# Patient Record
Sex: Female | Born: 1955 | Race: Black or African American | Hispanic: No | Marital: Married | Smoking: Never smoker
Health system: Southern US, Community
[De-identification: ages and names within clinical notes are randomized; demographics above are authoritative.]

## PROBLEM LIST (undated history)

## (undated) DIAGNOSIS — R87619 Unspecified abnormal cytological findings in specimens from cervix uteri: Secondary | ICD-10-CM

## (undated) DIAGNOSIS — M5090 Cervical disc disorder, unspecified, unspecified cervical region: Secondary | ICD-10-CM

## (undated) DIAGNOSIS — I10 Essential (primary) hypertension: Secondary | ICD-10-CM

## (undated) DIAGNOSIS — Z8639 Personal history of other endocrine, nutritional and metabolic disease: Secondary | ICD-10-CM

## (undated) DIAGNOSIS — E559 Vitamin D deficiency, unspecified: Secondary | ICD-10-CM

## (undated) DIAGNOSIS — IMO0002 Reserved for concepts with insufficient information to code with codable children: Secondary | ICD-10-CM

## (undated) DIAGNOSIS — K219 Gastro-esophageal reflux disease without esophagitis: Secondary | ICD-10-CM

## (undated) DIAGNOSIS — G35 Multiple sclerosis: Secondary | ICD-10-CM

## (undated) DIAGNOSIS — J45909 Unspecified asthma, uncomplicated: Secondary | ICD-10-CM

## (undated) DIAGNOSIS — U071 COVID-19: Secondary | ICD-10-CM

## (undated) DIAGNOSIS — Z860100 Personal history of colon polyps, unspecified: Secondary | ICD-10-CM

## (undated) DIAGNOSIS — G35D Multiple sclerosis, unspecified: Secondary | ICD-10-CM

## (undated) HISTORY — DX: COVID-19: U07.1

## (undated) HISTORY — DX: Personal history of colon polyps, unspecified: Z86.0100

## (undated) HISTORY — DX: Vitamin D deficiency, unspecified: E55.9

## (undated) HISTORY — DX: Personal history of colonic polyps: Z86.010

## (undated) HISTORY — DX: Unspecified asthma, uncomplicated: J45.909

## (undated) HISTORY — DX: Essential (primary) hypertension: I10

## (undated) HISTORY — DX: Multiple sclerosis: G35

## (undated) HISTORY — DX: Unspecified abnormal cytological findings in specimens from cervix uteri: R87.619

## (undated) HISTORY — PX: BREAST REDUCTION SURGERY: SHX8

## (undated) HISTORY — DX: Gastro-esophageal reflux disease without esophagitis: K21.9

## (undated) HISTORY — DX: Cervical disc disorder, unspecified, unspecified cervical region: M50.90

## (undated) HISTORY — PX: JOINT REPLACEMENT: SHX530

## (undated) HISTORY — DX: Multiple sclerosis, unspecified: G35.D

## (undated) HISTORY — DX: Personal history of other endocrine, nutritional and metabolic disease: Z86.39

## (undated) HISTORY — DX: Reserved for concepts with insufficient information to code with codable children: IMO0002

---

## 2000-11-20 HISTORY — PX: REDUCTION MAMMAPLASTY: SUR839

## 2001-11-20 HISTORY — PX: TOTAL ABDOMINAL HYSTERECTOMY: SHX209

## 2004-02-26 ENCOUNTER — Encounter: Admission: RE | Admit: 2004-02-26 | Discharge: 2004-02-26 | Payer: Self-pay | Admitting: Internal Medicine

## 2005-01-20 ENCOUNTER — Ambulatory Visit: Payer: Self-pay

## 2005-02-20 ENCOUNTER — Ambulatory Visit: Payer: Self-pay | Admitting: Pain Medicine

## 2005-05-26 ENCOUNTER — Ambulatory Visit: Payer: Self-pay

## 2005-06-28 ENCOUNTER — Ambulatory Visit: Payer: Self-pay

## 2006-01-25 ENCOUNTER — Ambulatory Visit: Payer: Self-pay

## 2006-02-18 ENCOUNTER — Emergency Department: Payer: Self-pay | Admitting: Emergency Medicine

## 2006-05-26 ENCOUNTER — Other Ambulatory Visit: Payer: Self-pay

## 2006-05-26 ENCOUNTER — Emergency Department: Payer: Self-pay | Admitting: Emergency Medicine

## 2006-09-26 ENCOUNTER — Ambulatory Visit: Payer: Self-pay | Admitting: Obstetrics and Gynecology

## 2006-11-03 ENCOUNTER — Inpatient Hospital Stay: Payer: Self-pay | Admitting: Internal Medicine

## 2006-11-23 ENCOUNTER — Ambulatory Visit: Payer: Self-pay

## 2007-10-27 ENCOUNTER — Emergency Department: Payer: Self-pay | Admitting: Emergency Medicine

## 2008-12-30 ENCOUNTER — Ambulatory Visit: Payer: Self-pay | Admitting: Unknown Physician Specialty

## 2009-03-29 LAB — HM PAP SMEAR: HM Pap smear: ABNORMAL

## 2010-02-01 ENCOUNTER — Ambulatory Visit: Payer: Self-pay | Admitting: Unknown Physician Specialty

## 2010-10-22 ENCOUNTER — Ambulatory Visit: Payer: Self-pay | Admitting: Neurology

## 2010-12-31 LAB — HM DEXA SCAN

## 2011-03-03 ENCOUNTER — Observation Stay: Payer: Self-pay | Admitting: *Deleted

## 2011-07-20 ENCOUNTER — Ambulatory Visit: Payer: Self-pay | Admitting: Unknown Physician Specialty

## 2011-07-25 ENCOUNTER — Ambulatory Visit: Payer: Self-pay | Admitting: Unknown Physician Specialty

## 2012-07-09 LAB — TSH: TSH: 1.56 u[IU]/mL (ref <=5.90)

## 2012-07-09 LAB — BASIC METABOLIC PANEL
BUN: 16 mg/dL (ref 4–21)
Creatinine: 0.9 mg/dL (ref <=1.1)
Glucose: 90 mg/dL
Potassium: 4.1 mmol/L (ref 3.4–5.3)
Sodium: 142 mmol/L (ref 137–147)

## 2012-10-03 ENCOUNTER — Telehealth: Payer: Self-pay | Admitting: *Deleted

## 2012-10-03 NOTE — Telephone Encounter (Signed)
Patient has new patient appointment in February 2014 with Dr. Lorin Picket and she requested a new script for Metoprolol XL 25 mg

## 2012-10-03 NOTE — Telephone Encounter (Signed)
Called pharmacy to clarify directions.  Taking Toprol XL 25mg  q day.  Called pharmacy and prescribed #30 with 3 refills.  Please call and notify pt rx has been called in.

## 2012-10-03 NOTE — Telephone Encounter (Signed)
Script faxed to patients pharmacy diovan hct 320-25mg 

## 2012-10-12 ENCOUNTER — Ambulatory Visit: Payer: Self-pay | Admitting: Neurology

## 2012-10-12 LAB — CREATININE, SERUM
Creatinine: 0.82 mg/dL (ref 0.60–1.30)
EGFR (African American): >60
EGFR (Non-African Amer.): >60

## 2012-11-30 ENCOUNTER — Ambulatory Visit: Payer: Self-pay | Admitting: Neurology

## 2012-12-11 ENCOUNTER — Other Ambulatory Visit: Payer: Self-pay | Admitting: Internal Medicine

## 2012-12-11 NOTE — Telephone Encounter (Signed)
Patient has an appointment on 2.7.14   valsartan-hydrochlorothiazide (DIOVAN-HCT) 320-25 MG per tablet

## 2012-12-13 ENCOUNTER — Other Ambulatory Visit: Payer: Self-pay | Admitting: Internal Medicine

## 2012-12-13 MED ORDER — VALSARTAN-HYDROCHLOROTHIAZIDE 320-25 MG PO TABS: 1.0000 | ORAL_TABLET | Freq: Every day | ORAL | Status: DC

## 2012-12-13 NOTE — Telephone Encounter (Signed)
Sent in to pharmacy.  

## 2012-12-16 NOTE — Telephone Encounter (Signed)
Sent in to pharmacy.  

## 2012-12-25 ENCOUNTER — Encounter: Payer: Self-pay | Admitting: Internal Medicine

## 2012-12-25 DIAGNOSIS — E7800 Pure hypercholesterolemia, unspecified: Secondary | ICD-10-CM | POA: Insufficient documentation

## 2012-12-25 DIAGNOSIS — M5137 Other intervertebral disc degeneration, lumbosacral region: Secondary | ICD-10-CM | POA: Insufficient documentation

## 2012-12-25 DIAGNOSIS — I10 Essential (primary) hypertension: Secondary | ICD-10-CM | POA: Insufficient documentation

## 2012-12-25 DIAGNOSIS — E559 Vitamin D deficiency, unspecified: Secondary | ICD-10-CM

## 2012-12-25 DIAGNOSIS — D649 Anemia, unspecified: Secondary | ICD-10-CM | POA: Insufficient documentation

## 2012-12-25 DIAGNOSIS — E785 Hyperlipidemia, unspecified: Secondary | ICD-10-CM

## 2012-12-25 DIAGNOSIS — G35 Multiple sclerosis: Secondary | ICD-10-CM | POA: Insufficient documentation

## 2012-12-27 ENCOUNTER — Ambulatory Visit (INDEPENDENT_AMBULATORY_CARE_PROVIDER_SITE_OTHER): Payer: BC Managed Care – PPO | Admitting: Internal Medicine

## 2012-12-27 ENCOUNTER — Encounter: Payer: Self-pay | Admitting: Internal Medicine

## 2012-12-27 VITALS — BP 120/70 | HR 55 | Temp 99.0°F | Ht >= 80 in | Wt 216.5 lb

## 2012-12-27 DIAGNOSIS — E559 Vitamin D deficiency, unspecified: Secondary | ICD-10-CM

## 2012-12-27 DIAGNOSIS — Z8742 Personal history of other diseases of the female genital tract: Secondary | ICD-10-CM

## 2012-12-27 DIAGNOSIS — D649 Anemia, unspecified: Secondary | ICD-10-CM

## 2012-12-27 DIAGNOSIS — I10 Essential (primary) hypertension: Secondary | ICD-10-CM

## 2012-12-27 DIAGNOSIS — G35 Multiple sclerosis: Secondary | ICD-10-CM

## 2012-12-27 DIAGNOSIS — G35D Multiple sclerosis, unspecified: Secondary | ICD-10-CM

## 2012-12-27 DIAGNOSIS — Z87898 Personal history of other specified conditions: Secondary | ICD-10-CM

## 2012-12-27 DIAGNOSIS — E785 Hyperlipidemia, unspecified: Secondary | ICD-10-CM

## 2012-12-27 DIAGNOSIS — K219 Gastro-esophageal reflux disease without esophagitis: Secondary | ICD-10-CM

## 2012-12-27 MED ORDER — CYCLOBENZAPRINE HCL 5 MG PO TABS: 5.0000 mg | ORAL_TABLET | Freq: Every evening | ORAL | Status: DC | PRN

## 2012-12-29 ENCOUNTER — Encounter: Payer: Self-pay | Admitting: Internal Medicine

## 2012-12-29 DIAGNOSIS — K219 Gastro-esophageal reflux disease without esophagitis: Secondary | ICD-10-CM | POA: Insufficient documentation

## 2012-12-29 DIAGNOSIS — R87619 Unspecified abnormal cytological findings in specimens from cervix uteri: Secondary | ICD-10-CM | POA: Insufficient documentation

## 2012-12-29 NOTE — Progress Notes (Signed)
Subjective:    Patient ID: Jackie White, female    DOB: 12-13-1955, 57 y.o.   MRN: [id number]  HPI 57 year old female with past history of hypertension, hypercholesterolemia, GERD and multiple sclerosis who comes in today for a scheduled follow up.  She wanted to wait until her next visit to do her physical.  She has been seeing Dr Sherryll Burger for her MS.  Had MRI that revealed three lesions on her spine.  She was noticing feeling some unsteadiness at times.  Dr Sherryll Burger discussed changing her treatment regimen.  She declined.  Wants to monitor.   She has been under increased stress with her mother.  She is helping take care of her.  She has noticed some left shoulder tightness and soreness.  Feels like a pulled muscle.  Some tingling down her arm/hand.  Has a pulling sensation - with movement of her arm.  Breathing stable.  Eating and drinking well.  Bowels stable.    Past Medical History  Diagnosis Date  . Multiple sclerosis   . Reactive airway disease   . Hypertension   . Cervical disc disease     Cervical and lumbar disc disease  . Vitamin D deficiency   . History of iron deficiency   . GERD (gastroesophageal reflux disease)   . Abnormal Pap smear   . Personal history of colonic polyps     Current Outpatient Prescriptions on File Prior to Visit  Medication Sig Dispense Refill  . esomeprazole (NEXIUM) 40 MG capsule Take 40 mg by mouth daily.      . Interferon Beta-1b (BETASERON [place]) Inject 0.3 mcg into the skin every other day.      . valsartan-hydrochlorothiazide (DIOVAN-HCT) 320-25 MG per tablet TAKE 1 TABLET BY MOUTH EVERY DAY  30 tablet  0   No current facility-administered medications on file prior to visit.    Review of Systems Patient denies any headache, lightheadedness or dizziness currently.  No sinus or allergy symptoms. No chest pain, tightness or palpitations.  No increased shortness of breath, cough or congestion.  No nausea or vomiting.  No acid reflux.  No abdominal pain or  cramping.  No bowel change, such as diarrhea, constipation, BRBPR or melana.  No urine change.  Some increased pulling in her left shoulder with rotation of her head - right to left.  No motor weakness.         Objective:   Physical Exam Filed Vitals:   12/27/12 1409  BP: 120/70  Pulse: 55  Temp: 99 F (37.2 C)   Pulse 3  57 year old female in no acute distress.   HEENT:  Nares- clear.  Oropharynx - without lesions. NECK:  Supple.  Nontender.  No audible bruit.  HEART:  Appears to be regular. LUNGS:  No crackles or wheezing audible.  Respirations even and unlabored.  RADIAL PULSE:  Equal bilaterally.   ABDOMEN:  Soft, nontender.  Bowel sounds present and normal.  No audible abdominal bruit.    EXTREMITIES:  No increased edema present.  DP pulses palpable and equal bilaterally.  MSK:  Increased discomfort with full extension of her left arm.  Increased discomfort with rotation of there head from left to right.           Assessment & Plan:  MSK.  Left shoulder pain and tingling as outlined.  Treat conservatively.  Stretches.  Flexeril 5mg  q hs prn.  Follow closely.  If any change or problems, needs reevaluation.  HEALTH MAINTENANCE.  Schedule a physical next visit.  Schedule mammogram.  Colonoscopy 11/20/02.

## 2012-12-29 NOTE — Assessment & Plan Note (Signed)
Seeing Dr Sherryll Burger.  See above and his note for details.  Wants to monitor.  Follow.  Continue to follow up with Dr Sherryll Burger.

## 2012-12-29 NOTE — Assessment & Plan Note (Signed)
History of iron deficient anemia.  Check cbc.

## 2012-12-29 NOTE — Assessment & Plan Note (Signed)
States she is up to date.  Will schedule her for a physical next visit.

## 2012-12-29 NOTE — Assessment & Plan Note (Signed)
Check vitamin D level 

## 2012-12-29 NOTE — Assessment & Plan Note (Signed)
Low cholesterol diet and exercise.  Check lipid panel.   

## 2012-12-29 NOTE — Assessment & Plan Note (Signed)
Blood pressure has been under good control.  Same medication regimen.  Follow.  Check metabolic panel.

## 2012-12-29 NOTE — Assessment & Plan Note (Signed)
Reflux controlled.  Follow.   

## 2013-01-08 ENCOUNTER — Telehealth: Payer: Self-pay | Admitting: Internal Medicine

## 2013-01-08 NOTE — Telephone Encounter (Signed)
Dr Lorin Picket When pt was checking out she mention she needed a mammogram.  We need order in system  Thanks  Amber pt would like to go to [place] in pm

## 2013-01-09 NOTE — Telephone Encounter (Signed)
Order placed for her mammogram.

## 2013-02-05 ENCOUNTER — Ambulatory Visit: Payer: Self-pay | Admitting: Internal Medicine

## 2013-02-08 ENCOUNTER — Encounter: Payer: Self-pay | Admitting: Internal Medicine

## 2013-02-12 ENCOUNTER — Other Ambulatory Visit: Payer: Self-pay | Admitting: *Deleted

## 2013-02-17 MED ORDER — VALSARTAN-HYDROCHLOROTHIAZIDE 320-25 MG PO TABS: 1.0000 | ORAL_TABLET | Freq: Every day | ORAL | Status: DC

## 2013-02-17 NOTE — Telephone Encounter (Signed)
Rx sent in to pharmacy. 

## 2013-02-25 ENCOUNTER — Encounter: Payer: Self-pay | Admitting: Internal Medicine

## 2013-03-21 ENCOUNTER — Other Ambulatory Visit: Payer: Self-pay | Admitting: *Deleted

## 2013-03-21 MED ORDER — VALSARTAN-HYDROCHLOROTHIAZIDE 320-25 MG PO TABS: 1.0000 | ORAL_TABLET | Freq: Every day | ORAL | Status: DC

## 2013-03-21 NOTE — Telephone Encounter (Signed)
[facility] left voicemail, requesting refills to be sent. Rx sent to pharmacy by escript

## 2013-04-04 ENCOUNTER — Encounter: Payer: Self-pay | Admitting: Internal Medicine

## 2013-04-04 ENCOUNTER — Other Ambulatory Visit (HOSPITAL_COMMUNITY)
Admission: RE | Admit: 2013-04-04 | Discharge: 2013-04-04 | Disposition: A | Discharge status: Home / Self Care | Payer: BC Managed Care – PPO | Source: Ambulatory Visit | Attending: Internal Medicine | Admitting: Internal Medicine

## 2013-04-04 ENCOUNTER — Ambulatory Visit (INDEPENDENT_AMBULATORY_CARE_PROVIDER_SITE_OTHER): Payer: BC Managed Care – PPO | Admitting: Internal Medicine

## 2013-04-04 VITALS — BP 100/70 | HR 76 | Temp 99.2°F | Ht 67.1 in | Wt 213.8 lb

## 2013-04-04 DIAGNOSIS — E785 Hyperlipidemia, unspecified: Secondary | ICD-10-CM

## 2013-04-04 DIAGNOSIS — Z1151 Encounter for screening for human papillomavirus (HPV): Secondary | ICD-10-CM | POA: Insufficient documentation

## 2013-04-04 DIAGNOSIS — D649 Anemia, unspecified: Secondary | ICD-10-CM

## 2013-04-04 DIAGNOSIS — Z124 Encounter for screening for malignant neoplasm of cervix: Secondary | ICD-10-CM

## 2013-04-04 DIAGNOSIS — Z87898 Personal history of other specified conditions: Secondary | ICD-10-CM

## 2013-04-04 DIAGNOSIS — G35 Multiple sclerosis: Secondary | ICD-10-CM

## 2013-04-04 DIAGNOSIS — E559 Vitamin D deficiency, unspecified: Secondary | ICD-10-CM

## 2013-04-04 DIAGNOSIS — Z8742 Personal history of other diseases of the female genital tract: Secondary | ICD-10-CM

## 2013-04-04 DIAGNOSIS — K219 Gastro-esophageal reflux disease without esophagitis: Secondary | ICD-10-CM

## 2013-04-04 DIAGNOSIS — Z1211 Encounter for screening for malignant neoplasm of colon: Secondary | ICD-10-CM

## 2013-04-04 DIAGNOSIS — Z01419 Encounter for gynecological examination (general) (routine) without abnormal findings: Secondary | ICD-10-CM | POA: Insufficient documentation

## 2013-04-04 DIAGNOSIS — I10 Essential (primary) hypertension: Secondary | ICD-10-CM

## 2013-04-04 LAB — HEPATIC FUNCTION PANEL
Albumin: 3.8 g/dL (ref 3.5–5.2)
Bilirubin, Direct: 0 mg/dL (ref 0.0–0.3)
Total Protein: 6.9 g/dL (ref 6.0–8.3)

## 2013-04-04 LAB — BASIC METABOLIC PANEL
BUN: 13 mg/dL (ref 6–23)
CO2: 32 mEq/L (ref 19–32)
Calcium: 9.5 mg/dL (ref 8.4–10.5)
Chloride: 100 mEq/L (ref 96–112)
Creatinine, Ser: 0.8 mg/dL (ref 0.4–1.2)
Glucose, Bld: 87 mg/dL (ref 70–99)
Potassium: 3.8 mEq/L (ref 3.5–5.1)

## 2013-04-04 LAB — CBC WITH DIFFERENTIAL/PLATELET
Basophils Relative: 0.6 % (ref 0.0–3.0)
Eosinophils Relative: 1.5 % (ref 0.0–5.0)
HCT: 40.5 % (ref 36.0–46.0)
Lymphocytes Relative: 24.8 % (ref 12.0–46.0)
Monocytes Relative: 15.6 % — ABNORMAL HIGH (ref 3.0–12.0)
Neutro Abs: 2.3 10*3/uL (ref 1.4–7.7)
Neutrophils Relative %: 57.5 % (ref 43.0–77.0)
Platelets: 257 10*3/uL (ref 150.0–400.0)
RDW: 13.8 % (ref 11.5–14.6)

## 2013-04-04 MED ORDER — ESOMEPRAZOLE MAGNESIUM 40 MG PO CPDR: 40.0000 mg | DELAYED_RELEASE_CAPSULE | Freq: Every day | ORAL | Status: DC

## 2013-04-04 NOTE — Progress Notes (Signed)
Subjective:    Patient ID: Jackie White, female    DOB: [birth year], 57 y.o.   MRN: [id number]  HPI 57 year old female with past history of hypertension, hypercholesterolemia, GERD and multiple sclerosis who comes in today to follow up on these issues as well as for a complete physical exam.  She has been seeing Dr Sherryll Burger for her MS.  Had MRI that revealed three lesions on her spine.  She was just reevaluated in 2/14.  States everything stable.  She has been under increased stress with her mother.  She is helping take care of her.  Feels she is handling things relatively well.  Nexium controlling her reflux.   Breathing stable.  Eating and drinking well.  Bowels stable.    Past Medical History  Diagnosis Date  . Multiple sclerosis   . Reactive airway disease   . Hypertension   . Cervical disc disease     Cervical and lumbar disc disease  . Vitamin D deficiency   . History of iron deficiency   . GERD (gastroesophageal reflux disease)   . Abnormal Pap smear   . Personal history of colonic polyps     Current Outpatient Prescriptions on File Prior to Visit  Medication Sig Dispense Refill  . esomeprazole (NEXIUM) 40 MG capsule Take 40 mg by mouth daily.      . Interferon Beta-1b (BETASERON [place]) Inject 0.3 mcg into the skin every other day.      . metoprolol succinate (TOPROL-XL) 25 MG 24 hr tablet Take 25 mg by mouth daily.      . valsartan-hydrochlorothiazide (DIOVAN-HCT) 320-25 MG per tablet Take 1 tablet by mouth daily.  30 tablet  5   No current facility-administered medications on file prior to visit.    Review of Systems Patient denies any headache, lightheadedness or dizziness currently.  No sinus or allergy symptoms. No chest pain, tightness or palpitations.  No increased shortness of breath, cough or congestion.  No nausea or vomiting.  No acid reflux.  Controlled on nexium. No abdominal pain or cramping.  No bowel change, such as diarrhea, constipation, BRBPR or melana.  No urine  change.  Taking vitamin D supplementation.          Objective:   Physical Exam  Filed Vitals:   04/04/13 0827  BP: 100/70  Pulse: 76  Temp: 99.2 F (37.3 C)   Pulse 64, blood pressure recheck:  77/83-4  57 year old female in no acute distress.   HEENT:  Nares- clear.  Oropharynx - without lesions. NECK:  Supple.  Nontender.  No audible bruit.  HEART:  Appears to be regular. LUNGS:  No crackles or wheezing audible.  Respirations even and unlabored.  RADIAL PULSE:  Equal bilaterally.    BREASTS:  No nipple discharge or nipple retraction present.  Could not appreciate any distinct nodules or axillary adenopathy.  ABDOMEN:  Soft, nontender.  Bowel sounds present and normal.  No audible abdominal bruit.  GU:  Normal external genitalia.  Vaginal vault without lesions.  S/p hysterectomy.  Pap performed. Could not appreciate any adnexal masses or tenderness.   RECTAL:  Heme negative.   EXTREMITIES:  No increased edema present.  DP pulses palpable and equal bilaterally.          Assessment & Plan:  MSK.  Stable.    HEALTH MAINTENANCE.  Physical today.  Mammogram 02/05/13 - Birads I.   Colonoscopy 11/20/02.  Refer to GI for screening colonoscopy.  Due.

## 2013-04-06 ENCOUNTER — Encounter: Payer: Self-pay | Admitting: Internal Medicine

## 2013-04-06 NOTE — Assessment & Plan Note (Signed)
Blood pressure has been under good control.  Same medication regimen.  Follow.  Follow metabolic panel.   

## 2013-04-06 NOTE — Assessment & Plan Note (Signed)
Low cholesterol diet and exercise.  Follow lipid panel.   

## 2013-04-06 NOTE — Assessment & Plan Note (Addendum)
Reflux controlled on nexium.  Follow.   

## 2013-04-06 NOTE — Assessment & Plan Note (Addendum)
Seeing Dr Sherryll Burger.  See above and his note for details.  Follow.  Continue to follow up with Dr Sherryll Burger.  She feels things are stable.

## 2013-04-06 NOTE — Assessment & Plan Note (Signed)
Physical today.  Pap performed.

## 2013-04-06 NOTE — Assessment & Plan Note (Signed)
On vitamin D supplements.  Follow.   

## 2013-04-06 NOTE — Assessment & Plan Note (Signed)
History of iron deficient anemia.  Check cbc.

## 2013-04-08 ENCOUNTER — Encounter: Payer: Self-pay | Admitting: Internal Medicine

## 2013-04-09 ENCOUNTER — Telehealth: Payer: Self-pay | Admitting: Internal Medicine

## 2013-04-09 NOTE — Telephone Encounter (Signed)
Pt sent me a message wanting to be seen next week - early am.  See if she can come in on 04/18/13 at 8:15.

## 2013-04-09 NOTE — Telephone Encounter (Signed)
Received Prior Authorization Criteria question for nexium, put in Dr. Roby Lofts folder

## 2013-04-09 NOTE — Telephone Encounter (Signed)
Called [phone] for Prior Authorization on Nexium 40mg , form is being faxed over  Case number: [id number]

## 2013-04-09 NOTE — Telephone Encounter (Signed)
Left message for pt to call office

## 2013-04-10 NOTE — Telephone Encounter (Signed)
Left message for pt to call office on home phone.  Cell phone had different name didn't leave message there

## 2013-04-18 ENCOUNTER — Encounter: Payer: Self-pay | Admitting: Internal Medicine

## 2013-04-18 ENCOUNTER — Ambulatory Visit (INDEPENDENT_AMBULATORY_CARE_PROVIDER_SITE_OTHER): Payer: BC Managed Care – PPO | Admitting: Internal Medicine

## 2013-04-18 VITALS — BP 120/76 | HR 60 | Temp 98.7°F | Ht 67.1 in | Wt 218.5 lb

## 2013-04-18 DIAGNOSIS — L989 Disorder of the skin and subcutaneous tissue, unspecified: Secondary | ICD-10-CM

## 2013-04-18 DIAGNOSIS — K219 Gastro-esophageal reflux disease without esophagitis: Secondary | ICD-10-CM

## 2013-04-18 NOTE — Progress Notes (Signed)
  Subjective:    Patient ID: Jackie White, female    DOB: 1956/06/25, 57 y.o.   MRN: [id number]  HPI 57 year old female with past history of hypertension, hypercholesterolemia, GERD and multiple sclerosis who comes in today as a work in with concerns regarding a persistent arm lesion.  Has been present for at least two months.  Raised.  Was itching.  She applied hydrocortisone.  Helped the itching.  She has multiple small areas of pigment change, but this area is raised.  No longer itching or inflammed.     Past Medical History  Diagnosis Date  . Multiple sclerosis   . Reactive airway disease   . Hypertension   . Cervical disc disease     Cervical and lumbar disc disease  . Vitamin D deficiency   . History of iron deficiency   . GERD (gastroesophageal reflux disease)   . Abnormal Pap smear   . Personal history of colonic polyps     Current Outpatient Prescriptions on File Prior to Visit  Medication Sig Dispense Refill  . Interferon Beta-1b (BETASERON [place]) Inject 0.3 mcg into the skin every other day.      . metoprolol succinate (TOPROL-XL) 25 MG 24 hr tablet Take 25 mg by mouth daily.      . valsartan-hydrochlorothiazide (DIOVAN-HCT) 320-25 MG per tablet Take 1 tablet by mouth daily.  30 tablet  5  . esomeprazole (NEXIUM) 40 MG capsule Take 1 capsule (40 mg total) by mouth daily.  90 capsule  3   No current facility-administered medications on file prior to visit.    Review of Systems Arm lesion as outlined.  Persistent.  No significant itching or inflammation today.  No increased redness.  No pain.  Had some questions about her nexium - prior authorization.         Objective:   Physical Exam  Filed Vitals:   04/18/13 0809  BP: 120/76  Pulse: 60  Temp: 98.7 F (53.29 C)   57 year old female in no acute distress.  NECK:  Supple.  Nontender.  HEART:  Appears to be regular. LUNGS:  No crackles or wheezing audible.  Respirations even and unlabored.    SKIN:  Small circular  raised lesion - right upper arm.  Non tender.  No increased erythema or warmth.         Assessment & Plan:  DERMATOLOGY.  Persistent right arm lesion.  Refer to dermatology for evaluation.   HEALTH MAINTENANCE.  Physical last visit.  Mammogram 02/05/13 - Birads I.   Colonoscopy 11/20/02.  Referred to GI for screening colonoscopy.

## 2013-04-20 ENCOUNTER — Encounter: Payer: Self-pay | Admitting: Internal Medicine

## 2013-04-20 NOTE — Assessment & Plan Note (Signed)
Reflux controlled on nexium.  Has tried prilosec previously.  Did not work as well.  Continue on nexium since symptoms controlled.   

## 2013-04-22 ENCOUNTER — Encounter: Payer: Self-pay | Admitting: Emergency Medicine

## 2013-04-24 ENCOUNTER — Telehealth: Payer: Self-pay | Admitting: *Deleted

## 2013-04-24 NOTE — Telephone Encounter (Signed)
error 

## 2013-05-08 ENCOUNTER — Telehealth: Payer: Self-pay | Admitting: *Deleted

## 2013-05-08 ENCOUNTER — Encounter: Payer: Self-pay | Admitting: Internal Medicine

## 2013-05-08 MED ORDER — ESOMEPRAZOLE MAGNESIUM 20 MG PO PACK: 20.0000 mg | PACK | Freq: Every day | ORAL | Status: DC

## 2013-05-08 NOTE — Telephone Encounter (Signed)
Rx received letter that insurance will now cover the Nexium OTC #42 with a written RX at a cost of only $5. Pt would like to pick up RX (in your folder)

## 2013-05-12 NOTE — Telephone Encounter (Signed)
Rx placed up front-left message on home # to notify pt

## 2013-05-12 NOTE — Telephone Encounter (Signed)
rx signed and in your box.

## 2013-07-10 ENCOUNTER — Other Ambulatory Visit: Payer: Self-pay | Admitting: Internal Medicine

## 2013-09-05 ENCOUNTER — Ambulatory Visit: Payer: Self-pay | Admitting: Gastroenterology

## 2013-09-05 LAB — HM COLONOSCOPY

## 2013-09-17 ENCOUNTER — Encounter: Payer: Self-pay | Admitting: Internal Medicine

## 2013-09-19 ENCOUNTER — Other Ambulatory Visit: Payer: Self-pay | Admitting: Internal Medicine

## 2013-09-25 ENCOUNTER — Other Ambulatory Visit: Payer: Self-pay

## 2013-10-08 ENCOUNTER — Encounter: Payer: Self-pay | Admitting: Internal Medicine

## 2013-10-08 ENCOUNTER — Ambulatory Visit (INDEPENDENT_AMBULATORY_CARE_PROVIDER_SITE_OTHER): Payer: BC Managed Care – PPO | Admitting: Internal Medicine

## 2013-10-08 ENCOUNTER — Ambulatory Visit (INDEPENDENT_AMBULATORY_CARE_PROVIDER_SITE_OTHER)
Admission: RE | Admit: 2013-10-08 | Discharge: 2013-10-08 | Disposition: A | Discharge status: Home / Self Care | Payer: BC Managed Care – PPO | Source: Ambulatory Visit | Attending: Internal Medicine | Admitting: Internal Medicine

## 2013-10-08 VITALS — BP 110/80 | HR 59 | Temp 98.6°F | Ht 67.1 in | Wt 216.0 lb

## 2013-10-08 DIAGNOSIS — M25522 Pain in left elbow: Secondary | ICD-10-CM

## 2013-10-08 DIAGNOSIS — M25562 Pain in left knee: Secondary | ICD-10-CM

## 2013-10-08 DIAGNOSIS — Z13220 Encounter for screening for lipoid disorders: Secondary | ICD-10-CM

## 2013-10-08 DIAGNOSIS — Z87898 Personal history of other specified conditions: Secondary | ICD-10-CM

## 2013-10-08 DIAGNOSIS — I10 Essential (primary) hypertension: Secondary | ICD-10-CM

## 2013-10-08 DIAGNOSIS — M25529 Pain in unspecified elbow: Secondary | ICD-10-CM

## 2013-10-08 DIAGNOSIS — K219 Gastro-esophageal reflux disease without esophagitis: Secondary | ICD-10-CM

## 2013-10-08 DIAGNOSIS — M25569 Pain in unspecified knee: Secondary | ICD-10-CM

## 2013-10-08 DIAGNOSIS — E785 Hyperlipidemia, unspecified: Secondary | ICD-10-CM

## 2013-10-08 DIAGNOSIS — E559 Vitamin D deficiency, unspecified: Secondary | ICD-10-CM

## 2013-10-08 DIAGNOSIS — Z8742 Personal history of other diseases of the female genital tract: Secondary | ICD-10-CM

## 2013-10-08 DIAGNOSIS — D649 Anemia, unspecified: Secondary | ICD-10-CM

## 2013-10-08 DIAGNOSIS — G35 Multiple sclerosis: Secondary | ICD-10-CM

## 2013-10-08 LAB — LIPID PANEL
Cholesterol: 157 mg/dL (ref 0–200)
LDL Cholesterol: 82 mg/dL (ref 0–99)
Triglycerides: 73 mg/dL (ref 0.0–149.0)
VLDL: 14.6 mg/dL (ref 0.0–40.0)

## 2013-10-08 LAB — COMPREHENSIVE METABOLIC PANEL
ALT: 17 U/L (ref 0–35)
AST: 18 U/L (ref 0–37)
Alkaline Phosphatase: 70 U/L (ref 39–117)
CO2: 29 mEq/L (ref 19–32)
Creatinine, Ser: 0.8 mg/dL (ref 0.4–1.2)
Glucose, Bld: 91 mg/dL (ref 70–99)
Sodium: 138 mEq/L (ref 135–145)
Total Bilirubin: 1 mg/dL (ref 0.3–1.2)

## 2013-10-08 LAB — CBC WITH DIFFERENTIAL/PLATELET
Basophils Absolute: 0 10*3/uL (ref 0.0–0.1)
HCT: 40.9 % (ref 36.0–46.0)
Hemoglobin: 13.5 g/dL (ref 12.0–15.0)
Lymphocytes Relative: 22 % (ref 12.0–46.0)
Lymphs Abs: 0.8 10*3/uL (ref 0.7–4.0)
MCV: 83 fl (ref 78.0–100.0)
Monocytes Absolute: 0.7 10*3/uL (ref 0.1–1.0)
Neutro Abs: 2 10*3/uL (ref 1.4–7.7)
WBC: 3.6 10*3/uL — ABNORMAL LOW (ref 4.5–10.5)

## 2013-10-08 NOTE — Progress Notes (Signed)
Subjective:    Patient ID: Jackie White, female    DOB: [birth year], 57 y.o.   MRN: [id number]  HPI 57 year old female with past history of hypertension, hypercholesterolemia, GERD and multiple sclerosis who comes in today for a scheduled follow up.  She reports she previously had trouble with left knee pain.  Had previous arthroscopy and found to have a torn meniscus.  Had done relatively well.  Started working out.  States that starting a few weeks ago she developed increased pain in her knee.  She has been icing and elevating the knee.  Feels stiff.  Increased pressure with walking.  Taking some advil and alleve.  Helps some.  She also reports some increased left elbow soreness.  Stiff in the am.  No cardiac symptoms with increased activity or exertion.  Breathing doing well.  Eating and drinking well.  Bowels stable.       Past Medical History  Diagnosis Date  . Multiple sclerosis   . Reactive airway disease   . Hypertension   . Cervical disc disease     Cervical and lumbar disc disease  . Vitamin D deficiency   . History of iron deficiency   . GERD (gastroesophageal reflux disease)   . Abnormal Pap smear   . Personal history of colonic polyps     Current Outpatient Prescriptions on File Prior to Visit  Medication Sig Dispense Refill  . esomeprazole (NEXIUM) 20 MG packet Take 20 mg by mouth daily before breakfast.  42 each  12  . Interferon Beta-1b (BETASERON [place]) Inject 0.3 mcg into the skin every other day.      . metoprolol succinate (TOPROL-XL) 25 MG 24 hr tablet TAKE 1 TABLET BY MOUTH DAILY  90 tablet  1  . valsartan-hydrochlorothiazide (DIOVAN-HCT) 320-25 MG per tablet TAKE 1 TABLET BY MOUTH EVERY DAY  30 tablet  5   No current facility-administered medications on file prior to visit.    Review of Systems Patient denies any headache, lightheadedness or dizziness.  No sinus or allergy symptoms.  No chest pain, tightness or palpitations.  No increased shortness of breath,  cough or congestion.  No nausea or vomiting.  No abdominal pain or cramping.  No bowel change, such as diarrhea, constipation, BRBPR or melana.  No urine change.  Left knee pain as outlined.           Objective:   Physical Exam  Filed Vitals:   10/08/13 0830  BP: 110/80  Pulse: 59  Temp: 98.6 F (61 C)   57 year old female in no acute distress.   HEENT:  Nares- clear.  Oropharynx - without lesions. NECK:  Supple.  Nontender.  No audible bruit.  HEART:  Appears to be regular. LUNGS:  No crackles or wheezing audible.  Respirations even and unlabored.  RADIAL PULSE:  Equal bilaterally.   ABDOMEN:  Soft, nontender.  Bowel sounds present and normal.  No audible abdominal bruit.   EXTREMITIES:  No increased edema present.  DP pulses palpable and equal bilaterally.   MSK:  Increased pain - left elbow with rotation of her forearm.  Increased pain to palpation just under the patella and bilateral - adjacent to the patella.  No increased soft tissue swelling and erythema.             Assessment & Plan:  HEALTH MAINTENANCE.  Physical 04/04/13.   Pap 04/04/13 - negative with negative HPV.  Mammogram 02/05/13 - Birads I.   Colonoscopy  11/20/02.  Referred to GI for screening colonoscopy.

## 2013-10-08 NOTE — Progress Notes (Signed)
Pre-visit discussion using our clinic review tool. No additional management support is needed unless otherwise documented below in the visit note.  

## 2013-10-09 ENCOUNTER — Telehealth: Payer: Self-pay | Admitting: Internal Medicine

## 2013-10-09 ENCOUNTER — Encounter: Payer: Self-pay | Admitting: Internal Medicine

## 2013-10-09 DIAGNOSIS — M25551 Pain in right hip: Secondary | ICD-10-CM

## 2013-10-09 DIAGNOSIS — D72819 Decreased white blood cell count, unspecified: Secondary | ICD-10-CM

## 2013-10-09 LAB — VITAMIN D 25 HYDROXY (VIT D DEFICIENCY, FRACTURES): Vit D, 25-Hydroxy: 43 ng/mL (ref 30–89)

## 2013-10-09 NOTE — Telephone Encounter (Signed)
Pt notified of lab results via my chart.  Needs a f/u non fasting lab in 4 weeks.  Please schedule the lab appt and contact her with an appt date and time.  Thanks.

## 2013-10-09 NOTE — Telephone Encounter (Signed)
Order placed for referral to physical therapy.  

## 2013-10-10 NOTE — Telephone Encounter (Signed)
Left message for pt to call office

## 2013-10-12 ENCOUNTER — Encounter: Payer: Self-pay | Admitting: Internal Medicine

## 2013-10-12 DIAGNOSIS — M25569 Pain in unspecified knee: Secondary | ICD-10-CM | POA: Insufficient documentation

## 2013-10-12 DIAGNOSIS — M25562 Pain in left knee: Secondary | ICD-10-CM | POA: Insufficient documentation

## 2013-10-12 NOTE — Assessment & Plan Note (Signed)
Knee pain and exam as outlined.  Check xray.  Further w/up pending.  Taking antiinflammatories.  Helping some.

## 2013-10-12 NOTE — Assessment & Plan Note (Signed)
Low cholesterol diet and exercise.  Follow lipid panel.   

## 2013-10-12 NOTE — Assessment & Plan Note (Signed)
Blood pressure has been under good control.  Same medication regimen.  Follow.  Follow metabolic panel.   

## 2013-10-12 NOTE — Assessment & Plan Note (Signed)
Elbow pain as outlined.  Taking antiinflammatories.  Use elbow strap.  Follow.  Will notify me if persistent problems.  May need injection.

## 2013-10-12 NOTE — Assessment & Plan Note (Signed)
On vitamin D supplements.  Follow.   

## 2013-10-12 NOTE — Assessment & Plan Note (Signed)
Seeing Dr Shah.  See his note for details.  Follow.  Continue to follow up with Dr Shah.  She feels things are stable.   

## 2013-10-12 NOTE — Assessment & Plan Note (Signed)
History of iron deficient anemia.  Follow cbc.   

## 2013-10-12 NOTE — Assessment & Plan Note (Signed)
Reflux controlled on nexium.  Has tried prilosec previously.  Did not work as well.  Continue on nexium since symptoms controlled.   

## 2013-10-12 NOTE — Assessment & Plan Note (Addendum)
Physical 04/04/13.  Pap negative with negative HPV.

## 2013-10-14 NOTE — Telephone Encounter (Signed)
apointment 1/5 pt aware

## 2013-10-20 ENCOUNTER — Encounter: Payer: Self-pay | Admitting: Emergency Medicine

## 2013-11-24 ENCOUNTER — Other Ambulatory Visit (INDEPENDENT_AMBULATORY_CARE_PROVIDER_SITE_OTHER): Payer: BC Managed Care – PPO

## 2013-11-24 DIAGNOSIS — D72819 Decreased white blood cell count, unspecified: Secondary | ICD-10-CM

## 2013-11-24 LAB — CBC WITH DIFFERENTIAL/PLATELET
Basophils Absolute: 0 10*3/uL (ref 0.0–0.1)
Basophils Relative: 0.6 % (ref 0.0–3.0)
EOS PCT: 3 % (ref 0.0–5.0)
Eosinophils Absolute: 0.1 10*3/uL (ref 0.0–0.7)
HCT: 37.5 % (ref 36.0–46.0)
Hemoglobin: 12.2 g/dL (ref 12.0–15.0)
Lymphocytes Relative: 36.5 % (ref 12.0–46.0)
Lymphs Abs: 1.3 10*3/uL (ref 0.7–4.0)
MCHC: 32.7 g/dL (ref 30.0–36.0)
MCV: 84 fl (ref 78.0–100.0)
MONO ABS: 0.5 10*3/uL (ref 0.1–1.0)
MONOS PCT: 14.3 % — AB (ref 3.0–12.0)
NEUTROS PCT: 45.6 % (ref 43.0–77.0)
Neutro Abs: 1.7 10*3/uL (ref 1.4–7.7)
Platelets: 264 10*3/uL (ref 150.0–400.0)
RBC: 4.47 Mil/uL (ref 3.87–5.11)
RDW: 14.6 % (ref 11.5–14.6)
WBC: 3.6 10*3/uL — AB (ref 4.5–10.5)

## 2013-11-25 ENCOUNTER — Encounter: Payer: Self-pay | Admitting: Internal Medicine

## 2013-12-27 ENCOUNTER — Other Ambulatory Visit: Payer: Self-pay | Admitting: Internal Medicine

## 2014-01-13 ENCOUNTER — Ambulatory Visit: Payer: BC Managed Care – PPO | Admitting: Internal Medicine

## 2014-02-16 ENCOUNTER — Encounter: Payer: Self-pay | Admitting: Internal Medicine

## 2014-02-16 ENCOUNTER — Ambulatory Visit (INDEPENDENT_AMBULATORY_CARE_PROVIDER_SITE_OTHER): Payer: BC Managed Care – PPO | Admitting: Internal Medicine

## 2014-02-16 VITALS — BP 126/80 | HR 60 | Temp 98.2°F | Resp 16 | Ht 67.1 in | Wt 215.0 lb

## 2014-02-16 DIAGNOSIS — Z1211 Encounter for screening for malignant neoplasm of colon: Secondary | ICD-10-CM

## 2014-02-16 DIAGNOSIS — I10 Essential (primary) hypertension: Secondary | ICD-10-CM

## 2014-02-16 DIAGNOSIS — D649 Anemia, unspecified: Secondary | ICD-10-CM

## 2014-02-16 DIAGNOSIS — Z1239 Encounter for other screening for malignant neoplasm of breast: Secondary | ICD-10-CM

## 2014-02-16 DIAGNOSIS — G35 Multiple sclerosis: Secondary | ICD-10-CM

## 2014-02-16 DIAGNOSIS — E785 Hyperlipidemia, unspecified: Secondary | ICD-10-CM

## 2014-02-16 DIAGNOSIS — K219 Gastro-esophageal reflux disease without esophagitis: Secondary | ICD-10-CM

## 2014-02-16 DIAGNOSIS — E559 Vitamin D deficiency, unspecified: Secondary | ICD-10-CM

## 2014-02-16 NOTE — Progress Notes (Signed)
  Subjective:    Patient ID: Jackie White, female    DOB: 1956-05-10, 58 y.o.   MRN: [id number]  HPI 58 year old female with past history of hypertension, hypercholesterolemia, GERD and multiple sclerosis who comes in today for a scheduled follow up.  No cardiac symptoms with increased activity or exertion.  Trying to stay active.  Breathing doing well.  Eating and drinking well.  Bowels stable.  Sees Dr Sherryll Burger for her MS.  Felt things were stable.  Recommended f/u in one year.  Overall she feels she is doing well.     Past Medical History  Diagnosis Date  . Multiple sclerosis   . Reactive airway disease   . Hypertension   . Cervical disc disease     Cervical and lumbar disc disease  . Vitamin D deficiency   . History of iron deficiency   . GERD (gastroesophageal reflux disease)   . Abnormal Pap smear   . Personal history of colonic polyps     Current Outpatient Prescriptions on File Prior to Visit  Medication Sig Dispense Refill  . esomeprazole (NEXIUM) 20 MG packet Take 20 mg by mouth daily before breakfast.  42 each  12  . Interferon Beta-1b (BETASERON [place]) Inject 0.3 mcg into the skin every other day.      . metoprolol succinate (TOPROL-XL) 25 MG 24 hr tablet TAKE 1 TABLET BY MOUTH EVERY DAY  90 tablet  0  . valsartan-hydrochlorothiazide (DIOVAN-HCT) 320-25 MG per tablet TAKE 1 TABLET BY MOUTH EVERY DAY  30 tablet  5   No current facility-administered medications on file prior to visit.    Review of Systems Patient denies any headache, lightheadedness or dizziness.  No sinus or allergy symptoms.  No chest pain, tightness or palpitations.  No increased shortness of breath, cough or congestion.  No nausea or vomiting.  No abdominal pain or cramping.  No bowel change, such as diarrhea, constipation, BRBPR or melana.  No urine change.  States due for a colonoscopy.             Objective:   Physical Exam  Filed Vitals:   02/16/14 1102  BP: 126/80  Pulse: 60  Temp: 98.2 F (36.8  C)  Resp: 88   58 year old female in no acute distress.   HEENT:  Nares- clear.  Oropharynx - without lesions. NECK:  Supple.  Nontender.  No audible bruit.  HEART:  Appears to be regular. LUNGS:  No crackles or wheezing audible.  Respirations even and unlabored.  RADIAL PULSE:  Equal bilaterally.   ABDOMEN:  Soft, nontender.  Bowel sounds present and normal.  No audible abdominal bruit.   EXTREMITIES:  No increased edema present.  DP pulses palpable and equal bilaterally.            Assessment & Plan:  HEALTH MAINTENANCE.  Physical 04/04/13.   Pap 04/04/13 - negative with negative HPV.  Mammogram 02/05/13 - Birads I. Schedule a f/u mammogram.   Colonoscopy 11/20/02.  Refer to GI for screening colonoscopy.  Wants to see Dr Bluford Kaufmann.

## 2014-02-16 NOTE — Progress Notes (Signed)
Pre visit review using our clinic review tool, if applicable. No additional management support is needed unless otherwise documented below in the visit note. 

## 2014-02-20 ENCOUNTER — Encounter: Payer: Self-pay | Admitting: Internal Medicine

## 2014-02-20 NOTE — Assessment & Plan Note (Signed)
Physical 04/04/13.  Pap negative with negative HPV.  Sees Dr Dorma Russell.

## 2014-02-20 NOTE — Assessment & Plan Note (Signed)
Blood pressure has been under good control.  Same medication regimen.  Follow.  Follow metabolic panel.   

## 2014-02-20 NOTE — Assessment & Plan Note (Signed)
On vitamin D supplements.  Follow.   

## 2014-02-20 NOTE — Assessment & Plan Note (Signed)
Seeing Dr Sherryll Burger.  See his note for details.  Follow.  Continue to follow up with Dr Sherryll Burger.  She feels things are stable.

## 2014-02-20 NOTE — Assessment & Plan Note (Signed)
History of iron deficient anemia.  Follow cbc.

## 2014-02-20 NOTE — Assessment & Plan Note (Signed)
Low cholesterol diet and exercise.  Follow lipid panel.   

## 2014-02-20 NOTE — Assessment & Plan Note (Signed)
Reflux controlled on nexium.  Has tried prilosec previously.  Did not work as well.  Continue on nexium since symptoms controlled.

## 2014-03-13 ENCOUNTER — Other Ambulatory Visit: Payer: Self-pay | Admitting: Internal Medicine

## 2014-03-31 ENCOUNTER — Ambulatory Visit: Payer: Self-pay | Admitting: Neurology

## 2014-04-03 ENCOUNTER — Other Ambulatory Visit: Payer: Self-pay | Admitting: Internal Medicine

## 2014-04-29 ENCOUNTER — Encounter: Payer: BC Managed Care – PPO | Admitting: Internal Medicine

## 2014-05-25 ENCOUNTER — Other Ambulatory Visit: Payer: Self-pay | Admitting: Internal Medicine

## 2014-06-27 ENCOUNTER — Other Ambulatory Visit: Payer: Self-pay | Admitting: Internal Medicine

## 2014-07-28 ENCOUNTER — Ambulatory Visit (INDEPENDENT_AMBULATORY_CARE_PROVIDER_SITE_OTHER): Payer: BC Managed Care – PPO | Admitting: Internal Medicine

## 2014-07-28 ENCOUNTER — Encounter: Payer: Self-pay | Admitting: Internal Medicine

## 2014-07-28 ENCOUNTER — Other Ambulatory Visit: Payer: Self-pay | Admitting: Internal Medicine

## 2014-07-28 VITALS — BP 120/70 | HR 72 | Temp 98.3°F | Ht 67.5 in | Wt 226.0 lb

## 2014-07-28 DIAGNOSIS — R7989 Other specified abnormal findings of blood chemistry: Secondary | ICD-10-CM

## 2014-07-28 DIAGNOSIS — I10 Essential (primary) hypertension: Secondary | ICD-10-CM

## 2014-07-28 DIAGNOSIS — E785 Hyperlipidemia, unspecified: Secondary | ICD-10-CM

## 2014-07-28 DIAGNOSIS — E559 Vitamin D deficiency, unspecified: Secondary | ICD-10-CM

## 2014-07-28 DIAGNOSIS — K219 Gastro-esophageal reflux disease without esophagitis: Secondary | ICD-10-CM

## 2014-07-28 DIAGNOSIS — G35 Multiple sclerosis: Secondary | ICD-10-CM

## 2014-07-28 DIAGNOSIS — D649 Anemia, unspecified: Secondary | ICD-10-CM

## 2014-07-28 DIAGNOSIS — Z1239 Encounter for other screening for malignant neoplasm of breast: Secondary | ICD-10-CM

## 2014-07-28 LAB — CBC WITH DIFFERENTIAL/PLATELET
BASOS PCT: 0.8 % (ref 0.0–3.0)
Basophils Absolute: 0 10*3/uL (ref 0.0–0.1)
EOS PCT: 3.9 % (ref 0.0–5.0)
Eosinophils Absolute: 0.2 10*3/uL (ref 0.0–0.7)
HEMATOCRIT: 39.5 % (ref 36.0–46.0)
Hemoglobin: 12.7 g/dL (ref 12.0–15.0)
LYMPHS ABS: 1.3 10*3/uL (ref 0.7–4.0)
Lymphocytes Relative: 28.8 % (ref 12.0–46.0)
MCHC: 32.2 g/dL (ref 30.0–36.0)
MCV: 85.3 fl (ref 78.0–100.0)
MONOS PCT: 12.9 % — AB (ref 3.0–12.0)
Monocytes Absolute: 0.6 10*3/uL (ref 0.1–1.0)
NEUTROS ABS: 2.4 10*3/uL (ref 1.4–7.7)
Neutrophils Relative %: 53.6 % (ref 43.0–77.0)
Platelets: 283 10*3/uL (ref 150.0–400.0)
RBC: 4.62 Mil/uL (ref 3.87–5.11)
RDW: 14.1 % (ref 11.5–15.5)
WBC: 4.4 10*3/uL (ref 4.0–10.5)

## 2014-07-28 LAB — COMPREHENSIVE METABOLIC PANEL
ALT: 18 U/L (ref 0–35)
AST: 22 U/L (ref 0–37)
Albumin: 3.9 g/dL (ref 3.5–5.2)
Alkaline Phosphatase: 66 U/L (ref 39–117)
BILIRUBIN TOTAL: 0.6 mg/dL (ref 0.2–1.2)
BUN: 20 mg/dL (ref 6–23)
CO2: 31 meq/L (ref 19–32)
CREATININE: 0.8 mg/dL (ref 0.4–1.2)
Calcium: 9.5 mg/dL (ref 8.4–10.5)
Chloride: 103 mEq/L (ref 96–112)
GFR: 91.38 mL/min (ref >60.00)
GLUCOSE: 83 mg/dL (ref 70–99)
Potassium: 3.9 mEq/L (ref 3.5–5.1)
SODIUM: 141 meq/L (ref 135–145)
Total Protein: 6.9 g/dL (ref 6.0–8.3)

## 2014-07-28 LAB — TSH: TSH: 0.34 u[IU]/mL — AB (ref 0.35–4.50)

## 2014-07-28 NOTE — Progress Notes (Signed)
Pre visit review using our clinic review tool, if applicable. No additional management support is needed unless otherwise documented below in the visit note. 

## 2014-07-28 NOTE — Progress Notes (Signed)
Order placed for free T3 and free T4.   

## 2014-07-28 NOTE — Progress Notes (Signed)
Subjective:    Patient ID: Jackie White, female    DOB: 12-May-1956, 58 y.o.   MRN: [id number]  HPI 58 year old female with past history of hypertension, hypercholesterolemia, GERD and multiple sclerosis who comes in today to follow up on these issues as well as for a complete physical exam.   No cardiac symptoms with increased activity or exertion.  Trying to stay active.  Breathing doing well.  Eating and drinking well.  Bowels stable.  Sees Dr Sherryll Burger for her MS.  Flared over the summer.  He changed her medications.  Now on Tecfidera.  No flares recently.  Has leveled out.  Overall she feels she is doing well.  Still has some residual foot drop.  No falls.  Uses a cane.     Past Medical History  Diagnosis Date  . Multiple sclerosis   . Reactive airway disease   . Hypertension   . Cervical disc disease     Cervical and lumbar disc disease  . Vitamin D deficiency   . History of iron deficiency   . GERD (gastroesophageal reflux disease)   . Abnormal Pap smear   . Personal history of colonic polyps     Current Outpatient Prescriptions on File Prior to Visit  Medication Sig Dispense Refill  . metoprolol succinate (TOPROL-XL) 25 MG 24 hr tablet TAKE 1 TABLET BY MOUTH EVERY DAY  90 tablet  0  . NEXIUM 24HR 20 MG capsule TAKE 1 CAPSULE BY MOUTH DAILY BEFORE BREAKFAST  42 capsule  PRN  . valsartan-hydrochlorothiazide (DIOVAN-HCT) 320-25 MG per tablet TAKE 1 TABLET BY MOUTH EVERY DAY  90 tablet  1   No current facility-administered medications on file prior to visit.    Review of Systems Patient denies any headache, lightheadedness or dizziness.  No sinus or allergy symptoms.  No chest pain, tightness or palpitations.  No increased shortness of breath, cough or congestion.  No nausea or vomiting.  No abdominal pain or cramping.  No bowel change, such as diarrhea, constipation, BRBPR or melana.  No urine change.  She reports she had a colonoscopy over the last 1-2 years.  Need results.  Needs  mammogram.  On Tecfidera now.  MS has leveled off.             Objective:   Physical Exam  Filed Vitals:   07/28/14 0934  BP: 120/70  Pulse: 72  Temp: 98.3 F (36.8 C)   Blood pressure recheck:  124/74, pulse 61  58 year old female in no acute distress.   HEENT:  Nares- clear.  Oropharynx - without lesions. NECK:  Supple.  Nontender.  No audible bruit.  HEART:  Appears to be regular. LUNGS:  No crackles or wheezing audible.  Respirations even and unlabored.  RADIAL PULSE:  Equal bilaterally.    BREASTS:  No nipple discharge or nipple retraction present.  Could not appreciate any distinct nodules or axillary adenopathy.  ABDOMEN:  Soft, nontender.  Bowel sounds present and normal.  No audible abdominal bruit.  GU:  Not performed.    EXTREMITIES:  No increased edema present.  DP pulses palpable and equal bilaterally.  FEET:  No lesions.             Assessment & Plan:  HEALTH MAINTENANCE.  Physical today.   Pap 04/04/13 - negative with negative HPV.  Mammogram 02/05/13 - Birads I.  Overdue a mammogram.   Colonoscopy 11/20/02.  Seeing Dr Bluford Kaufmann.  States had colonoscopy within  the last 1-2 years.

## 2014-07-29 ENCOUNTER — Other Ambulatory Visit (INDEPENDENT_AMBULATORY_CARE_PROVIDER_SITE_OTHER): Payer: BC Managed Care – PPO

## 2014-07-29 ENCOUNTER — Other Ambulatory Visit: Payer: Self-pay | Admitting: Internal Medicine

## 2014-07-29 ENCOUNTER — Encounter: Payer: Self-pay | Admitting: Internal Medicine

## 2014-07-29 DIAGNOSIS — R946 Abnormal results of thyroid function studies: Secondary | ICD-10-CM

## 2014-07-29 DIAGNOSIS — R7989 Other specified abnormal findings of blood chemistry: Secondary | ICD-10-CM

## 2014-07-29 LAB — T4, FREE: Free T4: 1.09 ng/dL (ref 0.60–1.60)

## 2014-07-29 LAB — T3, FREE: T3 FREE: 2.8 pg/mL (ref 2.3–4.2)

## 2014-07-29 NOTE — Progress Notes (Signed)
Order placed for f/u labs.  

## 2014-07-30 ENCOUNTER — Encounter: Payer: Self-pay | Admitting: *Deleted

## 2014-08-02 ENCOUNTER — Encounter: Payer: Self-pay | Admitting: Internal Medicine

## 2014-08-02 NOTE — Assessment & Plan Note (Signed)
On vitamin D supplements.  Follow.   

## 2014-08-02 NOTE — Assessment & Plan Note (Signed)
Seeing Dr Sherryll Burger.  See his note for details.  Follow.  Continue to follow up with Dr Sherryll Burger.  She feels things are better now. On Tecfidara.  Residual foot drop.  Uses a cane.

## 2014-08-02 NOTE — Assessment & Plan Note (Signed)
History of iron deficient anemia.  Follow cbc.

## 2014-08-02 NOTE — Assessment & Plan Note (Signed)
Blood pressure has been under good control.  Same medication regimen.  Follow.  Follow metabolic panel.   

## 2014-08-02 NOTE — Assessment & Plan Note (Signed)
Low cholesterol diet and exercise.  Follow lipid panel.   

## 2014-08-02 NOTE — Assessment & Plan Note (Signed)
Reflux controlled on nexium.  Has tried prilosec previously.  Did not work as well.  Continue on nexium since symptoms controlled.   

## 2014-08-02 NOTE — Assessment & Plan Note (Signed)
Physical 04/04/13.  Pap negative with negative HPV.  Sees Dr Livengood.    

## 2014-08-27 ENCOUNTER — Other Ambulatory Visit (INDEPENDENT_AMBULATORY_CARE_PROVIDER_SITE_OTHER): Payer: BC Managed Care – PPO

## 2014-08-27 DIAGNOSIS — R7989 Other specified abnormal findings of blood chemistry: Secondary | ICD-10-CM

## 2014-08-27 DIAGNOSIS — R946 Abnormal results of thyroid function studies: Secondary | ICD-10-CM

## 2014-08-27 LAB — TSH: TSH: 1.29 u[IU]/mL (ref 0.35–4.50)

## 2014-08-27 LAB — T4, FREE: FREE T4: 1.01 ng/dL (ref 0.60–1.60)

## 2014-08-27 LAB — T3, FREE: T3, Free: 2.6 pg/mL (ref 2.3–4.2)

## 2014-08-28 ENCOUNTER — Encounter: Payer: Self-pay | Admitting: Internal Medicine

## 2014-09-07 ENCOUNTER — Ambulatory Visit: Payer: Self-pay | Admitting: Internal Medicine

## 2014-09-07 LAB — HM MAMMOGRAPHY: HM Mammogram: NEGATIVE

## 2014-09-08 ENCOUNTER — Encounter: Payer: Self-pay | Admitting: *Deleted

## 2014-09-10 ENCOUNTER — Other Ambulatory Visit: Payer: Self-pay | Admitting: Internal Medicine

## 2014-09-15 ENCOUNTER — Encounter: Payer: Self-pay | Admitting: Internal Medicine

## 2014-09-26 ENCOUNTER — Other Ambulatory Visit: Payer: Self-pay | Admitting: Internal Medicine

## 2014-12-05 ENCOUNTER — Other Ambulatory Visit: Payer: Self-pay | Admitting: Internal Medicine

## 2014-12-17 ENCOUNTER — Other Ambulatory Visit: Payer: Self-pay | Admitting: Internal Medicine

## 2015-01-26 ENCOUNTER — Ambulatory Visit (INDEPENDENT_AMBULATORY_CARE_PROVIDER_SITE_OTHER): Payer: BC Managed Care – PPO | Admitting: Internal Medicine

## 2015-01-26 ENCOUNTER — Encounter: Payer: Self-pay | Admitting: Internal Medicine

## 2015-01-26 VITALS — BP 121/75 | HR 61 | Temp 98.2°F | Ht 67.5 in | Wt 227.1 lb

## 2015-01-26 DIAGNOSIS — E7800 Pure hypercholesterolemia, unspecified: Secondary | ICD-10-CM

## 2015-01-26 DIAGNOSIS — R21 Rash and other nonspecific skin eruption: Secondary | ICD-10-CM

## 2015-01-26 DIAGNOSIS — G35 Multiple sclerosis: Secondary | ICD-10-CM

## 2015-01-26 DIAGNOSIS — I10 Essential (primary) hypertension: Secondary | ICD-10-CM | POA: Diagnosis not present

## 2015-01-26 DIAGNOSIS — R7989 Other specified abnormal findings of blood chemistry: Secondary | ICD-10-CM

## 2015-01-26 DIAGNOSIS — R946 Abnormal results of thyroid function studies: Secondary | ICD-10-CM

## 2015-01-26 DIAGNOSIS — E559 Vitamin D deficiency, unspecified: Secondary | ICD-10-CM | POA: Diagnosis not present

## 2015-01-26 DIAGNOSIS — Z0000 Encounter for general adult medical examination without abnormal findings: Secondary | ICD-10-CM

## 2015-01-26 DIAGNOSIS — R87619 Unspecified abnormal cytological findings in specimens from cervix uteri: Secondary | ICD-10-CM

## 2015-01-26 DIAGNOSIS — K219 Gastro-esophageal reflux disease without esophagitis: Secondary | ICD-10-CM

## 2015-01-26 DIAGNOSIS — D649 Anemia, unspecified: Secondary | ICD-10-CM

## 2015-01-26 LAB — LIPID PANEL
Cholesterol: 178 mg/dL (ref 0–200)
HDL: 82.1 mg/dL (ref >39.00)
LDL Cholesterol: 82 mg/dL (ref 0–99)
NONHDL: 95.9
Total CHOL/HDL Ratio: 2
Triglycerides: 69 mg/dL (ref 0.0–149.0)
VLDL: 13.8 mg/dL (ref 0.0–40.0)

## 2015-01-26 LAB — BASIC METABOLIC PANEL
BUN: 11 mg/dL (ref 6–23)
CALCIUM: 9.7 mg/dL (ref 8.4–10.5)
CO2: 34 mEq/L — ABNORMAL HIGH (ref 19–32)
CREATININE: 0.75 mg/dL (ref 0.40–1.20)
Chloride: 102 mEq/L (ref 96–112)
GFR: 102.53 mL/min (ref >60.00)
Glucose, Bld: 94 mg/dL (ref 70–99)
Potassium: 4.1 mEq/L (ref 3.5–5.1)
Sodium: 140 mEq/L (ref 135–145)

## 2015-01-26 LAB — HEPATIC FUNCTION PANEL
ALT: 19 U/L (ref 0–35)
AST: 18 U/L (ref 0–37)
Albumin: 4.3 g/dL (ref 3.5–5.2)
Alkaline Phosphatase: 84 U/L (ref 39–117)
BILIRUBIN DIRECT: 0.1 mg/dL (ref 0.0–0.3)
Total Bilirubin: 0.6 mg/dL (ref 0.2–1.2)
Total Protein: 7.1 g/dL (ref 6.0–8.3)

## 2015-01-26 LAB — TSH: TSH: 1.38 u[IU]/mL (ref 0.35–4.50)

## 2015-01-26 LAB — VITAMIN D 25 HYDROXY (VIT D DEFICIENCY, FRACTURES): VITD: 29.64 ng/mL — AB (ref 30.00–100.00)

## 2015-01-26 MED ORDER — NYSTATIN 100000 UNIT/GM EX CREA: 1.0000 "application " | TOPICAL_CREAM | Freq: Two times a day (BID) | CUTANEOUS | Status: DC

## 2015-01-26 NOTE — Progress Notes (Signed)
Pre visit review using our clinic review tool, if applicable. No additional management support is needed unless otherwise documented below in the visit note. 

## 2015-01-26 NOTE — Progress Notes (Signed)
Patient ID: Jackie White, female   DOB: 01-27-1956, 59 y.o.   MRN: [id number]   Subjective:    Patient ID: Jackie White, female    DOB: [birth year], 59 y.o.   MRN: [id number]  HPI  Patient here for a scheduled follow up.  She states she is doing relatively well.  No cardiac symptoms with increased activity or exertions.  Breathing stable.  Eating and drinking well.  Bowels stable.  No recent MS flares.  Does have a rash in her groin.  Present intermittently for the last 3 months.  Has used neosporin, cortisone cream and benzocaine cream.     Past Medical History  Diagnosis Date  . Multiple sclerosis   . Reactive airway disease   . Hypertension   . Cervical disc disease     Cervical and lumbar disc disease  . Vitamin D deficiency   . History of iron deficiency   . GERD (gastroesophageal reflux disease)   . Abnormal Pap smear   . Personal history of colonic polyps     Current Outpatient Prescriptions on File Prior to Visit  Medication Sig Dispense Refill  . Dimethyl Fumarate (TECFIDERA) 240 MG CPDR Take by mouth 2 (two) times daily.    . metoprolol succinate (TOPROL-XL) 25 MG 24 hr tablet TAKE 1 TABLET BY MOUTH EVERY DAY 90 tablet 0  . NEXIUM 24HR 20 MG capsule TAKE 1 CAPSULE BY MOUTH DAILY BEFORE BREAKFAST 42 capsule PRN  . valsartan-hydrochlorothiazide (DIOVAN-HCT) 320-25 MG per tablet TAKE 1 TABLET BY MOUTH EVERY DAY 90 tablet 0   No current facility-administered medications on file prior to visit.    Review of Systems  Constitutional: Negative for appetite change and unexpected weight change.  HENT: Negative for congestion and sinus pressure.   Respiratory: Negative for cough, chest tightness and shortness of breath.   Cardiovascular: Negative for chest pain, palpitations and leg swelling.  Gastrointestinal: Negative for nausea, vomiting, abdominal pain and diarrhea.  Genitourinary: Negative for dysuria and difficulty urinating.  Musculoskeletal: Negative for joint  swelling.  Skin: Positive for rash (rash in groin.  has tried cortisone cream, benzocaine crema and neosporin. ). Negative for color change.  Neurological: Negative for dizziness, light-headedness and headaches.  Psychiatric/Behavioral: Negative for dysphoric mood and agitation.       Objective:    Physical Exam  Constitutional: She appears well-developed and well-nourished. No distress.  HENT:  Nose: Nose normal.  Mouth/Throat: Oropharynx is clear and moist.  Neck: Neck supple. No thyromegaly present.  Cardiovascular: Normal rate and regular rhythm.   Pulmonary/Chest: Breath sounds normal. No respiratory distress. She has no wheezes.  Abdominal: Soft. Bowel sounds are normal. There is no tenderness.  Musculoskeletal: She exhibits no edema or tenderness.  Lymphadenopathy:    She has no cervical adenopathy.  Neurological: She is alert.  Skin: Rash noted. There is erythema.  Rash in groin as outlined.    Psychiatric: She has a normal mood and affect. Her behavior is normal.    BP 121/75 mmHg  Pulse 61  Temp(Src) 98.2 F (36.8 C) (Oral)  Ht 5' 7.5" (1.715 m)  Wt 227 lb 2 oz (103.023 kg)  BMI 35.03 kg/m2  SpO2 99%  LMP 12/27/2000 Wt Readings from Last 3 Encounters:  01/26/15 227 lb 2 oz (103.023 kg)  07/28/14 226 lb (102.513 kg)  02/16/14 215 lb (97.523 kg)     Lab Results  Component Value Date   WBC 4.4 07/28/2014   HGB 12.7 07/28/2014  HCT 39.5 07/28/2014   PLT 283.0 07/28/2014   GLUCOSE 94 01/26/2015   CHOL 178 01/26/2015   TRIG 69.0 01/26/2015   HDL 82.10 01/26/2015   LDLCALC 82 01/26/2015   ALT 19 01/26/2015   AST 18 01/26/2015   NA 140 01/26/2015   K 4.1 01/26/2015   CL 102 01/26/2015   CREATININE 0.75 01/26/2015   BUN 11 01/26/2015   CO2 34* 01/26/2015   TSH 1.38 01/26/2015       Assessment & Plan:   Problem List Items Addressed This Visit    Abnormal Pap smear of cervix    Sees Dr Dorma Russell.  PAP 04/04/13 - negative with negative HPV.          Anemia    H/o iron deficiency.  Follow cbc.       Essential hypertension, benign - Primary    Blood pressure is doing well.  Same medication regimen.  Follow metabolic panel.        Relevant Orders   Lipid panel (Completed)   Basic metabolic panel (Completed)   GERD (gastroesophageal reflux disease)    No reported problems today.        Health care maintenance    Physical 07/28/14.  Pap 04/04/13 - negative with negative HPV.  Colonoscopy (per pt) within the last 1-2 years.  Obtain results.  Mammogram 09/07/14 - birads I.        Hypercholesterolemia    Low cholesterol diet and exercise.  Follow lipid panel.       Multiple sclerosis    Seeing Dr Sherryll Burger.  On Tecfidara.  Stable.  Follow.        Relevant Orders   Hepatic function panel (Completed)   Rash    Groin rash.  Has used cortisone cream and benzocaine.  Try nystatin cream.  Keep area dry.  Follow.        Vitamin D deficiency    Vitamin D supplements.  Recheck vitamin D level.       Relevant Orders   Vit D  25 hydroxy (rtn osteoporosis monitoring) (Completed)    Other Visit Diagnoses    Low TSH level        Relevant Orders    TSH (Completed)      I spent 25 minutes with the patient and more than 50% of the time was spent in consultation regarding the above.     Dale Emelle, MD

## 2015-01-27 ENCOUNTER — Encounter: Payer: Self-pay | Admitting: Internal Medicine

## 2015-01-31 ENCOUNTER — Encounter: Payer: Self-pay | Admitting: Internal Medicine

## 2015-01-31 DIAGNOSIS — R21 Rash and other nonspecific skin eruption: Secondary | ICD-10-CM | POA: Insufficient documentation

## 2015-01-31 DIAGNOSIS — Z0000 Encounter for general adult medical examination without abnormal findings: Secondary | ICD-10-CM | POA: Insufficient documentation

## 2015-01-31 NOTE — Assessment & Plan Note (Signed)
Vitamin D supplements.  Recheck vitamin D level.   

## 2015-01-31 NOTE — Assessment & Plan Note (Signed)
Blood pressure is doing well.  Same medication regimen.  Follow metabolic panel.  

## 2015-01-31 NOTE — Assessment & Plan Note (Signed)
H/o iron deficiency.  Follow cbc.

## 2015-01-31 NOTE — Assessment & Plan Note (Signed)
Physical 07/28/14.  Pap 04/04/13 - negative with negative HPV.  Colonoscopy (per pt) within the last 1-2 years.  Obtain results.  Mammogram 09/07/14 - birads I.

## 2015-01-31 NOTE — Assessment & Plan Note (Signed)
Groin rash.  Has used cortisone cream and benzocaine.  Try nystatin cream.  Keep area dry.  Follow.

## 2015-01-31 NOTE — Assessment & Plan Note (Signed)
Seeing Dr Sherryll Burger.  On Tecfidara.  Stable.  Follow.

## 2015-01-31 NOTE — Assessment & Plan Note (Signed)
No reported problems today.

## 2015-01-31 NOTE — Assessment & Plan Note (Signed)
Sees Dr Dorma Russell.  PAP 04/04/13 - negative with negative HPV.

## 2015-01-31 NOTE — Assessment & Plan Note (Signed)
Low cholesterol diet and exercise.  Follow lipid panel.   

## 2015-02-19 ENCOUNTER — Encounter: Payer: Self-pay | Admitting: Internal Medicine

## 2015-02-19 MED ORDER — NYSTATIN 100000 UNIT/GM EX POWD: Freq: Two times a day (BID) | CUTANEOUS | Status: DC

## 2015-02-19 MED ORDER — NYSTATIN 100000 UNIT/GM EX CREA: 1.0000 "application " | TOPICAL_CREAM | Freq: Two times a day (BID) | CUTANEOUS | Status: DC

## 2015-02-19 NOTE — Telephone Encounter (Signed)
Sent in rx for nystatin cream and powder.

## 2015-03-10 ENCOUNTER — Other Ambulatory Visit: Payer: Self-pay | Admitting: Internal Medicine

## 2015-03-14 ENCOUNTER — Other Ambulatory Visit: Payer: Self-pay | Admitting: Internal Medicine

## 2015-04-20 ENCOUNTER — Encounter: Payer: Self-pay | Admitting: Internal Medicine

## 2015-04-20 DIAGNOSIS — M779 Enthesopathy, unspecified: Secondary | ICD-10-CM

## 2015-04-20 DIAGNOSIS — M7730 Calcaneal spur, unspecified foot: Secondary | ICD-10-CM

## 2015-04-20 MED ORDER — NYSTATIN 100000 UNIT/GM EX CREA: 1.0000 "application " | TOPICAL_CREAM | Freq: Two times a day (BID) | CUTANEOUS | Status: DC

## 2015-04-20 NOTE — Telephone Encounter (Signed)
Refilled nystatin cream x 1.  Also placed order for referral to podiatry per pt request - see phone message.

## 2015-04-21 ENCOUNTER — Encounter: Payer: Self-pay | Admitting: Internal Medicine

## 2015-05-17 ENCOUNTER — Encounter: Payer: Self-pay | Admitting: Internal Medicine

## 2015-05-17 DIAGNOSIS — R21 Rash and other nonspecific skin eruption: Secondary | ICD-10-CM

## 2015-05-17 NOTE — Telephone Encounter (Signed)
Responded to pt in mychart

## 2015-05-18 NOTE — Telephone Encounter (Signed)
Order placed for gyn referral.  Pt notified via my chart.   

## 2015-05-18 NOTE — Addendum Note (Signed)
Addended by: Charm Barges on: 05/18/2015 08:26 PM   Modules accepted: Orders

## 2015-05-29 ENCOUNTER — Other Ambulatory Visit: Payer: Self-pay | Admitting: Internal Medicine

## 2015-05-31 ENCOUNTER — Other Ambulatory Visit: Payer: Self-pay | Admitting: Internal Medicine

## 2015-06-16 ENCOUNTER — Encounter: Payer: Self-pay | Admitting: Obstetrics and Gynecology

## 2015-07-17 ENCOUNTER — Other Ambulatory Visit: Payer: Self-pay | Admitting: Internal Medicine

## 2015-07-30 ENCOUNTER — Encounter: Payer: Self-pay | Admitting: Internal Medicine

## 2015-07-30 ENCOUNTER — Ambulatory Visit (INDEPENDENT_AMBULATORY_CARE_PROVIDER_SITE_OTHER): Payer: BC Managed Care – PPO | Admitting: Internal Medicine

## 2015-07-30 ENCOUNTER — Ambulatory Visit (INDEPENDENT_AMBULATORY_CARE_PROVIDER_SITE_OTHER)
Admission: RE | Admit: 2015-07-30 | Discharge: 2015-07-30 | Disposition: A | Discharge status: Home / Self Care | Payer: BC Managed Care – PPO | Source: Ambulatory Visit | Attending: Internal Medicine | Admitting: Internal Medicine

## 2015-07-30 VITALS — BP 120/78 | HR 53 | Temp 98.2°F | Resp 16 | Ht 68.0 in | Wt 228.4 lb

## 2015-07-30 DIAGNOSIS — Z1239 Encounter for other screening for malignant neoplasm of breast: Secondary | ICD-10-CM | POA: Diagnosis not present

## 2015-07-30 DIAGNOSIS — M25562 Pain in left knee: Secondary | ICD-10-CM

## 2015-07-30 DIAGNOSIS — G35 Multiple sclerosis: Secondary | ICD-10-CM

## 2015-07-30 DIAGNOSIS — E7800 Pure hypercholesterolemia, unspecified: Secondary | ICD-10-CM

## 2015-07-30 DIAGNOSIS — K219 Gastro-esophageal reflux disease without esophagitis: Secondary | ICD-10-CM

## 2015-07-30 DIAGNOSIS — I10 Essential (primary) hypertension: Secondary | ICD-10-CM | POA: Diagnosis not present

## 2015-07-30 DIAGNOSIS — D649 Anemia, unspecified: Secondary | ICD-10-CM

## 2015-07-30 DIAGNOSIS — R87619 Unspecified abnormal cytological findings in specimens from cervix uteri: Secondary | ICD-10-CM

## 2015-07-30 LAB — COMPREHENSIVE METABOLIC PANEL
ALT: 17 U/L (ref 0–35)
AST: 17 U/L (ref 0–37)
Albumin: 4.1 g/dL (ref 3.5–5.2)
Alkaline Phosphatase: 80 U/L (ref 39–117)
BILIRUBIN TOTAL: 0.4 mg/dL (ref 0.2–1.2)
BUN: 14 mg/dL (ref 6–23)
CO2: 32 meq/L (ref 19–32)
Calcium: 9.7 mg/dL (ref 8.4–10.5)
Chloride: 104 mEq/L (ref 96–112)
Creatinine, Ser: 0.73 mg/dL (ref 0.40–1.20)
GFR: 105.59 mL/min (ref >60.00)
GLUCOSE: 84 mg/dL (ref 70–99)
Potassium: 4.1 mEq/L (ref 3.5–5.1)
Sodium: 141 mEq/L (ref 135–145)
Total Protein: 6.8 g/dL (ref 6.0–8.3)

## 2015-07-30 LAB — CBC WITH DIFFERENTIAL/PLATELET
BASOS ABS: 0 10*3/uL (ref 0.0–0.1)
Basophils Relative: 0.8 % (ref 0.0–3.0)
Eosinophils Absolute: 0.1 10*3/uL (ref 0.0–0.7)
Eosinophils Relative: 2.5 % (ref 0.0–5.0)
HEMATOCRIT: 40.8 % (ref 36.0–46.0)
Hemoglobin: 13.1 g/dL (ref 12.0–15.0)
LYMPHS PCT: 25.3 % (ref 12.0–46.0)
Lymphs Abs: 1 10*3/uL (ref 0.7–4.0)
MCHC: 32.3 g/dL (ref 30.0–36.0)
MCV: 84.5 fl (ref 78.0–100.0)
Monocytes Absolute: 0.5 10*3/uL (ref 0.1–1.0)
Monocytes Relative: 11.6 % (ref 3.0–12.0)
NEUTROS ABS: 2.4 10*3/uL (ref 1.4–7.7)
Neutrophils Relative %: 59.8 % (ref 43.0–77.0)
PLATELETS: 278 10*3/uL (ref 150.0–400.0)
RBC: 4.83 Mil/uL (ref 3.87–5.11)
RDW: 15.6 % — ABNORMAL HIGH (ref 11.5–15.5)
WBC: 4 10*3/uL (ref 4.0–10.5)

## 2015-07-30 NOTE — Progress Notes (Signed)
Pre visit review using our clinic review tool, if applicable. No additional management support is needed unless otherwise documented below in the visit note. 

## 2015-07-30 NOTE — Progress Notes (Signed)
Patient ID: Jackie White, female   DOB: June 11, 1956, 59 y.o.   MRN: [id number]   Subjective:    Patient ID: Jackie White, female    DOB: 1956-02-03, 59 y.o.   MRN: [id number]  HPI  Patient here for a scheduled follow up.  Reports increased left knee pain.  Notices when walking.  Limits her activity.  Increased - recently.  Has previously had arthroscopy - Dr Roney Mans.  Is eating and drinking well.  No nausea or vomiting.  No acid reflux.  No recent flares with her MS.  Sees Dr Sherryll Burger.  Request to have f/u cbc today.  Bowels stable.    Past Medical History  Diagnosis Date  . Multiple sclerosis   . Reactive airway disease   . Hypertension   . Cervical disc disease     Cervical and lumbar disc disease  . Vitamin D deficiency   . History of iron deficiency   . GERD (gastroesophageal reflux disease)   . Abnormal Pap smear   . Personal history of colonic polyps    Past Surgical History  Procedure Laterality Date  . Total abdominal hysterectomy  2003    Right Oophrectomy  . Breast reduction surgery     Family History  Problem Relation Age of Onset  . Stroke Mother   . Hypertension Mother   . Heart disease Maternal Grandmother    Social History   Social History  . Marital Status: Single    Spouse Name: N/A  . Number of Children: N/A  . Years of Education: N/A   Social History Main Topics  . Smoking status: Never Smoker   . Smokeless tobacco: Never Used  . Alcohol Use: 0.0 oz/week    0 Standard drinks or equivalent per week  . Drug Use: No  . Sexual Activity: Not Asked   Other Topics Concern  . None   Social History Narrative    Outpatient Encounter Prescriptions as of 07/30/2015  Medication Sig  . Dimethyl Fumarate (TECFIDERA) 240 MG CPDR Take by mouth 2 (two) times daily.  Marland Kitchen esomeprazole (NEXIUM) 20 MG capsule TAKE 1 CAPSULE BY MOUTH DAILY BEFORE BREAKFAST  . metoprolol succinate (TOPROL-XL) 25 MG 24 hr tablet TAKE 1 TABLET BY MOUTH EVERY DAY  . NEXIUM 24HR 20  MG capsule TAKE 1 CAPSULE BY MOUTH DAILY BEFORE BREAKFAST  . nystatin cream (MYCOSTATIN) Apply 1 application topically 2 (two) times daily.  . valsartan-hydrochlorothiazide (DIOVAN-HCT) 320-25 MG per tablet TAKE 1 TABLET BY MOUTH EVERY DAY   No facility-administered encounter medications on file as of 07/30/2015.    Review of Systems  Constitutional: Negative for appetite change and unexpected weight change.  HENT: Negative for congestion and sinus pressure.   Eyes: Negative for discharge and visual disturbance.  Respiratory: Negative for cough, chest tightness and shortness of breath.   Cardiovascular: Negative for chest pain, palpitations and leg swelling.  Gastrointestinal: Negative for nausea, vomiting, abdominal pain and diarrhea.  Genitourinary: Negative for dysuria and difficulty urinating.  Musculoskeletal:       Left knee pain as outlined.  Limits her activity.    Skin: Negative for color change and rash.  Neurological: Negative for dizziness, light-headedness and headaches.       No recent MS flares.   Hematological: Negative for adenopathy. Does not bruise/bleed easily.  Psychiatric/Behavioral: Negative for dysphoric mood and agitation.       Objective:     Blood pressure rechecked by me:  130/78-80  Physical Exam  Constitutional: She appears well-developed and well-nourished. No distress.  HENT:  Nose: Nose normal.  Mouth/Throat: Oropharynx is clear and moist.  Eyes: Conjunctivae are normal. Right eye exhibits no discharge. Left eye exhibits no discharge.  Neck: Neck supple. No thyromegaly present.  Cardiovascular: Normal rate and regular rhythm.   Pulmonary/Chest: Breath sounds normal. No respiratory distress. She has no wheezes.  Abdominal: Soft. Bowel sounds are normal. There is no tenderness.  Musculoskeletal: She exhibits no edema or tenderness.  Increased pain - left knee with resistance against flexion and extension of left knee.   Lymphadenopathy:    She  has no cervical adenopathy.  Skin: No rash noted. No erythema.  Psychiatric: She has a normal mood and affect. Her behavior is normal.    BP 120/78 mmHg  Pulse 53  Temp(Src) 98.2 F (36.8 C)  Resp 16  Ht  (1.727 m)  Wt 228 lb 6.4 oz (103.602 kg)  BMI 34.74 kg/m2  SpO2 99%  LMP 12/27/2000 Wt Readings from Last 3 Encounters:  07/30/15 228 lb 6.4 oz (103.602 kg)  01/26/15 227 lb 2 oz (103.023 kg)  07/28/14 226 lb (102.513 kg)     Lab Results  Component Value Date   WBC 4.0 07/30/2015   HGB 13.1 07/30/2015   HCT 40.8 07/30/2015   PLT 278.0 07/30/2015   GLUCOSE 84 07/30/2015   CHOL 178 01/26/2015   TRIG 69.0 01/26/2015   HDL 82.10 01/26/2015   LDLCALC 82 01/26/2015   ALT 17 07/30/2015   AST 17 07/30/2015   NA 141 07/30/2015   K 4.1 07/30/2015   CL 104 07/30/2015   CREATININE 0.73 07/30/2015   BUN 14 07/30/2015   CO2 32 07/30/2015   TSH 1.38 01/26/2015       Assessment & Plan:   Problem List Items Addressed This Visit    Abnormal Pap smear of cervix    Had ben followed by Dr Dorma Russell.  PAP 04/04/13 negative with negative HPV.  Was evaluated at [facility] recently.  Had breast, pelvic and pap smer.  PSP 06/15/15 - negative with negative HPV.        Anemia    History of iron deficiency.  Follow cbc.       Essential hypertension, benign - Primary    Blood pressure under good control.  Continue same medication regimen.  Follow pressures.  Follow metabolic panel.        Relevant Orders   Comprehensive metabolic panel (Completed)   GERD (gastroesophageal reflux disease)    On nexium.  No acid reflux reported.       Hypercholesterolemia    Low cholesterol diet and exercise.  Follow lipid panel.        Left knee pain    Taking antiinflammatories.  Does help some.  Persistent.  Worsening.  Check xray.  May need referral to ortho.       Relevant Orders   DG Knee 1-2 Views Left (Completed)   Multiple sclerosis    Followed by Dr Sherryll Burger.  On Tecfidara.  No  recent flares.  Request cbc to be checked today.       Relevant Orders   CBC with Differential/Platelet (Completed)    Other Visit Diagnoses    Breast cancer screening        Relevant Orders    MM DIGITAL SCREENING BILATERAL        Dale Haven, MD

## 2015-07-31 ENCOUNTER — Encounter: Payer: Self-pay | Admitting: Internal Medicine

## 2015-07-31 NOTE — Assessment & Plan Note (Signed)
Taking antiinflammatories.  Does help some.  Persistent.  Worsening.  Check xray.  May need referral to ortho.

## 2015-07-31 NOTE — Assessment & Plan Note (Signed)
On nexium.  No acid reflux reported.

## 2015-07-31 NOTE — Assessment & Plan Note (Signed)
Followed by Dr Sherryll Burger.  On Tecfidara.  No recent flares.  Request cbc to be checked today.

## 2015-07-31 NOTE — Assessment & Plan Note (Addendum)
Had ben followed by Dr Dorma Russell.  PAP 04/04/13 negative with negative HPV.  Was evaluated at [facility] recently.  Had breast, pelvic and pap smer.  PSP 06/15/15 - negative with negative HPV.

## 2015-07-31 NOTE — Assessment & Plan Note (Signed)
History of iron deficiency.  Follow cbc.

## 2015-07-31 NOTE — Assessment & Plan Note (Signed)
Low cholesterol diet and exercise.  Follow lipid panel.   

## 2015-07-31 NOTE — Assessment & Plan Note (Signed)
Blood pressure under good control.  Continue same medication regimen.  Follow pressures.  Follow metabolic panel.   

## 2015-08-03 ENCOUNTER — Encounter: Payer: Self-pay | Admitting: Obstetrics and Gynecology

## 2015-08-03 NOTE — Telephone Encounter (Signed)
Unread mychart message mailed to patient 

## 2015-08-27 ENCOUNTER — Other Ambulatory Visit: Payer: Self-pay | Admitting: Internal Medicine

## 2015-09-01 ENCOUNTER — Other Ambulatory Visit: Payer: Self-pay | Admitting: Internal Medicine

## 2015-09-07 ENCOUNTER — Other Ambulatory Visit: Payer: Self-pay | Admitting: Internal Medicine

## 2015-09-09 ENCOUNTER — Encounter: Payer: Self-pay | Admitting: Obstetrics and Gynecology

## 2015-09-17 ENCOUNTER — Ambulatory Visit
Admission: RE | Admit: 2015-09-17 | Discharge: 2015-09-17 | Disposition: A | Discharge status: Home / Self Care | Payer: BC Managed Care – PPO | Source: Ambulatory Visit | Attending: Internal Medicine | Admitting: Internal Medicine

## 2015-09-17 DIAGNOSIS — Z1231 Encounter for screening mammogram for malignant neoplasm of breast: Secondary | ICD-10-CM | POA: Diagnosis present

## 2015-09-17 DIAGNOSIS — Z1239 Encounter for other screening for malignant neoplasm of breast: Secondary | ICD-10-CM

## 2015-11-18 ENCOUNTER — Other Ambulatory Visit: Payer: Self-pay | Admitting: Internal Medicine

## 2015-12-02 ENCOUNTER — Other Ambulatory Visit: Payer: Self-pay | Admitting: Internal Medicine

## 2015-12-16 ENCOUNTER — Encounter: Payer: Self-pay | Admitting: Internal Medicine

## 2015-12-29 ENCOUNTER — Other Ambulatory Visit: Payer: Self-pay | Admitting: Neurology

## 2015-12-29 DIAGNOSIS — G35 Multiple sclerosis: Secondary | ICD-10-CM

## 2016-01-17 ENCOUNTER — Ambulatory Visit
Admission: RE | Admit: 2016-01-17 | Discharge: 2016-01-17 | Disposition: A | Discharge status: Home / Self Care | Payer: BC Managed Care – PPO | Source: Ambulatory Visit | Attending: Neurology | Admitting: Neurology

## 2016-01-17 DIAGNOSIS — G35 Multiple sclerosis: Secondary | ICD-10-CM

## 2016-01-17 MED ORDER — GADOBENATE DIMEGLUMINE 529 MG/ML IV SOLN
20.0000 mL | Freq: Once | INTRAVENOUS | Status: AC | PRN
  Administered 2016-01-17: 20 mL via INTRAVENOUS

## 2016-01-28 ENCOUNTER — Ambulatory Visit (INDEPENDENT_AMBULATORY_CARE_PROVIDER_SITE_OTHER): Payer: BC Managed Care – PPO | Admitting: Internal Medicine

## 2016-01-28 ENCOUNTER — Encounter: Payer: Self-pay | Admitting: Internal Medicine

## 2016-01-28 VITALS — BP 110/70 | HR 65 | Temp 98.3°F | Resp 18 | Ht 68.0 in | Wt 229.1 lb

## 2016-01-28 DIAGNOSIS — I10 Essential (primary) hypertension: Secondary | ICD-10-CM | POA: Diagnosis not present

## 2016-01-28 DIAGNOSIS — E7800 Pure hypercholesterolemia, unspecified: Secondary | ICD-10-CM

## 2016-01-28 DIAGNOSIS — E041 Nontoxic single thyroid nodule: Secondary | ICD-10-CM

## 2016-01-28 DIAGNOSIS — G35 Multiple sclerosis: Secondary | ICD-10-CM

## 2016-01-28 DIAGNOSIS — K219 Gastro-esophageal reflux disease without esophagitis: Secondary | ICD-10-CM | POA: Diagnosis not present

## 2016-01-28 DIAGNOSIS — E559 Vitamin D deficiency, unspecified: Secondary | ICD-10-CM

## 2016-01-28 DIAGNOSIS — R87619 Unspecified abnormal cytological findings in specimens from cervix uteri: Secondary | ICD-10-CM

## 2016-01-28 LAB — TSH: TSH: 1 u[IU]/mL (ref 0.35–4.50)

## 2016-01-28 LAB — T4, FREE: Free T4: 1.08 ng/dL (ref 0.60–1.60)

## 2016-01-28 LAB — T3, FREE: T3 FREE: 3 pg/mL (ref 2.3–4.2)

## 2016-01-28 MED ORDER — METOPROLOL SUCCINATE ER 25 MG PO TB24: 25.0000 mg | ORAL_TABLET | Freq: Every day | ORAL | Status: DC

## 2016-01-28 NOTE — Progress Notes (Signed)
Patient ID: Jackie White, female   DOB: 1956/03/18, 60 y.o.   MRN: [id number]   Subjective:    Patient ID: Jackie White, female    DOB: [birth year], 61 y.o.   MRN: [id number]  HPI  Patient with past history of MS, GERD and hypertension.  She comes in today for a scheduled follow up.  She is followed by Dr Sherryll Burger.  Had shingles.  Was off tecfidera for awhile.  Back on now.  Just had MRI recently.  See report.  No active lesions.  Feels things are stable with her MS.  No recent flares.  Her mother passed away October 04, 2015.  Increased stress related to this.  Overall she feels she is handling things relatively well.  Sees gyn for her breast, pelvic and pap smears.  States up to date.  Last pap 05/2015- negative with negative HPV.  She had questions about her thyroid.  On MRI - mention of thyroid nodules.  Wants thyroid function checked.     Past Medical History  Diagnosis Date  . Multiple sclerosis (HCC)   . Reactive airway disease   . Hypertension   . Cervical disc disease     Cervical and lumbar disc disease  . Vitamin D deficiency   . History of iron deficiency   . GERD (gastroesophageal reflux disease)   . Abnormal Pap smear   . Personal history of colonic polyps    Past Surgical History  Procedure Laterality Date  . Total abdominal hysterectomy  2003    Right Oophrectomy  . Breast reduction surgery    . Reduction mammaplasty Bilateral 2002   Family History  Problem Relation Age of Onset  . Stroke Mother   . Hypertension Mother   . Heart disease Maternal Grandmother    Social History   Social History  . Marital Status: Single    Spouse Name: N/A  . Number of Children: N/A  . Years of Education: N/A   Social History Main Topics  . Smoking status: Never Smoker   . Smokeless tobacco: Never Used  . Alcohol Use: 0.0 oz/week    0 Standard drinks or equivalent per week  . Drug Use: No  . Sexual Activity: Not Asked   Other Topics Concern  . None   Social History Narrative     Outpatient Encounter Prescriptions as of 01/28/2016  Medication Sig  . Dimethyl Fumarate (TECFIDERA) 240 MG CPDR Take by mouth 2 (two) times daily.  Marland Kitchen esomeprazole (NEXIUM) 20 MG capsule TAKE 1 CAPSULE BY MOUTH DAILY BEFORE BREAKFAST  . metoprolol succinate (TOPROL-XL) 25 MG 24 hr tablet Take 1 tablet (25 mg total) by mouth daily.  Marland Kitchen NEXIUM 24HR 20 MG capsule TAKE 1 CAPSULE BY MOUTH DAILY BEFORE BREAKFAST  . valsartan-hydrochlorothiazide (DIOVAN-HCT) 320-25 MG tablet TAKE 1 TABLET BY MOUTH EVERY DAY  . [DISCONTINUED] metoprolol succinate (TOPROL-XL) 25 MG 24 hr tablet TAKE 1 TABLET BY MOUTH EVERY DAY  . [DISCONTINUED] nystatin cream (MYCOSTATIN) Apply 1 application topically 2 (two) times daily.   No facility-administered encounter medications on file as of 01/28/2016.    Review of Systems  Constitutional: Negative for appetite change and unexpected weight change.  HENT: Negative for congestion and sinus pressure.   Eyes: Negative for discharge and redness.  Respiratory: Negative for cough, chest tightness and shortness of breath.   Cardiovascular: Negative for chest pain, palpitations and leg swelling.  Gastrointestinal: Negative for nausea, vomiting, abdominal pain and diarrhea.  Genitourinary: Negative for dysuria and difficulty  urinating.  Musculoskeletal: Negative for back pain and joint swelling.  Skin: Negative for color change and rash.  Neurological: Negative for dizziness, light-headedness and headaches.  Psychiatric/Behavioral: Negative for dysphoric mood and agitation.       Objective:    Physical Exam  Constitutional: She appears well-developed and well-nourished. No distress.  HENT:  Nose: Nose normal.  Mouth/Throat: Oropharynx is clear and moist.  Eyes: Conjunctivae are normal. Right eye exhibits no discharge. Left eye exhibits no discharge.  Neck: Neck supple. No thyromegaly present.  Cardiovascular: Normal rate and regular rhythm.   Pulmonary/Chest: Breath  sounds normal. No respiratory distress. She has no wheezes.  Abdominal: Soft. Bowel sounds are normal. There is no tenderness.  Musculoskeletal: She exhibits no edema or tenderness.  Lymphadenopathy:    She has no cervical adenopathy.  Skin: No rash noted. No erythema.  Psychiatric: She has a normal mood and affect. Her behavior is normal.    BP 110/70 mmHg  Pulse 65  Temp(Src) 98.3 F (36.8 C) (Oral)  Resp 18  Ht  (1.727 m)  Wt 229 lb 2 oz (103.93 kg)  BMI 34.85 kg/m2  SpO2 97%  LMP 12/27/2000 Wt Readings from Last 3 Encounters:  01/28/16 229 lb 2 oz (103.93 kg)  07/30/15 228 lb 6.4 oz (103.602 kg)  01/26/15 227 lb 2 oz (103.023 kg)     Lab Results  Component Value Date   WBC 4.0 07/30/2015   HGB 13.1 07/30/2015   HCT 40.8 07/30/2015   PLT 278.0 07/30/2015   GLUCOSE 84 07/30/2015   CHOL 178 01/26/2015   TRIG 69.0 01/26/2015   HDL 82.10 01/26/2015   LDLCALC 82 01/26/2015   ALT 17 07/30/2015   AST 17 07/30/2015   NA 141 07/30/2015   K 4.1 07/30/2015   CL 104 07/30/2015   CREATININE 0.73 07/30/2015   BUN 14 07/30/2015   CO2 32 07/30/2015   TSH 1.00 01/28/2016    Mr Brain W Wo Contrast  01/17/2016  CLINICAL DATA:  Multiple sclerosis.  No new symptoms.  Follow-up. EXAM: MRI HEAD WITHOUT AND WITH CONTRAST MRI CERVICAL SPINE WITHOUT AND WITH CONTRAST TECHNIQUE: Multiplanar, multiecho pulse sequences of the brain and surrounding structures, and cervical spine, to include the craniocervical junction and cervicothoracic junction, were obtained without and with intravenous contrast. CONTRAST:  20mL MULTIHANCE GADOBENATE DIMEGLUMINE 529 MG/ML IV SOLN COMPARISON:  03/31/2014 FINDINGS: MRI HEAD FINDINGS Calvarium and upper cervical spine: No focal marrow signal abnormality. Orbits: Negative. Sinuses and Mastoids: Clear. Brain: There is extensive patchy FLAIR hyperintensity in the bilateral cerebral white matter consistent with history of multiple sclerosis. Typical multiple  sclerosis pattern with periventricular, juxta cortical, and infratentorial signal involvement. In the cerebral hemispheres there is no evidence of disease progression. Best seen on T2 weighted imaging, there is new signal abnormality in the right posterior lower and left ventral upper medulla compatible with disease progression. No abnormal intracranial enhancement. No acute or interval infarct, hemorrhage, hydrocephalus, or mass lesion. No major vessel occlusion. Stable cerebral volume.  Normal corpus callosum bulk. MRI CERVICAL SPINE FINDINGS Multiple short-segment partial cord signal abnormalities in the cervical spine compatible with multiple sclerosis. Stable lesions seen at: Left posterior column at C2-3. Left hemicord at the upper C4. Right posterior lateral cord at C5-6. Left posterior column at C6-7. Right hemi cord at T1. There is a 3 mm focus of enhancement on both axial and sagittal acquisition in the posterior midline cord at C5-6, compatible with an active plaque.  Enhancement peripherally at the level of T1 and T2 cord could be intradural vessels -these are only seen on sagittal acquisition. The nerve roots have a prominent enhancement but stable appearance compared to prior. Small bilateral thyroid nodules. Stable disc narrowing and bulging from C3-4 to C6-7. Disc bulging amd uncovertebral spurs at C3-4 causes chronic bilateral foraminal stenosis. No canal compromise. IMPRESSION: 1. Multiple sclerosis with intracranial and cord demyelination. Enhancement/active demyelination seen in the posterior cord at C5-6. 2. Since 2015 there is progression in the medulla. Stable cerebral involvement and white matter volume. No signs of active intracranial demyelination. Electronically Signed   By: Jonathon  Watts M.D.   On: 01/17/2016 14:56   Mr Cervical Spine W Wo Contrast  01/17/2016  CLINICAL DATA:  Multiple sclerosis.  No new symptoms.  Follow-up. EXAM: MRI HEAD WITHOUT AND WITH CONTRAST MRI CERVICAL SPINE  WITHOUT AND WITH CONTRAST TECHNIQUE: Multiplanar, multiecho pulse sequences of the brain and surrounding structures, and cervical spine, to include the craniocervical junction and cervicothoracic junction, were obtained without and with intravenous contrast. CONTRAST:  16m15 NStaMarland Kitchenr973CanyMissouri DeltRoanoke Surgery Center 71L31m 798 SLaMarland Kitchenw817[address] VinceAscension Chi St. Vincent Infirmary Health Syst49e5HenMarland Kitchenr(810)[place] La Seqouia SuSuncoast Behavioral Health Cent2OMarland Kitchens640[facility] SNorth [facility] SuSouthwestern Medical Cent35e4White Marland KitchenS256[place] SuVantage SIngalls Same Day Surgery Center Ltd P3TrumaMarland Kitchenn321Kaiser Fnd Hosp - RehabilitatiMSan Bernardino Eye Surgery Center [id number] Kitchen 910Land[facility] HosVa [facility] - Atlan6t40my92Marland Kitchen38[facility] HosMethodist Health Care - OliveHealthmark Regional Medical Cent28e27mHuntsMarland Kitchen (214)[facility] Huguley STower Outpatient Surgery Center Inc Dba Tower OutpatieGlenbei19g50mt810 WilloMarland Kitchenw716[place] EndKeystone Endoscopy Center At Robinwood L27L58mMarland Kitchenr856[facility] CWashington Surgery Center I43n9mn883Marland KitchenC(7Las VegUhs Wilson [facility] Hospit54a63ms22[place] LoMarland Kitchenr(727WamSeaside BSelect Specialty Hospital - Tallahass40e57mh8BlaMarland Kitchenn(626[facility] HosMontefiore Medical Center-Wake[facility] Hospit88a90mi9Marland KitchenA8Premier OutpatieAmerican EndOakland Physican Surgery Cent54e43mKinMarland Kitcheng619St. Mary'S RegionSanford MediProgress West Healthcare Cent63eral CRowan BlaseerOBENATE DIMEGLUMINE 529 MG/ML IV SOLN COMPARISON:  03/31/2014 FINDINGS: MRI HEAD FINDINGS Calvarium and upper cervical spine: No focal marrow signal abnormality. Orbits: Negative. Sinuses and Mastoids: Clear. Brain: There is extensive patchy FLAIR hyperintensity in the bilateral cerebral white matter consistent with history of multiple sclerosis. Typical multiple sclerosis pattern with periventricular, juxta cortical, and infratentorial signal involvement. In the cerebral hemispheres there is no evidence of disease progression. Best seen on T2 weighted imaging, there is new signal abnormality in the right posterior lower and left ventral upper medulla compatible with disease progression. No abnormal intracranial enhancement. No acute or interval infarct, hemorrhage, hydrocephalus, or mass lesion. No major vessel occlusion. Stable cerebral volume.  Normal corpus callosum bulk. MRI CERVICAL SPINE FINDINGS Multiple short-segment partial cord signal abnormalities in the cervical spine compatible with multiple sclerosis. Stable lesions seen at: Left posterior column at C2-3. Left hemicord at the upper C4. Right posterior lateral cord at C5-6. Left posterior column at C6-7. Right hemi cord at T1. There is a 3 mm focus of enhancement on both axial and sagittal acquisition in the posterior midline cord at C5-6, compatible with an active plaque. Enhancement peripherally at the level of T1 and T2 cord could be intradural vessels -these are only seen on sagittal acquisition. The nerve roots have a prominent enhancement but stable appearance compared to prior. Small bilateral thyroid nodules. Stable disc narrowing and bulging from C3-4 to C6-7.  Disc bulging amd uncovertebral spurs at C3-4 causes chronic bilateral foraminal stenosis. No canal compromise. IMPRESSION: 1. Multiple sclerosis with intracranial and cord demyelination. Enhancement/active demyelination seen in the posterior cord at C5-6. 2. Since 2015 there is progression in the medulla. Stable cerebral involvement and white matter volume. No signs of active intracranial demyelination. Electronically Signed   By: Jonathon  Watts M.D.   On: 01/17/2016 14:56       Assessment & Plan:   Problem List Items Addressed This Visit    Abnormal Pap smear of cervix    Had been followed by Dr Livengood.  Was evaluated at [facility].  PAP 06/15/15 - negative pap with negative HPV.        Essential hypertension, benign    Blood pressure under good control.  Continue same medication regimen.  Follow pressures.  Follow metabolic panel.        Relevant Medications   metoprolol succinate (TOPROL-XL) 25 MG 24 hr tablet   GERD (  gastroesophageal reflux disease)    On nexium.  Symptoms controlled.        Hypercholesterolemia    Low cholesterol diet and exercise.  Follow lipid panel.        Relevant Medications   metoprolol succinate (TOPROL-XL) 25 MG 24 hr tablet   Multiple sclerosis (HCC)    Followed by Dr Sherryll Burger.  Recent MRI as outlined.  Was off tecfidera for a brief period.  Back on now.  No flares.  Follow.        Vitamin D deficiency    Follow vitamin D level.         Other Visit Diagnoses    Thyroid nodule    -  Primary    Relevant Medications    metoprolol succinate (TOPROL-XL) 25 MG 24 hr tablet    Other Relevant Orders    TSH (Completed)    T3, free (Completed)    T4, free (Completed)        Dale [place], MD

## 2016-01-28 NOTE — Progress Notes (Signed)
Pre-visit discussion using our clinic review tool. No additional management support is needed unless otherwise documented below in the visit note.  

## 2016-01-29 ENCOUNTER — Encounter: Payer: Self-pay | Admitting: Internal Medicine

## 2016-01-30 ENCOUNTER — Encounter: Payer: Self-pay | Admitting: Internal Medicine

## 2016-01-30 NOTE — Assessment & Plan Note (Signed)
Followed by Dr Sherryll Burger.  Recent MRI as outlined.  Was off tecfidera for a brief period.  Back on now.  No flares.  Follow.

## 2016-01-30 NOTE — Assessment & Plan Note (Signed)
Had been followed by Dr Dorma Russell.  Was evaluated at [facility].  PAP 06/15/15 - negative pap with negative HPV.

## 2016-01-30 NOTE — Assessment & Plan Note (Signed)
Blood pressure under good control.  Continue same medication regimen.  Follow pressures.  Follow metabolic panel.   

## 2016-01-30 NOTE — Assessment & Plan Note (Signed)
On nexium.  Symptoms controlled.   

## 2016-01-30 NOTE — Assessment & Plan Note (Signed)
Follow vitamin D level.  

## 2016-01-30 NOTE — Assessment & Plan Note (Signed)
Low cholesterol diet and exercise.  Follow lipid panel.   

## 2016-01-31 ENCOUNTER — Encounter: Payer: Self-pay | Admitting: Internal Medicine

## 2016-01-31 ENCOUNTER — Ambulatory Visit
Admission: EM | Admit: 2016-01-31 | Discharge: 2016-01-31 | Disposition: A | Discharge status: Home / Self Care | Payer: BC Managed Care – PPO | Attending: Family Medicine | Admitting: Family Medicine

## 2016-01-31 ENCOUNTER — Encounter: Payer: Self-pay | Admitting: *Deleted

## 2016-01-31 DIAGNOSIS — B349 Viral infection, unspecified: Secondary | ICD-10-CM

## 2016-01-31 DIAGNOSIS — R05 Cough: Secondary | ICD-10-CM

## 2016-01-31 DIAGNOSIS — R059 Cough, unspecified: Secondary | ICD-10-CM

## 2016-01-31 LAB — RAPID INFLUENZA A&B ANTIGENS (ARMC ONLY): INFLUENZA B (ARMC): NEGATIVE

## 2016-01-31 LAB — RAPID INFLUENZA A&B ANTIGENS: Influenza A (ARMC): NEGATIVE

## 2016-01-31 MED ORDER — AZITHROMYCIN 250 MG PO TABS: ORAL_TABLET | ORAL | Status: DC

## 2016-01-31 MED ORDER — OSELTAMIVIR PHOSPHATE 75 MG PO CAPS: 75.0000 mg | ORAL_CAPSULE | Freq: Two times a day (BID) | ORAL | Status: DC

## 2016-01-31 MED ORDER — ALBUTEROL SULFATE HFA 108 (90 BASE) MCG/ACT IN AERS: 1.0000 | INHALATION_SPRAY | Freq: Four times a day (QID) | RESPIRATORY_TRACT | Status: DC | PRN

## 2016-01-31 MED ORDER — GUAIFENESIN-CODEINE 100-10 MG/5ML PO SOLN: ORAL | Status: DC

## 2016-01-31 NOTE — Telephone Encounter (Signed)
See other my chart message.  Now with fever 101.  Needs evaluation.  See other note.

## 2016-01-31 NOTE — Telephone Encounter (Signed)
This is the second message pt has sent today.  Just comments on fever now 101.2.  Not sure if other symptoms, i.e., cough, congestion, diarrhea.  Given symptoms, would recommend evaluation to confirm diagnosis and then can determine treatment.  Recommend evaluation today.  [facility] acute [facility] if unable to work in here.  Thanks

## 2016-01-31 NOTE — ED Notes (Signed)
Productive cough- yellow, fever, chest soreness/pain with coughing, fever up to 101 onset Saturday, worse today.

## 2016-01-31 NOTE — ED Provider Notes (Signed)
CSN: [id number]     Arrival date & time 01/31/16  1856 History   First MD Initiated Contact with Patient 01/31/16 2001     Chief Complaint  Patient presents with  . Cough  . Pleurisy  . Generalized Body Aches  . Fever   (Consider location/radiation/quality/duration/timing/severity/associated sxs/prior Treatment) Patient is a 60 y.o. female presenting with URI. The history is provided by the patient.  URI Presenting symptoms: cough, fatigue, fever and rhinorrhea   Severity:  Moderate Onset quality:  Sudden Duration:  2 days Timing:  Constant Chronicity:  New Relieved by:  Nothing Ineffective treatments:  OTC medications Associated symptoms: myalgias, sinus pain and wheezing   Risk factors: immunosuppression (on immunemodulator for MS) and sick contacts   Risk factors: not elderly, no chronic cardiac disease, no chronic kidney disease, no chronic respiratory disease, no diabetes mellitus, no recent illness and no recent travel     Past Medical History  Diagnosis Date  . Multiple sclerosis (HCC)   . Reactive airway disease   . Hypertension   . Cervical disc disease     Cervical and lumbar disc disease  . Vitamin D deficiency   . History of iron deficiency   . GERD (gastroesophageal reflux disease)   . Abnormal Pap smear   . Personal history of colonic polyps    Past Surgical History  Procedure Laterality Date  . Total abdominal hysterectomy  2003    Right Oophrectomy  . Breast reduction surgery    . Reduction mammaplasty Bilateral 2002   Family History  Problem Relation Age of Onset  . Stroke Mother   . Hypertension Mother   . Heart disease Maternal Grandmother    Social History  Substance Use Topics  . Smoking status: Never Smoker   . Smokeless tobacco: Never Used  . Alcohol Use: 0.0 oz/week    0 Standard drinks or equivalent per week   OB History    No data available     Review of Systems  Constitutional: Positive for fever and fatigue.  HENT: Positive  for rhinorrhea.   Respiratory: Positive for cough and wheezing.   Musculoskeletal: Positive for myalgias.    Allergies  Review of patient's allergies indicates no known allergies.  Home Medications   Prior to Admission medications   Medication Sig Start Date End Date Taking? Authorizing Provider  Dimethyl Fumarate (TECFIDERA) 240 MG CPDR Take by mouth 2 (two) times daily.   Yes Historical Provider, MD  metoprolol succinate (TOPROL-XL) 25 MG 24 hr tablet Take 1 tablet (25 mg total) by mouth daily. 01/28/16  Yes Dale Boulevard, MD  valsartan-hydrochlorothiazide (DIOVAN-HCT) 320-25 MG tablet TAKE 1 TABLET BY MOUTH EVERY DAY 12/02/15  Yes Dale Hetland, MD  albuterol (PROVENTIL HFA;VENTOLIN HFA) 108 (90 Base) MCG/ACT inhaler Inhale 1-2 puffs into the lungs every 6 (six) hours as needed for wheezing or shortness of breath. 01/31/16   Payton Mccallum, MD  azithromycin (ZITHROMAX Z-PAK) 250 MG tablet 2 tabs po once day 1, then 1 tab po qd for next 4 days 01/31/16   Payton Mccallum, MD  guaiFENesin-codeine 100-10 MG/5ML syrup 1-2 teaspoons po qhs prn 01/31/16   Payton Mccallum, MD  guaiFENesin-codeine 100-10 MG/5ML syrup 1-2 teaspoons po qhs prn 01/31/16   Payton Mccallum, MD  NEXIUM 24HR 20 MG capsule TAKE 1 CAPSULE BY MOUTH DAILY BEFORE BREAKFAST 02/09/16   Dale , MD  oseltamivir (TAMIFLU) 75 MG capsule Take 1 capsule (75 mg total) by mouth 2 (two) times daily. 01/31/16  Payton Mccallum, MD   Meds Ordered and Administered this Visit  Medications - No data to display  Pulse 73  Temp(Src) 103.1 F (39.5 C) (Oral)  Resp 16  Ht  (1.727 m)  Wt 215 lb (97.523 kg)  BMI 32.70 kg/m2  SpO2 99%  LMP 12/27/2000 No data found.   Physical Exam  Constitutional: She appears well-developed and well-nourished. No distress.  HENT:  Head: Normocephalic and atraumatic.  Right Ear: Tympanic membrane, external ear and ear canal normal.  Left Ear: Tympanic membrane, external ear and ear canal normal.   Nose: Mucosal edema and rhinorrhea present. No nose lacerations, sinus tenderness, nasal deformity, septal deviation or nasal septal hematoma. No epistaxis.  No foreign bodies.  Mouth/Throat: Uvula is midline and mucous membranes are normal. Posterior oropharyngeal erythema present. No oropharyngeal exudate, posterior oropharyngeal edema or tonsillar abscesses.  Eyes: Conjunctivae and EOM are normal. Pupils are equal, round, and reactive to light. Right eye exhibits no discharge. Left eye exhibits no discharge. No scleral icterus.  Neck: Normal range of motion. Neck supple. No thyromegaly present.  Cardiovascular: Normal rate, regular rhythm and normal heart sounds.   Pulmonary/Chest: Effort normal. No respiratory distress. She has wheezes (diffuse expiratory wheezes and rhonchi). She has no rales.  Lymphadenopathy:    She has no cervical adenopathy.  Skin: She is not diaphoretic.  Nursing note and vitals reviewed.   ED Course  Procedures (including critical care time)  Labs Review Labs Reviewed  RAPID INFLUENZA A&B ANTIGENS [facility] ONLY)    Imaging Review No results found.   Visual Acuity Review  Right Eye Distance:   Left Eye Distance:   Bilateral Distance:    Right Eye Near:   Left Eye Near:    Bilateral Near:         MDM   1. Viral syndrome   2. Cough    Discharge Medication List as of 01/31/2016  8:18 PM    START taking these medications   Details  albuterol (PROVENTIL HFA;VENTOLIN HFA) 108 (90 Base) MCG/ACT inhaler Inhale 1-2 puffs into the lungs every 6 (six) hours as needed for wheezing or shortness of breath., Starting 01/31/2016, Until Discontinued, Print    guaiFENesin-codeine 100-10 MG/5ML syrup 1-2 teaspoons po qhs prn, Print    azithromycin (ZITHROMAX Z-PAK) 250 MG tablet 2 tabs po once day 1, then 1 tab po qd for next 4 days, Print    oseltamivir (TAMIFLU) 75 MG capsule Take 1 capsule (75 mg total) by mouth 2 (two) times daily., Starting 01/31/2016,  Until Discontinued, Print       1. Lab results and diagnosis reviewed with patient/parent/guardian/family 2. rx as per orders above; reviewed possible side effects, interactions, risks and benefits  3. Recommend supportive treatment with rest, increased fluids, otc analgesics prn 4. Follow-up prn if symptoms worsen or don't improve    Payton Mccallum, MD 02/16/16 [phone]

## 2016-02-08 ENCOUNTER — Other Ambulatory Visit: Payer: Self-pay | Admitting: Internal Medicine

## 2016-02-24 ENCOUNTER — Other Ambulatory Visit: Payer: Self-pay | Admitting: Internal Medicine

## 2016-02-28 ENCOUNTER — Other Ambulatory Visit: Payer: Self-pay | Admitting: Internal Medicine

## 2016-03-01 ENCOUNTER — Ambulatory Visit (INDEPENDENT_AMBULATORY_CARE_PROVIDER_SITE_OTHER): Payer: BC Managed Care – PPO | Admitting: Internal Medicine

## 2016-03-01 ENCOUNTER — Emergency Department: Payer: BC Managed Care – PPO

## 2016-03-01 ENCOUNTER — Encounter: Payer: Self-pay | Admitting: Medical Oncology

## 2016-03-01 ENCOUNTER — Encounter: Payer: Self-pay | Admitting: Internal Medicine

## 2016-03-01 ENCOUNTER — Emergency Department
Admission: EM | Admit: 2016-03-01 | Discharge: 2016-03-01 | Disposition: A | Discharge status: Home / Self Care | Payer: BC Managed Care – PPO | Attending: Emergency Medicine | Admitting: Emergency Medicine

## 2016-03-01 VITALS — BP 130/80 | HR 55 | Temp 98.7°F | Resp 18 | Ht 68.0 in | Wt 226.0 lb

## 2016-03-01 DIAGNOSIS — R0789 Other chest pain: Secondary | ICD-10-CM

## 2016-03-01 DIAGNOSIS — I10 Essential (primary) hypertension: Secondary | ICD-10-CM | POA: Insufficient documentation

## 2016-03-01 DIAGNOSIS — M5136 Other intervertebral disc degeneration, lumbar region: Secondary | ICD-10-CM | POA: Diagnosis not present

## 2016-03-01 DIAGNOSIS — K219 Gastro-esophageal reflux disease without esophagitis: Secondary | ICD-10-CM

## 2016-03-01 DIAGNOSIS — R079 Chest pain, unspecified: Secondary | ICD-10-CM

## 2016-03-01 DIAGNOSIS — Z79899 Other long term (current) drug therapy: Secondary | ICD-10-CM | POA: Insufficient documentation

## 2016-03-01 DIAGNOSIS — G35 Multiple sclerosis: Secondary | ICD-10-CM

## 2016-03-01 DIAGNOSIS — E7800 Pure hypercholesterolemia, unspecified: Secondary | ICD-10-CM | POA: Diagnosis not present

## 2016-03-01 LAB — CBC
HCT: 41 % (ref 35.0–47.0)
HEMOGLOBIN: 13.5 g/dL (ref 12.0–16.0)
MCH: 27.1 pg (ref 26.0–34.0)
MCHC: 33 g/dL (ref 32.0–36.0)
MCV: 82.1 fL (ref 80.0–100.0)
Platelets: 283 10*3/uL (ref 150–440)
RBC: 5 MIL/uL (ref 3.80–5.20)
RDW: 15.2 % — ABNORMAL HIGH (ref 11.5–14.5)
WBC: 4.2 10*3/uL (ref 3.6–11.0)

## 2016-03-01 LAB — BASIC METABOLIC PANEL
ANION GAP: 4 — AB (ref 5–15)
BUN: 10 mg/dL (ref 6–20)
CALCIUM: 9.4 mg/dL (ref 8.9–10.3)
CO2: 31 mmol/L (ref 22–32)
Chloride: 103 mmol/L (ref 101–111)
Creatinine, Ser: 0.66 mg/dL (ref 0.44–1.00)
Glucose, Bld: 90 mg/dL (ref 65–99)
POTASSIUM: 3.6 mmol/L (ref 3.5–5.1)
SODIUM: 138 mmol/L (ref 135–145)

## 2016-03-01 LAB — TROPONIN I

## 2016-03-01 LAB — FIBRIN DERIVATIVES D-DIMER (ARMC ONLY): FIBRIN DERIVATIVES D-DIMER (ARMC): 285 (ref 0–499)

## 2016-03-01 MED ORDER — METHYLPREDNISOLONE SODIUM SUCC 125 MG IJ SOLR
125.0000 mg | Freq: Once | INTRAMUSCULAR | Status: AC
  Administered 2016-03-01: 125 mg via INTRAVENOUS
  Filled 2016-03-01: qty 2

## 2016-03-01 MED ORDER — PREDNISONE 50 MG PO TABS: 100.0000 mg | ORAL_TABLET | Freq: Every day | ORAL | Status: AC

## 2016-03-01 NOTE — Assessment & Plan Note (Signed)
Followed by Dr Sherryll Burger.  Her symptoms could be c/w possible flare.  Need to confirm no other acute etiology (cardiac, etc) given the associated sob, chest tightness ,etc.  If w/up unrevealing, then he recommends IV solumedrol treatment.  Discussed with Dr Sherryll Burger.

## 2016-03-01 NOTE — ED Provider Notes (Signed)
Time Seen: Approximately *1400 I have reviewed the triage notes  Chief Complaint: Chest Pain   History of Present Illness: Jackie White is a 60 y.o. female who states that she's had some feeling of heaviness and mild tightness in her chest now consistently for the past week. She has a significant past medical: History of multiple sclerosis and is noticed some numbness on the entire left side of her body. She denies any focal weakness. She denies any headaches, trouble with speech or swallowing. She denies any fever or chills or a productive cough. She states she has had a mild nonproductive cough. Eyes any pulmonary emboli risk factors such as recent travel, history of deep venous thrombosis or pulmonary embolism disease, or significant family history. She states she does have a cardiac family history with her mom and her grandmother having heart disease. Seen and evaluated by her primary physician who advised that we felt we could treat her on an outpatient basis for the multiple sclerosis and recommended IV steroids.   Past Medical History  Diagnosis Date  . Multiple sclerosis (HCC)   . Reactive airway disease   . Hypertension   . Cervical disc disease     Cervical and lumbar disc disease  . Vitamin D deficiency   . History of iron deficiency   . GERD (gastroesophageal reflux disease)   . Abnormal Pap smear   . Personal history of colonic polyps     Patient Active Problem List   Diagnosis Date Noted  . Chest tightness 03/01/2016  . Rash 01/31/2015  . Health care maintenance 01/31/2015  . Left knee pain 10/12/2013  . GERD (gastroesophageal reflux disease) 12/29/2012  . Abnormal Pap smear of cervix 12/29/2012  . Essential hypertension, benign 12/25/2012  . Anemia 12/25/2012  . Degeneration of lumbar or lumbosacral intervertebral disc 12/25/2012  . Hypercholesterolemia 12/25/2012  . Vitamin D deficiency 12/25/2012  . Multiple sclerosis (HCC) 12/25/2012    Past Surgical  History  Procedure Laterality Date  . Total abdominal hysterectomy  2003    Right Oophrectomy  . Breast reduction surgery    . Reduction mammaplasty Bilateral 2002    Past Surgical History  Procedure Laterality Date  . Total abdominal hysterectomy  2003    Right Oophrectomy  . Breast reduction surgery    . Reduction mammaplasty Bilateral 2002    Current Outpatient Rx  Name  Route  Sig  Dispense  Refill  . albuterol (PROVENTIL HFA;VENTOLIN HFA) 108 (90 Base) MCG/ACT inhaler   Inhalation   Inhale 1-2 puffs into the lungs every 6 (six) hours as needed for wheezing or shortness of breath.   1 Inhaler   0   . Dimethyl Fumarate (TECFIDERA) 240 MG CPDR   Oral   Take by mouth 2 (two) times daily.         . metoprolol succinate (TOPROL-XL) 25 MG 24 hr tablet      TAKE 1 TABLET BY MOUTH EVERY DAY   90 tablet   3   . NEXIUM 24HR 20 MG capsule      TAKE 1 CAPSULE BY MOUTH DAILY BEFORE BREAKFAST   84 capsule   1   . valsartan-hydrochlorothiazide (DIOVAN-HCT) 320-25 MG tablet      TAKE 1 TABLET BY MOUTH EVERY DAY   90 tablet   3     Allergies:  Review of patient's allergies indicates no known allergies.  Family History: Family History  Problem Relation Age of Onset  . Stroke  Mother   . Hypertension Mother   . Heart disease Maternal Grandmother     Social History: Social History  Substance Use Topics  . Smoking status: Never Smoker   . Smokeless tobacco: Never Used  . Alcohol Use: 0.0 oz/week    0 Standard drinks or equivalent per week     Review of Systems:   10 point review of systems was performed and was otherwise negative:  Constitutional: No fever Eyes: No visual disturbances ENT: No sore throat, ear pain Cardiac: No chest pain Respiratory: No shortness of breath, wheezing, or stridor Abdomen: No abdominal pain, no vomiting, No diarrhea Endocrine: No weight loss, No night sweats Extremities: No peripheral edema, cyanosis Skin: No rashes, easy  bruising Neurologic: No focal weakness, trouble with speech or swollowing Urologic: No dysuria, Hematuria, or urinary frequency   Physical Exam:  ED Triage Vitals  Enc Vitals Group     BP 03/01/16 1321 145/72 mmHg     Pulse Rate 03/01/16 1321 57     Resp 03/01/16 1321 18     Temp 03/01/16 1321 97.9 F (36.6 C)     Temp Source 03/01/16 1321 Oral     SpO2 03/01/16 1321 97 %     Weight 03/01/16 1321 226 lb (102.513 kg)     Height 03/01/16 1321  (1.702 m)     Head Cir --      Peak Flow --      Pain Score 03/01/16 1321 4     Pain Loc --      Pain Edu? --      Excl. in GC? --     General: Awake , Alert , and Oriented times 3; GCS 15 Head: Normal cephalic , atraumatic Eyes: Pupils equal , round, reactive to light Nose/Throat: No nasal drainage, patent upper airway without erythema or exudate.  Neck: Supple, Full range of motion, No anterior adenopathy or palpable thyroid masses Lungs: Clear to ascultation without wheezes , rhonchi, or rales Heart: Regular rate, regular rhythm without murmurs , gallops , or rubs Abdomen: Soft, non tender without rebound, guarding , or rigidity; bowel sounds positive and symmetric in all 4 quadrants. No organomegaly .        Extremities: 2 plus symmetric pulses. No edema, clubbing or cyanosis Neurologic: normal ambulation, Motor symmetric without deficits, sensory intact Skin: warm, dry, no rashes   Labs:   All laboratory work was reviewed including any pertinent negatives or positives listed below:  Labs Reviewed  BASIC METABOLIC PANEL - Abnormal; Notable for the following:    Anion gap 4 (*)    All other components within normal limits  CBC - Abnormal; Notable for the following:    RDW 15.2 (*)    All other components within normal limits  TROPONIN I  FIBRIN DERIVATIVES D-DIMER ([facility] ONLY)   Laboratory work was reviewed and showed no clinically significant abnormalities.  EKG:  ED ECG REPORT I, Jennye Moccasin, the attending  physician, personally viewed and interpreted this ECG.  Date: 03/01/2016 EKG Time: 1326 Rate: 57 Rhythm: normal sinus rhythm QRS Axis: normal Intervals: normal ST/T Wave abnormalities: normal Conduction Disturbances: none Narrative Interpretation: unremarkable Sinus bradycardia otherwise normal EKG  Radiology:   Final result by Rad Results In Interface (03/01/16 13:59:19)   Narrative:   CLINICAL DATA: Chest tightness for 1 week. Tingling in LEFT arm.  EXAM: CHEST 2 VIEW  COMPARISON: None.  FINDINGS: The heart size and mediastinal contours are within normal limits.  Both lungs are clear. The visualized skeletal structures are unremarkable. Mild scoliosis convex RIGHT.  IMPRESSION: No active cardiopulmonary disease.   Electronically Signed       I personally reviewed the radiologic studies    ED Course: Differential includes all life-threatening causes for chest pain. This includes but is not exclusive to acute coronary syndrome, aortic dissection, pulmonary embolism, cardiac tamponade, community-acquired pneumonia, pericarditis, musculoskeletal chest wall pain, etc. In reviewing the patient's objective findings and discussion with her neurologist this may be an exacerbation of her multiple sclerosis. Patient was given IV steroids here in emergency department was discharged prescription for high-dose prednisone which she'll take 100 mg for 3 days and then taper can be established through the primary physician at that point    Assessment: * Acute unspecified chest pain Multiple sclerosis      Plan:  Outpatient management Patient was advised to return immediately if condition worsens. Patient was advised to follow up with their primary care physician or other specialized physicians involved in their outpatient care. The patient and/or family member/power of attorney had laboratory results reviewed at the bedside. All questions and concerns were addressed and  appropriate discharge instructions were distributed by the nursing staff.             Jennye Moccasin, MD 03/01/16 [phone]

## 2016-03-01 NOTE — Assessment & Plan Note (Signed)
She has tried to cut back on her nexium.  Cost is an issue.  Discussed needs to take daily and if need to will switch to generic protonix (to see if cheaper).

## 2016-03-01 NOTE — Discharge Instructions (Signed)

## 2016-03-01 NOTE — Patient Instructions (Signed)
ocrelimzumab

## 2016-03-01 NOTE — Assessment & Plan Note (Signed)
Chest tightness as outlined.  Notices intermittent sob.  Started a few days ago.  Escalated yesterday.  EKG obtained and revealed SR with non specific changes - anterior leads.  No acute ischemic changes.  Discussed unclear etiology for the tightness and the sob.  Need to confirm no cardiac source or other more acute issues.  Also discussed with Dr Sherryll Burger.  The arm and leg numbness and tingling concerning for MS flare.  If other acute issues ruled out, the tightness and sob, could be flare (seen with new spinal lesion).  Dr Sherryll Burger recommend IV solumedrol if above w/up unrevealing.  Discussed with pt and her friend.  Agree with ER evaluation.  Declined EMS transport.  Friend to take her straight to ER.  ER notified and discussed all of above.

## 2016-03-01 NOTE — Assessment & Plan Note (Signed)
Blood pressure as outlined.  Same medication regimen.  Follow.   

## 2016-03-01 NOTE — ED Notes (Signed)
Pt reports she has been having a heaviness/tightness feeling to the center of her chest x 1 week, along with this pain she also has been having numbness to the entire left side of her body.

## 2016-03-01 NOTE — Progress Notes (Signed)
Patient ID: Jackie White, female   DOB: 05-Jul-1956, 60 y.o.   MRN: [id number]   Subjective:    Patient ID: Jackie White, female    DOB: 1956/05/28, 60 y.o.   MRN: [id number]  HPI  Patient here as a work in with concerns regarding chest tightness.  She has a history of MS.  Noticed last week some left arm numbness and leg numbness.  She spoke with her neurologist.  Taking muscle relaxer.  No change in symptoms.  Over the last few days she has noticed being more sob and chest tightness.  Some occasional dry cough.  No chest congestion.  No wheezing.  No acid reflux. No abdominal pain or cramping.  No bowel change.  No back pain.  No leg swelling.     Past Medical History  Diagnosis Date  . Multiple sclerosis (HCC)   . Reactive airway disease   . Hypertension   . Cervical disc disease     Cervical and lumbar disc disease  . Vitamin D deficiency   . History of iron deficiency   . GERD (gastroesophageal reflux disease)   . Abnormal Pap smear   . Personal history of colonic polyps    Past Surgical History  Procedure Laterality Date  . Total abdominal hysterectomy  2003    Right Oophrectomy  . Breast reduction surgery    . Reduction mammaplasty Bilateral 2002   Family History  Problem Relation Age of Onset  . Stroke Mother   . Hypertension Mother   . Heart disease Maternal Grandmother    Social History   Social History  . Marital Status: Single    Spouse Name: N/A  . Number of Children: N/A  . Years of Education: N/A   Social History Main Topics  . Smoking status: Never Smoker   . Smokeless tobacco: Never Used  . Alcohol Use: 0.0 oz/week    0 Standard drinks or equivalent per week  . Drug Use: No  . Sexual Activity: Not Asked   Other Topics Concern  . None   Social History Narrative    Outpatient Encounter Prescriptions as of 03/01/2016  Medication Sig  . albuterol (PROVENTIL HFA;VENTOLIN HFA) 108 (90 Base) MCG/ACT inhaler Inhale 1-2 puffs into the lungs  every 6 (six) hours as needed for wheezing or shortness of breath.  . Dimethyl Fumarate (TECFIDERA) 240 MG CPDR Take by mouth 2 (two) times daily.  . metoprolol succinate (TOPROL-XL) 25 MG 24 hr tablet TAKE 1 TABLET BY MOUTH EVERY DAY  . NEXIUM 24HR 20 MG capsule TAKE 1 CAPSULE BY MOUTH DAILY BEFORE BREAKFAST  . valsartan-hydrochlorothiazide (DIOVAN-HCT) 320-25 MG tablet TAKE 1 TABLET BY MOUTH EVERY DAY  . [DISCONTINUED] azithromycin (ZITHROMAX Z-PAK) 250 MG tablet 2 tabs po once day 1, then 1 tab po qd for next 4 days  . [DISCONTINUED] guaiFENesin-codeine 100-10 MG/5ML syrup 1-2 teaspoons po qhs prn  . [DISCONTINUED] guaiFENesin-codeine 100-10 MG/5ML syrup 1-2 teaspoons po qhs prn  . [DISCONTINUED] oseltamivir (TAMIFLU) 75 MG capsule Take 1 capsule (75 mg total) by mouth 2 (two) times daily.   No facility-administered encounter medications on file as of 03/01/2016.    Review of Systems  Constitutional: Negative for fever and appetite change.  HENT: Negative for congestion and sinus pressure.   Respiratory: Positive for cough, chest tightness and shortness of breath.   Cardiovascular: Positive for chest pain. Negative for leg swelling.       Occasional fluttering.  Had two brief episodes.  Gastrointestinal: Negative for nausea, vomiting, abdominal pain and diarrhea.  Genitourinary: Negative for dysuria and difficulty urinating.  Musculoskeletal: Negative for myalgias and back pain.  Skin: Negative for color change and rash.  Neurological: Positive for numbness. Negative for dizziness, weakness, light-headedness (left leg and left arm) and headaches.  Psychiatric/Behavioral: Negative for dysphoric mood and agitation.       Objective:     Blood pressure rechecked by me:  132/82  Physical Exam  Constitutional: She appears well-developed and well-nourished. No distress.  HENT:  Nose: Nose normal.  Mouth/Throat: Oropharynx is clear and moist.  Neck: Neck supple.  Cardiovascular:  Normal rate and regular rhythm.   Pulmonary/Chest: Breath sounds normal. No respiratory distress. She has no wheezes.  No pain with deep inspiration.  No pain with palpation.   Abdominal: Soft. Bowel sounds are normal. There is no tenderness.  Musculoskeletal: She exhibits no edema or tenderness.  Lymphadenopathy:    She has no cervical adenopathy.  Skin: No rash noted. No erythema.  Psychiatric: She has a normal mood and affect. Her behavior is normal.    BP 130/80 mmHg  Pulse 55  Temp(Src) 98.7 F (37.1 C) (Oral)  Resp 18  Ht  (1.727 m)  Wt 226 lb (102.513 kg)  BMI 34.37 kg/m2  SpO2 97%  LMP 12/27/2000 Wt Readings from Last 3 Encounters:  03/01/16 226 lb (102.513 kg)  03/01/16 226 lb (102.513 kg)  01/31/16 215 lb (97.523 kg)     Lab Results  Component Value Date   WBC 4.2 03/01/2016   HGB 13.5 03/01/2016   HCT 41.0 03/01/2016   PLT 283 03/01/2016   GLUCOSE 90 03/01/2016   CHOL 178 01/26/2015   TRIG 69.0 01/26/2015   HDL 82.10 01/26/2015   LDLCALC 82 01/26/2015   ALT 17 07/30/2015   AST 17 07/30/2015   NA 138 03/01/2016   K 3.6 03/01/2016   CL 103 03/01/2016   CREATININE 0.66 03/01/2016   BUN 10 03/01/2016   CO2 31 03/01/2016   TSH 1.00 01/28/2016       Assessment & Plan:   Problem List Items Addressed This Visit    Chest tightness    Chest tightness as outlined.  Notices intermittent sob.  Started a few days ago.  Escalated yesterday.  EKG obtained and revealed SR with non specific changes - anterior leads.  No acute ischemic changes.  Discussed unclear etiology for the tightness and the sob.  Need to confirm no cardiac source or other more acute issues.  Also discussed with Dr Sherryll Burger.  The arm and leg numbness and tingling concerning for MS flare.  If other acute issues ruled out, the tightness and sob, could be flare (seen with new spinal lesion).  Dr Sherryll Burger recommend IV solumedrol if above w/up unrevealing.  Discussed with pt and her friend.  Agree with ER  evaluation.  Declined EMS transport.  Friend to take her straight to ER.  ER notified and discussed all of above.       Essential hypertension, benign    Blood pressure as outlined.  Same medication regimen.  Follow.        GERD (gastroesophageal reflux disease)    She has tried to cut back on her nexium.  Cost is an issue.  Discussed needs to take daily and if need to will switch to generic protonix (to see if cheaper).        Multiple sclerosis (HCC)    Followed by Dr Sherryll Burger.  Her symptoms could be c/w possible flare.  Need to confirm no other acute etiology (cardiac, etc) given the associated sob, chest tightness ,etc.  If w/up unrevealing, then he recommends IV solumedrol treatment.  Discussed with Dr Sherryll Burger.         Other Visit Diagnoses    Chest pain, unspecified chest pain type    -  Primary    Relevant Orders    EKG 12-Lead (Completed)      I spent 40 minutes with the patient and more than 50% of the time was spent in consultation regarding the above.     Dale [place], MD

## 2016-03-01 NOTE — Progress Notes (Signed)
Pre-visit discussion using our clinic review tool. No additional management support is needed unless otherwise documented below in the visit note.  

## 2016-03-02 ENCOUNTER — Encounter: Payer: Self-pay | Admitting: Internal Medicine

## 2016-03-04 MED ORDER — PREDNISONE 20 MG PO TABS: ORAL_TABLET | ORAL | Status: DC

## 2016-03-04 NOTE — Telephone Encounter (Signed)
Discussed with pt Dr Margaretmary Eddy instructions for taper for MS flare.  She  Has taken prednisone 100mg  the last two days and will take 100mg  today.  I will call in for her to take 100mg  for two more days after today and then she is to decrease to 60mg  x 1 day, 50mg  x 1 day, 40mg  x 1 day, 30mg  x 1 day, 20mg  x 1 day, 10mg  x 1 day then stop.  Call with update beginning of next week.   rx sent in to pharmacy.

## 2016-03-05 MED ORDER — PREDNISONE 20 MG PO TABS: ORAL_TABLET | ORAL | Status: DC

## 2016-03-05 NOTE — Addendum Note (Signed)
Addended by: Charm Barges on: 03/05/2016 10:23 AM   Modules accepted: Orders

## 2016-03-05 NOTE — Telephone Encounter (Signed)
rx sent to pharmacy.   Accidentally printed initial prescription.  Pt notified via my chart.

## 2016-05-16 ENCOUNTER — Encounter: Payer: Self-pay | Admitting: Internal Medicine

## 2016-05-16 NOTE — Telephone Encounter (Signed)
I will need to see her before I can do an xray.   I can see her tomorrow at 11:00.  Please schedule appt. Hold for her until can get in touch with her.  Thanks

## 2016-05-17 ENCOUNTER — Encounter: Payer: Self-pay | Admitting: Internal Medicine

## 2016-05-17 ENCOUNTER — Ambulatory Visit (INDEPENDENT_AMBULATORY_CARE_PROVIDER_SITE_OTHER): Payer: BC Managed Care – PPO | Admitting: Internal Medicine

## 2016-05-17 ENCOUNTER — Ambulatory Visit (INDEPENDENT_AMBULATORY_CARE_PROVIDER_SITE_OTHER): Payer: BC Managed Care – PPO

## 2016-05-17 VITALS — BP 120/70 | HR 67 | Temp 97.7°F | Resp 18 | Ht 68.0 in | Wt 233.4 lb

## 2016-05-17 DIAGNOSIS — M25551 Pain in right hip: Secondary | ICD-10-CM

## 2016-05-17 DIAGNOSIS — M545 Low back pain, unspecified: Secondary | ICD-10-CM

## 2016-05-17 NOTE — Progress Notes (Signed)
Pre-visit discussion using our clinic review tool. No additional management support is needed unless otherwise documented below in the visit note.  

## 2016-05-17 NOTE — Progress Notes (Signed)
Patient ID: Jackie White, female   DOB: Jan 16, 1956, 60 y.o.   MRN: [id number]   Subjective:    Patient ID: Jackie White, female    DOB: Jul 23, 1956, 60 y.o.   MRN: [id number]  HPI  Patient here as a work in with concerns regarding persistent hip pain s/p fall.  States she fell down stairs a few weeks ago.  Having persistent right hip pain and low back pain.  States she grabbed the rail with the fall.  Noticed some pain in her wrist initially.  This has resolved.  The back/hip started after the fall and has been persistent.  No pain radiating down her leg.  No numbness or tingling.  Taking advil/alleve  Only taking one in the am and one in the pm.     Past Medical History  Diagnosis Date  . Multiple sclerosis (HCC)   . Reactive airway disease   . Hypertension   . Cervical disc disease     Cervical and lumbar disc disease  . Vitamin D deficiency   . History of iron deficiency   . GERD (gastroesophageal reflux disease)   . Abnormal Pap smear   . Personal history of colonic polyps    Past Surgical History  Procedure Laterality Date  . Total abdominal hysterectomy  2003    Right Oophrectomy  . Breast reduction surgery    . Reduction mammaplasty Bilateral 2002   Family History  Problem Relation Age of Onset  . Stroke Mother   . Hypertension Mother   . Heart disease Maternal Grandmother    Social History   Social History  . Marital Status: Single    Spouse Name: N/A  . Number of Children: N/A  . Years of Education: N/A   Social History Main Topics  . Smoking status: Never Smoker   . Smokeless tobacco: Never Used  . Alcohol Use: 0.0 oz/week    0 Standard drinks or equivalent per week  . Drug Use: No  . Sexual Activity: Not Asked   Other Topics Concern  . None   Social History Narrative    Outpatient Encounter Prescriptions as of 05/17/2016  Medication Sig  . albuterol (PROVENTIL HFA;VENTOLIN HFA) 108 (90 Base) MCG/ACT inhaler Inhale 1-2 puffs into the lungs  every 6 (six) hours as needed for wheezing or shortness of breath.  . Cyanocobalamin (RA VITAMIN B-12 TR) 1000 MCG TBCR Take by mouth.  . Dimethyl Fumarate (TECFIDERA) 240 MG CPDR Take by mouth 2 (two) times daily.  . metoprolol succinate (TOPROL-XL) 25 MG 24 hr tablet TAKE 1 TABLET BY MOUTH EVERY DAY  . NEXIUM 24HR 20 MG capsule TAKE 1 CAPSULE BY MOUTH DAILY BEFORE BREAKFAST  . predniSONE (DELTASONE) 20 MG tablet Take 5 tablets x 2 more days and then three tablets x one day and then decrease by 1/2 tablet per day until down to zero mg.  (so will be -  100mg  x 2 days and the 60mg  x 1 day, 50mg  x 1 day, 40mg  x 1 day, 30mg  x 1 day, 20mg  x 1 day, 10mg  x 1 day and then stop).  . valsartan-hydrochlorothiazide (DIOVAN-HCT) 320-25 MG tablet TAKE 1 TABLET BY MOUTH EVERY DAY  . VITAMIN D, ERGOCALCIFEROL, PO Take by mouth.   No facility-administered encounter medications on file as of 05/17/2016.    Review of Systems  Constitutional: Negative for appetite change and unexpected weight change.  Respiratory: Negative for chest tightness and shortness of breath.   Gastrointestinal: Negative for  nausea and vomiting.  Musculoskeletal: Positive for back pain.       Back and hip pain as outlined.   Skin: Negative for color change and rash.  Psychiatric/Behavioral: Negative for dysphoric mood and agitation.       Objective:    Physical Exam  Constitutional: She appears well-developed and well-nourished. No distress.  Neck: Neck supple.  Cardiovascular: Normal rate and regular rhythm.   Pulmonary/Chest: Breath sounds normal. No respiratory distress. She has no wheezes.  Musculoskeletal: She exhibits no edema or tenderness.  Some increased pain with straight leg raise - lower back.  No motor weakness.    Lymphadenopathy:    She has no cervical adenopathy.  Psychiatric: She has a normal mood and affect. Her behavior is normal.    BP 120/70 mmHg  Pulse 67  Temp(Src) 97.7 F (36.5 C) (Oral)  Resp 18   Ht  (1.727 m)  Wt 233 lb 6 oz (105.858 kg)  BMI 35.49 kg/m2  SpO2 97%  LMP 12/27/2000 Wt Readings from Last 3 Encounters:  05/17/16 233 lb 6 oz (105.858 kg)  03/01/16 226 lb (102.513 kg)  03/01/16 226 lb (102.513 kg)     Lab Results  Component Value Date   WBC 4.2 03/01/2016   HGB 13.5 03/01/2016   HCT 41.0 03/01/2016   PLT 283 03/01/2016   GLUCOSE 90 03/01/2016   CHOL 178 01/26/2015   TRIG 69.0 01/26/2015   HDL 82.10 01/26/2015   LDLCALC 82 01/26/2015   ALT 17 07/30/2015   AST 17 07/30/2015   NA 138 03/01/2016   K 3.6 03/01/2016   CL 103 03/01/2016   CREATININE 0.66 03/01/2016   BUN 10 03/01/2016   CO2 31 03/01/2016   TSH 1.00 01/28/2016    Dg Chest 2 View  03/01/2016  CLINICAL DATA:  Chest tightness for 1 week.  Tingling in LEFT arm. EXAM: CHEST  2 VIEW COMPARISON:  None. FINDINGS: The heart size and mediastinal contours are within normal limits. Both lungs are clear. The visualized skeletal structures are unremarkable. Mild scoliosis convex RIGHT. IMPRESSION: No active cardiopulmonary disease. Electronically Signed   By: Elsie Stain M.D.   On: 03/01/2016 13:59       Assessment & Plan:   Problem List Items Addressed This Visit    Low back pain - Primary    Persistent pain s/p fall.  Check xray.  May need therapy.        Relevant Orders   DG Lumbar Spine 2-3 Views (Completed)   DG HIP UNILAT WITH PELVIS 2-3 VIEWS RIGHT (Completed)   Right hip pain    Persistent pain s/p fall.  Check xray.        Relevant Orders   DG Lumbar Spine 2-3 Views (Completed)   DG HIP UNILAT WITH PELVIS 2-3 VIEWS RIGHT (Completed)       Dale [place], MD

## 2016-05-18 ENCOUNTER — Other Ambulatory Visit: Payer: Self-pay | Admitting: Internal Medicine

## 2016-05-18 DIAGNOSIS — M545 Low back pain, unspecified: Secondary | ICD-10-CM

## 2016-05-18 DIAGNOSIS — M25551 Pain in right hip: Secondary | ICD-10-CM

## 2016-05-18 NOTE — Progress Notes (Signed)
Order placed for physical therapy.  

## 2016-05-20 ENCOUNTER — Encounter: Payer: Self-pay | Admitting: Internal Medicine

## 2016-05-20 NOTE — Assessment & Plan Note (Signed)
Persistent pain s/p fall.  Check xray.   

## 2016-05-20 NOTE — Assessment & Plan Note (Signed)
Persistent pain s/p fall.  Check xray.  May need therapy.

## 2016-06-08 ENCOUNTER — Encounter: Payer: Self-pay | Admitting: Physical Therapy

## 2016-06-08 ENCOUNTER — Ambulatory Visit: Payer: BC Managed Care – PPO | Attending: Internal Medicine | Admitting: Physical Therapy

## 2016-06-08 DIAGNOSIS — M6281 Muscle weakness (generalized): Secondary | ICD-10-CM | POA: Diagnosis present

## 2016-06-08 DIAGNOSIS — M25551 Pain in right hip: Secondary | ICD-10-CM | POA: Insufficient documentation

## 2016-06-08 DIAGNOSIS — M25651 Stiffness of right hip, not elsewhere classified: Secondary | ICD-10-CM

## 2016-06-08 DIAGNOSIS — M545 Low back pain, unspecified: Secondary | ICD-10-CM

## 2016-06-08 NOTE — Therapy (Signed)
[facility] PH[facility]ORTS [facility]ICINE [address] [place], [address] Phone: [phone]   Fax:  [phone]  Physical Therapy Evaluation  Patient Details  Name: ARVETTA ARAQUE MRN: [id number] Date of Birth: 07/11/1956 Referring Provider: Dale Mount Vernon MD  Encounter Date: 06/08/2016      PT End of Session - 06/08/16 1024    Visit Number 1   Number of Visits 12   Date for PT Re-Evaluation 07/20/16   PT Start Time 0850   PT Stop Time 0948   PT Time Calculation (min) 58 min   Activity Tolerance Patient tolerated treatment well;Patient limited by pain   Behavior During Therapy [facility] for tasks assessed/performed      Past Medical History  Diagnosis Date  . Multiple sclerosis (HCC)   . Reactive airway disease   . Hypertension   . Cervical disc disease     Cervical and lumbar disc disease  . Vitamin D deficiency   . History of iron deficiency   . GERD (gastroesophageal reflux disease)   . Abnormal Pap smear   . Personal history of colonic polyps     Past Surgical History  Procedure Laterality Date  . Total abdominal hysterectomy  2003    Right Oophrectomy  . Breast reduction surgery    . Reduction mammaplasty Bilateral 2002    There were no vitals filed for this visit.       Subjective Assessment - 06/08/16 0910    Subjective Pt presents with increased righted sided low back and hip pain starting in early May after falling down the stairs. Pt reports difficulty and pain with transfering from sitting to standing, climbing the stairs, lifting heavy items, and bending. States alleviation of pain with lying down and sitting. Pt states symptoms are worse during the day and has pain in the middle of the night when rolling onto the affected side. Patient reports the worst pain is a 8/10 and the best is a 5/10.   Pertinent History ~10 years of ight low back and hip pain; recent exacerbation of symptoms with a fall in May 2017,  diagnosis of MS for 10 years (exacerbating, remitting)   Diagnostic tests X-ray : negative for fx   Patient Stated Goals Return to gym exercises, decrease pain when transferring   Currently in Pain? Yes   Pain Score 5    Pain Location Back  R Hip: 5/10   Pain Orientation Right   Pain Descriptors / Indicators Aching;Dull   Pain Type Chronic pain;Acute pain   Pain Onset More than a month ago   Pain Frequency Constant   Aggravating Factors  Transferring out of current position   Pain Relieving Factors heat, lying down            [facility] PT Assessment - 06/08/16 0902    Assessment   Medical Diagnosis M54.5 Right-sided low back pain without sciatica; M25.551` Right hip pain   Referring Provider Dale  MD   Onset Date/Surgical Date 03/20/16   Hand Dominance Right   Next MD Visit unknown   Prior Therapy none   Balance Screen   Has the patient fallen in the past 6 months Yes   How many times? 1   Has the patient had a decrease in activity level because of a fear of falling?  Yes   Is the patient reluctant to leave their home because of a fear of falling?  No   Home Environment   Living  Environment Private residence   Prior Function   Level of Independence Independent   Vocation Full time employment   Vocation Requirements Sitting,   Leisure exercise, traveling, gardening   Cognition   Overall Cognitive Status Within Functional Limits for tasks assessed       Objective: Observation: Increased hip flexion when lying prone, sitting, and standing. Ambulation: Antalgic gait pattern pattern with decreased stance time on the R versus the L.  Palpation: Increased TTP over the glute med, quadratus lumborum, glute max, and TFL on the right side.   Measurement:  Nerve Exam: Sensation: WNL for L1-S2; MMT:  L2: 4-/5 on R; WNL on L, L3:WNL B, L4:WNL B, L5:WNL B, S1: WNL B Reflexes: L4: Decreased bilaterally, S1:Decreased bilaterally Lumbar AROM/MMT: Flexion: 25% limitation ,  Extension: 25% limitation, Left lateral flexion: 25% limitation increased pain on the R, Right lateral flexion: WNL, Right rotation: WNL, Left rotation: 25% limitation increased pain on the R R Hip AROM/MMT: Flexion:WNL minor increase in pain, Extension:3/5 -- increased pain, ABD:3/5 -- increased pain, ER:4-/5 increased pain,  L Hip AROM/MMT: WNL for all motions tested.   Special Testing: Negative: crossed straight leg raise, FABER -- bilaterally Positive: FADIR -- on R   Outcome Measures:  MODI: 24% (Moderate self perceived disability)  Therapeutic Exercise: Patient performed exercises with guidance, verbal and tactile cues and demonstration of therapist: Seated clamshells with red band -- x15 Seated ball squeeze/glute squeeze -- x15 Sidelying clamshells -- x10 B  Patient response to treatment: No increase or aggravation of symptoms reported during or after the treatment session. Good demonstrate on seated ball squeeze/glute squeeze requiring minimal verbal cueing on appropriate muscle activation to perform with proper technique.           PT Education - 06/08/16 1028    Education provided Yes   Education Details HEP: seated and sidelying clamshells, ball squeeze/glute squeeze   Person(s) Educated Patient   Methods Explanation;Demonstration   Comprehension Verbalized understanding;Returned demonstration             PT Long Term Goals - 06/08/16 1539    PT LONG TERM GOAL #1   Title Pt will score a 14% on the MODI by 07/20/16 to demonstrate significant improvement in low back function and improved ability to transfer from supine to sitting without pain.   Baseline MODI: 24% dysfunction   Status New   PT LONG TERM GOAL #2   Title Patient will be independent with HEP focused on improving muscular endurance, coordination, and strength by 07/20/16 to continue benefits of therapy after discharge.   Baseline dependent for exercise performance and progression.    Status New    PT LONG TERM GOAL #3   Title Patient will be able to transfer from sitting to standing without pain/difficulty by 07/20/16 to demonstrate functional improvement in lumbar function.   Baseline Increased pain and difficulty when performing sitting to standing.    Status New               Plan - 06/08/16 1557    Clinical Impression Statement Patient is a 60 yo right hand dominant female experiencing increased right sided low back and hip pain after a fall in early May 2017. Patient demonstrates an increased MODI (24%), increased pain with hip and low back, and a positive FADIR test indicating lumbar dysfunction with possible soft tissue involvement. Patient also demonstrate significant TTP over the glute med, TFL, and glute max on the right side also indicating dysfunction.  Patient demonstrates decreased muscular strength and endurance with hip and lumbar motions and will benefit from further skilled therapy focused on improving these limitations to return to prior level of function.   Rehab Potential Good   Clinical Impairments Affecting Rehab Potential (+) highly motivated, family support (-) chronicity of impairment, history of MS   PT Frequency 2x / week   PT Duration 6 weeks   PT Treatment/Interventions Iontophoresis 4mg /ml Dexamethasone;Therapeutic activities;Therapeutic exercise;Moist Heat;Manual techniques;Patient/family education;Passive range of motion;Cryotherapy;Neuromuscular re-education;Ultrasound   PT Next Visit Plan hip/core stabilization, manual therapy techniques   PT Home Exercise Plan ball squeeze/glute squeeze, seated clamshells, sidelying clamsheells   Consulted and Agree with Plan of Care Patient      Patient will benefit from skilled therapeutic intervention in order to improve the following deficits and impairments:  Pain, Decreased strength, Decreased mobility, Difficulty walking, Decreased range of motion, Increased fascial restricitons, Decreased endurance,  Increased muscle spasms, Hypomobility  Visit Diagnosis: Right-sided low back pain without sciatica - Plan: PT plan of care cert/re-cert  Pain in right hip - Plan: PT plan of care cert/re-cert  Stiffness of right hip, not elsewhere classified - Plan: PT plan of care cert/re-cert     Problem List Patient Active Problem List   Diagnosis Date Noted  . Low back pain 05/17/2016  . Right hip pain 05/17/2016  . Chest tightness 03/01/2016  . Rash 01/31/2015  . Health care maintenance 01/31/2015  . Left knee pain 10/12/2013  . GERD (gastroesophageal reflux disease) 12/29/2012  . Abnormal Pap smear of cervix 12/29/2012  . Essential hypertension, benign 12/25/2012  . Anemia 12/25/2012  . Degeneration of lumbar or lumbosacral intervertebral disc 12/25/2012  . Hypercholesterolemia 12/25/2012  . Vitamin D deficiency 12/25/2012  . Multiple sclerosis (HCC) 12/25/2012    Myrene Galas, SPT 06/09/2016, 12:17 PM  [facility] PHYSICAL AND SPORTS MEDICINE [address], [place], [address] Phone: [phone]   Fax:  [phone]  Name: HESSIE ELTING MRN: [id number] Date of Birth: Jan 11, 1956

## 2016-06-14 ENCOUNTER — Encounter: Payer: BC Managed Care – PPO | Admitting: Physical Therapy

## 2016-06-15 ENCOUNTER — Encounter: Payer: Self-pay | Admitting: Physical Therapy

## 2016-06-15 ENCOUNTER — Ambulatory Visit: Payer: BC Managed Care – PPO | Admitting: Physical Therapy

## 2016-06-15 DIAGNOSIS — M25551 Pain in right hip: Secondary | ICD-10-CM

## 2016-06-15 DIAGNOSIS — M6281 Muscle weakness (generalized): Secondary | ICD-10-CM

## 2016-06-15 DIAGNOSIS — M545 Low back pain, unspecified: Secondary | ICD-10-CM

## 2016-06-16 NOTE — Therapy (Signed)
[place] [facility] PH[facility]ORTS MEDICINE [address], [place], [address] Phone: [phone]   Fax:  [phone]  Physical Therapy Treatment  Patient Details  Name: Jackie White MRN: [id number] Date of Birth: Jul 29, 1956 Referring Provider: Dale Mount Eaton MD  Encounter Date: 06/15/2016      PT End of Session - 06/15/16 1541    Visit Number 2   Number of Visits 12   Date for PT Re-Evaluation 07/20/16   PT Start Time 1531   PT Stop Time 1615   PT Time Calculation (min) 44 min   Activity Tolerance Patient tolerated treatment well;Patient limited by pain   Behavior During Therapy [facility] for tasks assessed/performed      Past Medical History:  Diagnosis Date  . Abnormal Pap smear   . Cervical disc disease    Cervical and lumbar disc disease  . GERD (gastroesophageal reflux disease)   . History of iron deficiency   . Hypertension   . Multiple sclerosis (HCC)   . Personal history of colonic polyps   . Reactive airway disease   . Vitamin D deficiency     Past Surgical History:  Procedure Laterality Date  . BREAST REDUCTION SURGERY    . REDUCTION MAMMAPLASTY Bilateral 2002  . TOTAL ABDOMINAL HYSTERECTOMY  2003   Right Oophrectomy    There were no vitals filed for this visit.      Subjective Assessment - 06/15/16 1535    Subjective Patient reports she is exercising as instructed and is still in constant pain and would like to stop the pain and be able to get up from a chair or the bed without difficulty.    Limitations Sitting;Standing;House hold activities;Walking   Diagnostic tests X-ray : negative for fx   Patient Stated Goals Return to gym exercises, decrease pain when transferring   Currently in Pain? Yes   Pain Score 5    Pain Location Back  and right hip   Pain Orientation Right   Pain Descriptors / Indicators Aching;Dull   Pain Type Chronic pain;Acute pain   Pain Onset More than a month ago   Pain Frequency  Constant      Objective; Observation; transfers sit to stand with cautious slow movement Gait: forward flexed at hips, bilateral knee flexion Strength; decreased bilateral hip extension and ER strength 3/5, decreased knee extension strength bilaterally Palpation; point tender along right hip gluteal medius and TFL  Treatment:  Therapeutic exercise:  patient performed exercises with guidance, verbal and tactile cues and demonstration of PT: Sitting:  Hip adduction with ball with glute sets x 15 reps Hip abduction with resistive band x 15 reps  Side lying: Clamshells with assistance to perform with correct hip/trunk alignment 2 x 5 reps each LE  Prone lying:  Glute sets x 10 Knee extension with quad setting and glute sets x 10 each LE  Standing:  Hip abduction and extension 3 x 5 reps with controlled motion Diagonal wedding march holding (2 ) 3# weights in hands x 2 min.  Patient response to treatment: Patient demonstrated improved technique with exercises with verbal and tactile cues for clamshells and was able to perform all exercises with mild pain in right hip, no worse at end of session       PT Education - 06/15/16 1540    Education provided Yes   Education Details HEP: clamshells, ball hip adduction with glute sets, hip abduction with resistive band, standing and walking exercises  Methods Explanation;Demonstration;Verbal cues;Handout   Comprehension Verbalized understanding;Returned demonstration;Verbal cues required             PT Long Term Goals - 06/08/16 1539      PT LONG TERM GOAL #1   Title Pt will score a 14% on the MODI by 07/20/16 to demonstrate significant improvement in low back function and improved ability to transfer from supine to sitting without pain.   Baseline MODI: 24% dysfunction   Status New     PT LONG TERM GOAL #2   Title Patient will be independent with HEP focused on improving muscular endurance, coordination, and strength by  07/20/16 to continue benefits of therapy after discharge.   Baseline dependent for exercise performance and progression.    Status New     PT LONG TERM GOAL #3   Title Patient will be able to transfer from sitting to standing without pain/difficulty by 07/20/16 to demonstrate functional improvement in lumbar function.   Baseline Increased pain and difficulty when performing sitting to standing.    Status New               Plan - 06/15/16 1541    Clinical Impression Statement Patient continues with pain and limited ability to transfer without difficulty and pain in lower back and hips. She responded with decreased stiffness and improved ability to stand and walk with less difficulty at end of session indicating improved strength and flexibility and decreased pain following treatment. She will benefit from additional physical therapy to address pain and weakness to allow improved mobility and function for daily tasks.    Rehab Potential Good   PT Frequency 2x / week   PT Duration 6 weeks   PT Treatment/Interventions Iontophoresis 4mg /ml Dexamethasone;Therapeutic activities;Therapeutic exercise;Moist Heat;Manual techniques;Patient/family education;Passive range of motion;Cryotherapy;Neuromuscular re-education;Ultrasound   PT Next Visit Plan hip/core stabilization, manual therapy techniques   PT Home Exercise Plan ball squeeze/glute squeeze, seated clamshells, sidelying clamsheells      Patient will benefit from skilled therapeutic intervention in order to improve the following deficits and impairments:  Pain, Decreased strength, Decreased mobility, Difficulty walking, Decreased range of motion, Increased fascial restricitons, Decreased endurance, Increased muscle spasms, Hypomobility  Visit Diagnosis: Right-sided low back pain without sciatica  Pain in right hip  Muscle weakness (generalized)     Problem List Patient Active Problem List   Diagnosis Date Noted  . Low back pain  05/17/2016  . Right hip pain 05/17/2016  . Chest tightness 03/01/2016  . Rash 01/31/2015  . Health care maintenance 01/31/2015  . Left knee pain 10/12/2013  . GERD (gastroesophageal reflux disease) 12/29/2012  . Abnormal Pap smear of cervix 12/29/2012  . Essential hypertension, benign 12/25/2012  . Anemia 12/25/2012  . Degeneration of lumbar or lumbosacral intervertebral disc 12/25/2012  . Hypercholesterolemia 12/25/2012  . Vitamin D deficiency 12/25/2012  . Multiple sclerosis (HCC) 12/25/2012    [name] PT 06/16/2016, 12:14 PM  [facility] PHYSICAL AND SPORTS MEDICINE [address] [place], [address] Phone: [phone]   Fax:  [phone]  Name: Jackie White MRN: [id number] Date of Birth: 11-16-1956

## 2016-06-20 ENCOUNTER — Encounter: Payer: Self-pay | Admitting: Physical Therapy

## 2016-06-20 ENCOUNTER — Ambulatory Visit: Payer: BC Managed Care – PPO | Attending: Internal Medicine | Admitting: Physical Therapy

## 2016-06-20 DIAGNOSIS — M25651 Stiffness of right hip, not elsewhere classified: Secondary | ICD-10-CM | POA: Insufficient documentation

## 2016-06-20 DIAGNOSIS — M545 Low back pain, unspecified: Secondary | ICD-10-CM

## 2016-06-20 DIAGNOSIS — M25551 Pain in right hip: Secondary | ICD-10-CM | POA: Insufficient documentation

## 2016-06-20 DIAGNOSIS — M6281 Muscle weakness (generalized): Secondary | ICD-10-CM | POA: Diagnosis present

## 2016-06-20 NOTE — Therapy (Signed)
[facility] [facility] PHYSICAL [facility] SPORTS MEDICINE [address], [place], [address] Phone: [phone]   Fax:  [phone]  Physical Therapy Treatment  Patient Details  Name: Jackie White MRN: [id number] Date of Birth: 22-Jun-1956 Referring Provider: Dale Pisgah MD  Encounter Date: 06/20/2016      PT End of Session - 06/20/16 0858    Visit Number 3   Number of Visits 12   Date for PT Re-Evaluation 07/20/16   PT Start Time 0816   PT Stop Time 0858   PT Time Calculation (min) 42 min   Activity Tolerance Patient tolerated treatment well   Behavior During Therapy [facility] for tasks assessed/performed      Past Medical History:  Diagnosis Date  . Abnormal Pap smear   . Cervical disc disease    Cervical and lumbar disc disease  . GERD (gastroesophageal reflux disease)   . History of iron deficiency   . Hypertension   . Multiple sclerosis (HCC)   . Personal history of colonic polyps   . Reactive airway disease   . Vitamin D deficiency     Past Surgical History:  Procedure Laterality Date  . BREAST REDUCTION SURGERY    . REDUCTION MAMMAPLASTY Bilateral 2002  . TOTAL ABDOMINAL HYSTERECTOMY  2003   Right Oophrectomy    There were no vitals filed for this visit.      Subjective Assessment - 06/20/16 0816    Subjective Patient reports she is noticing improvement slowly. Exercise with ball squeeze, glute squeeze seems to be helping.    Limitations Sitting;Standing;House hold activities;Walking   Patient Stated Goals Return to gym exercises, decrease pain when transferring   Currently in Pain? Yes   Pain Score 5    Pain Location Back  and right hip (note feels like a "10" first thing in the morning)   Pain Orientation Right   Pain Descriptors / Indicators Aching;Dull   Pain Type Chronic pain   Pain Onset More than a month ago      Objective; Posture: mild forward flexion at hips Gait: decreased trunk rotation  Treatment:   Therapeutic exercise:  patient performed exercises with guidance, verbal and tactile cues and demonstration of PT: Sitting:  Hip adduction with ball with glute sets x 15 reps with 5 second holds Hip abduction with green resistive band x 25 reps Lumbar extension with resistive band sitting in chair x 10 reps Side lying: Clamshells with assistance to perform with correct hip/trunk alignment x 10 reps each LE  Prone lying:  Knee extension with quad setting and glute sets x 10 each LE  Standing:  Hip abduction and extension with green resistive band around thighs 3 x 5 reps with controlled motion Diagonal wedding march holding (2 ) 3# weights in hands x 2 min. Walk against resistive band forward x 10, backwards x 10  Patient response to treatment: improved technique with clamshells with tactile and verbal cues for correct hip/trunk alignment, improved ability to transfer sit to stand and walk with less difficulty, improved gait pattern and decreased pain at end of session    Patient Education: HEP with handout given: walk against resistive band, lumbar extension with resistive band Demonstration, verbal cues given to perform correctly       PT Long Term Goals - 06/08/16 1539      PT LONG TERM GOAL #1   Title Pt will score a 14% on the MODI by 07/20/16 to demonstrate significant improvement in  low back function and improved ability to transfer from supine to sitting without pain.   Baseline MODI: 24% dysfunction   Status New     PT LONG TERM GOAL #2   Title Patient will be independent with HEP focused on improving muscular endurance, coordination, and strength by 07/20/16 to continue benefits of therapy after discharge.   Baseline dependent for exercise performance and progression.    Status New     PT LONG TERM GOAL #3   Title Patient will be able to transfer from sitting to standing without pain/difficulty by 07/20/16 to demonstrate functional improvement in lumbar function.    Baseline Increased pain and difficulty when performing sitting to standing.    Status New               Plan - 06/20/16 1610    Clinical Impression Statement Improved transfers with less hip pain reported following exercises indicating improved strength and stabilization around trunk/hips. Patient required verbal cues and demonstration to perform all exercises with correct technique and will therefore benefit from continued physical therapy intervention to achieve goals.    Rehab Potential Good   PT Frequency 2x / week   PT Duration 6 weeks   PT Treatment/Interventions Iontophoresis /ml Dexamethasone;Therapeutic activities;Therapeutic exercise;Moist Heat;Manual techniques;Patient/family education;Passive range of motion;Cryotherapy;Neuromuscular re-education;Ultrasound   PT Next Visit Plan hip/core stabilization, manual therapy techniques   PT Home Exercise Plan ball squeeze/glute squeeze, seated clamshells, sidelying clamsheells      Patient will benefit from skilled therapeutic intervention in order to improve the following deficits and impairments:  Pain, Decreased strength, Decreased mobility, Difficulty walking, Decreased range of motion, Increased fascial restricitons, Decreased endurance, Increased muscle spasms, Hypomobility  Visit Diagnosis: Right-sided low back pain without sciatica  Pain in right hip  Muscle weakness (generalized)     Problem List Patient Active Problem List   Diagnosis Date Noted  . Low back pain 05/17/2016  . Right hip pain 05/17/2016  . Chest tightness 03/01/2016  . Rash 01/31/2015  . Health care maintenance 01/31/2015  . Left knee pain 10/12/2013  . GERD (gastroesophageal reflux disease) 12/29/2012  . Abnormal Pap smear of cervix 12/29/2012  . Essential hypertension, benign 12/25/2012  . Anemia 12/25/2012  . Degeneration of lumbar or lumbosacral intervertebral disc 12/25/2012  . Hypercholesterolemia 12/25/2012  . Vitamin D  deficiency 12/25/2012  . Multiple sclerosis (HCC) 12/25/2012    [name] PT 06/20/2016, 9:49 PM  [facility] PHYSICAL AND SPORTS MEDICINE [address] [place], [address] Phone: [phone]   Fax:  [phone]  Name: LOUVENIA GOLOMB MRN: [id number] Date of Birth: 06/15/1956

## 2016-06-27 ENCOUNTER — Encounter: Payer: Self-pay | Admitting: Physical Therapy

## 2016-06-27 ENCOUNTER — Ambulatory Visit: Payer: BC Managed Care – PPO | Admitting: Physical Therapy

## 2016-06-27 DIAGNOSIS — M545 Low back pain, unspecified: Secondary | ICD-10-CM

## 2016-06-27 DIAGNOSIS — M6281 Muscle weakness (generalized): Secondary | ICD-10-CM

## 2016-06-27 DIAGNOSIS — M25551 Pain in right hip: Secondary | ICD-10-CM

## 2016-06-27 NOTE — Therapy (Signed)
[facility] PH[facility]ORTS MEDICINE [address], [place], [address] Phone: [phone]   Fax:  [phone]  Physical Therapy Treatment  Patient Details  Name: Jackie White MRN: [id number] Date of Birth: 04-11-1956 Referring Provider: Dale Lackawanna MD  Encounter Date: 06/27/2016      PT End of Session - 06/27/16 0932    Visit Number 4   Number of Visits 12   Date for PT Re-Evaluation 07/20/16   PT Start Time 0847   PT Stop Time 0927   PT Time Calculation (min) 40 min   Activity Tolerance Patient tolerated treatment well   Behavior During Therapy [facility] for tasks assessed/performed      Past Medical History:  Diagnosis Date  . Abnormal Pap smear   . Cervical disc disease    Cervical and lumbar disc disease  . GERD (gastroesophageal reflux disease)   . History of iron deficiency   . Hypertension   . Multiple sclerosis (HCC)   . Personal history of colonic polyps   . Reactive airway disease   . Vitamin D deficiency     Past Surgical History:  Procedure Laterality Date  . BREAST REDUCTION SURGERY    . REDUCTION MAMMAPLASTY Bilateral 2002  . TOTAL ABDOMINAL HYSTERECTOMY  2003   Right Oophrectomy    There were no vitals filed for this visit.      Subjective Assessment - 06/27/16 0850    Subjective Patient reports she is noticing improvement slowly and noticing a difference with sit to stand with less stiffness and pain in hips. She feels the exercises are helping greatly and is feeling stronger.    Limitations Sitting;Standing;House hold activities;Walking   Patient Stated Goals Return to gym exercises, decrease pain when transferring   Currently in Pain? Yes   Pain Score 3    Pain Location Back  feels like a 8/10 first thing in the morning   Pain Orientation Right   Pain Descriptors / Indicators Aching;Tightness   Pain Type Chronic pain   Pain Onset More than a month ago   Pain Frequency Constant       Objective Observation; Gait: decreased trunk rotation, decreased push off both LE's, slow, guarded cadence Strength: hip abduction, ER and extension right LE decreased (4-/5) as compared to left hip (4/5)  Treatment: Therapeutic exercise: patient performed exercises with guidance, verbal and tactile cues and demonstration of PT: Side lying clamshells with end range hold x 5 seconds with mild resistance given by therapist x 5 reps, then active x 10 reps each LE Reverse clamshells x 10 reps each LE  Prone lying: Quad setting with glute sets x 10 each LE Partial hip extension with isometric holding x 10 reps   Standing:  Resistive band walk x 10 reps forward and backwards with verbal cuing to correct technique Hip rotary machine: standing hip abduction 2 x 8-10 reps right and Left LE 25# Hip extension 2 x 10 reps 40# each LE Standing walk on balance stones with using UE support for balance x 10 reps  Diagonal wedding march holding 2# weights x 2 min.  Patient response to treatment: improved motor control and activation with repetition for clamshells and hip extension. Fatigue noted with hip standing exercises at hip machine. Mild increased pain noted with exercises , no worse following treatment        PT Education - 06/27/16 0921    Education provided Yes   Education Details HEP: re assessed  resistive walking, added reverse clamshells; instructed in rotary hip machine for standing hip extension and abduction   Person(s) Educated Patient   Methods Explanation;Demonstration;Verbal cues   Comprehension Verbalized understanding;Returned demonstration;Verbal cues required             PT Long Term Goals - 06/08/16 1539      PT LONG TERM GOAL #1   Title Pt will score a 14% on the MODI by 07/20/16 to demonstrate significant improvement in low back function and improved ability to transfer from supine to sitting without pain.   Baseline MODI: 24% dysfunction   Status New      PT LONG TERM GOAL #2   Title Patient will be independent with HEP focused on improving muscular endurance, coordination, and strength by 07/20/16 to continue benefits of therapy after discharge.   Baseline dependent for exercise performance and progression.    Status New     PT LONG TERM GOAL #3   Title Patient will be able to transfer from sitting to standing without pain/difficulty by 07/20/16 to demonstrate functional improvement in lumbar function.   Baseline Increased pain and difficulty when performing sitting to standing.    Status New               Plan - 06/27/16 1610    Clinical Impression Statement Patient demonstrates good progress towards goals with improving strength, endurance and ability to perform exercises and transfers with less difficulty which demonstrates good carry over between visits. She continues to require guidance and cuing to perform exercises with correct technique and intensity.   Rehab Potential Good   PT Frequency 2x / week   PT Duration 6 weeks   PT Treatment/Interventions Iontophoresis /ml Dexamethasone;Therapeutic activities;Therapeutic exercise;Moist Heat;Manual techniques;Patient/family education;Passive range of motion;Cryotherapy;Neuromuscular re-education;Ultrasound   PT Next Visit Plan hip/core stabilization, manual therapy techniques   PT Home Exercise Plan ball squeeze/glute squeeze, seated clamshells, sidelying clamsheells, resisted walking, deagonal wedding march with weights      Patient will benefit from skilled therapeutic intervention in order to improve the following deficits and impairments:  Pain, Decreased strength, Decreased mobility, Difficulty walking, Decreased range of motion, Increased fascial restricitons, Decreased endurance, Increased muscle spasms, Hypomobility  Visit Diagnosis: Right-sided low back pain without sciatica  Pain in right hip  Muscle weakness (generalized)     Problem List Patient Active Problem  List   Diagnosis Date Noted  . Low back pain 05/17/2016  . Right hip pain 05/17/2016  . Chest tightness 03/01/2016  . Rash 01/31/2015  . Health care maintenance 01/31/2015  . Left knee pain 10/12/2013  . GERD (gastroesophageal reflux disease) 12/29/2012  . Abnormal Pap smear of cervix 12/29/2012  . Essential hypertension, benign 12/25/2012  . Anemia 12/25/2012  . Degeneration of lumbar or lumbosacral intervertebral disc 12/25/2012  . Hypercholesterolemia 12/25/2012  . Vitamin D deficiency 12/25/2012  . Multiple sclerosis (HCC) 12/25/2012    [name] PT 06/27/2016, 4:14 PM  [facility] PHYSICAL AND SPORTS MEDICINE [address], [place], [address] Phone: [phone]   Fax:  [phone]  Name: Jackie White MRN: [id number] Date of Birth: [birth year]

## 2016-07-04 ENCOUNTER — Encounter: Payer: Self-pay | Admitting: Physical Therapy

## 2016-07-04 ENCOUNTER — Ambulatory Visit: Payer: BC Managed Care – PPO | Admitting: Physical Therapy

## 2016-07-04 DIAGNOSIS — M545 Low back pain, unspecified: Secondary | ICD-10-CM

## 2016-07-04 DIAGNOSIS — M25551 Pain in right hip: Secondary | ICD-10-CM

## 2016-07-04 DIAGNOSIS — M6281 Muscle weakness (generalized): Secondary | ICD-10-CM

## 2016-07-04 DIAGNOSIS — M25651 Stiffness of right hip, not elsewhere classified: Secondary | ICD-10-CM

## 2016-07-04 NOTE — Therapy (Signed)
[facility] [address] [place], [place], [address] Phone: [phone]   Fax:  [phone]  Physical Therapy Treatment  Patient Details  Name: Jackie White MRN: [id number] Date of Birth: [birth year] Referring Provider: Dale Durhamharlene Scott MD  Encounter Date: 07/04/2016      PT End of Session - 07/04/16 1902    Visit Number 5   Number of Visits 12   Date for PT Re-Evaluation 07/20/16   PT Start Time 1855   PT Stop Time 1940   PT Time Calculation (min) 45 min   Activity Tolerance Patient tolerated treatment well   Behavior [facility] Therapy [facility] for tasks assessed/performed      Past Medical History:  Diagnosis Date  . Abnormal Pap smear   . Cervical disc disease    Cervical and lumbar disc disease  . GERD (gastroesophageal reflux disease)   . History of iron deficiency   . Hypertension   . Multiple sclerosis (HCC)   . Personal history of colonic polyps   . Reactive airway disease   . Vitamin D deficiency     Past Surgical History:  Procedure Laterality Date  . BREAST REDUCTION SURGERY    . REDUCTION MAMMAPLASTY Bilateral 2002  . TOTAL ABDOMINAL HYSTERECTOMY  2003   Right Oophrectomy    There were no vitals filed for this visit.      Subjective Assessment - 07/04/16 1858    Subjective Patient reports she still has the weakness in her back and right hip. She is improving as noted with walking with less pain in right hip and lower back. She still gets stiff in knees/hips/back following prolonged sitting and first thing in the morning.    Limitations Sitting;Standing;House hold activities;Walking   Patient Stated Goals Return to gym exercises, decrease pain when transferring   Currently in Pain? Yes   Pain Score 3    Pain Location Back  and right hip, still worse in the morning 8/10   Pain Orientation Right   Pain Descriptors / Indicators Aching;Tightness   Pain Type Chronic pain   Pain Onset More  than a month ago   Pain Frequency Constant      Objective Observation; Gait: improved trunk rotation, still decreased however less than previous session, increased cadence and more erect posture   Treatment: Therapeutic exercise: patient performed exercises with guidance, verbal and tactile cues and demonstration of PT: Side lying clamshells active x 10 reps each LE Reverse clamshells x 10 reps each LE  Standing:  Hip rotary machine (height #5, arm for thigh #4): standing hip abduction 1 x 10 reps right and Left LE 40# Hip extension 1 x 10 reps 55# each LE Standing dynamic balance: single leg stand with opposite LE on BOSU ball: toss ball x 15 reps with core control Standing walk on balance stones with using UE support for balance x 10 reps forward and back Diagonal wedding march holding 3# weights x 2 min. Seated leg press double leg 25#  2 x 25 reps with guidance for correct hip/knee alignment and assistance required to place right LE onto platform  Patient response to treatment: Patient demonstrated good technique with standing balance exercises with verbal cues and following demonstration. Mild fatigue noted at end of session. Improved technique with hip machine and able to tolerate increased intensity as compared to previous session. Reported some increased discomfort in left knee (had previous scope ~3 years ago)  PT Education - 07/04/16 1902    Education provided Yes   Education Details HEP: re assessed exercises for home   Person(s) Educated Patient   Methods Explanation;Demonstration;Verbal cues   Comprehension Verbalized understanding;Returned demonstration;Verbal cues required             PT Long Term Goals - 06/08/16 1539      PT LONG TERM GOAL #1   Title Pt will score a 14% on the MODI by 07/20/16 to demonstrate significant improvement in low back function and improved ability to transfer from supine to sitting without pain.   Baseline MODI: 24%  dysfunction   Status New     PT LONG TERM GOAL #2   Title Patient will be independent with HEP focused on improving muscular endurance, coordination, and strength by 07/20/16 to continue benefits of therapy after discharge.   Baseline dependent for exercise performance and progression.    Status New     PT LONG TERM GOAL #3   Title Patient will be able to transfer from sitting to standing without pain/difficulty by 07/20/16 to demonstrate functional improvement in lumbar function.   Baseline Increased pain and difficulty when performing sitting to standing.    Status New               Plan - 07/04/16 1953    Clinical Impression Statement Patient demonstrates good progress towards goals with increased endurance for walking in community, improved ability to perform exercises with less effort and difficulty. she continues with right hip, LE weakness which limits full function with walking and will therefore benefit from additional physical therapy intervention.    Rehab Potential Good   PT Frequency 2x / week   PT Duration 6 weeks   PT Treatment/Interventions Iontophoresis 4mg /ml Dexamethasone;Therapeutic activities;Therapeutic exercise;Moist Heat;Manual techniques;Patient/family education;Passive range of motion;Cryotherapy;Neuromuscular re-education;Ultrasound   PT Next Visit Plan hip/core stabilization, manual therapy techniques   PT Home Exercise Plan ball squeeze/glute squeeze, seated clamshells, sidelying clamsheells, resisted walking, deagonal wedding march with weights      Patient will benefit from skilled therapeutic intervention in order to improve the following deficits and impairments:  Pain, Decreased strength, Decreased mobility, Difficulty walking, Decreased range of motion, Increased fascial restricitons, Decreased endurance, Increased muscle spasms, Hypomobility  Visit Diagnosis: Right-sided low back pain without sciatica  Pain in right hip  Muscle weakness  (generalized)  Stiffness of right hip, not elsewhere classified     Problem List Patient Active Problem List   Diagnosis Date Noted  . Low back pain 05/17/2016  . Right hip pain 05/17/2016  . Chest tightness 03/01/2016  . Rash 01/31/2015  . Health care maintenance 01/31/2015  . Left knee pain 10/12/2013  . GERD (gastroesophageal reflux disease) 12/29/2012  . Abnormal Pap smear of cervix 12/29/2012  . Essential hypertension, benign 12/25/2012  . Anemia 12/25/2012  . Degeneration of lumbar or lumbosacral intervertebral disc 12/25/2012  . Hypercholesterolemia 12/25/2012  . Vitamin D deficiency 12/25/2012  . Multiple sclerosis (HCC) 12/25/2012    [name] PT 07/04/2016, 7:57 PM  [facility] PHYSICAL AND SPORTS MEDICINE [address], [place], [address] Phone: [phone]   Fax:  [phone]  Name: Jackie White MRN: [id number] Date of Birth: [birth year]

## 2016-07-06 ENCOUNTER — Ambulatory Visit: Payer: BC Managed Care – PPO | Admitting: Physical Therapy

## 2016-07-06 DIAGNOSIS — M6281 Muscle weakness (generalized): Secondary | ICD-10-CM

## 2016-07-07 NOTE — Therapy (Signed)
[facility] [address] [place], [place], [address] Phone: [phone]   Fax:  [phone]  Patient Details  Name: Jackie White MRN: [id number] Date of Birth: [birth year] Referring Provider:  Dale DurhamScott, Charlene, MD  Encounter Date: 07/06/2016   Patient performed limited independent exercise today due to appointment conflict with scheduling multiple patients at same time.  Patient arrived in clinic and agreed to perform exercises independently with distant supervision of PT and other supervision of PT tech. No therapy charges rendered.  Patient able to perform all exercises without reports of pain.  Plan: continue with PT treatment next session with progressive strengthening and guidance.   Beacher MayBrooks, [name] PT 07/07/2016, 9:46 PM  [facility] [address] [place], [place], [address] Phone: [phone]   Fax:  [phone]

## 2016-07-10 ENCOUNTER — Ambulatory Visit: Payer: BC Managed Care – PPO | Admitting: Physical Therapy

## 2016-07-10 ENCOUNTER — Encounter: Payer: Self-pay | Admitting: Physical Therapy

## 2016-07-10 DIAGNOSIS — M25551 Pain in right hip: Secondary | ICD-10-CM

## 2016-07-10 DIAGNOSIS — M25651 Stiffness of right hip, not elsewhere classified: Secondary | ICD-10-CM

## 2016-07-10 DIAGNOSIS — M6281 Muscle weakness (generalized): Secondary | ICD-10-CM

## 2016-07-10 DIAGNOSIS — M545 Low back pain, unspecified: Secondary | ICD-10-CM

## 2016-07-10 NOTE — Therapy (Signed)
[facility]TS MEDICINE [address] [place], [place], [address] Phone: [phone]   Fax:  [phone]  Physical Therapy Treatment  Patient Details  Name: Jackie White MRN: [id number] Date of Birth: 1955-12-30 Referring Provider: Dale Durhamharlene Scott MD  Encounter Date: 07/10/2016      PT End of Session - 07/10/16 1938    Visit Number 6   Number of Visits 12   Date for PT Re-Evaluation 07/20/16   PT Start Time 1907   PT Stop Time 1950   PT Time Calculation (min) 43 min   Activity Tolerance Patient tolerated treatment well   Behavior During Therapy [facility] for tasks assessed/performed      Past Medical History:  Diagnosis Date  . Abnormal Pap smear   . Cervical disc disease    Cervical and lumbar disc disease  . GERD (gastroesophageal reflux disease)   . History of iron deficiency   . Hypertension   . Multiple sclerosis (HCC)   . Personal history of colonic polyps   . Reactive airway disease   . Vitamin D deficiency     Past Surgical History:  Procedure Laterality Date  . BREAST REDUCTION SURGERY    . REDUCTION MAMMAPLASTY Bilateral 2002  . TOTAL ABDOMINAL HYSTERECTOMY  2003   Right Oophrectomy    There were no vitals filed for this visit.      Subjective Assessment - 07/10/16 1857    Subjective Patient reports she still has the weakness in her back and right hip. She is improving as noted with walking with less pain in right hip and lower back.    Limitations Sitting;Standing;House hold activities;Walking   Patient Stated Goals Return to gym exercises, decrease pain when transferring   Currently in Pain? No/denies      Objective Observation; Gait: improved posture, less forward trunk flexion improved trunk rotation   Treatment: Therapeutic exercise: patient performed exercises with guidance, verbal and tactile cues and demonstration of PT: Side lying clamshells active x 10 reps each LE with manual  resistance given for 5 repetitions Reverse clamshells x 10 each LE  Supine lying:  Bridging partial ROM x 10 Hip/knee flexion (marching) with TrA contraction x 10  Standing:  Hip rotary machine (height #5, arm for thigh #4): standing Hip extension 1 x 10 reps 55# each LE Standing walk on balance stones with using UE support for balance x 10 reps forward and back Diagonal wedding march holding 3# weights x 2 min. Side stepping on balance beam x 2 min. Seated leg press double leg 35#  1 x 25 reps, 45# 1 x 25 with guidance for correct hip/knee alignment and assistance required to place right LE onto platform NuStep x 10 min. Level #3 (independent; unbilled)  Patient response to treatment: Patient required minimal cuing for good technique and positioning for leg press/hip machine to perform with good alignment. Mild fatigue/soreness reported at end of session.         PT Long Term Goals - 06/08/16 1539      PT LONG TERM GOAL #1   Title Pt will score a 14% on the MODI by 07/20/16 to demonstrate significant improvement in low back function and improved ability to transfer from supine to sitting without pain.   Baseline MODI: 24% dysfunction   Status New     PT LONG TERM GOAL #2   Title Patient will be independent with HEP focused on improving muscular endurance, coordination, and strength by  07/20/16 to continue benefits of therapy after discharge.   Baseline dependent for exercise performance and progression.    Status New     PT LONG TERM GOAL #3   Title Patient will be able to transfer from sitting to standing without pain/difficulty by 07/20/16 to demonstrate functional improvement in lumbar function.   Baseline Increased pain and difficulty when performing sitting to standing.    Status New               Plan - 07/10/16 2000    Clinical Impression Statement Patient is progressing well towards goals for decreasing pain and improved abiltiy to walk. she continues with  weakness in right LE and requires guidance and cuing to perform exercises and progress strengthening.   Rehab Potential Good   PT Frequency 2x / week   PT Duration 6 weeks   PT Treatment/Interventions Iontophoresis 4mg /ml Dexamethasone;Therapeutic activities;Therapeutic exercise;Moist Heat;Manual techniques;Patient/family education;Passive range of motion;Cryotherapy;Neuromuscular re-education;Ultrasound   PT Next Visit Plan hip/core stabilization, manual therapy techniques   PT Home Exercise Plan ball squeeze/glute squeeze, seated clamshells, sidelying clamsheells, resisted walking, deagonal wedding march with weights      Patient will benefit from skilled therapeutic intervention in order to improve the following deficits and impairments:  Pain, Decreased strength, Decreased mobility, Difficulty walking, Decreased range of motion, Increased fascial restricitons, Decreased endurance, Increased muscle spasms, Hypomobility  Visit Diagnosis: Muscle weakness (generalized)  Right-sided low back pain without sciatica  Pain in right hip  Stiffness of right hip, not elsewhere classified     Problem List Patient Active Problem List   Diagnosis Date Noted  . Low back pain 05/17/2016  . Right hip pain 05/17/2016  . Chest tightness 03/01/2016  . Rash 01/31/2015  . Health care maintenance 01/31/2015  . Left knee pain 10/12/2013  . GERD (gastroesophageal reflux disease) 12/29/2012  . Abnormal Pap smear of cervix 12/29/2012  . Essential hypertension, benign 12/25/2012  . Anemia 12/25/2012  . Degeneration of lumbar or lumbosacral intervertebral disc 12/25/2012  . Hypercholesterolemia 12/25/2012  . Vitamin D deficiency 12/25/2012  . Multiple sclerosis (HCC) 12/25/2012    [name] PT 07/11/2016, 9:37 PM  [facility] PHYSICAL AND SPORTS MEDICINE [address] [place], [address] Phone: [phone]   Fax:  [phone]  Name: Jackie White MRN: [id number] Date of Birth: 1956/07/11

## 2016-07-12 ENCOUNTER — Ambulatory Visit: Payer: BC Managed Care – PPO | Admitting: Physical Therapy

## 2016-07-12 ENCOUNTER — Encounter: Payer: Self-pay | Admitting: Physical Therapy

## 2016-07-12 DIAGNOSIS — M25651 Stiffness of right hip, not elsewhere classified: Secondary | ICD-10-CM

## 2016-07-12 DIAGNOSIS — M6281 Muscle weakness (generalized): Secondary | ICD-10-CM

## 2016-07-12 DIAGNOSIS — M545 Low back pain, unspecified: Secondary | ICD-10-CM

## 2016-07-13 NOTE — Therapy (Signed)
[facility] [place] [facility] [address] [place], [address] Phone: [phone]   Fax:  [phone]  Physical Therapy Treatment  Patient Details  Name: Jackie White MRN: [id number] Date of Birth: Mar 21, 1956 Referring Provider: Dale Hodges MD  Encounter Date: 07/12/2016      PT End of Session - 07/12/16 2000    Visit Number 7   Number of Visits 12   Date for PT Re-Evaluation 07/20/16   PT Start Time 1905   PT Stop Time 1945   PT Time Calculation (min) 40 min   Activity Tolerance Patient tolerated treatment well;Patient limited by fatigue   Behavior During Therapy [facility] for tasks assessed/performed      Past Medical History:  Diagnosis Date  . Abnormal Pap smear   . Cervical disc disease    Cervical and lumbar disc disease  . GERD (gastroesophageal reflux disease)   . History of iron deficiency   . Hypertension   . Multiple sclerosis (HCC)   . Personal history of colonic polyps   . Reactive airway disease   . Vitamin D deficiency     Past Surgical History:  Procedure Laterality Date  . BREAST REDUCTION SURGERY    . REDUCTION MAMMAPLASTY Bilateral 2002  . TOTAL ABDOMINAL HYSTERECTOMY  2003   Right Oophrectomy    There were no vitals filed for this visit.      Subjective Assessment - 07/12/16 2000    Subjective Patient reports being tired following previous session, no worse and is fatigued today.   Limitations Sitting;Standing;House hold activities;Walking   Patient Stated Goals Return to gym exercises, decrease pain when transferring   Currently in Pain? No/denies      Objective Observation; Gait:slow cadence with decreased trunk rotation and increased lateral trunk flexion   Treatment: Therapeutic exercise: patient performed exercises with guidance, verbal and tactile cues and demonstration of PT: Side lying clamshells active x 10 reps each LE with manual resistance given for 5  repetitions Hip/knee flexion with assistance of therapist x 10 reps  Supine lying:  Assisted stretching: hip flexion, hamstring stretch, hip ER, piriformis and quadriceps, hip flexor stretch over treatment table edge all 3 x 30 seconds   Patient response to treatment: Patient fatigue limits exercises today. She responded well to stretching, continues with stiffness, better following Nustep         PT Education - 07/12/16 2000    Education provided Yes   Education Details HEP modification when fatigued: stretching, less exercises and more ROM   Person(s) Educated Patient   Methods Explanation;Demonstration;Verbal cues   Comprehension Verbalized understanding;Returned demonstration;Verbal cues required             PT Long Term Goals - 06/08/16 1539      PT LONG TERM GOAL #1   Title Pt will score a 14% on the MODI by 07/20/16 to demonstrate significant improvement in low back function and improved ability to transfer from supine to sitting without pain.   Baseline MODI: 24% dysfunction   Status New     PT LONG TERM GOAL #2   Title Patient will be independent with HEP focused on improving muscular endurance, coordination, and strength by 07/20/16 to continue benefits of therapy after discharge.   Baseline dependent for exercise performance and progression.    Status New     PT LONG TERM GOAL #3   Title Patient will be able to transfer from sitting to standing without pain/difficulty by  07/20/16 to demonstrate functional improvement in lumbar function.   Baseline Increased pain and difficulty when performing sitting to standing.    Status New               Plan - 07/12/16 2000    Clinical Impression Statement Patient fatigue limited session today. Modified exercises and performed stretching to alleviate stiffness in right hip.   Rehab Potential Good   PT Frequency 2x / week   PT Duration 6 weeks   PT Treatment/Interventions Iontophoresis 4mg /ml  Dexamethasone;Therapeutic activities;Therapeutic exercise;Moist Heat;Manual techniques;Patient/family education;Passive range of motion;Cryotherapy;Neuromuscular re-education;Ultrasound   PT Next Visit Plan hip/core stabilization, manual therapy techniques   PT Home Exercise Plan ball squeeze/glute squeeze, seated clamshells, sidelying clamsheells, resisted walking, deagonal wedding march with weights      Patient will benefit from skilled therapeutic intervention in order to improve the following deficits and impairments:  Pain, Decreased strength, Decreased mobility, Difficulty walking, Decreased range of motion, Increased fascial restricitons, Decreased endurance, Increased muscle spasms, Hypomobility  Visit Diagnosis: Muscle weakness (generalized)  Right-sided low back pain without sciatica  Stiffness of right hip, not elsewhere classified     Problem List Patient Active Problem List   Diagnosis Date Noted  . Low back pain 05/17/2016  . Right hip pain 05/17/2016  . Chest tightness 03/01/2016  . Rash 01/31/2015  . Health care maintenance 01/31/2015  . Left knee pain 10/12/2013  . GERD (gastroesophageal reflux disease) 12/29/2012  . Abnormal Pap smear of cervix 12/29/2012  . Essential hypertension, benign 12/25/2012  . Anemia 12/25/2012  . Degeneration of lumbar or lumbosacral intervertebral disc 12/25/2012  . Hypercholesterolemia 12/25/2012  . Vitamin D deficiency 12/25/2012  . Multiple sclerosis (HCC) 12/25/2012    Beacher MayBrooks, Micaiah Litle PT 07/13/2016, 8:49 PM  [facility] [address] [place], [place], [address] Phone: [phone]   Fax:  [phone]  Name: Vaughan BastaMarida J Cosma MRN: [id number] Date of Birth: [birth year]

## 2016-07-17 ENCOUNTER — Encounter: Payer: Self-pay | Admitting: Physical Therapy

## 2016-07-17 ENCOUNTER — Ambulatory Visit: Payer: BC Managed Care – PPO | Admitting: Physical Therapy

## 2016-07-17 DIAGNOSIS — M545 Low back pain, unspecified: Secondary | ICD-10-CM

## 2016-07-17 DIAGNOSIS — M25651 Stiffness of right hip, not elsewhere classified: Secondary | ICD-10-CM

## 2016-07-17 DIAGNOSIS — M6281 Muscle weakness (generalized): Secondary | ICD-10-CM

## 2016-07-17 DIAGNOSIS — M25551 Pain in right hip: Secondary | ICD-10-CM

## 2016-07-17 NOTE — Therapy (Signed)
[facility] [address] [place], [place], [address] Phone: [phone]   Fax:  [phone]  Physical Therapy Treatment  Patient Details  Name: Jackie White MRN: [id number] Date of Birth: 28-Jun-1956 Referring Provider: Dale Durhamharlene Scott MD  Encounter Date: 07/17/2016      PT End of Session - 07/17/16 1935    Visit Number 8   Number of Visits 12   Date for PT Re-Evaluation 07/20/16   PT Start Time 1905   PT Stop Time 1949   PT Time Calculation (min) 44 min   Activity Tolerance Patient tolerated treatment well;Patient limited by fatigue   Behavior During Therapy [facility] for tasks assessed/performed      Past Medical History:  Diagnosis Date  . Abnormal Pap smear   . Cervical disc disease    Cervical and lumbar disc disease  . GERD (gastroesophageal reflux disease)   . History of iron deficiency   . Hypertension   . Multiple sclerosis (HCC)   . Personal history of colonic polyps   . Reactive airway disease   . Vitamin D deficiency     Past Surgical History:  Procedure Laterality Date  . BREAST REDUCTION SURGERY    . REDUCTION MAMMAPLASTY Bilateral 2002  . TOTAL ABDOMINAL HYSTERECTOMY  2003   Right Oophrectomy    There were no vitals filed for this visit.      Subjective Assessment - 07/17/16 1907    Subjective Better than previous session. She has more energy and less right hip pain/stiffness.   Limitations Sitting;Standing;House hold activities;Walking   Patient Stated Goals Return to gym exercises, decrease pain when transferring   Currently in Pain? No/denies      Objective Observation; Gait: improved posture, less forward trunk flexion improved trunk rotation than previous session Strength: decreased hip abduction bilaterally and decreased hip ER bilaterally   Treatment: Therapeutic exercise: patient performed exercises with guidance, verbal and tactile cues and demonstration of  PT:  Supine lying:  Bridging partial ROM x 10 Hip abduction with assistance of therapist x 10 reps 5 second holds each LE Hook lying: hip stretches for ER and IR/piriformis  X 3 reps each LE, LTR x 10 reps all with assistance of therapist    Standing:  Hip rotary machine (height #5, arm for thigh #2): standing Hip extension 2x 10 reps 55# each LE Diagonal wedding march holding 3# weights x 2 min. Standing ball toss with one leg on BOSU for balance toss 4# ball x 15 reps for each LE Seated leg press double leg 45# 1 x 15 reps, 35# 1 x 15 single leg each LE with guidance for correct hip/knee alignment and assistance required to place right LE onto platform NuStep x 11 min. Level #3 (independent; unbilled) (.5 mile)  Patient response to treatment: Patient limited by fatigue for exercises; required assistance to perform strengthening for LE's hip abduction and ER and for stretching to perform with correct alignment. Improved motor control for hip ER and abduction with repetition (supine lying exercises)          PT Education - 07/17/16 1934    Education provided Yes   Education Details HEP: red resistive band for hip abduction in supine lying and standing tap foot out to each side to encourage correct hip/knee alignment   Person(s) Educated Patient   Methods Explanation;Demonstration;Verbal cues   Comprehension Verbalized understanding;Returned demonstration;Verbal cues required  PT Long Term Goals - 06/08/16 1539      PT LONG TERM GOAL #1   Title Pt will score a 14% on the MODI by 07/20/16 to demonstrate significant improvement in low back function and improved ability to transfer from supine to sitting without pain.   Baseline MODI: 24% dysfunction   Status New     PT LONG TERM GOAL #2   Title Patient will be independent with HEP focused on improving muscular endurance, coordination, and strength by 07/20/16 to continue benefits of therapy after discharge.    Baseline dependent for exercise performance and progression.    Status New     PT LONG TERM GOAL #3   Title Patient will be able to transfer from sitting to standing without pain/difficulty by 07/20/16 to demonstrate functional improvement in lumbar function.   Baseline Increased pain and difficulty when performing sitting to standing.    Status New               Plan - 07/17/16 1936    Clinical Impression Statement Patient able to perform exercises with more endurance today than previous session and demonstrated improved technique with exercises in supine. She continues with significant weakness in both hips, right>left and will benefit from additional physical therapy intervention to address functional limitations.    Rehab Potential Good   PT Frequency 2x / week   PT Duration 6 weeks   PT Treatment/Interventions Iontophoresis 4mg /ml Dexamethasone;Therapeutic activities;Therapeutic exercise;Moist Heat;Manual techniques;Patient/family education;Passive range of motion;Cryotherapy;Neuromuscular re-education;Ultrasound   PT Next Visit Plan hip/core stabilization, manual therapy techniques   PT Home Exercise Plan ball squeeze/glute squeeze, seated clamshells, sidelying clamsheells, resisted walking, deagonal wedding march with weights      Patient will benefit from skilled therapeutic intervention in order to improve the following deficits and impairments:  Pain, Decreased strength, Decreased mobility, Difficulty walking, Decreased range of motion, Increased fascial restricitons, Decreased endurance, Increased muscle spasms, Hypomobility  Visit Diagnosis: Muscle weakness (generalized)  Right-sided low back pain without sciatica  Stiffness of right hip, not elsewhere classified  Pain in right hip     Problem List Patient Active Problem List   Diagnosis Date Noted  . Low back pain 05/17/2016  . Right hip pain 05/17/2016  . Chest tightness 03/01/2016  . Rash 01/31/2015  .  Health care maintenance 01/31/2015  . Left knee pain 10/12/2013  . GERD (gastroesophageal reflux disease) 12/29/2012  . Abnormal Pap smear of cervix 12/29/2012  . Essential hypertension, benign 12/25/2012  . Anemia 12/25/2012  . Degeneration of lumbar or lumbosacral intervertebral disc 12/25/2012  . Hypercholesterolemia 12/25/2012  . Vitamin D deficiency 12/25/2012  . Multiple sclerosis (HCC) 12/25/2012    [name] PT 07/18/2016, 4:04 PM  [facility] SPORTS MEDIC[facility] [address], [place], [address] Phone: [phone]   Fax:  [phone]  Name: Jackie White MRN: [id number] Date of Birth: 12-23-1955

## 2016-07-19 ENCOUNTER — Ambulatory Visit: Payer: BC Managed Care – PPO | Admitting: Physical Therapy

## 2016-07-27 ENCOUNTER — Ambulatory Visit: Payer: BC Managed Care – PPO | Attending: Internal Medicine | Admitting: Physical Therapy

## 2016-07-27 DIAGNOSIS — M545 Low back pain, unspecified: Secondary | ICD-10-CM

## 2016-07-27 DIAGNOSIS — M25551 Pain in right hip: Secondary | ICD-10-CM | POA: Diagnosis present

## 2016-07-27 DIAGNOSIS — M25651 Stiffness of right hip, not elsewhere classified: Secondary | ICD-10-CM | POA: Diagnosis present

## 2016-07-27 DIAGNOSIS — M6281 Muscle weakness (generalized): Secondary | ICD-10-CM | POA: Diagnosis not present

## 2016-07-27 NOTE — Therapy (Signed)
[place] [facility] [address], [place], [address] Phone: [phone]   Fax:  [phone]  Physical Therapy Treatment  Patient Details  Name: Jackie White MRN: [id number] Date of Birth: [birth year] Referring Provider: Dale Crowheart MD  Encounter Date: 07/27/2016      PT End of Session - 07/27/16 1854    Visit Number 9   Number of Visits 24   Date for PT Re-Evaluation 09/07/16   PT Start Time 1850   PT Stop Time 1935   PT Time Calculation (min) 45 min   Activity Tolerance Patient tolerated treatment well;Patient limited by fatigue   Behavior During Therapy [facility] for tasks assessed/performed      Past Medical History:  Diagnosis Date  . Abnormal Pap smear   . Cervical disc disease    Cervical and lumbar disc disease  . GERD (gastroesophageal reflux disease)   . History of iron deficiency   . Hypertension   . Multiple sclerosis (HCC)   . Personal history of colonic polyps   . Reactive airway disease   . Vitamin D deficiency     Past Surgical History:  Procedure Laterality Date  . BREAST REDUCTION SURGERY    . REDUCTION MAMMAPLASTY Bilateral 2002  . TOTAL ABDOMINAL HYSTERECTOMY  2003   Right Oophrectomy    There were no vitals filed for this visit.      Subjective Assessment - 07/27/16 1851    Subjective Patient reports that she is feeling stronger. She feels she still requires assistance and guidance with exercises in order to improve function with walking and daily activities.    Limitations Sitting;Standing;House hold activities;Walking   Patient Stated Goals Return to gym exercises, decrease pain when transferring   Currently in Pain? No/denies      Observation: Gait: more erect posture, walking with increased hip extension and push off  Strength:  hip abduction left 4-/5, right 3-/5; hip ER left 3+/5, right 3-/5 Outcome measure: MODI 24%   Treatment: Therapeutic exercise: patient  performed exercises with guidance, verbal and tactile cues and demonstration of PT:  Supine lying:  Hip abduction with assistance of therapist x 10 reps 5 second holds each LE (right able to tolerate decreased resistance as compared to left) Hook lying: hip stretches for hamstring, ER and IR/piriformis  X 3 reps each LE, LTR x 10 reps all with assistance of therapist    Standing:  Hip rotary machine (height #5, arm for thigh #2): standing Hip extension 2x 15 reps 55# each LE Diagonal wedding march holding 2# weights x 2 min. Standing ball toss with one leg on BOSU for balance toss 4# ball x 15 reps for each LE Seated leg press double leg 45# 1x 15 reps, 35# 1 x 15   NuStep x 14 min. Level #3 (independent; unbilled) (. )  Patient response to treatment:Patient required assistance to complete all exercises with good alignment of hip/knees and trunk.           PT Education - 07/27/16 2000    Education provided Yes   Education Details HEP: continue with exercises for flexibility, strengthening hips, LE's   Person(s) Educated Patient   Methods Explanation;Verbal cues   Comprehension Verbalized understanding;Returned demonstration;Verbal cues required             PT Long Term Goals - 07/27/16 2000      PT LONG TERM GOAL #1   Title Pt will score a 14% on the  MODI by 09/07/16 to demonstrate significant improvement in low back function and improved ability to transfer from supine to sitting without pain.   Baseline MODI: 24% dysfunction   Status Revised     PT LONG TERM GOAL #2   Title Patient will be independent with HEP focused on improving muscular endurance, coordination, and strength by 09/07/16 to continue benefits of therapy after discharge.   Baseline dependent for exercise performance and progression. 07/27/2016: moderate cuing required to perform exercise     PT LONG TERM GOAL #3   Title Patient will be able to transfer from sitting to standing without  pain/difficulty by 09/07/16 to demonstrate functional improvement in lumbar function.   Baseline Increased pain and difficulty when performing sitting to standing. 07/27/16: intermittently able to perform sit to stand without pain/stiffness   Status Partially Met               Plan - 07/27/16 2000    Clinical Impression Statement Patient is progressing with improved flexibility and strength in hips/LE's with current treatment. She continues with significant weakness in right hip musculature and stiffness in hips which limits function with prolonged sitting and walking activities. she will require additional physical therapy interventio to address limitations and achieve maximal function.    Rehab Potential Good   Clinical Impairments Affecting Rehab Potential (+) highly motivated, family support (-) chronicity of impairment, history of MS   PT Frequency 2x / week   PT Duration 6 weeks   PT Treatment/Interventions Iontophoresis '4mg'$ /ml Dexamethasone;Therapeutic activities;Therapeutic exercise;Moist Heat;Manual techniques;Patient/family education;Passive range of motion;Cryotherapy;Neuromuscular re-education;Ultrasound   PT Next Visit Plan hip/core stabilization, manual therapy techniques   PT Home Exercise Plan ball squeeze/glute squeeze, seated clamshells, sidelying clamsheells, resisted walking, deagonal wedding march with weights   Consulted and Agree with Plan of Care Patient      Patient will benefit from skilled therapeutic intervention in order to improve the following deficits and impairments:  Pain, Decreased strength, Decreased mobility, Difficulty walking, Decreased range of motion, Increased fascial restricitons, Decreased endurance, Increased muscle spasms, Hypomobility  Visit Diagnosis: Muscle weakness (generalized) - Plan: PT plan of care cert/re-cert  Right-sided low back pain without sciatica - Plan: PT plan of care cert/re-cert  Stiffness of right hip, not elsewhere  classified - Plan: PT plan of care cert/re-cert  Pain in right hip - Plan: PT plan of care cert/re-cert     Problem List Patient Active Problem List   Diagnosis Date Noted  . Low back pain 05/17/2016  . Right hip pain 05/17/2016  . Chest tightness 03/01/2016  . Rash 01/31/2015  . Health care maintenance 01/31/2015  . Left knee pain 10/12/2013  . GERD (gastroesophageal reflux disease) 12/29/2012  . Abnormal Pap smear of cervix 12/29/2012  . Essential hypertension, benign 12/25/2012  . Anemia 12/25/2012  . Degeneration of lumbar or lumbosacral intervertebral disc 12/25/2012  . Hypercholesterolemia 12/25/2012  . Vitamin D deficiency 12/25/2012  . Multiple sclerosis ([place]) 12/25/2012    Jomarie Longs PT 07/28/2016, 9:58 PM  [place] [facility] [address] [place], [address] Phone: [phone]   Fax:  [phone]  Name: Jackie White MRN: [id number] Date of Birth: [birth year]

## 2016-08-02 ENCOUNTER — Encounter: Payer: Self-pay | Admitting: Physical Therapy

## 2016-08-02 ENCOUNTER — Ambulatory Visit (INDEPENDENT_AMBULATORY_CARE_PROVIDER_SITE_OTHER): Payer: BC Managed Care – PPO | Admitting: Internal Medicine

## 2016-08-02 ENCOUNTER — Encounter: Payer: Self-pay | Admitting: Internal Medicine

## 2016-08-02 ENCOUNTER — Ambulatory Visit: Payer: BC Managed Care – PPO | Admitting: Physical Therapy

## 2016-08-02 VITALS — BP 118/80 | HR 86 | Temp 97.6°F | Ht 68.0 in | Wt 230.6 lb

## 2016-08-02 DIAGNOSIS — R87619 Unspecified abnormal cytological findings in specimens from cervix uteri: Secondary | ICD-10-CM

## 2016-08-02 DIAGNOSIS — Z0000 Encounter for general adult medical examination without abnormal findings: Secondary | ICD-10-CM

## 2016-08-02 DIAGNOSIS — M6281 Muscle weakness (generalized): Secondary | ICD-10-CM | POA: Diagnosis not present

## 2016-08-02 DIAGNOSIS — E559 Vitamin D deficiency, unspecified: Secondary | ICD-10-CM | POA: Diagnosis not present

## 2016-08-02 DIAGNOSIS — I10 Essential (primary) hypertension: Secondary | ICD-10-CM

## 2016-08-02 DIAGNOSIS — M545 Low back pain, unspecified: Secondary | ICD-10-CM

## 2016-08-02 DIAGNOSIS — G35 Multiple sclerosis: Secondary | ICD-10-CM | POA: Diagnosis not present

## 2016-08-02 DIAGNOSIS — K649 Unspecified hemorrhoids: Secondary | ICD-10-CM | POA: Insufficient documentation

## 2016-08-02 DIAGNOSIS — E7800 Pure hypercholesterolemia, unspecified: Secondary | ICD-10-CM

## 2016-08-02 DIAGNOSIS — D649 Anemia, unspecified: Secondary | ICD-10-CM

## 2016-08-02 DIAGNOSIS — Z1239 Encounter for other screening for malignant neoplasm of breast: Secondary | ICD-10-CM | POA: Diagnosis not present

## 2016-08-02 DIAGNOSIS — K219 Gastro-esophageal reflux disease without esophagitis: Secondary | ICD-10-CM

## 2016-08-02 DIAGNOSIS — M25651 Stiffness of right hip, not elsewhere classified: Secondary | ICD-10-CM

## 2016-08-02 NOTE — Assessment & Plan Note (Signed)
Has a documented history of iron deficient anemia.  Recheck cbc.

## 2016-08-02 NOTE — Assessment & Plan Note (Signed)
Physical today 08/02/16.  PAP - states had last year at [facility].  Need results.   She reports colonoscopy in 2015.  Performed by Dr Bluford Kaufmann.  Need reults.

## 2016-08-02 NOTE — Assessment & Plan Note (Signed)
On nexium.  No upper symptoms reported.   

## 2016-08-02 NOTE — Assessment & Plan Note (Signed)
Blood pressure under good control.  Continue same medication regimen.  Follow pressures.  Follow metabolic panel.   

## 2016-08-02 NOTE — Assessment & Plan Note (Signed)
Minimal irritations and itching at times.  Only noticed on one occasion streak of blood with wiping.  No other blood.  annusol HC suppositories if needed.  No symptoms currently.

## 2016-08-02 NOTE — Assessment & Plan Note (Signed)
Low cholesterol diet and exercise.  Follow lipid panel.   

## 2016-08-02 NOTE — Assessment & Plan Note (Signed)
Followed at [facility].  PAP 06/15/15 - negative with negative HPV.

## 2016-08-02 NOTE — Progress Notes (Signed)
Pre visit review using our clinic review tool, if applicable. No additional management support is needed unless otherwise documented below in the visit note. 

## 2016-08-02 NOTE — Assessment & Plan Note (Signed)
Recheck vitamin D level 

## 2016-08-02 NOTE — Progress Notes (Signed)
Patient ID: Jackie White, female   DOB: Jun 19, 1956, 60 y.o.   MRN: [id number]   Subjective:    Patient ID: Jackie White, female    DOB: 11-Sep-1956, 60 y.o.   MRN: [id number]  HPI  Patient here for her physical exam.  She has MS.  Sees neurology.  Changing from tecfidera to ocrelizumab.  Just evaluated at [facility] - by Horton Chin, PA.  See note. Overall feels things are stable.  She has been going to therapy.  Last session today.  No chest pain.  No sob.  No acid reflux.  No abdominal pain or cramping.  Bowels stable.  Has been having issues with hemorrhoids.   Have been painful and itching.  No bleeding.  Had pap last year.  Bowels stable.     Past Medical History:  Diagnosis Date  . Abnormal Pap smear   . Cervical disc disease    Cervical and lumbar disc disease  . GERD (gastroesophageal reflux disease)   . History of iron deficiency   . Hypertension   . Multiple sclerosis (HCC)   . Personal history of colonic polyps   . Reactive airway disease   . Vitamin D deficiency    Past Surgical History:  Procedure Laterality Date  . BREAST REDUCTION SURGERY    . REDUCTION MAMMAPLASTY Bilateral 2002  . TOTAL ABDOMINAL HYSTERECTOMY  2003   Right Oophrectomy   Family History  Problem Relation Age of Onset  . Stroke Mother   . Hypertension Mother   . Heart disease Maternal Grandmother    Social History   Social History  . Marital status: Single    Spouse name: N/A  . Number of children: N/A  . Years of education: N/A   Social History Main Topics  . Smoking status: Never Smoker  . Smokeless tobacco: Never Used  . Alcohol use 0.0 oz/week  . Drug use: No  . Sexual activity: Not Asked   Other Topics Concern  . None   Social History Narrative  . None    Outpatient Encounter Prescriptions as of 08/02/2016  Medication Sig  . Dimethyl Fumarate (TECFIDERA) 240 MG CPDR Take by mouth 2 (two) times daily.  . metoprolol succinate (TOPROL-XL) 25 MG 24 hr tablet TAKE 1 TABLET BY  MOUTH EVERY DAY  . NEXIUM 24HR 20 MG capsule TAKE 1 CAPSULE BY MOUTH DAILY BEFORE BREAKFAST  . Ocrelizumab (OCREVUS IV) Inject into the vein.  . valsartan-hydrochlorothiazide (DIOVAN-HCT) 320-25 MG tablet TAKE 1 TABLET BY MOUTH EVERY DAY  . VITAMIN D, ERGOCALCIFEROL, PO Take by mouth.  . [DISCONTINUED] albuterol (PROVENTIL HFA;VENTOLIN HFA) 108 (90 Base) MCG/ACT inhaler Inhale 1-2 puffs into the lungs every 6 (six) hours as needed for wheezing or shortness of breath.  . [DISCONTINUED] Cyanocobalamin (RA VITAMIN B-12 TR) 1000 MCG TBCR Take by mouth.  . [DISCONTINUED] predniSONE (DELTASONE) 20 MG tablet Take 5 tablets x 2 more days and then three tablets x one day and then decrease by 1/2 tablet per day until down to zero mg.  (so will be -  100mg  x 2 days and the 60mg  x 1 day, 50mg  x 1 day, 40mg  x 1 day, 30mg  x 1 day, 20mg  x 1 day, 10mg  x 1 day and then stop).   No facility-administered encounter medications on file as of 08/02/2016.     Review of Systems  Constitutional: Negative for appetite change and unexpected weight change.  HENT: Negative for congestion and sinus pressure.   Eyes: Negative  for pain and visual disturbance.  Respiratory: Negative for cough, chest tightness and shortness of breath.   Cardiovascular: Negative for chest pain, palpitations and leg swelling.  Gastrointestinal: Negative for abdominal pain, diarrhea, nausea and vomiting.       Hemorrhoids as outlined.    Genitourinary: Negative for difficulty urinating and dysuria.  Musculoskeletal: Negative for back pain and joint swelling.  Skin: Negative for color change and rash.  Neurological: Negative for dizziness, light-headedness and headaches.  Hematological: Negative for adenopathy. Does not bruise/bleed easily.  Psychiatric/Behavioral: Negative for agitation and dysphoric mood.       Objective:    Physical Exam  Constitutional: She is oriented to person, place, and time. She appears well-developed and  well-nourished. No distress.  HENT:  Nose: Nose normal.  Mouth/Throat: Oropharynx is clear and moist.  Eyes: Right eye exhibits no discharge. Left eye exhibits no discharge. No scleral icterus.  Neck: Neck supple. No thyromegaly present.  Cardiovascular: Normal rate and regular rhythm.   Pulmonary/Chest: Breath sounds normal. No accessory muscle usage. No tachypnea. No respiratory distress. She has no decreased breath sounds. She has no wheezes. She has no rhonchi. Right breast exhibits no inverted nipple, no mass, no nipple discharge and no tenderness (no axillary adenopathy). Left breast exhibits no inverted nipple, no mass, no nipple discharge and no tenderness (no axilarry adenopathy).  Abdominal: Soft. Bowel sounds are normal. There is no tenderness.  Musculoskeletal: She exhibits no edema or tenderness.  Lymphadenopathy:    She has no cervical adenopathy.  Neurological: She is alert and oriented to person, place, and time.  Skin: Skin is warm. No rash noted. No erythema.  Psychiatric: She has a normal mood and affect. Her behavior is normal.    BP 118/80   Pulse 86   Temp 97.6 F (36.4 C) (Oral)   Ht 5\' 8"  (1.727 m)   Wt 230 lb 9.6 oz (104.6 kg)   LMP 12/27/2000   SpO2 98%   BMI 35.06 kg/m  Wt Readings from Last 3 Encounters:  08/02/16 230 lb 9.6 oz (104.6 kg)  05/17/16 233 lb 6 oz (105.9 kg)  03/01/16 226 lb (102.5 kg)     Lab Results  Component Value Date   WBC 4.2 03/01/2016   HGB 13.5 03/01/2016   HCT 41.0 03/01/2016   PLT 283 03/01/2016   GLUCOSE 90 03/01/2016   CHOL 178 01/26/2015   TRIG 69.0 01/26/2015   HDL 82.10 01/26/2015   LDLCALC 82 01/26/2015   ALT 17 07/30/2015   AST 17 07/30/2015   NA 138 03/01/2016   K 3.6 03/01/2016   CL 103 03/01/2016   CREATININE 0.66 03/01/2016   BUN 10 03/01/2016   CO2 31 03/01/2016   TSH 1.00 01/28/2016    Dg Chest 2 View  Result Date: 03/01/2016 CLINICAL DATA:  Chest tightness for 1 week.  Tingling in LEFT arm.  EXAM: CHEST  2 VIEW COMPARISON:  None. FINDINGS: The heart size and mediastinal contours are within normal limits. Both lungs are clear. The visualized skeletal structures are unremarkable. Mild scoliosis convex RIGHT. IMPRESSION: No active cardiopulmonary disease. Electronically Signed   By: Elsie Stain M.D.   On: 03/01/2016 13:59       Assessment & Plan:   Problem List Items Addressed This Visit    Abnormal Pap smear of cervix    Followed at [facility].  PAP 06/15/15 - negative with negative HPV.       Anemia    Has a  documented history of iron deficient anemia.  Recheck cbc.       Essential hypertension, benign    Blood pressure under good control.  Continue same medication regimen.  Follow pressures.  Follow metabolic panel.        Relevant Orders   Basic metabolic panel   GERD (gastroesophageal reflux disease)    On nexium.  No upper symptoms reported.        Health care maintenance    Physical today 08/02/16.  P[facility] - [facility].  Need results.   She reports colonoscopy in 2015.  Performed by Dr Bluford Kaufmannh.  Need reults.        Hemorrhoids    Minimal irritations and itching at times.  Only noticed on one occasion streak of blood with wiping.  No other blood.  annusol HC suppositories if needed.  No symptoms currently.        Hypercholesterolemia    Low cholesterol diet and exercise.  Follow lipid panel.        Relevant Orders   Lipid panel   Hepatic function panel   Multiple sclerosis (HCC)    Followed by neurology.  Just started ocrelizumab.  Due tomorrow for next treatment.  Overall feels doing ok.        Relevant Medications   Ocrelizumab (OCREVUS IV)   Vitamin D deficiency    Recheck vitamin D level.         Other Visit Diagnoses    Routine general medical examination at a health care facility    -  Primary   Screening breast examination       Relevant Orders   MM Digital Screening       Dale DurhamSCOTT, Tyreshia Ingman, MD

## 2016-08-02 NOTE — Assessment & Plan Note (Signed)
Followed by neurology.  Just started ocrelizumab.  Due tomorrow for next treatment.  Overall feels doing ok.

## 2016-08-03 NOTE — Therapy (Signed)
[facility] [address] [place], [address] Phone: [phone]   Fax:  [phone]  Physical Therapy Treatment  Patient Details  Name: Jackie White MRN: [id number] Date of Birth: 1956/11/01 Referring Provider: Einar Pheasant MD  Encounter Date: 08/02/2016      PT End of Session - 08/02/16 1906    Visit Number 10   Number of Visits 24   Date for PT Re-Evaluation 09/07/16   PT Start Time 02-24-99   PT Stop Time 1940   PT Time Calculation (min) 40 min   Activity Tolerance Patient tolerated treatment well;Patient limited by fatigue   Behavior During Therapy [facility] for tasks assessed/performed      Past Medical History:  Diagnosis Date  . Abnormal Pap smear   . Cervical disc disease    Cervical and lumbar disc disease  . GERD (gastroesophageal reflux disease)   . History of iron deficiency   . Hypertension   . Multiple sclerosis ([place])   . Personal history of colonic polyps   . Reactive airway disease   . Vitamin D deficiency     Past Surgical History:  Procedure Laterality Date  . BREAST REDUCTION SURGERY    . REDUCTION MAMMAPLASTY Bilateral 02-23-2001  . TOTAL ABDOMINAL HYSTERECTOMY  02/23/02   Right Oophrectomy    There were no vitals filed for this visit.      Subjective Assessment - 08/02/16 1900    Subjective Patient reports she is having a good day today. Overall she is feeling much better.    Limitations Sitting;Standing;House hold activities;Walking   Patient Stated Goals Return to gym exercises, decrease pain when transferring   Currently in Pain? No/denies       Objective: Gait: improved trunk rotation, erect posture  Treatment: Therapeutic exercise: patient performed exercises with guidance, verbal and tactile cues and demonstration of PT: Side lying: Hip abduction with assistance x 10 reps each LE  Supine lying: Core control exercises with TrA contraction: hook lying marches x 10  each LE Arms overhead alternating x 10 Dead bug 24-Feb-2023 with opposite arm overhead x 5 reps   Leg press 35# x 15, 45# x 15, 25# x 25 reps Multi hip machine for hip extension 55# x 15 reps each LE Standing ball toss with one foot balanced on BOSU ball; toss 4# ball 15 reps each  Patient response to treatment: improved technique with verbal cuing, repetition, tactile cues.          PT Long Term Goals - 07/27/16 2000      PT LONG TERM GOAL #1   Title Pt will score a 14% on the MODI by 09/07/16 to demonstrate significant improvement in low back function and improved ability to transfer from supine to sitting without pain.   Baseline MODI: 24% dysfunction   Status Revised     PT LONG TERM GOAL #2   Title Patient will be independent with HEP focused on improving muscular endurance, coordination, and strength by 09/07/16 to continue benefits of therapy after discharge.   Baseline dependent for exercise performance and progression. 07/27/2016: moderate cuing required to perform exercise     PT LONG TERM GOAL #3   Title Patient will be able to transfer from sitting to standing without pain/difficulty by 09/07/16 to demonstrate functional improvement in lumbar function.   Baseline Increased pain and difficulty when performing sitting to standing. 07/27/16: intermittently able to perform sit to stand without pain/stiffness  Status Partially Met               Plan - 08/02/16 1955    Clinical Impression Statement progressing well with improving strength. continues with decreased core control and strength and requires verbal cuing and guidance to perform most exercises and will therefore benefit from additional physicla therapy intervention to improve function    Rehab Potential Good   PT Frequency 2x / week   PT Duration 6 weeks   PT Treatment/Interventions Iontophoresis '4mg'$ /ml Dexamethasone;Therapeutic activities;Therapeutic exercise;Moist Heat;Manual techniques;Patient/family  education;Passive range of motion;Cryotherapy;Neuromuscular re-education;Ultrasound   PT Next Visit Plan hip/core stabilization, manual therapy techniques   PT Home Exercise Plan ball squeeze/glute squeeze, seated clamshells, sidelying clamsheells, resisted walking, deagonal wedding 01-27-23 with weights, dead bug 27-Jan-2023, arm/leg raise      Patient will benefit from skilled therapeutic intervention in order to improve the following deficits and impairments:  Pain, Decreased strength, Decreased mobility, Difficulty walking, Decreased range of motion, Increased fascial restricitons, Decreased endurance, Increased muscle spasms, Hypomobility  Visit Diagnosis: Muscle weakness (generalized)  Right-sided low back pain without sciatica  Stiffness of right hip, not elsewhere classified     Problem List Patient Active Problem List   Diagnosis Date Noted  . Hemorrhoids 08/02/2016  . Low back pain 05/17/2016  . Right hip pain 05/17/2016  . Chest tightness 03/01/2016  . Rash 01/31/2015  . Health care maintenance 01/31/2015  . Left knee pain 10/12/2013  . GERD (gastroesophageal reflux disease) 12/29/2012  . Abnormal Pap smear of cervix 12/29/2012  . Essential hypertension, benign 12/25/2012  . Anemia 12/25/2012  . Degeneration of lumbar or lumbosacral intervertebral disc 12/25/2012  . Hypercholesterolemia 12/25/2012  . Vitamin D deficiency 12/25/2012  . Multiple sclerosis ([place]) 12/25/2012    Jomarie Longs PT 08/03/2016, 2:39 PM  [facility] [address] [place], [address] Phone: [phone]   Fax:  [phone]  Name: Jackie White MRN: [id number] Date of Birth: 09-20-1956

## 2016-08-06 ENCOUNTER — Encounter: Payer: Self-pay | Admitting: Internal Medicine

## 2016-08-17 ENCOUNTER — Ambulatory Visit: Payer: BC Managed Care – PPO | Admitting: Physical Therapy

## 2016-08-17 ENCOUNTER — Encounter: Payer: Self-pay | Admitting: Physical Therapy

## 2016-08-17 DIAGNOSIS — M25651 Stiffness of right hip, not elsewhere classified: Secondary | ICD-10-CM

## 2016-08-17 DIAGNOSIS — M6281 Muscle weakness (generalized): Secondary | ICD-10-CM

## 2016-08-17 DIAGNOSIS — M545 Low back pain, unspecified: Secondary | ICD-10-CM

## 2016-08-18 NOTE — Therapy (Signed)
[facility] [address] [place], [address] Phone: [phone]   Fax:  [phone]  Physical Therapy Treatment/Discharge Summary  Patient Details  Name: Jackie White MRN: [id number] Date of Birth: May 12, 1956 Referring Provider: Einar Pheasant MD  Encounter Date: 08/17/2016   Patient began physical therapy on 06/08/16 and attended 11 sessions through 08/17/2016 with goals partially met or achieved. She is independent with home program and should continue to improve with self management. Plan discharge from physical therapy at this time      PT End of Session - 08/17/16 1913    Visit Number 11   Number of Visits 24   Date for PT Re-Evaluation 09/07/16   PT Start Time 1905   PT Stop Time 1936   PT Time Calculation (min) 31 min   Activity Tolerance Patient tolerated treatment well   Behavior During Therapy [facility] for tasks assessed/performed      Past Medical History:  Diagnosis Date  . Abnormal Pap smear   . Cervical disc disease    Cervical and lumbar disc disease  . GERD (gastroesophageal reflux disease)   . History of iron deficiency   . Hypertension   . Multiple sclerosis ([place])   . Personal history of colonic polyps   . Reactive airway disease   . Vitamin D deficiency     Past Surgical History:  Procedure Laterality Date  . BREAST REDUCTION SURGERY    . REDUCTION MAMMAPLASTY Bilateral 2002  . TOTAL ABDOMINAL HYSTERECTOMY  2003   Right Oophrectomy    There were no vitals filed for this visit.      Subjective Assessment - 08/17/16 1905    Subjective Patient reports she feels she knows enough and has good knowledge of exercises to continue to improve independently. She is able to return to performing gym exercises at home.     Limitations Sitting;Standing;House hold activities;Walking   Patient Stated Goals Return to gym exercises, decrease pain when transferring   Currently in Pain?  No/denies      Objective: Gait: improved trunk rotation, erect posture and decreased pain in hips Strength side lying ER both hips left 4/5, right 5/5, abduction left 4/5, right 5/5 Outcome measure: MODI 18% ( initially 24%)  Treatment: Therapeutic exercise: patient performed exercises with guidance, verbal and tactile cues and demonstration of PT: Side lying: Performed clamshells x 10 each LE  Supine lying:  Verbally re assessed:  Core control exercises with TrA contraction: hook lying marches x 10 each LE Arms overhead alternating x 10 Dead bug 02/07/2023 with opposite arm overhead x 5 reps  Patient performed with guidance and VC:  Leg press 35# x 15, 45# x 15, 45# x 25 reps   Patient response to treatment: patient demonstrated good technique and understanding of home exercises with minimal guidance/VC          PT Education - 08/17/16 1912    Education provided Yes   Education Details HEP to continue at discharge    Person(s) Educated Patient   Methods Demonstration;Verbal cues;Explanation   Comprehension Verbalized understanding;Returned demonstration;Verbal cues required             PT Long Term Goals - 08/17/16 1938      PT LONG TERM GOAL #1   Title Pt will score a 14% on the MODI by 09/07/16 to demonstrate significant improvement in low back function and improved ability to transfer from supine to sitting without pain.  Baseline MODI: 24% dysfunction; MODI 08/17/16 18%   Status Partially Met     PT LONG TERM GOAL #2   Title Patient will be independent with HEP focused on improving muscular endurance, coordination, and strength by 09/07/16 to continue benefits of therapy after discharge.   Baseline dependent for exercise performance and progression. 07/27/2016: moderate cuing required to perform exercise; independent with home exercises 08/17/16   Status Achieved     PT LONG TERM GOAL #3   Title Patient will be able to transfer from sitting to standing without  pain/difficulty by 09/07/16 to demonstrate functional improvement in lumbar function.   Baseline Increased pain and difficulty when performing sitting to standing. 07/27/16: intermittently able to perform sit to stand without pain/stiffness; intermittently able to transfer sit to stand without pain in hips   Status Partially Met               Plan - 08/17/16 1937    Clinical Impression Statement Patient has achieved goals for independence with home program and is able to continue on own to progress strength and endurance to improve function with walking and daily tasks with self management. Plan: discharge from physical therapy.   Rehab Potential Good   PT Frequency 2x / week   PT Duration 6 weeks   PT Treatment/Interventions Iontophoresis 67m/ml Dexamethasone;Therapeutic activities;Therapeutic exercise;Moist Heat;Manual techniques;Patient/family education;Passive range of motion;Cryotherapy;Neuromuscular re-education;Ultrasound   PT Next Visit Plan discharge from physical therapy   PT Home Exercise Plan home program   Consulted and Agree with Plan of Care Patient      Patient will benefit from skilled therapeutic intervention in order to improve the following deficits and impairments:  Pain, Decreased strength, Decreased mobility, Difficulty walking, Decreased range of motion, Increased fascial restricitons, Decreased endurance, Increased muscle spasms, Hypomobility  Visit Diagnosis: Muscle weakness (generalized)  Right-sided low back pain without sciatica  Stiffness of right hip, not elsewhere classified     Problem List Patient Active Problem List   Diagnosis Date Noted  . Hemorrhoids 08/02/2016  . Low back pain 05/17/2016  . Right hip pain 05/17/2016  . Chest tightness 03/01/2016  . Rash 01/31/2015  . Health care maintenance 01/31/2015  . Left knee pain 10/12/2013  . GERD (gastroesophageal reflux disease) 12/29/2012  . Abnormal Pap smear of cervix 12/29/2012  .  Essential hypertension, benign 12/25/2012  . Anemia 12/25/2012  . Degeneration of lumbar or lumbosacral intervertebral disc 12/25/2012  . Hypercholesterolemia 12/25/2012  . Vitamin D deficiency 12/25/2012  . Multiple sclerosis ([place] 12/25/2012    BJomarie LongsPT 08/18/2016, 11:08 PM  [facility] [address] [place] [address]Phone: [phone]  Fax:  [phone] Name: MKORBYN VANESMRN: [id number] of Birth: [birth year]

## 2016-08-23 ENCOUNTER — Other Ambulatory Visit: Payer: BC Managed Care – PPO

## 2016-08-24 ENCOUNTER — Encounter: Payer: BC Managed Care – PPO | Admitting: Physical Therapy

## 2016-08-27 ENCOUNTER — Encounter: Payer: Self-pay | Admitting: Internal Medicine

## 2016-09-01 ENCOUNTER — Encounter: Payer: BC Managed Care – PPO | Admitting: Physical Therapy

## 2016-09-07 ENCOUNTER — Encounter: Payer: BC Managed Care – PPO | Admitting: Physical Therapy

## 2016-09-15 ENCOUNTER — Other Ambulatory Visit (INDEPENDENT_AMBULATORY_CARE_PROVIDER_SITE_OTHER): Payer: BC Managed Care – PPO

## 2016-09-15 DIAGNOSIS — E7800 Pure hypercholesterolemia, unspecified: Secondary | ICD-10-CM

## 2016-09-15 DIAGNOSIS — I10 Essential (primary) hypertension: Secondary | ICD-10-CM | POA: Diagnosis not present

## 2016-09-15 LAB — HEPATIC FUNCTION PANEL
ALT: 13 U/L (ref 0–35)
AST: 14 U/L (ref 0–37)
Albumin: 4 g/dL (ref 3.5–5.2)
Alkaline Phosphatase: 71 U/L (ref 39–117)
BILIRUBIN DIRECT: 0.1 mg/dL (ref 0.0–0.3)
BILIRUBIN TOTAL: 0.5 mg/dL (ref 0.2–1.2)
TOTAL PROTEIN: 6.6 g/dL (ref 6.0–8.3)

## 2016-09-15 LAB — BASIC METABOLIC PANEL
BUN: 15 mg/dL (ref 6–23)
CALCIUM: 9.5 mg/dL (ref 8.4–10.5)
CO2: 32 mEq/L (ref 19–32)
Chloride: 104 mEq/L (ref 96–112)
Creatinine, Ser: 0.87 mg/dL (ref 0.40–1.20)
GFR: 85.89 mL/min (ref >60.00)
GLUCOSE: 91 mg/dL (ref 70–99)
POTASSIUM: 4.1 meq/L (ref 3.5–5.1)
Sodium: 142 mEq/L (ref 135–145)

## 2016-09-15 LAB — LIPID PANEL
CHOLESTEROL: 168 mg/dL (ref 0–200)
HDL: 79 mg/dL (ref >39.00)
LDL CALC: 77 mg/dL (ref 0–99)
NonHDL: 89.21
TRIGLYCERIDES: 59 mg/dL (ref 0.0–149.0)
Total CHOL/HDL Ratio: 2
VLDL: 11.8 mg/dL (ref 0.0–40.0)

## 2016-09-17 ENCOUNTER — Encounter: Payer: Self-pay | Admitting: Internal Medicine

## 2016-09-18 ENCOUNTER — Ambulatory Visit
Admission: RE | Admit: 2016-09-18 | Discharge: 2016-09-18 | Disposition: A | Discharge status: Home / Self Care | Payer: BC Managed Care – PPO | Source: Ambulatory Visit | Attending: Internal Medicine | Admitting: Internal Medicine

## 2016-09-18 DIAGNOSIS — Z1231 Encounter for screening mammogram for malignant neoplasm of breast: Secondary | ICD-10-CM | POA: Diagnosis present

## 2016-09-18 DIAGNOSIS — Z1239 Encounter for other screening for malignant neoplasm of breast: Secondary | ICD-10-CM

## 2016-12-06 ENCOUNTER — Ambulatory Visit: Payer: BC Managed Care – PPO | Admitting: Internal Medicine

## 2016-12-25 ENCOUNTER — Encounter: Payer: Self-pay | Admitting: Internal Medicine

## 2016-12-25 ENCOUNTER — Ambulatory Visit (INDEPENDENT_AMBULATORY_CARE_PROVIDER_SITE_OTHER): Payer: BC Managed Care – PPO | Admitting: Internal Medicine

## 2016-12-25 VITALS — BP 124/82 | HR 58 | Temp 98.7°F | Resp 16 | Wt 238.5 lb

## 2016-12-25 DIAGNOSIS — M25512 Pain in left shoulder: Secondary | ICD-10-CM

## 2016-12-25 DIAGNOSIS — I10 Essential (primary) hypertension: Secondary | ICD-10-CM

## 2016-12-25 DIAGNOSIS — J019 Acute sinusitis, unspecified: Secondary | ICD-10-CM | POA: Diagnosis not present

## 2016-12-25 DIAGNOSIS — K219 Gastro-esophageal reflux disease without esophagitis: Secondary | ICD-10-CM

## 2016-12-25 DIAGNOSIS — G35 Multiple sclerosis: Secondary | ICD-10-CM

## 2016-12-25 DIAGNOSIS — M545 Low back pain, unspecified: Secondary | ICD-10-CM

## 2016-12-25 DIAGNOSIS — E7800 Pure hypercholesterolemia, unspecified: Secondary | ICD-10-CM

## 2016-12-25 DIAGNOSIS — E669 Obesity, unspecified: Secondary | ICD-10-CM

## 2016-12-25 DIAGNOSIS — M25511 Pain in right shoulder: Secondary | ICD-10-CM

## 2016-12-25 MED ORDER — AMOXICILLIN 875 MG PO TABS: 875.0000 mg | ORAL_TABLET | Freq: Two times a day (BID) | ORAL | 0 refills | Status: DC

## 2016-12-25 NOTE — Progress Notes (Signed)
Pre visit review using our clinic review tool, if applicable. No additional management support is needed unless otherwise documented below in the visit note. 

## 2016-12-25 NOTE — Progress Notes (Signed)
Patient ID: Jackie White, female   DOB: August 15, 1956, 61 y.o.   MRN: [id number]   Subjective:    Patient ID: Jackie White, female    DOB: [birth year], 61 y.o.   MRN: [id number]  HPI  Patient here for a scheduled follow up.  She reports she has had increased drainage over the last week.  Tickle in her throat.  Some cough, occasionally productive.  No increased sob.  Minimal diarrhea.  Eating.  No nausea or vomiting.   She reports bilateral shoulder pain.  Hurts when reaches posteriorly.  Low back is stiff.  Worse in am.  No radiation of pain.  Better once starts moving.  She has made some adjustments in her chair.  Doing therapy exercises.  Discussed diet and weight loss.  Request referral to nutritionist.     Past Medical History:  Diagnosis Date  . Abnormal Pap smear   . Cervical disc disease    Cervical and lumbar disc disease  . GERD (gastroesophageal reflux disease)   . History of iron deficiency   . Hypertension   . Multiple sclerosis (HCC)   . Personal history of colonic polyps   . Reactive airway disease   . Vitamin D deficiency    Past Surgical History:  Procedure Laterality Date  . BREAST REDUCTION SURGERY    . REDUCTION MAMMAPLASTY Bilateral 2002  . TOTAL ABDOMINAL HYSTERECTOMY  2003   Right Oophrectomy   Family History  Problem Relation Age of Onset  . Stroke Mother   . Hypertension Mother   . Heart disease Maternal Grandmother   . Breast cancer Neg Hx    Social History   Social History  . Marital status: Married    Spouse name: N/A  . Number of children: N/A  . Years of education: N/A   Social History Main Topics  . Smoking status: Never Smoker  . Smokeless tobacco: Never Used  . Alcohol use 0.0 oz/week  . Drug use: No  . Sexual activity: Not Asked   Other Topics Concern  . None   Social History Narrative  . None    Outpatient Encounter Prescriptions as of 12/25/2016  Medication Sig  . Dimethyl Fumarate (TECFIDERA) 240 MG CPDR Take by mouth 2  (two) times daily.  . metoprolol succinate (TOPROL-XL) 25 MG 24 hr tablet TAKE 1 TABLET BY MOUTH EVERY DAY  . NEXIUM 24HR 20 MG capsule TAKE 1 CAPSULE BY MOUTH DAILY BEFORE BREAKFAST  . Ocrelizumab (OCREVUS IV) Inject into the vein.  . valsartan-hydrochlorothiazide (DIOVAN-HCT) 320-25 MG tablet TAKE 1 TABLET BY MOUTH EVERY DAY  . VITAMIN D, ERGOCALCIFEROL, PO Take by mouth.  Marland Kitchen amoxicillin (AMOXIL) 875 MG tablet Take 1 tablet (875 mg total) by mouth 2 (two) times daily.   No facility-administered encounter medications on file as of 12/25/2016.     Review of Systems  Constitutional: Negative for appetite change and unexpected weight change.  HENT: Positive for congestion and postnasal drip. Negative for sinus pressure.   Respiratory: Positive for cough. Negative for chest tightness and shortness of breath.   Cardiovascular: Negative for chest pain and palpitations.  Gastrointestinal: Negative for abdominal pain, nausea and vomiting.       Minimal diarrhea.   Genitourinary: Negative for difficulty urinating and dysuria.  Musculoskeletal: Positive for back pain.       Bilateral shoulder pain.    Skin: Negative for color change and rash.  Neurological: Negative for dizziness, light-headedness and headaches.  Psychiatric/Behavioral: Negative for agitation  and dysphoric mood.       Objective:    Physical Exam  Constitutional: She appears well-developed and well-nourished. No distress.  HENT:  Nose: Nose normal.  Mouth/Throat: Oropharynx is clear and moist.  Neck: Neck supple. No thyromegaly present.  Cardiovascular: Normal rate and regular rhythm.   Pulmonary/Chest: Breath sounds normal. No respiratory distress. She has no wheezes.  Abdominal: Soft. Bowel sounds are normal. There is no tenderness.  Musculoskeletal: She exhibits no edema or tenderness.  Lymphadenopathy:    She has no cervical adenopathy.  Skin: No rash noted. No erythema.  Psychiatric: She has a normal mood and  affect. Her behavior is normal.    BP 124/82 (BP Location: Left Arm, Patient Position: Sitting, Cuff Size: Large)   Pulse (!) 58   Temp 98.7 F (37.1 C) (Oral)   Resp 16   Wt 238 lb 8 oz (108.2 kg)   LMP 12/27/2000   SpO2 96%   BMI 36.26 kg/m  Wt Readings from Last 3 Encounters:  12/25/16 238 lb 8 oz (108.2 kg)  08/02/16 230 lb 9.6 oz (104.6 kg)  05/17/16 233 lb 6 oz (105.9 kg)     Lab Results  Component Value Date   WBC 4.2 03/01/2016   HGB 13.5 03/01/2016   HCT 41.0 03/01/2016   PLT 283 03/01/2016   GLUCOSE 91 09/15/2016   CHOL 168 09/15/2016   TRIG 59.0 09/15/2016   HDL 79.00 09/15/2016   LDLCALC 77 09/15/2016   ALT 13 09/15/2016   AST 14 09/15/2016   NA 142 09/15/2016   K 4.1 09/15/2016   CL 104 09/15/2016   CREATININE 0.87 09/15/2016   BUN 15 09/15/2016   CO2 32 09/15/2016   TSH 1.00 01/28/2016    Mm Screening Breast Tomo Bilateral  Result Date: 09/18/2016 CLINICAL DATA:  Screening. EXAM: 2D DIGITAL SCREENING BILATERAL MAMMOGRAM WITH CAD AND ADJUNCT TOMO COMPARISON:  Previous exam(s). ACR Breast Density Category a: The breast tissue is almost entirely fatty. FINDINGS: There are no findings suspicious for malignancy. Images were processed with CAD. IMPRESSION: No mammographic evidence of malignancy. A result letter of this screening mammogram will be mailed directly to the patient. RECOMMENDATION: Screening mammogram in one year. (Code:SM-B-01Y) BI-RADS CATEGORY  1: Negative. Electronically Signed   By: Britta Mccreedy M.D.   On: 09/18/2016 16:29       Assessment & Plan:   Problem List Items Addressed This Visit    Essential hypertension, benign    Blood pressure under good control.  Continue same medication regimen.  Follow pressures.  Follow metabolic panel.        GERD (gastroesophageal reflux disease)    Controlled on nexium.        Hypercholesterolemia    Low cholesterol diet and exercise.  Follow lipid panel.        Low back pain    Some pain  that is better after she gets moving.  Stretches. Continued exercise.  Will notify me if desires further intervention.        Multiple sclerosis (HCC)    Followed by neurology.  Overall she feels stable.  Follow.         Other Visit Diagnoses    Acute sinusitis, recurrence not specified, unspecified location    -  Primary   persistent symptoms.  treat with amoxicillin.  nasacort, saline as directed.  follow.     Relevant Medications   amoxicillin (AMOXIL) 875 MG tablet   Acute pain of both  shoulders       stretches/exercise.  notify me if desires further w/up.     Obesity (BMI 30-39.9)       refer to a nutritionist for evaluation and recommendations.     Relevant Orders   Amb ref to Medical Nutrition Therapy-MNT       Dale [place], MD

## 2016-12-25 NOTE — Patient Instructions (Signed)
Saline nasal spray - flush nose at least 2-3 x/day  nasacort nasal spray - 2 sprays each nostril one time per day.  Do this in the evening.    mucinex  If symptoms worsen, take the antibiotic.    If you take the antibiotic, take a probiotic daily while you are on the antibiotic and for two weeks after completing the antibiotic.

## 2017-01-01 ENCOUNTER — Encounter: Payer: Self-pay | Admitting: Internal Medicine

## 2017-01-01 NOTE — Assessment & Plan Note (Signed)
Followed by neurology.  Overall she feels stable.  Follow.

## 2017-01-01 NOTE — Assessment & Plan Note (Signed)
Controlled on nexium.  

## 2017-01-01 NOTE — Assessment & Plan Note (Signed)
Low cholesterol diet and exercise.  Follow lipid panel.   

## 2017-01-01 NOTE — Assessment & Plan Note (Signed)
Blood pressure under good control.  Continue same medication regimen.  Follow pressures.  Follow metabolic panel.   

## 2017-01-01 NOTE — Assessment & Plan Note (Signed)
Some pain that is better after she gets moving.  Stretches. Continued exercise.  Will notify me if desires further intervention.

## 2017-01-16 ENCOUNTER — Encounter: Payer: Self-pay | Admitting: Dietician

## 2017-01-16 ENCOUNTER — Encounter: Payer: BC Managed Care – PPO | Attending: Internal Medicine | Admitting: Dietician

## 2017-01-16 VITALS — Ht 67.0 in | Wt 234.2 lb

## 2017-01-16 DIAGNOSIS — Z6830 Body mass index (BMI) 30.0-30.9, adult: Secondary | ICD-10-CM

## 2017-01-16 DIAGNOSIS — E6609 Other obesity due to excess calories: Secondary | ICD-10-CM

## 2017-01-16 NOTE — Patient Instructions (Signed)
Balance meals with 2-3 oz protein, 2-3 servings of carbohydrate and non-starchy vegetables. Spread 12 servings of carbohydrate over 3 meals and 2-3 snacks. Consider tracking food/beverage intake on myfitnesspal. Exercise: walk at work 2 days per week+ at home for 2 days per week.

## 2017-01-16 NOTE — Progress Notes (Signed)
Medical Nutrition Therapy: Visit start time: 1420 end time: 1505  Assessment:  Diagnosis: obesity Past medical history: hypertension, MS Psychosocial issues/ stress concerns: none identified Preferred learning method:  Jill Alexanders . Hands-on  Current weight: 234.2 lbs  Height:67 in Medications, supplements: see list Progress and evaluation:  Patient in for initial  medical nutrition therapy appointment. She gives a goal weight of 175 lbs which she weighed approximately 6 years ago. She states, "I think I just got lazy" regarding being mindful regarding food as well as no longer doing structured exercise. She works 4 (10 hour) days and finds it difficult to find time to exercise. She eats 3 meals per day and in general makes healthy food choices. She reports that snacks are a problem area for her. A bowl of chocolate candy is kept in her work area and she often eats 5-6+ pieces in the afternoon.  Physical activity: none; Patient has an exercise bike and elliptical at home but doesn't use. She states she has been encouraged to exercise to help both arthritis and MS.   Dietary Intake:  Usual eating pattern includes 3 meals and 2-3 snacks per day. Dining out frequency: 1 meals per week.  Breakfast: 7:45am- [place] yogurt with walnuts or 2 boiled eggs, [place] bacon or sausage, black coffee, water Lunch: 1:30pm- [place] or [place] salad or some type of meat and steamed vegetables (brings lunch from home)  Snack: chocolate candy, water Supper: 7:15pm- baked or broiled meat, rice or potato (white or sweet), steamed vegetables Snack: sometimes candy Beverages: water (at least 8 cups per day), black coffee, Occasionally lemonade  Nutrition Care Education: Basic nutrition: Reviewed food group servings needed to meet basic nutrient needs.   Weight control: Commended on her present meal pattern and meal food choices. Instructed on a meal plan based on 1700 calories but encouraged to use as a guide toward  more mindful eating rather than to be overly restrictive. Discussed how snacks can help add food group servings that are lacking such as fruits, yogurt, etc.  With her love for chocolate, discussed allowing 1-2 pieces at work in the afternoon added to a snack containing protein and a serving of carbohydrate but to avoid keeping chocolate at home.  Discussed her success in the past with weight control when she coupled healthy eating with exercise habits. Discussed options for exercise presently.  Nutritional Diagnosis:  [place]-3.3 Overweight/obesity As related to snacking and lack of exercise.  As evidenced by diet and exercise history..  Intervention:  Balance meals with 2-3 oz protein, 2-3 servings of carbohydrate and non-starchy vegetables. Spread 12 servings of carbohydrate over 3 meals and 2-3 snacks. Consider tracking food/beverage intake on myfitnesspal. Exercise: walk at work 2 days per week+ at home for 2 days per week.  Education Materials given:  . Plate Planner . Sample meal pattern/ menus . Goals/ instructions Learner/ who was taught:  . Patient  Level of understanding: . Partial understanding; needs review/ practice Demonstrated degree of understanding via:   Teach back Learning barriers: . None  Willingness to learn/ readiness for change: . Acceptance, ready for change  Monitoring and Evaluation:  Dietary intake, exercise, , and body weight      follow up: 02/13/17 at 3:30pm

## 2017-02-13 ENCOUNTER — Encounter: Payer: BC Managed Care – PPO | Attending: Internal Medicine | Admitting: Dietician

## 2017-02-13 ENCOUNTER — Encounter: Payer: Self-pay | Admitting: Dietician

## 2017-02-13 VITALS — Ht 67.0 in | Wt 233.9 lb

## 2017-02-13 DIAGNOSIS — Z6830 Body mass index (BMI) 30.0-30.9, adult: Secondary | ICD-10-CM

## 2017-02-13 DIAGNOSIS — E6609 Other obesity due to excess calories: Secondary | ICD-10-CM

## 2017-02-13 NOTE — Patient Instructions (Signed)
Resume exercise, exercise bike-30 minutes + walking at work for 15 minutes. Continue to be mindful of food and beverage intake working to balance protein, carbohydrate, fat and non-starchy vegetables with goal of 5 servings of fruits/vegetables  Include at least 3 milk products (calcium sources) per day. Refer to Calcium list. Goal for sodium is no more than 1500 mg/day.

## 2017-02-13 NOTE — Progress Notes (Signed)
Medical Nutrition Therapy Follow-up visit:  Time with patient: 1515-1600 Visit #:2 ASSESSMENT:  Diagnosis:obesity  Current weight:233.9 lbs  Height:67 in Medications: See list Medical History: hypertension, MS Progress and evaluation: Patient in for medical nutrition follow-up appointment. She states for after her initial appointment she began an exercise routing of 30 minutes on her exercise bike and 15 minutes of walking while "on break" at work. She also was more mindful of portions and stopped eating an evening snack. She reports she lost 2-3 lbs. She then had an infusion treatment for her MS and states she was stopped her exercise in the past 7-10 days. Overall, her weight has remained stable in the past month. She also used myfitnesspal to record her food/beverage intake and states this helped her to be more mindful. She continues to make healthy food choices overall with 3 meals and 1-2 small snacks. She states that she is ready to resume exercise schedule.  Physical activity:none presently  NUTRITION CARE EDUCATION: Basic nutrition: Discussed calcium needs; gave and reviewed a list of foods and calcium content.   Weight control:Discussed how structured exercise is the key for sustained weight loss. Also stressed again mindful eating verses restriction.  Hypertension:  importance of controlling BP, identifying high sodium foods, identifying food sources of Calcium, potassium,  INTERVENTION:  Resume exercise, exercise bike-30 minutes + walking at work for 15 minutes. Continue to be mindful of food and beverage intake working to balance protein, carbohydrate, fat and non-starchy vegetables with goal of 5 servings of fruits/vegetables  Include at least 3 milk products (calcium sources) per day. Refer to Calcium list. Goal for sodium is no more than 1500 mg/day.  EDUCATION MATERIALS GIVEN:  . List of foods and calcium content. . Goals/ instructions  LEARNER/ who was taught:   . Patient   LEVEL OF UNDERSTANDING: . Verbalizes/ demonstrates competency LEARNING BARRIERS: . None WILLINGNESS TO LEARN/READINESS FOR CHANGE: . Change in Progress  MONITORING AND EVALUATION:  Weight, diet, exercise  Follow up: 03/19/17 at 1:30pm

## 2017-02-22 ENCOUNTER — Other Ambulatory Visit: Payer: Self-pay | Admitting: Internal Medicine

## 2017-02-26 ENCOUNTER — Other Ambulatory Visit: Payer: Self-pay | Admitting: Internal Medicine

## 2017-03-22 ENCOUNTER — Encounter: Payer: Self-pay | Admitting: Dietician

## 2017-04-24 ENCOUNTER — Encounter: Payer: Self-pay | Admitting: Internal Medicine

## 2017-04-24 ENCOUNTER — Ambulatory Visit (INDEPENDENT_AMBULATORY_CARE_PROVIDER_SITE_OTHER): Payer: BC Managed Care – PPO | Admitting: Internal Medicine

## 2017-04-24 ENCOUNTER — Ambulatory Visit (INDEPENDENT_AMBULATORY_CARE_PROVIDER_SITE_OTHER): Payer: BC Managed Care – PPO

## 2017-04-24 VITALS — BP 110/68 | HR 69 | Temp 98.6°F | Resp 12 | Ht 67.0 in | Wt 224.6 lb

## 2017-04-24 DIAGNOSIS — G35 Multiple sclerosis: Secondary | ICD-10-CM | POA: Diagnosis not present

## 2017-04-24 DIAGNOSIS — M25561 Pain in right knee: Secondary | ICD-10-CM | POA: Diagnosis not present

## 2017-04-24 DIAGNOSIS — I10 Essential (primary) hypertension: Secondary | ICD-10-CM | POA: Diagnosis not present

## 2017-04-24 DIAGNOSIS — M25512 Pain in left shoulder: Secondary | ICD-10-CM

## 2017-04-24 DIAGNOSIS — E7800 Pure hypercholesterolemia, unspecified: Secondary | ICD-10-CM | POA: Diagnosis not present

## 2017-04-24 DIAGNOSIS — M25551 Pain in right hip: Secondary | ICD-10-CM

## 2017-04-24 LAB — BASIC METABOLIC PANEL
BUN: 11 mg/dL (ref 6–23)
CALCIUM: 9.7 mg/dL (ref 8.4–10.5)
CO2: 32 meq/L (ref 19–32)
CREATININE: 0.83 mg/dL (ref 0.40–1.20)
Chloride: 101 mEq/L (ref 96–112)
GFR: 90.5 mL/min (ref >60.00)
GLUCOSE: 94 mg/dL (ref 70–99)
Potassium: 4.1 mEq/L (ref 3.5–5.1)
Sodium: 139 mEq/L (ref 135–145)

## 2017-04-24 NOTE — Progress Notes (Signed)
Patient ID: Jackie White, female   DOB: 1956-06-24, 61 y.o.   MRN: [id number]   Subjective:    Patient ID: Jackie White, female    DOB: December 04, 1955, 61 y.o.   MRN: [id number]  HPI  Patient here for a scheduled follow up.  She reports that she is having persistent right hip pain.  States worse if she has been sitting or lying and then gets up.  Better once moving around.  Also reports increased right knee pain.  Started and worsening - over the last 2-3 weeks.  No injury or trauma.  Has been swollen and warm.  Has been wearing a brace, using ice and elevating.  Swelling is better.  No redness.  Also reports left shoulder pain.  On occasion, will wake her.  Has been taking advil.  Does help, but still with pain.  No chest pain.  No sob.  No acid reflux.  No abdominal pain.  Seeing neurology for MS.  Stable.  Just evaluated 12/2016.  Recommended f/u in 6 months.  Has adjusted diet.  Lost weight.     Past Medical History:  Diagnosis Date  . Abnormal Pap smear   . Cervical disc disease    Cervical and lumbar disc disease  . GERD (gastroesophageal reflux disease)   . History of iron deficiency   . Hypertension   . Multiple sclerosis (HCC)   . Personal history of colonic polyps   . Reactive airway disease   . Vitamin D deficiency    Past Surgical History:  Procedure Laterality Date  . BREAST REDUCTION SURGERY    . REDUCTION MAMMAPLASTY Bilateral 2002  . TOTAL ABDOMINAL HYSTERECTOMY  2003   Right Oophrectomy   Family History  Problem Relation Age of Onset  . Stroke Mother   . Hypertension Mother   . Heart disease Maternal Grandmother   . Breast cancer Neg Hx    Social History   Social History  . Marital status: Married    Spouse name: N/A  . Number of children: N/A  . Years of education: N/A   Social History Main Topics  . Smoking status: Never Smoker  . Smokeless tobacco: Never Used  . Alcohol use 0.0 oz/week  . Drug use: No  . Sexual activity: Not Asked   Other  Topics Concern  . None   Social History Narrative  . None    Outpatient Encounter Prescriptions as of 04/24/2017  Medication Sig  . metoprolol succinate (TOPROL-XL) 25 MG 24 hr tablet TAKE 1 TABLET(25 MG) BY MOUTH DAILY  . Ocrelizumab (OCREVUS IV) Inject into the vein.  . valsartan-hydrochlorothiazide (DIOVAN-HCT) 320-25 MG tablet TAKE 1 TABLET BY MOUTH EVERY DAY  . VITAMIN D, ERGOCALCIFEROL, PO Take by mouth.  . [DISCONTINUED] amoxicillin (AMOXIL) 875 MG tablet Take 1 tablet (875 mg total) by mouth 2 (two) times daily. (Patient not taking: Reported on 01/16/2017)  . [DISCONTINUED] Dimethyl Fumarate (TECFIDERA) 240 MG CPDR Take by mouth 2 (two) times daily.  . [DISCONTINUED] metoprolol succinate (TOPROL-XL) 25 MG 24 hr tablet TAKE 1 TABLET BY MOUTH EVERY DAY  . [DISCONTINUED] NEXIUM 24HR 20 MG capsule TAKE 1 CAPSULE BY MOUTH DAILY BEFORE BREAKFAST (Patient not taking: Reported on 01/16/2017)   No facility-administered encounter medications on file as of 04/24/2017.     Review of Systems  Constitutional: Negative for appetite change and unexpected weight change.  HENT: Negative for congestion and sinus pressure.   Respiratory: Negative for cough, chest tightness and shortness of  breath.   Cardiovascular: Negative for chest pain, palpitations and leg swelling.  Gastrointestinal: Negative for abdominal pain, diarrhea, nausea and vomiting.  Genitourinary: Negative for difficulty urinating and dysuria.  Musculoskeletal:       Right hip and knee pain as outlined.  Left shoulder pain as outlined.    Skin: Negative for color change and rash.  Neurological: Negative for dizziness, light-headedness and headaches.  Psychiatric/Behavioral: Negative for agitation and dysphoric mood.       Objective:     Blood pressure rechecked by me:  118/78  Physical Exam  Constitutional: She appears well-developed and well-nourished. No distress.  HENT:  Nose: Nose normal.  Mouth/Throat: Oropharynx is  clear and moist.  Neck: Neck supple. No thyromegaly present.  Cardiovascular: Normal rate and regular rhythm.   Pulmonary/Chest: Breath sounds normal. No respiratory distress. She has no wheezes.  Abdominal: Soft. Bowel sounds are normal. There is no tenderness.  Musculoskeletal: She exhibits no edema.  Minimal increased soft tissue swelling - right knee.  Increased pain to palpation - right knee.  Some tenderness - right calf.  No increased erythema.  No increased warmth.  Increased pain - right hip with rotation of her right leg and abduction/adduction of right leg.  Some pain with attempts at full extension and abduction of right arm.    Lymphadenopathy:    She has no cervical adenopathy.  Skin: No rash noted. No erythema.  Psychiatric: She has a normal mood and affect. Her behavior is normal.    BP 110/68 (BP Location: Left Arm, Patient Position: Sitting, Cuff Size: Large)   Pulse 69   Temp 98.6 F (37 C) (Oral)   Resp 12   Ht 5\' 7"  (1.702 m)   Wt 224 lb 9.6 oz (101.9 kg)   LMP 12/27/2000   SpO2 97%   BMI 35.18 kg/m  Wt Readings from Last 3 Encounters:  04/24/17 224 lb 9.6 oz (101.9 kg)  02/13/17 233 lb 14.4 oz (106.1 kg)  01/16/17 234 lb 3.2 oz (106.2 kg)     Lab Results  Component Value Date   WBC 4.2 03/01/2016   HGB 13.5 03/01/2016   HCT 41.0 03/01/2016   PLT 283 03/01/2016   GLUCOSE 94 04/24/2017   CHOL 168 09/15/2016   TRIG 59.0 09/15/2016   HDL 79.00 09/15/2016   LDLCALC 77 09/15/2016   ALT 13 09/15/2016   AST 14 09/15/2016   NA 139 04/24/2017   K 4.1 04/24/2017   CL 101 04/24/2017   CREATININE 0.83 04/24/2017   BUN 11 04/24/2017   CO2 32 04/24/2017   TSH 1.00 01/28/2016    Mm Screening Breast Tomo Bilateral  Result Date: 09/18/2016 CLINICAL DATA:  Screening. EXAM: 2D DIGITAL SCREENING BILATERAL MAMMOGRAM WITH CAD AND ADJUNCT TOMO COMPARISON:  Previous exam(s). ACR Breast Density Category a: The breast tissue is almost entirely fatty. FINDINGS: There  are no findings suspicious for malignancy. Images were processed with CAD. IMPRESSION: No mammographic evidence of malignancy. A result letter of this screening mammogram will be mailed directly to the patient. RECOMMENDATION: Screening mammogram in one year. (Code:SM-B-01Y) BI-RADS CATEGORY  1: Negative. Electronically Signed   By: Britta Mccreedy M.D.   On: 09/18/2016 16:29       Assessment & Plan:   Problem List Items Addressed This Visit    Essential hypertension, benign    Blood pressure under good control.  Continue same medication regimen.  Follow pressures.  Follow metabolic panel.  Relevant Orders   Basic metabolic panel (Completed)   Hypercholesterolemia    Low cholesterol diet and exercise.  Follow lipid panel.        Left shoulder pain    Persistent pain.  Taking advil.  Check xray.        Relevant Orders   DG Shoulder Left (Completed)   Multiple sclerosis (HCC)    Followed by neurology.  Stable.        Right hip pain - Primary    Persistent pain.  Check xray.  Taking advil.        Relevant Orders   DG HIP UNILAT WITH PELVIS 2-3 VIEWS RIGHT (Completed)   Right knee pain    Swelling is better.  Still with increased pain as outlined.  Exam as outlined.  Some tenderness over the right calf.  Wanted to pursue lower extremity ultrasound.  She declines.  Discussed risk of untreated clot, etc.  She continues to decline.  Agreed to xray.  Follow.        Relevant Orders   DG Knee 1-2 Views Right (Completed)       Dale Marshall, MD

## 2017-04-24 NOTE — Progress Notes (Signed)
Pre-visit discussion using our clinic review tool. No additional management support is needed unless otherwise documented below in the visit note.  

## 2017-04-25 ENCOUNTER — Encounter: Payer: Self-pay | Admitting: Internal Medicine

## 2017-04-25 ENCOUNTER — Other Ambulatory Visit: Payer: Self-pay | Admitting: Internal Medicine

## 2017-04-25 DIAGNOSIS — M25561 Pain in right knee: Secondary | ICD-10-CM

## 2017-04-25 DIAGNOSIS — M25512 Pain in left shoulder: Secondary | ICD-10-CM

## 2017-04-25 NOTE — Progress Notes (Signed)
Order placed for ortho referral.   

## 2017-04-26 ENCOUNTER — Encounter: Payer: Self-pay | Admitting: Internal Medicine

## 2017-04-26 NOTE — Assessment & Plan Note (Signed)
Low cholesterol diet and exercise.  Follow lipid panel.   

## 2017-04-26 NOTE — Assessment & Plan Note (Signed)
Followed by neurology.  Stable  

## 2017-04-26 NOTE — Assessment & Plan Note (Signed)
Blood pressure under good control.  Continue same medication regimen.  Follow pressures.  Follow metabolic panel.   

## 2017-04-26 NOTE — Assessment & Plan Note (Signed)
Persistent pain.  Check xray.  Taking advil.

## 2017-04-26 NOTE — Assessment & Plan Note (Signed)
Persistent pain.  Taking advil.  Check xray.

## 2017-04-26 NOTE — Assessment & Plan Note (Signed)
Swelling is better.  Still with increased pain as outlined.  Exam as outlined.  Some tenderness over the right calf.  Wanted to pursue lower extremity ultrasound.  She declines.  Discussed risk of untreated clot, etc.  She continues to decline.  Agreed to xray.  Follow.

## 2017-04-27 ENCOUNTER — Encounter: Payer: Self-pay | Admitting: Internal Medicine

## 2017-04-30 ENCOUNTER — Encounter: Payer: Self-pay | Admitting: Internal Medicine

## 2017-04-30 NOTE — Telephone Encounter (Signed)
Not sure if you have seen this, but she wants to keep the appt scheduled.  Thanks.    Dr Lorin Picket

## 2017-05-01 DIAGNOSIS — M7061 Trochanteric bursitis, right hip: Secondary | ICD-10-CM | POA: Insufficient documentation

## 2017-05-01 DIAGNOSIS — M7542 Impingement syndrome of left shoulder: Secondary | ICD-10-CM | POA: Insufficient documentation

## 2017-05-01 DIAGNOSIS — M179 Osteoarthritis of knee, unspecified: Secondary | ICD-10-CM | POA: Insufficient documentation

## 2017-05-01 DIAGNOSIS — M1710 Unilateral primary osteoarthritis, unspecified knee: Secondary | ICD-10-CM | POA: Insufficient documentation

## 2017-05-02 ENCOUNTER — Encounter: Payer: Self-pay | Admitting: Physical Therapy

## 2017-05-11 ENCOUNTER — Encounter: Payer: Self-pay | Admitting: Physical Therapy

## 2017-05-21 ENCOUNTER — Encounter: Payer: Self-pay | Admitting: Physical Therapy

## 2017-05-21 ENCOUNTER — Ambulatory Visit: Payer: BC Managed Care – PPO | Attending: Orthopedic Surgery | Admitting: Physical Therapy

## 2017-05-21 DIAGNOSIS — M25561 Pain in right knee: Secondary | ICD-10-CM | POA: Diagnosis present

## 2017-05-21 DIAGNOSIS — M25661 Stiffness of right knee, not elsewhere classified: Secondary | ICD-10-CM | POA: Insufficient documentation

## 2017-05-21 DIAGNOSIS — M6281 Muscle weakness (generalized): Secondary | ICD-10-CM

## 2017-05-21 NOTE — Therapy (Signed)
[place] [facility]ICINE [address], [place], [address] Phone: [phone]   Fax:  [phone]  Physical Therapy Evaluation  Patient Details  Name: Jackie White MRN: [id number] Date of Birth: 01/08/1956 Referring Provider: Patsy Lager MD  Encounter Date: 05/21/2017      PT End of Session - 05/21/17 1118    Visit Number 1   Number of Visits 16   Date for PT Re-Evaluation 07/16/17   PT Start Time 0902   PT Stop Time 1000   PT Time Calculation (min) 58 min   Activity Tolerance Patient tolerated treatment well   Behavior During Therapy [facility] for tasks assessed/performed      Past Medical History:  Diagnosis Date  . Abnormal Pap smear   . Cervical disc disease    Cervical and lumbar disc disease  . GERD (gastroesophageal reflux disease)   . History of iron deficiency   . Hypertension   . Multiple sclerosis (HCC)   . Personal history of colonic polyps   . Reactive airway disease   . Vitamin D deficiency     Past Surgical History:  Procedure Laterality Date  . BREAST REDUCTION SURGERY    . REDUCTION MAMMAPLASTY Bilateral 2002  . TOTAL ABDOMINAL HYSTERECTOMY  2003   Right Oophrectomy    There were no vitals filed for this visit.       Subjective Assessment - 05/21/17 0913    Subjective Patient reports she is having pain in right knee with swelling that limits her daily tasks for sitting, standing and walking.    Pertinent History May 2018 patient reports her right knee began hurting her and caused her to limp with walking, no apparent reason.    Limitations Standing;House hold activities;Walking;Sitting;Other (comment)  prolonged sitting    Diagnostic tests MRI right knee; waiting for results   Patient Stated Goals decrease pain and be able to walk with less difficulty and sit without pain   Currently in Pain? Yes   Pain Score 4   4/10 pain at rest and worst 8-9/10   Pain Location Knee   Pain  Orientation Right   Pain Descriptors / Indicators Aching   Pain Type Acute pain   Pain Onset More than a month ago   Pain Frequency Constant   Aggravating Factors  sitting prolonged, standiing and walking   Pain Relieving Factors medication, Advil, rest, elevation, topical creams            [facility] PT Assessment - 05/21/17 0924      Assessment   Medical Diagnosis right knee effusion, pain   Referring Provider Patsy Lager MD   Onset Date/Surgical Date 03/20/17   Hand Dominance Right   Next MD Visit 06/08/2017   Prior Therapy for low back pain, right hip pain 2017     Balance Screen   Has the patient fallen in the past 6 months No   Has the patient had a decrease in activity level because of a fear of falling?  No   Is the patient reluctant to leave their home because of a fear of falling?  No     Home Environment   Living Environment Private residence   Living Arrangements Spouse/significant other   Type of Home House   Home Access Stairs to enter   Entrance Stairs-Number of Steps 8   Entrance Stairs-Rails Right   Home Layout Two level     Prior Function   Level of  Independence Independent   Vocation Full time employment   Vocation Requirements care management, sitting at computer, calls   Leisure outdoor activities     Cognition   Overall Cognitive Status Within Functional Limits for tasks assessed     Observation/Other Assessments   Lower Extremity Functional Scale  46/80     Sensation   Light Touch Appears Intact     ROM / Strength   AROM / PROM / Strength AROM;Strength     AROM   Overall AROM  Deficits;Due to pain;Other (comment)  swelling of right knee limits ROM   Overall AROM Comments decreased AROM right knee 5-105 degrees flexion wiht pain and swelling limting further motion     Strength   Overall Strength Other (comment)   Overall Strength Comments grossly WFL both LE's: limited assessement due to pain and swelling right knee, patient ambulation  independently without AD and able to perform daily activities with difficulty     Palpation   Patella mobility right knee WFL all glides    Palpation comment right knee with tenderness posteriorly and around patella      Special Tests    Special Tests Meniscus Tests   Meniscus Tests --  deferred due to pain, swelling     Ambulation/Gait   Ambulation/Gait --  independent without AD with antalgic gait pattern        Objective measurements completed on examination: See above findings.    Treatment: Modalities: 15 min. Electrical stimulation: Russian stim. 10/10 cycle applied (2) electrodes to quadriceps/VMO right LE with patient reclined with LE supported on pillow; pt. Performing quad sets with each cycle; goal muscle re education High volt estim.clincial program for muscle spasms/pain  (2) electrodes applied to right knee, medial and lateral aspects, intensity to tolerance with patient in reclined position with LE supported on pillow goal: pain, spasms  Therapeutic exercise: patient performed with demonstration, verbal cues and tactile cues of therapist: goal: independent with home program, improve LEFS, pain Sitting:  AAROM right knee with rolling ball underfoot through pain free range Quad setting siting and supine with glute sets supine lying Hip adduction with ball between knees with glute sets  Patient response to treatment: patient demonstrated improved technique with exercises with minimal VC for correct alignment. Patient with decreased pain from  4/10 to 3/10 with ambulating following session. Improved motor control with repetition during and following estim.       PT Education - 05/21/17 1116    Education provided Yes   Education Details POC and HEP: quad sets sitting and supine lying with glute sets, ball underfoot for knee flexion, ball between knees for hip adduction with glute sets; use of ice and elevation for pain and swelling control   Person(s) Educated  Patient   Methods Explanation;Demonstration;Verbal cues;Handout   Comprehension Verbalized understanding;Returned demonstration;Verbal cues required             PT Long Term Goals - 05/21/17 1129      PT LONG TERM GOAL #1   Title Patient will demonstrate improved function right LE for daily activities including sitting, standing, walking with LEFS score of 60/80 by 07/16/2017   Baseline LEFS 46/80   Status New     PT LONG TERM GOAL #2   Title Patient will be independent with home program for flexibility, strengthening and pain control by 07/16/2017 to allow transition to self management once discharged from physical therapy   Baseline limited knowledge, requires cuing and guidance for appropriate exercise  and performance   Status New                Plan - 05/21/17 1118    Clinical Impression Statement Patient is a 61 year old female who presents with pain and swelling in right knee since 03/2017 without change in symptoms. She has limited AROM right knee, 5-105 degrees with pain and swelling limiting further motion. She is limited in function with daily tasks incluting sitting, standing and walking. She is unable to participate in her usual recreational activties due to her pain. She has limited knowledge of appropriate pain control strategies and progression of exercises and will benefit from physical therapay intervention to address limitations.    History and Personal Factors relevant to plan of care: pain began 03/2017 for no aparent reason and symptoms of pain and swelling are reported as the same as when theay began. She is limited in all daily activties involving sitting, standing and walking.    Clinical Presentation Stable   Clinical Presentation due to: unchanged symptoms from onset of pain   Clinical Decision Making Low   Rehab Potential Good   Clinical Impairments Affecting Rehab Potential (+)motivated, active lifestyle prior level of function(-)comorbidities MS, prior  knee pain    PT Frequency 2x / week   PT Duration 6 weeks   PT Treatment/Interventions Iontophoresis 4mg /ml Dexamethasone;Therapeutic activities;Therapeutic exercise;Moist Heat;Manual techniques;Patient/family education;Cryotherapy;Neuromuscular re-education;Ultrasound;Electrical Stimulation   PT Next Visit Plan electrical stimulation for pain control, meuromuscular re-education   PT Home Exercise Plan hip, knee quad sets, guleal sets, hip adduction with ball and glute sets; use of ice for pain control, elevation for control of swelling   Consulted and Agree with Plan of Care Patient      Patient will benefit from skilled therapeutic intervention in order to improve the following deficits and impairments:  Pain, Decreased strength, Difficulty walking, Decreased range of motion, Decreased endurance, Impaired perceived functional ability, Decreased activity tolerance  Visit Diagnosis: Acute pain of right knee - Plan: PT plan of care cert/re-cert, CANCELED: PT plan of care cert/re-cert  Muscle weakness (generalized) - Plan: PT plan of care cert/re-cert, CANCELED: PT plan of care cert/re-cert  Stiffness of right knee, not elsewhere classified - Plan: PT plan of care cert/re-cert, CANCELED: PT plan of care cert/re-cert     Problem List Patient Active Problem List   Diagnosis Date Noted  . Left shoulder pain 04/24/2017  . Right knee pain 04/24/2017  . Hemorrhoids 08/02/2016  . Low back pain 05/17/2016  . Right hip pain 05/17/2016  . Chest tightness 03/01/2016  . Rash 01/31/2015  . Health care maintenance 01/31/2015  . Left knee pain 10/12/2013  . GERD (gastroesophageal reflux disease) 12/29/2012  . Abnormal Pap smear of cervix 12/29/2012  . Essential hypertension, benign 12/25/2012  . Anemia 12/25/2012  . Degeneration of lumbar or lumbosacral intervertebral disc 12/25/2012  . Hypercholesterolemia 12/25/2012  . Vitamin D deficiency 12/25/2012  . Multiple sclerosis (HCC) 12/25/2012     [name] PT 05/21/2017, 11:53 AM  [place] [facility] PHYSICAL AND SPORTS MEDICINE [address], [place], [address] Phone: [phone]   Fax:  [phone]  Name: Jackie White MRN: [id number] Date of Birth: [birth year]

## 2017-05-23 ENCOUNTER — Other Ambulatory Visit: Payer: Self-pay | Admitting: Family

## 2017-05-24 ENCOUNTER — Ambulatory Visit: Payer: BC Managed Care – PPO | Admitting: Physical Therapy

## 2017-05-24 ENCOUNTER — Other Ambulatory Visit: Payer: Self-pay | Admitting: Family

## 2017-05-24 ENCOUNTER — Encounter: Payer: Self-pay | Admitting: Physical Therapy

## 2017-05-24 DIAGNOSIS — M6281 Muscle weakness (generalized): Secondary | ICD-10-CM

## 2017-05-24 DIAGNOSIS — M25661 Stiffness of right knee, not elsewhere classified: Secondary | ICD-10-CM

## 2017-05-24 DIAGNOSIS — M25561 Pain in right knee: Secondary | ICD-10-CM

## 2017-05-24 NOTE — Therapy (Signed)
[place] [facility]ORTS [facility]ICINE [address] [place], [address] Phone: [phone]   Fax:  [phone]  Physical Therapy Treatment  Patient Details  Name: Jackie White MRN: [id number] Date of Birth: Feb 16, 1956 Referring Provider: Patsy Lager MD  Encounter Date: 05/24/2017      PT End of Session - 05/24/17 1829    Visit Number 2   Number of Visits 16   Date for PT Re-Evaluation 07/16/17   PT Start Time 1815   PT Stop Time 1850   PT Time Calculation (min) 35 min   Activity Tolerance Patient tolerated treatment well   Behavior During Therapy [facility] for tasks assessed/performed      Past Medical History:  Diagnosis Date  . Abnormal Pap smear   . Cervical disc disease    Cervical and lumbar disc disease  . GERD (gastroesophageal reflux disease)   . History of iron deficiency   . Hypertension   . Multiple sclerosis (HCC)   . Personal history of colonic polyps   . Reactive airway disease   . Vitamin D deficiency     Past Surgical History:  Procedure Laterality Date  . BREAST REDUCTION SURGERY    . REDUCTION MAMMAPLASTY Bilateral 2002  . TOTAL ABDOMINAL HYSTERECTOMY  2003   Right Oophrectomy    There were no vitals filed for this visit.      Subjective Assessment - 05/24/17 1826    Subjective Patient reports pain is 0/10 in right knee with lying or resting and has gone up to 7/10 today which is less than 8-9/10   Pertinent History May 2018 patient reports her right knee began hurting her and caused her to limp with walking, no apparent reason.    Limitations Standing;House hold activities;Walking;Sitting;Other (comment)  prolonged sitting    Diagnostic tests MRI right knee; waiting for results   Patient Stated Goals decrease pain and be able to walk with less difficulty and sit without pain   Currently in Pain? Other (Comment)  only has pain with moving, up on it      Objective: Observation right  knee: Swollen moderately with increased warmth noted, decreasaed ROM 110 degrees flexion, ambulating with antalgic gait pattern    Modalities: 20 min. Electrical stimulation: Russian stim. 10/10 cycle applied (2) electrodes to quadriceps/VMO right LE with patient reclined with LE supported on pillow; pt. Performing quad sets with each cycle; goal muscle re education High volt estim.clincial program for muscle spasms/pain  (2) electrodes applied to right knee, medial and lateral aspects, intensity to tolerance with patient in reclined position with LE supported on pillow goal: pain, spasms  Re assessed home exercises ball rolling underfoot, verbally assessed other home exercises   Patient response to treatment: Patient with decreased pain right knee with ambulation following treatment.           PT Education - 05/24/17 1829    Education provided Yes   Education Details re assessed home exercises    Person(s) Educated Patient   Methods Explanation   Comprehension Verbalized understanding             PT Long Term Goals - 05/21/17 1129      PT LONG TERM GOAL #1   Title Patient will demonstrate improved function right LE for daily activities including sitting, standing, walking with LEFS score of 60/80 by 07/16/2017   Baseline LEFS 46/80   Status New     PT LONG TERM GOAL #2  Title Patient will be independent with home program for flexibility, strengthening and pain control by 07/16/2017 to allow transition to self management once discharged from physical therapy   Baseline limited knowledge, requires cuing and guidance for appropriate exercise and performance   Status New               Plan - 05/24/17 1829    Clinical Impression Statement Focus on pain control with electrical stimulation and nueromuscular re education until MRI results are known. patient's right knee continues with increased warmth and pain with weight bearing. She is responding favorably to current  treatment.    Rehab Potential Good   Clinical Impairments Affecting Rehab Potential (+)motivated, active lifestyle prior level of function(-)comorbidities MS, prior knee pain    PT Frequency 2x / week   PT Duration 8 weeks   PT Treatment/Interventions Iontophoresis 4mg /ml Dexamethasone;Therapeutic activities;Therapeutic exercise;Moist Heat;Manual techniques;Patient/family education;Cryotherapy;Neuromuscular re-education;Ultrasound;Electrical Stimulation   PT Next Visit Plan electrical stimulation for pain control, meuromuscular re-education   PT Home Exercise Plan hip, knee quad sets, guleal sets, hip adduction with ball and glute sets; use of ice for pain control, elevation for control of swelling   Consulted and Agree with Plan of Care Patient      Patient will benefit from skilled therapeutic intervention in order to improve the following deficits and impairments:  Pain, Decreased strength, Difficulty walking, Decreased range of motion, Decreased endurance, Impaired perceived functional ability, Decreased activity tolerance  Visit Diagnosis: Acute pain of right knee  Muscle weakness (generalized)  Stiffness of right knee, not elsewhere classified     Problem List Patient Active Problem List   Diagnosis Date Noted  . Left shoulder pain 04/24/2017  . Right knee pain 04/24/2017  . Hemorrhoids 08/02/2016  . Low back pain 05/17/2016  . Right hip pain 05/17/2016  . Chest tightness 03/01/2016  . Rash 01/31/2015  . Health care maintenance 01/31/2015  . Left knee pain 10/12/2013  . GERD (gastroesophageal reflux disease) 12/29/2012  . Abnormal Pap smear of cervix 12/29/2012  . Essential hypertension, benign 12/25/2012  . Anemia 12/25/2012  . Degeneration of lumbar or lumbosacral intervertebral disc 12/25/2012  . Hypercholesterolemia 12/25/2012  . Vitamin D deficiency 12/25/2012  . Multiple sclerosis (HCC) 12/25/2012    [name] PT 05/25/2017, 10:18 AM  [facility] PHYSICAL AND SPORTS MEDICINE [address], [place], [address] Phone: [phone]   Fax:  [phone]  Name: Jackie White MRN: [id number] Date of Birth: 1956-03-14

## 2017-05-28 ENCOUNTER — Ambulatory Visit: Payer: BC Managed Care – PPO | Admitting: Physical Therapy

## 2017-05-28 ENCOUNTER — Other Ambulatory Visit: Payer: Self-pay

## 2017-05-28 ENCOUNTER — Encounter: Payer: Self-pay | Admitting: Physical Therapy

## 2017-05-28 DIAGNOSIS — M6281 Muscle weakness (generalized): Secondary | ICD-10-CM

## 2017-05-28 DIAGNOSIS — M25661 Stiffness of right knee, not elsewhere classified: Secondary | ICD-10-CM

## 2017-05-28 DIAGNOSIS — M25561 Pain in right knee: Secondary | ICD-10-CM

## 2017-05-28 MED ORDER — VALSARTAN-HYDROCHLOROTHIAZIDE 320-25 MG PO TABS: 1.0000 | ORAL_TABLET | Freq: Every day | ORAL | 0 refills | Status: DC

## 2017-05-28 MED ORDER — METOPROLOL SUCCINATE ER 25 MG PO TB24: ORAL_TABLET | ORAL | 0 refills | Status: DC

## 2017-05-29 NOTE — Therapy (Signed)
[place] [facility]TS MEDIC[facility] [address], [place], [address] Phone: [phone]   Fax:  [phone]  Physical Therapy Treatment  Patient Details  Name: Jackie White MRN: [id number] Date of Birth: 05/28/1956 Referring Provider: Patsy Lager MD  Encounter Date: 05/28/2017      PT End of Session - 05/28/17 1850    Visit Number 3   Number of Visits 16   Date for PT Re-Evaluation 07/16/17   PT Start Time 1845   PT Stop Time 1920   PT Time Calculation (min) 35 min   Activity Tolerance Patient tolerated treatment well   Behavior During Therapy [facility] for tasks assessed/performed      Past Medical History:  Diagnosis Date  . Abnormal Pap smear   . Cervical disc disease    Cervical and lumbar disc disease  . GERD (gastroesophageal reflux disease)   . History of iron deficiency   . Hypertension   . Multiple sclerosis (HCC)   . Personal history of colonic polyps   . Reactive airway disease   . Vitamin D deficiency     Past Surgical History:  Procedure Laterality Date  . BREAST REDUCTION SURGERY    . REDUCTION MAMMAPLASTY Bilateral 2002  . TOTAL ABDOMINAL HYSTERECTOMY  2003   Right Oophrectomy    There were no vitals filed for this visit.      Subjective Assessment - 05/28/17 1854    Subjective Patient reports pain level 4/10 today and gets up to 7/10 with sitting, standing.    Pertinent History May 2018 patient reports her right knee began hurting her and caused her to limp with walking, no apparent reason.    Limitations Standing;House hold activities;Walking;Sitting;Other (comment)  prolonged sitting    Diagnostic tests MRI right knee; waiting for results   Patient Stated Goals decrease pain and be able to walk with less difficulty and sit without pain   Currently in Pain? Yes   Pain Score 4    Pain Location Knee   Pain Orientation Right   Pain Descriptors / Indicators Aching   Pain Type Chronic  pain;Acute pain   Pain Onset More than a month ago        Objective: MRI results per report: right knee: lateral meniscus tear, medial collateral ligament sprain, knee effusion Observation right knee: Swollen moderately with increased warmth noted, decreasaed ROM 110 degrees flexion, ambulating with antalgic gait pattern; overall improved from previous session with less swelling and warmth  Treatment: Therapeutic exercise: patient performed with demonstration, VC and tactile cues of therapist: goal: improve LEFS, independent home program Seated quad setting, SLR performed within pain free contraction and knee extension as tolerated x 10 reps with 3 second holds Hip adduction with ball and glute sets seated and instructed to perform in supine lying as well AAROM right knee with ball underfoot roll ball x 25 reps   Modalities: 20 min. Electrical stimulation: Russian stim. 10/10 cycle applied (2) electrodes to quadriceps/VMO right LE with patient reclined with LE supported on pillow; pt. Performing quad sets with each cycle; goal muscle re education High volt estim.clincial program for muscle spasms/pain(2) electrodes applied to right knee, medial and lateral aspects,intensity to tolerance with patient in reclined position with LE supported on pillow goal: pain, spasms  Patient response to treatment: Patient demonstrated improved technique and motor control with exercises with minimal VC and assistance of therapist. Patient reported improved ability to ambulate with minimal discomfort in knee at  end of session.              PT Education - 05/28/17 1920    Education provided Yes   Education Details re assessed home program for pain control, ROM and strengthening within pain limits   Person(s) Educated Patient   Methods Explanation;Demonstration;Verbal cues   Comprehension Verbalized understanding;Returned demonstration;Verbal cues required             PT Long Term Goals  - 05/21/17 1129      PT LONG TERM GOAL #1   Title Patient will demonstrate improved function right LE for daily activities including sitting, standing, walking with LEFS score of 60/80 by 07/16/2017   Baseline LEFS 46/80   Status New     PT LONG TERM GOAL #2   Title Patient will be independent with home program for flexibility, strengthening and pain control by 07/16/2017 to allow transition to self management once discharged from physical therapy   Baseline limited knowledge, requires cuing and guidance for appropriate exercise and performance   Status New               Plan - 05/28/17 1917    Clinical Impression Statement Patient demonstrates decreased swelling and pain in right knee today. She continues with mild increased warmth and is progressing well with current POC with focus on pain control and muscle re education. Patient to speak with MD regarding MRI results and will then determine further POC.    Rehab Potential Good   Clinical Impairments Affecting Rehab Potential (+)motivated, active lifestyle prior level of function(-)comorbidities MS, prior knee pain    PT Frequency 2x / week   PT Duration 8 weeks   PT Treatment/Interventions Iontophoresis 4mg /ml Dexamethasone;Therapeutic activities;Therapeutic exercise;Moist Heat;Manual techniques;Patient/family education;Cryotherapy;Neuromuscular re-education;Ultrasound;Electrical Stimulation   PT Next Visit Plan electrical stimulation for pain control, meuromuscular re-education   PT Home Exercise Plan hip, knee quad sets, guleal sets, hip adduction with ball and glute sets; use of ice for pain control, elevation for control of swelling      Patient will benefit from skilled therapeutic intervention in order to improve the following deficits and impairments:  Pain, Decreased strength, Difficulty walking, Decreased range of motion, Decreased endurance, Impaired perceived functional ability, Decreased activity tolerance  Visit  Diagnosis: Acute pain of right knee  Muscle weakness (generalized)  Stiffness of right knee, not elsewhere classified     Problem List Patient Active Problem List   Diagnosis Date Noted  . Left shoulder pain 04/24/2017  . Right knee pain 04/24/2017  . Hemorrhoids 08/02/2016  . Low back pain 05/17/2016  . Right hip pain 05/17/2016  . Chest tightness 03/01/2016  . Rash 01/31/2015  . Health care maintenance 01/31/2015  . Left knee pain 10/12/2013  . GERD (gastroesophageal reflux disease) 12/29/2012  . Abnormal Pap smear of cervix 12/29/2012  . Essential hypertension, benign 12/25/2012  . Anemia 12/25/2012  . Degeneration of lumbar or lumbosacral intervertebral disc 12/25/2012  . Hypercholesterolemia 12/25/2012  . Vitamin D deficiency 12/25/2012  . Multiple sclerosis (HCC) 12/25/2012    [name] PT 05/29/2017, 6:40 PM  [place] [facility] PHYSICAL AND SPORTS MEDICINE [address], [place], [address] Phone: [phone]   Fax:  [phone]  Name: Jackie White MRN: [id number] Date of Birth: August 05, 1956

## 2017-05-31 ENCOUNTER — Ambulatory Visit: Payer: BC Managed Care – PPO | Admitting: Physical Therapy

## 2017-06-04 ENCOUNTER — Encounter: Payer: Self-pay | Admitting: Physical Therapy

## 2017-06-04 ENCOUNTER — Ambulatory Visit: Payer: BC Managed Care – PPO | Admitting: Physical Therapy

## 2017-06-04 DIAGNOSIS — M25661 Stiffness of right knee, not elsewhere classified: Secondary | ICD-10-CM

## 2017-06-04 DIAGNOSIS — M25561 Pain in right knee: Secondary | ICD-10-CM | POA: Diagnosis not present

## 2017-06-04 DIAGNOSIS — M6281 Muscle weakness (generalized): Secondary | ICD-10-CM

## 2017-06-04 NOTE — Therapy (Signed)
[place] [facility] PHYS[facility]ORTS MEDICINE [address] [place], [address] Phone: [phone]   Fax:  [phone]  Physical Therapy Treatment  Patient Details  Name: Jackie White MRN: [id number] Date of Birth: 1956-11-02 Referring Provider: Patsy Lager MD  Encounter Date: 06/04/2017      PT End of Session - 06/04/17 1921    Visit Number 4   Number of Visits 16   Date for PT Re-Evaluation 07/16/17   PT Start Time 1843   PT Stop Time 1915   PT Time Calculation (min) 32 min   Activity Tolerance Patient tolerated treatment well;Patient limited by pain   Behavior During Therapy [facility] for tasks assessed/performed      Past Medical History:  Diagnosis Date  . Abnormal Pap smear   . Cervical disc disease    Cervical and lumbar disc disease  . GERD (gastroesophageal reflux disease)   . History of iron deficiency   . Hypertension   . Multiple sclerosis (HCC)   . Personal history of colonic polyps   . Reactive airway disease   . Vitamin D deficiency     Past Surgical History:  Procedure Laterality Date  . BREAST REDUCTION SURGERY    . REDUCTION MAMMAPLASTY Bilateral 2002  . TOTAL ABDOMINAL HYSTERECTOMY  2003   Right Oophrectomy    There were no vitals filed for this visit.      Subjective Assessment - 06/04/17 1909    Subjective more pain today and still waiting to see MD this Friday to go over MRI results.    Pertinent History May 2018 patient reports her right knee began hurting her and caused her to limp with walking, no apparent reason.    Limitations Standing;House hold activities;Walking;Sitting;Other (comment)   Diagnostic tests MRI right knee; waiting for results   Patient Stated Goals decrease pain and be able to walk with less difficulty and sit without pain   Currently in Pain? Yes   Pain Score 8    Pain Location Knee   Pain Orientation Right   Pain Descriptors / Indicators Aching;Tightness   Pain Type  Chronic pain;Acute pain   Pain Onset More than a month ago   Pain Frequency Constant      Objective: MRI results per report: right knee: lateral meniscus tear, medial collateral ligament sprain, knee effusion Observation right knee: Swollen moderately with increased warmth noted, decreased ROM 100 degrees flexion, ambulating with antalgic gait pattern  Treatment:  Modalities: . Electrical stimulation: Russian stim. 10/10 cycle applied (2) electrodes to quadriceps/VMO right LE with patient reclined with LE supported on pillow; pt. Performing quad sets with each cycle; goal muscle re education High volt estim.clincial program for muscle spasms/pain(2) electrodes applied to right knee, medial and lateral aspects,intensity to tolerance with patient in reclined position with LE supported on pillow goal: pain, spasms  Patient response to treatment: Patient able to ambulate with decreased pain from 8/10 to 5/10  and improved ROM at end of session. Continued with moderate swelling in knee. Patient verbalized good understanding of self management of pain, swelling and exercises for home.          PT Education - 06/04/17 1920    Education provided Yes   Education Details HEP re asssessed   Person(s) Educated Patient   Methods Explanation   Comprehension Verbalized understanding             PT Long Term Goals - 05/21/17 1129  PT LONG TERM GOAL #1   Title Patient will demonstrate improved function right LE for daily activities including sitting, standing, walking with LEFS score of 60/80 by 07/16/2017   Baseline LEFS 46/80   Status New     PT LONG TERM GOAL #2   Title Patient will be independent with home program for flexibility, strengthening and pain control by 07/16/2017 to allow transition to self management once discharged from physical therapy   Baseline limited knowledge, requires cuing and guidance for appropriate exercise and performance   Status New                Plan - 06/04/17 1921    Clinical Impression Statement Patient demonstrated decreased pain from 8/10 to 5/10 with improved gait pattern following estim.  Her AROM in left knee is  limited to 100 degrees with pain limiting further motion. She will continue with physical therapy intervention for pain control until she sees MD to review MRI results to determine further treatment.    Rehab Potential Good   Clinical Impairments Affecting Rehab Potential (+)motivated, active lifestyle prior level of function(-)comorbidities MS, prior knee pain    PT Frequency 2x / week   PT Duration 8 weeks   PT Treatment/Interventions Iontophoresis 4mg /ml Dexamethasone;Therapeutic activities;Therapeutic exercise;Moist Heat;Manual techniques;Patient/family education;Cryotherapy;Neuromuscular re-education;Ultrasound;Electrical Stimulation   PT Next Visit Plan electrical stimulation for pain control, meuromuscular re-education   PT Home Exercise Plan hip, knee quad sets, guleal sets, hip adduction with ball and glute sets; use of ice for pain control, elevation for control of swelling      Patient will benefit from skilled therapeutic intervention in order to improve the following deficits and impairments:  Pain, Decreased strength, Difficulty walking, Decreased range of motion, Decreased endurance, Impaired perceived functional ability, Decreased activity tolerance  Visit Diagnosis: Acute pain of right knee  Stiffness of right knee, not elsewhere classified  Muscle weakness (generalized)     Problem List Patient Active Problem List   Diagnosis Date Noted  . Left shoulder pain 04/24/2017  . Right knee pain 04/24/2017  . Hemorrhoids 08/02/2016  . Low back pain 05/17/2016  . Right hip pain 05/17/2016  . Chest tightness 03/01/2016  . Rash 01/31/2015  . Health care maintenance 01/31/2015  . Left knee pain 10/12/2013  . GERD (gastroesophageal reflux disease) 12/29/2012  . Abnormal Pap  smear of cervix 12/29/2012  . Essential hypertension, benign 12/25/2012  . Anemia 12/25/2012  . Degeneration of lumbar or lumbosacral intervertebral disc 12/25/2012  . Hypercholesterolemia 12/25/2012  . Vitamin D deficiency 12/25/2012  . Multiple sclerosis (HCC) 12/25/2012    [name] PT 06/05/2017, 3:21 PM  [facility] PHYSICAL AND SPORTS MEDICINE [address], [place], [address] Phone: [phone]   Fax:  [phone]  Name: Jackie White MRN: [id number] Date of Birth: 1956-04-28

## 2017-06-07 ENCOUNTER — Ambulatory Visit: Payer: BC Managed Care – PPO | Admitting: Physical Therapy

## 2017-06-07 ENCOUNTER — Encounter: Payer: Self-pay | Admitting: Physical Therapy

## 2017-06-07 DIAGNOSIS — M25561 Pain in right knee: Secondary | ICD-10-CM | POA: Diagnosis not present

## 2017-06-07 DIAGNOSIS — M6281 Muscle weakness (generalized): Secondary | ICD-10-CM

## 2017-06-07 DIAGNOSIS — M25661 Stiffness of right knee, not elsewhere classified: Secondary | ICD-10-CM

## 2017-06-07 NOTE — Therapy (Signed)
[place] [facility] PH[facility] SPORTS MEDICINE [address] [place], [address] Phone: [phone]   Fax:  [phone]  Physical Therapy Treatment  Patient Details  Name: Jackie White MRN: [id number] Date of Birth: October 07, 1956 Referring Provider: Patsy Lager MD  Encounter Date: 06/07/2017      PT End of Session - 06/07/17 1830    Visit Number 5   Number of Visits 16   Date for PT Re-Evaluation 07/16/17   PT Start Time 1817   PT Stop Time 1853   PT Time Calculation (min) 36 min   Activity Tolerance Patient tolerated treatment well;Patient limited by pain   Behavior During Therapy [facility] for tasks assessed/performed      Past Medical History:  Diagnosis Date  . Abnormal Pap smear   . Cervical disc disease    Cervical and lumbar disc disease  . GERD (gastroesophageal reflux disease)   . History of iron deficiency   . Hypertension   . Multiple sclerosis (HCC)   . Personal history of colonic polyps   . Reactive airway disease   . Vitamin D deficiency     Past Surgical History:  Procedure Laterality Date  . BREAST REDUCTION SURGERY    . REDUCTION MAMMAPLASTY Bilateral 2002  . TOTAL ABDOMINAL HYSTERECTOMY  2003   Right Oophrectomy    There were no vitals filed for this visit.      Subjective Assessment - 06/07/17 1828    Subjective more pain today and still waiting to see MD this Friday to go over MRI results.    Pertinent History May 2018 patient reports her right knee began hurting her and caused her to limp with walking, no apparent reason.    Limitations Standing;House hold activities;Walking;Sitting;Other (comment)   Diagnostic tests MRI right knee; waiting for results   Patient Stated Goals decrease pain and be able to walk with less difficulty and sit without pain   Currently in Pain? Yes   Pain Score 7    Pain Location Knee   Pain Orientation Right   Pain Descriptors / Indicators Aching;Tightness   Pain Type  Chronic pain;Acute pain   Pain Onset More than a month ago   Pain Frequency Constant      Objective: MRI results per report: right knee: lateral meniscus tear, medial collateral ligament sprain, kneeeffusion Observation right knee: Swollen moderately with mild warmth noted, decreased ROM 100 degrees flexion, ambulating with antalgic gait pattern  Treatment:  Modalities: . Electrical stimulation: Russian stim. 10/10 cycle applied (2) electrodes to quadriceps/VMO right LE with patient reclined with LE supported on pillow; pt. Performing quad sets with each cycle; goal muscle re education High volt estim.clincial program for muscle spasms/pain(2) electrodes applied to right knee, medial and lateral aspects,intensity to tolerance with patient in reclined position with LE supported on pillow goal: pain, spasms  Patient response to treatment: Patient with decreased pain from 7/10 to 4/10 and able to walk with less pain in knee. Continued with swelling and pain at end of session.       PT Education - 06/07/17 1829    Education provided Yes   Education Details instructed to continue with quad sets, ROM through allowed ROM per pain response   Person(s) Educated Patient   Methods Explanation   Comprehension Verbalized understanding             PT Long Term Goals - 05/21/17 1129      PT LONG TERM GOAL #1  Title Patient will demonstrate improved function right LE for daily activities including sitting, standing, walking with LEFS score of 60/80 by 07/16/2017   Baseline LEFS 46/80   Status New     PT LONG TERM GOAL #2   Title Patient will be independent with home program for flexibility, strengthening and pain control by 07/16/2017 to allow transition to self management once discharged from physical therapy   Baseline limited knowledge, requires cuing and guidance for appropriate exercise and performance   Status New               Plan - 06/07/17 1831     Clinical Impression Statement Patient continues with significant pain in right knee that limits function and ability to perform exercises. Patient is to see MD to discuss further needs based on MRI results.    Rehab Potential Good   Clinical Impairments Affecting Rehab Potential (+)motivated, active lifestyle prior level of function(-)comorbidities MS, prior knee pain    PT Frequency 2x / week   PT Duration 8 weeks   PT Treatment/Interventions Iontophoresis 4mg /ml Dexamethasone;Therapeutic activities;Therapeutic exercise;Moist Heat;Manual techniques;Patient/family education;Cryotherapy;Neuromuscular re-education;Ultrasound;Electrical Stimulation   PT Next Visit Plan electrical stimulation for pain control, meuromuscular re-education based on follow up MD appointment    PT Home Exercise Plan hip, knee quad sets, guleal sets, hip adduction with ball and glute sets; use of ice for pain control, elevation for control of swelling      Patient will benefit from skilled therapeutic intervention in order to improve the following deficits and impairments:  Pain, Decreased strength, Difficulty walking, Decreased range of motion, Decreased endurance, Impaired perceived functional ability, Decreased activity tolerance  Visit Diagnosis: Acute pain of right knee  Stiffness of right knee, not elsewhere classified  Muscle weakness (generalized)     Problem List Patient Active Problem List   Diagnosis Date Noted  . Left shoulder pain 04/24/2017  . Right knee pain 04/24/2017  . Hemorrhoids 08/02/2016  . Low back pain 05/17/2016  . Right hip pain 05/17/2016  . Chest tightness 03/01/2016  . Rash 01/31/2015  . Health care maintenance 01/31/2015  . Left knee pain 10/12/2013  . GERD (gastroesophageal reflux disease) 12/29/2012  . Abnormal Pap smear of cervix 12/29/2012  . Essential hypertension, benign 12/25/2012  . Anemia 12/25/2012  . Degeneration of lumbar or lumbosacral intervertebral disc  12/25/2012  . Hypercholesterolemia 12/25/2012  . Vitamin D deficiency 12/25/2012  . Multiple sclerosis (HCC) 12/25/2012    [name] PT 06/08/2017, 8:40 AM  [facility] PHYSICAL AND SPORTS MEDICINE [address], [place], [address] Phone: [phone]   Fax:  [phone]  Name: Jackie White MRN: [id number] Date of Birth: 1956-07-25

## 2017-06-11 ENCOUNTER — Encounter: Payer: BC Managed Care – PPO | Admitting: Physical Therapy

## 2017-06-14 ENCOUNTER — Encounter: Payer: BC Managed Care – PPO | Admitting: Physical Therapy

## 2017-06-18 ENCOUNTER — Encounter: Payer: BC Managed Care – PPO | Admitting: Physical Therapy

## 2017-06-25 ENCOUNTER — Encounter: Payer: Self-pay | Admitting: Internal Medicine

## 2017-06-28 ENCOUNTER — Ambulatory Visit: Payer: BC Managed Care – PPO | Attending: Orthopedic Surgery | Admitting: Physical Therapy

## 2017-06-28 ENCOUNTER — Encounter: Payer: Self-pay | Admitting: Physical Therapy

## 2017-06-28 DIAGNOSIS — R262 Difficulty in walking, not elsewhere classified: Secondary | ICD-10-CM

## 2017-06-28 DIAGNOSIS — M25561 Pain in right knee: Secondary | ICD-10-CM

## 2017-06-28 DIAGNOSIS — M6281 Muscle weakness (generalized): Secondary | ICD-10-CM | POA: Diagnosis present

## 2017-06-28 DIAGNOSIS — M25661 Stiffness of right knee, not elsewhere classified: Secondary | ICD-10-CM

## 2017-06-29 NOTE — Therapy (Signed)
[place] [facility] PHYSICAL [facility] SPORTS MEDICINE [address] [place], [address] Phone: [phone]   Fax:  [phone]  Physical Therapy  Re Evaluation  Patient Details  Name: Jackie White MRN: [id number] Date of Birth: [birth year] Referring Provider: Patsy Lager MD  Encounter Date: 06/28/2017      PT End of Session - 06/28/17 1226    Visit Number 1   Number of Visits 16   Date for PT Re-Evaluation 08/23/17   PT Start Time 1033   PT Stop Time 1117   PT Time Calculation (min) 44 min   Activity Tolerance Patient tolerated treatment well;Patient limited by pain   Behavior During Therapy [facility] for tasks assessed/performed      Past Medical History:  Diagnosis Date  . Abnormal Pap smear   . Cervical disc disease    Cervical and lumbar disc disease  . GERD (gastroesophageal reflux disease)   . History of iron deficiency   . Hypertension   . Multiple sclerosis (HCC)   . Personal history of colonic polyps   . Reactive airway disease   . Vitamin D deficiency     Past Surgical History:  Procedure Laterality Date  . BREAST REDUCTION SURGERY    . REDUCTION MAMMAPLASTY Bilateral 2002  . TOTAL ABDOMINAL HYSTERECTOMY  2003   Right Oophrectomy    There were no vitals filed for this visit.       Subjective Assessment - 06/28/17 1217    Subjective Patient reports having surgery right knee 06/21/2017 with right lateral partial meniscectomy and chondroplasty femoral condyle and tibial plateau.   Pertinent History May 2018 patient reports her right knee began hurting her and caused her to limp with walking, no apparent reason.    Limitations Standing;House hold activities;Walking;Sitting;Other (comment)   Diagnostic tests MRI right knee; waiting for results   Patient Stated Goals decrease pain and be able to walk with less difficulty and sit without pain   Currently in Pain? Yes   Pain Score 7    Pain Location Knee   Pain Orientation  Right   Pain Descriptors / Indicators Aching;Tightness   Pain Type Surgical pain  06/21/2017   Pain Onset In the past 7 days   Pain Frequency Constant   Aggravating Factors  sitting, bending right knee, standing, walking   Pain Relieving Factors medication, ice, elevation   Effect of Pain on Daily Activities unable to work, perform daily chores, dress without difficulty            [facility] PT Assessment - 06/28/17 2312      Assessment   Medical Diagnosis Right knee arthroscopy, lateral partial meniscectomy and chondroplasty   Referring Provider Patsy Lager MD   Onset Date/Surgical Date 06/21/17   Hand Dominance Right   Next MD Visit --   Prior Therapy for low back pain, right hip pain 2017, right knee prior to surgery     Precautions   Precautions None     Restrictions   Weight Bearing Restrictions Yes   RLE Weight Bearing Partial weight bearing     Balance Screen   Has the patient fallen in the past 6 months No     Home Environment   Living Environment Private residence   Living Arrangements Spouse/significant other   Type of Home House   Home Access Stairs to enter   Entrance Stairs-Number of Steps 8   Entrance Stairs-Rails Right   Home Layout Two level  Prior Function   Level of Independence Independent   Vocation Full time employment   Vocation Requirements care management, sitting at computer, calls   Leisure outdoor activities     Cognition   Overall Cognitive Status Within Functional Limits for tasks assessed     Observation/Other Assessments   Lower Extremity Functional Scale  46/80     Sensation   Light Touch Appears Intact     ROM / Strength   AROM / PROM / Strength AROM;Strength     AROM   Overall AROM  Deficits;Due to pain;Other (comment)  swelling of right knee limits ROM   Overall AROM Comments decreased AROM right knee 0-75 degrees flexion with pain and swelling limting further motion     Strength   Overall Strength --   Overall  Strength Comments deferred testing right LE due to recent surgery; left LE WFL throughout major muscle groups     Palpation   Patella mobility right knee WFL all glides    Palpation comment --     Special Tests    Special Tests --   Meniscus Tests --     Ambulation/Gait   Ambulation/Gait Yes   Ambulation/Gait Assistance 7: Independent   Assistive device Crutches      Objective measurements completed on examination: See above findings.    Treatment: Modalities: Electrical stimulation: Russian stim. 10/10 cycle applied (2) electrodes to quadriceps/VMO right LE with patient reclined with LE supported on pillow; pt. Performing quad sets with each cycle; goal muscle re education High volt estim.clincial program for muscle spasms (2) electrodes applied to medial and lateral aspect of right knee intensity to tolerance with patient in reclined position with LE supported on pillow goal: pain, spasms  Instructed patient in exercises for home: quad sets, ROM exercises with ball under foot flexion/extension every hour; ice and elevation for pain, swelling  Patient response to treatment; improved ability to perform quad sets following electrical stimulation. Improved ability to perform ROM exercise with repetition        PT Education - 06/28/17 1225    Education provided Yes   Education Details exercise every hour: quad sets, SLR as tolerated, flexion with ball under foot   Person(s) Educated Patient   Methods Explanation;Demonstration;Verbal cues;Handout   Comprehension Verbalized understanding;Returned demonstration;Verbal cues required             PT Long Term Goals - 06/28/17 1130      PT LONG TERM GOAL #1   Title Patient will demonstrate improved function right LE for daily activities including sitting, standing, walking with LEFS score of 60/80    Baseline LEFS 20/80   Status New   Target Date 08/23/17     PT LONG TERM GOAL #2   Title Patient will be independent with  home program for flexibility, strengthening and pain control  to allow transition to self management once discharged from physical therapy   Baseline limited knowledge, requires cuing and guidance for appropriate exercise and performance   Status New   Target Date 08/23/17     PT LONG TERM GOAL #3   Title Patient will be independent with ambulation without AD with normal gait pattern and gait speed for improved function with community ambulation   Baseline crutches, PWB right LE   Status New   Target Date 07/24/17     PT LONG TERM GOAL #4   Title Patient will improve AROM right knee flexion to WNL with minimal to no pain to improve  transfers and stair climbing   Baseline right knee flexion 0-75   Status New   Target Date 07/30/17                Plan - 06/28/17 1227    Clinical Impression Statement Patient is s/p right knee arthroscopy lateral partial meniscectomy, chondroplasty and presents with moderate swelling and pain. She has AROM right knee flexion 0-75 degrees with pain limiting further motion. She has decreased quad control, strength and LEFS score of 20/80. she will benefit from physical therapy intervention to improve strength, ROM  to improve function.    Rehab Potential Good   Clinical Impairments Affecting Rehab Potential (+)motivated, active lifestyle prior level of function(-)comorbidities MS, prior knee pain    PT Frequency 2x / week   PT Duration 8 weeks   PT Treatment/Interventions Iontophoresis 4mg /ml Dexamethasone;Therapeutic activities;Therapeutic exercise;Moist Heat;Manual techniques;Patient/family education;Cryotherapy;Neuromuscular re-education;Ultrasound;Electrical Stimulation   PT Next Visit Plan electrical stimulation for pain control, meuromuscular re-education   PT Home Exercise Plan hip, knee quad sets, guleal sets, hip adduction with ball and glute sets; use of ice for pain control, elevation for control of swelling      Patient will benefit from  skilled therapeutic intervention in order to improve the following deficits and impairments:  Pain, Decreased strength, Difficulty walking, Decreased range of motion, Decreased endurance, Impaired perceived functional ability, Decreased activity tolerance  Visit Diagnosis: Acute pain of right knee - Plan: PT plan of care cert/re-cert  Stiffness of right knee, not elsewhere classified - Plan: PT plan of care cert/re-cert  Muscle weakness (generalized) - Plan: PT plan of care cert/re-cert  Difficulty in walking, not elsewhere classified - Plan: PT plan of care cert/re-cert     Problem List Patient Active Problem List   Diagnosis Date Noted  . Left shoulder pain 04/24/2017  . Right knee pain 04/24/2017  . Hemorrhoids 08/02/2016  . Low back pain 05/17/2016  . Right hip pain 05/17/2016  . Chest tightness 03/01/2016  . Rash 01/31/2015  . Health care maintenance 01/31/2015  . Left knee pain 10/12/2013  . GERD (gastroesophageal reflux disease) 12/29/2012  . Abnormal Pap smear of cervix 12/29/2012  . Essential hypertension, benign 12/25/2012  . Anemia 12/25/2012  . Degeneration of lumbar or lumbosacral intervertebral disc 12/25/2012  . Hypercholesterolemia 12/25/2012  . Vitamin D deficiency 12/25/2012  . Multiple sclerosis (HCC) 12/25/2012    [name] PT 06/29/2017, 6:02 PM  [facility] PHYSICAL AND SPORTS MEDICINE [address], [place], [address] Phone: [phone]   Fax:  [phone]  Name: Jackie White MRN: [id number] Date of Birth: 28-May-1956

## 2017-07-02 ENCOUNTER — Encounter: Payer: Self-pay | Admitting: Physical Therapy

## 2017-07-02 ENCOUNTER — Ambulatory Visit: Payer: BC Managed Care – PPO | Admitting: Physical Therapy

## 2017-07-02 DIAGNOSIS — R262 Difficulty in walking, not elsewhere classified: Secondary | ICD-10-CM

## 2017-07-02 DIAGNOSIS — M25561 Pain in right knee: Secondary | ICD-10-CM | POA: Diagnosis not present

## 2017-07-02 DIAGNOSIS — M6281 Muscle weakness (generalized): Secondary | ICD-10-CM

## 2017-07-02 DIAGNOSIS — M25661 Stiffness of right knee, not elsewhere classified: Secondary | ICD-10-CM

## 2017-07-02 NOTE — Therapy (Signed)
[place] [facility] [address] [place], [address] Phone: [phone]   Fax:  [phone]  Physical Therapy Treatment  Patient Details  Name: Jackie White MRN: [id number] Date of Birth: 05/11/1956 Referring Provider: Patsy Lager MD  Encounter Date: 07/02/2017      PT End of Session - 07/02/17 1435    Visit Number 2   Number of Visits 16   Date for PT Re-Evaluation 08/23/17   PT Start Time 1253   PT Stop Time 1340   PT Time Calculation (min) 47 min   Activity Tolerance Patient tolerated treatment well;Patient limited by pain   Behavior During Therapy [facility] for tasks assessed/performed      Past Medical History:  Diagnosis Date  . Abnormal Pap smear   . Cervical disc disease    Cervical and lumbar disc disease  . GERD (gastroesophageal reflux disease)   . History of iron deficiency   . Hypertension   . Multiple sclerosis (HCC)   . Personal history of colonic polyps   . Reactive airway disease   . Vitamin D deficiency     Past Surgical History:  Procedure Laterality Date  . BREAST REDUCTION SURGERY    . REDUCTION MAMMAPLASTY Bilateral 2002  . TOTAL ABDOMINAL HYSTERECTOMY  2003   Right Oophrectomy    There were no vitals filed for this visit.      Subjective Assessment - 07/02/17 1257    Subjective Patient reports continuing with right knee pain and swelling and is exercising and using ice, elevation as instructed   Pertinent History May 2018 patient reports her right knee began hurting her and caused her to limp with walking, no apparent reason.    Limitations Standing;House hold activities;Walking;Sitting;Other (comment)   Diagnostic tests MRI right knee; waiting for results   Patient Stated Goals decrease pain and be able to walk with less difficulty and sit without pain   Currently in Pain? Yes   Pain Score 5    Pain Location Knee   Pain Orientation Right   Pain Descriptors /  Indicators Aching;Tightness   Pain Type Surgical pain  06/21/2017   Pain Onset In the past 7 days   Pain Frequency Constant        Objective: Gait: ambulating with bilateral axillary crutches, PWB right LE as tolerated, patient reports walking with one crutch at home and doing okay with this AAROM: right knee sitting flexion 90 degrees Observation; right knee with moderate swelling, increased warmth to touch, steri strips over arthroscopy incision   Treatment: Modalities: Electrical stimulation: Russian stim. 10/10 cycle applied (2) electrodes to quadriceps/VMO right LE with patient reclined with LE supported on pillow; pt. Performing quad sets with each cycle; goal muscle re education High volt estim.clincial program for muscle spasms (2) electrodes applied to medial and lateral aspect of right knee intensity to tolerance with patient in reclined position with LE supported on pillow goal: pain, spasms  Therapeutic exercise: patient performed exercises with verbal, tactile cues and demonstration of therapist: Sitting: Hip adduction with glute sets x 15 reps Hip abduction with manual resistance x 10 reps with moderate resistance 5 second holds Quad sets x 10 reps with tactile cues and VC NuStep x 5 min. Level #4 with monitoring for correct hip/knee alignment and pain  Patient response to treatment; improved motor control with quad sets following electrical stimulation. Improved knee flexion with repetition and less stiffness and pain at end of session. Pain  level improved from 5/10 to 3/10 at end of session         PT Education - 07/02/17 1335    Education provided Yes   Education Details exercise instruction for proper technique and positioning   Person(s) Educated Patient   Methods Explanation;Demonstration;Verbal cues   Comprehension Verbalized understanding;Returned demonstration;Verbal cues required             PT Long Term Goals - 06/28/17 1130      PT LONG TERM  GOAL #1   Title Patient will demonstrate improved function right LE for daily activities including sitting, standing, walking with LEFS score of 60/80    Baseline LEFS 20/80   Status New   Target Date 08/23/17     PT LONG TERM GOAL #2   Title Patient will be independent with home program for flexibility, strengthening and pain control  to allow transition to self management once discharged from physical therapy   Baseline limited knowledge, requires cuing and guidance for appropriate exercise and performance   Status New   Target Date 08/23/17     PT LONG TERM GOAL #3   Title Patient will be independent with ambulation without AD with normal gait pattern and gait speed for improved function with community ambulation   Baseline crutches, PWB right LE   Status New   Target Date 07/24/17     PT LONG TERM GOAL #4   Title Patient will improve AROM right knee flexion to WNL with minimal to no pain to improve transfers and stair climbing   Baseline right knee flexion 0-75   Status New   Target Date 07/30/17               Plan - 07/02/17 1436    Clinical Impression Statement Patient demonstrated improvement with decreased pain, improved ability to walk with less difficutly and stiffness in her knee at end of session. He continues with swelling, pain and decreased strength and will benefit from additional physical therapy intervention to achieve goals.    Rehab Potential Good   Clinical Impairments Affecting Rehab Potential (+)motivated, active lifestyle prior level of function(-)comorbidities MS, prior knee pain    PT Frequency 2x / week   PT Duration 8 weeks   PT Treatment/Interventions Iontophoresis 4mg /ml Dexamethasone;Therapeutic activities;Therapeutic exercise;Moist Heat;Manual techniques;Patient/family education;Cryotherapy;Neuromuscular re-education;Ultrasound;Electrical Stimulation   PT Next Visit Plan electrical stimulation for pain control, meuromuscular re-education   PT  Home Exercise Plan hip, knee quad sets, guleal sets, hip adduction with ball and glute sets; use of ice for pain control, elevation for control of swelling      Patient will benefit from skilled therapeutic intervention in order to improve the following deficits and impairments:  Pain, Decreased strength, Difficulty walking, Decreased range of motion, Decreased endurance, Impaired perceived functional ability, Decreased activity tolerance  Visit Diagnosis: Acute pain of right knee  Stiffness of right knee, not elsewhere classified  Muscle weakness (generalized)  Difficulty in walking, not elsewhere classified     Problem List Patient Active Problem List   Diagnosis Date Noted  . Left shoulder pain 04/24/2017  . Right knee pain 04/24/2017  . Hemorrhoids 08/02/2016  . Low back pain 05/17/2016  . Right hip pain 05/17/2016  . Chest tightness 03/01/2016  . Rash 01/31/2015  . Health care maintenance 01/31/2015  . Left knee pain 10/12/2013  . GERD (gastroesophageal reflux disease) 12/29/2012  . Abnormal Pap smear of cervix 12/29/2012  . Essential hypertension, benign 12/25/2012  . Anemia 12/25/2012  .  Degeneration of lumbar or lumbosacral intervertebral disc 12/25/2012  . Hypercholesterolemia 12/25/2012  . Vitamin D deficiency 12/25/2012  . Multiple sclerosis (HCC) 12/25/2012    [name] PT 07/02/2017, 9:25 PM  [place] [facility] PHYSICAL AND SPORTS MEDICINE [address] [place], [address] Phone: [phone]   Fax:  [phone]  Name: Jackie White MRN: [id number] Date of Birth: 11-08-1956

## 2017-07-04 ENCOUNTER — Encounter: Payer: Self-pay | Admitting: Physical Therapy

## 2017-07-04 ENCOUNTER — Ambulatory Visit: Payer: BC Managed Care – PPO | Admitting: Physical Therapy

## 2017-07-04 DIAGNOSIS — M25561 Pain in right knee: Secondary | ICD-10-CM

## 2017-07-04 DIAGNOSIS — M25661 Stiffness of right knee, not elsewhere classified: Secondary | ICD-10-CM

## 2017-07-04 DIAGNOSIS — M6281 Muscle weakness (generalized): Secondary | ICD-10-CM

## 2017-07-04 DIAGNOSIS — R262 Difficulty in walking, not elsewhere classified: Secondary | ICD-10-CM

## 2017-07-04 NOTE — Therapy (Signed)
[facility] [address] [place], [address] Phone: [phone]   Fax:  [phone]  Physical Therapy Treatment  Patient Details  Name: Jackie White MRN: [id number] Date of Birth: 07-03-1956 Referring Provider: Patsy Lager MD  Encounter Date: 07/04/2017      PT End of Session - 07/04/17 1312    Visit Number 3   Number of Visits 16   Date for PT Re-Evaluation 08/23/17   PT Start Time 1300   PT Stop Time 1340   PT Time Calculation (min) 40 min   Activity Tolerance Patient tolerated treatment well;Patient limited by pain   Behavior During Therapy [facility] for tasks assessed/performed      Past Medical History:  Diagnosis Date  . Abnormal Pap smear   . Cervical disc disease    Cervical and lumbar disc disease  . GERD (gastroesophageal reflux disease)   . History of iron deficiency   . Hypertension   . Multiple sclerosis (HCC)   . Personal history of colonic polyps   . Reactive airway disease   . Vitamin D deficiency     Past Surgical History:  Procedure Laterality Date  . BREAST REDUCTION SURGERY    . REDUCTION MAMMAPLASTY Bilateral 2002  . TOTAL ABDOMINAL HYSTERECTOMY  2003   Right Oophrectomy    There were no vitals filed for this visit.      Subjective Assessment - 07/04/17 1310    Subjective increased soreness in right knee following NuStep last session; now walking with one axillary crutch.    Pertinent History May 2018 patient reports her right knee began hurting her and caused her to limp with walking, no apparent reason. S/P right knee arthroscopy 06/21/2017 for partial lateral meniscectomy and chondroplasty   Limitations Standing;House hold activities;Walking;Sitting;Other (comment)   Diagnostic tests MRI right knee   Patient Stated Goals decrease pain and be able to walk with less difficulty and sit without pain   Currently in Pain? Yes   Pain Score 5    Pain Location Knee   Pain Orientation Right   Pain Descriptors / Indicators Aching;Sore;Tightness   Pain Type Surgical pain  06/21/2017   Pain Onset 1 to 4 weeks ago       Gait: ambulating with one axillary crutch, WBAT right LE, antalgic gait pattern Observation: moderate swelling right knee, with mild warmth noted AROM: 0-95 degrees flexion ( sitting at edge of treatment table)  Treatment: Modalities: Electrical stimulation: Russian stim. 10/10 cycle applied (2) electrodes to quadriceps/VMO right LE with patient reclined with LE supported on pillow; pt. Performing quad sets with each cycle; goal muscle re education High volt estim.clincial program for muscle spasms (2) electrodes applied to medial and lateral aspect of right kneeintensity to tolerance with patient in reclined position with LE supported on pillow goal: pain, spasms Ice pack applied to right knee in conjunction with estim. (unbilled time) no adverse reactions noted: goal: pain  Therapeutic exercise: patient performed exercises with verbal, tactile cues and demonstration of therapist: goal: independent with home program, improve LEFS, strength Sitting: Hip adduction with glute sets x 15 reps Hip abduction with manual resistance x 10 reps with moderate resistance 5 second holds Quad sets x 10 reps with tactile cues and VC Supine lying: Assisted SLR 2 x 5 reps (moderate assistance required) Assisted hip abduction and adduction supine 2 x 5 reps (moderate assistance required) Hip/knee flexion/extension with assist of therapist 3 sets of 3-5 reps with  isometric hold end ranges for knee flexion, hip extension  Patient response to treatment; Patient required moderate assistance to perform exercises in supine lying, improved motor control following estim. Pain level improved from 5/10 to 3/10 at end of session.            PT Education - 07/04/17 1312    Education provided Yes   Education Details instruction in exercises for proper  technique, alignment and intensity   Person(s) Educated Patient   Methods Explanation;Demonstration;Verbal cues   Comprehension Verbalized understanding;Returned demonstration;Verbal cues required             PT Long Term Goals - 06/28/17 1130      PT LONG TERM GOAL #1   Title Patient will demonstrate improved function right LE for daily activities including sitting, standing, walking with LEFS score of 60/80    Baseline LEFS 20/80   Status New   Target Date 08/23/17     PT LONG TERM GOAL #2   Title Patient will be independent with home program for flexibility, strengthening and pain control  to allow transition to self management once discharged from physical therapy   Baseline limited knowledge, requires cuing and guidance for appropriate exercise and performance   Status New   Target Date 08/23/17     PT LONG TERM GOAL #3   Title Patient will be independent with ambulation without AD with normal gait pattern and gait speed for improved function with community ambulation   Baseline crutches, PWB right LE   Status New   Target Date 07/24/17     PT LONG TERM GOAL #4   Title Patient will improve AROM right knee flexion to WNL with minimal to no pain to improve transfers and stair climbing   Baseline right knee flexion 0-75   Status New   Target Date 07/30/17               Plan - 07/04/17 1312    Clinical Impression Statement Patient able to perform exercises with moderate assistance. She continues with weakness and is progressing steadily s/p knee arthroscopy.    Rehab Potential Good   Clinical Impairments Affecting Rehab Potential (+)motivated, active lifestyle prior level of function(-)comorbidities MS, prior knee pain    PT Frequency 2x / week   PT Duration 8 weeks   PT Treatment/Interventions Iontophoresis 4mg /ml Dexamethasone;Therapeutic activities;Therapeutic exercise;Moist Heat;Manual techniques;Patient/family education;Cryotherapy;Neuromuscular  re-education;Ultrasound;Electrical Stimulation   PT Next Visit Plan electrical stimulation for pain control, meuromuscular re-education   PT Home Exercise Plan hip, knee quad sets, guleal sets, hip adduction with ball and glute sets; use of ice for pain control, elevation for control of swelling      Patient will benefit from skilled therapeutic intervention in order to improve the following deficits and impairments:  Pain, Decreased strength, Difficulty walking, Decreased range of motion, Decreased endurance, Impaired perceived functional ability, Decreased activity tolerance  Visit Diagnosis: Acute pain of right knee  Stiffness of right knee, not elsewhere classified  Muscle weakness (generalized)  Difficulty in walking, not elsewhere classified     Problem List Patient Active Problem List   Diagnosis Date Noted  . Left shoulder pain 04/24/2017  . Right knee pain 04/24/2017  . Hemorrhoids 08/02/2016  . Low back pain 05/17/2016  . Right hip pain 05/17/2016  . Chest tightness 03/01/2016  . Rash 01/31/2015  . Health care maintenance 01/31/2015  . Left knee pain 10/12/2013  . GERD (gastroesophageal reflux disease) 12/29/2012  . Abnormal Pap smear of  cervix 12/29/2012  . Essential hypertension, benign 12/25/2012  . Anemia 12/25/2012  . Degeneration of lumbar or lumbosacral intervertebral disc 12/25/2012  . Hypercholesterolemia 12/25/2012  . Vitamin D deficiency 12/25/2012  . Multiple sclerosis (HCC) 12/25/2012    [name] PT 07/05/2017, 5:44 PM  [place] [facility] PHYSICAL AND SPORTS MEDICINE [address] [place], [address] Phone: [phone]   Fax:  [phone]  Name: Jackie White MRN: [id number] Date of Birth: [birth year]

## 2017-07-09 ENCOUNTER — Encounter: Payer: Self-pay | Admitting: Physical Therapy

## 2017-07-09 ENCOUNTER — Ambulatory Visit: Payer: BC Managed Care – PPO | Admitting: Physical Therapy

## 2017-07-09 DIAGNOSIS — M6281 Muscle weakness (generalized): Secondary | ICD-10-CM

## 2017-07-09 DIAGNOSIS — R262 Difficulty in walking, not elsewhere classified: Secondary | ICD-10-CM

## 2017-07-09 DIAGNOSIS — M25561 Pain in right knee: Secondary | ICD-10-CM

## 2017-07-09 DIAGNOSIS — M25661 Stiffness of right knee, not elsewhere classified: Secondary | ICD-10-CM

## 2017-07-09 NOTE — Therapy (Signed)
[place] [facility] [address], [place], [address] Phone: [phone]   Fax:  [phone]  Physical Therapy Treatment  Patient Details  Name: Jackie White MRN: [id number] Date of Birth: 15-Apr-1956 Referring Provider: Patsy Lager MD  Encounter Date: 07/09/2017      PT End of Session - 07/09/17 1032    Visit Number 4   Number of Visits 16   Date for PT Re-Evaluation 08/23/17   PT Start Time 0945   PT Stop Time 1035   PT Time Calculation (min) 50 min   Activity Tolerance Patient tolerated treatment well;Patient limited by pain   Behavior During Therapy [facility] for tasks assessed/performed      Past Medical History:  Diagnosis Date  . Abnormal Pap smear   . Cervical disc disease    Cervical and lumbar disc disease  . GERD (gastroesophageal reflux disease)   . History of iron deficiency   . Hypertension   . Multiple sclerosis (HCC)   . Personal history of colonic polyps   . Reactive airway disease   . Vitamin D deficiency     Past Surgical History:  Procedure Laterality Date  . BREAST REDUCTION SURGERY    . REDUCTION MAMMAPLASTY Bilateral 2002  . TOTAL ABDOMINAL HYSTERECTOMY  2003   Right Oophrectomy    There were no vitals filed for this visit.      Subjective Assessment - 07/09/17 0949    Subjective Patient is beginning to drive and is having mild pain.    Pertinent History May 2018 patient reports her right knee began hurting her and caused her to limp with walking, no apparent reason. S/P right knee arthroscopy 06/21/2017 for partial lateral meniscectomy and chondroplasty   Limitations Standing;House hold activities;Walking;Sitting;Other (comment)   Diagnostic tests MRI right knee   Patient Stated Goals decrease pain and be able to walk with less difficulty and sit without pain   Currently in Pain? Yes   Pain Score 5    Pain Location Knee   Pain Orientation Right   Pain Descriptors /  Indicators Aching;Sore   Pain Type Surgical pain  06/21/2017   Pain Onset 1 to 4 weeks ago   Pain Frequency Constant        Gait: ambulating with one axillary crutch, WBAT right LE, mild limp on right, decreased weight shift to right Observation: moderate swelling right knee, with mild warmth noted, decreased as compared to previous session AROM: 0-95/97 degrees flexion ( sitting at edge of treatment table)  Treatment: Modalities: performed at end of session:  Electrical stimulation: 20 min Russian stim. 10/10 cycle applied (2) electrodes to quadriceps/VMO right LE with patient reclined with LE supported on pillow; pt. Performing quad sets with each cycle; goal muscle re education High volt estim.clincial program for muscle spasms (2) electrodes applied to medial and lateral aspect of right kneeintensity to tolerance with patient in reclined position with LE supported on pillow goal: pain, spasms Ice pack applied to right knee in conjunction with estim. (unbilled time) no adverse reactions noted: goal: pain  Therapeutic exercise: patient performed exercises with verbal, tactile cues and demonstration of therapist: goal: independent with home program, improve LEFS, strength Sitting: Patella mobilization performed with patella mobilizer prior to exercise: 3 reps distraction with glides all directions Hip adduction with glute sets x 15 reps Hip abduction with manual resistance x 10 reps with moderate resistance 5 second holds Roll 6# ball under right foot x 2  min. With good controlled motion MAI knee flexion right with moderate manual resistance given x 3 sets, 30/60/90+ degrees, 5 second holds  Nustep x 5 min. For ROM, endurance (unbilled time) level #4 resistance  Patient response to treatment; Patient demonstrated decreased warmth and pain at end of session following exercise/estim. She was unable to perform SLR independently and she demonstrated improved flexibility in right knee  flexion following ice/stim. Treatment.  Pain level improved from 5/10 to < 3/10 at end of session.         PT Education - 07/09/17 1033    Education provided Yes   Education Details exercise instruction and modificaiton to avoid increased pain in knee   Person(s) Educated Patient   Methods Explanation   Comprehension Verbalized understanding             PT Long Term Goals - 06/28/17 1130      PT LONG TERM GOAL #1   Title Patient will demonstrate improved function right LE for daily activities including sitting, standing, walking with LEFS score of 60/80    Baseline LEFS 20/80   Status New   Target Date 08/23/17     PT LONG TERM GOAL #2   Title Patient will be independent with home program for flexibility, strengthening and pain control  to allow transition to self management once discharged from physical therapy   Baseline limited knowledge, requires cuing and guidance for appropriate exercise and performance   Status New   Target Date 08/23/17     PT LONG TERM GOAL #3   Title Patient will be independent with ambulation without AD with normal gait pattern and gait speed for improved function with community ambulation   Baseline crutches, PWB right LE   Status New   Target Date 07/24/17     PT LONG TERM GOAL #4   Title Patient will improve AROM right knee flexion to WNL with minimal to no pain to improve transfers and stair climbing   Baseline right knee flexion 0-75   Status New   Target Date 07/30/17               Plan - 07/09/17 1030    Clinical Impression Statement Patient demonstrated improved motor control and strength right LE with exercises. Patient demonstrates improved gait pattern and is now able to increased weight bearing through right LE with minimal discomfort. She is progressing steadily towards all goals and should continue to improve with additional physical therapy intervention.    Rehab Potential Good   Clinical Impairments Affecting Rehab  Potential (+)motivated, active lifestyle prior level of function(-)comorbidities MS, prior knee pain    PT Frequency 2x / week   PT Duration 8 weeks   PT Treatment/Interventions Iontophoresis 4mg /ml Dexamethasone;Therapeutic activities;Therapeutic exercise;Moist Heat;Manual techniques;Patient/family education;Cryotherapy;Neuromuscular re-education;Ultrasound;Electrical Stimulation   PT Next Visit Plan electrical stimulation for pain control, meuromuscular re-education   PT Home Exercise Plan hip, knee quad sets, guleal sets, hip adduction with ball and glute sets; use of ice for pain control, elevation for control of swelling      Patient will benefit from skilled therapeutic intervention in order to improve the following deficits and impairments:  Pain, Decreased strength, Difficulty walking, Decreased range of motion, Decreased endurance, Impaired perceived functional ability, Decreased activity tolerance  Visit Diagnosis: Acute pain of right knee  Stiffness of right knee, not elsewhere classified  Muscle weakness (generalized)  Difficulty in walking, not elsewhere classified     Problem List Patient Active Problem List  Diagnosis Date Noted  . Left shoulder pain 04/24/2017  . Right knee pain 04/24/2017  . Hemorrhoids 08/02/2016  . Low back pain 05/17/2016  . Right hip pain 05/17/2016  . Chest tightness 03/01/2016  . Rash 01/31/2015  . Health care maintenance 01/31/2015  . Left knee pain 10/12/2013  . GERD (gastroesophageal reflux disease) 12/29/2012  . Abnormal Pap smear of cervix 12/29/2012  . Essential hypertension, benign 12/25/2012  . Anemia 12/25/2012  . Degeneration of lumbar or lumbosacral intervertebral disc 12/25/2012  . Hypercholesterolemia 12/25/2012  . Vitamin D deficiency 12/25/2012  . Multiple sclerosis (HCC) 12/25/2012    [name] PT 07/09/2017, 5:42 PM  [place] [facility] PHYSICAL AND SPORTS MEDICINE [address], [place], [address] Phone: [phone]   Fax:  [phone]  Name: Jackie White MRN: [id number] Date of Birth: 1955-11-23

## 2017-07-10 ENCOUNTER — Ambulatory Visit: Payer: BC Managed Care – PPO | Admitting: Physical Therapy

## 2017-07-11 ENCOUNTER — Ambulatory Visit: Payer: BC Managed Care – PPO | Admitting: Physical Therapy

## 2017-07-11 ENCOUNTER — Encounter: Payer: Self-pay | Admitting: Physical Therapy

## 2017-07-11 DIAGNOSIS — R262 Difficulty in walking, not elsewhere classified: Secondary | ICD-10-CM

## 2017-07-11 DIAGNOSIS — M6281 Muscle weakness (generalized): Secondary | ICD-10-CM

## 2017-07-11 DIAGNOSIS — M25561 Pain in right knee: Secondary | ICD-10-CM

## 2017-07-11 DIAGNOSIS — M25661 Stiffness of right knee, not elsewhere classified: Secondary | ICD-10-CM

## 2017-07-12 NOTE — Therapy (Signed)
[place] [facility]ICINE [address] [place], [address] Phone: [phone]   Fax:  [phone]  Physical Therapy Treatment  Patient Details  Name: Jackie White MRN: [id number] Date of Birth: 30-Nov-1955 Referring Provider: Patsy Lager MD  Encounter Date: 07/11/2017      PT End of Session - 07/11/17 0945    Visit Number 5   Number of Visits 16   Date for PT Re-Evaluation 08/23/17   PT Start Time 0803   PT Stop Time 0850   PT Time Calculation (min) 47 min   Activity Tolerance Patient tolerated treatment well;Patient limited by pain   Behavior During Therapy [facility] for tasks assessed/performed      Past Medical History:  Diagnosis Date  . Abnormal Pap smear   . Cervical disc disease    Cervical and lumbar disc disease  . GERD (gastroesophageal reflux disease)   . History of iron deficiency   . Hypertension   . Multiple sclerosis (HCC)   . Personal history of colonic polyps   . Reactive airway disease   . Vitamin D deficiency     Past Surgical History:  Procedure Laterality Date  . BREAST REDUCTION SURGERY    . REDUCTION MAMMAPLASTY Bilateral 2002  . TOTAL ABDOMINAL HYSTERECTOMY  2003   Right Oophrectomy    There were no vitals filed for this visit.      Subjective Assessment - 07/11/17 6812    Subjective Patient reports she went out yesterday to an appointment and had to do quite a bit of walking, ~1/2 mile and she had to take breaks and then used a wheelchair. Her knee swelled up and she rested. Today it feels some better, still sore.   Pertinent History May 2018 patient reports her right knee began hurting her and caused her to limp with walking, no apparent reason. S/P right knee arthroscopy 06/21/2017 for partial lateral meniscectomy and chondroplasty   Limitations Standing;House hold activities;Walking;Sitting;Other (comment)   Diagnostic tests MRI right knee   Patient Stated Goals decrease pain  and be able to walk with less difficulty and sit without pain   Currently in Pain? Yes   Pain Score 7    Pain Location Knee   Pain Orientation Right   Pain Descriptors / Indicators Aching;Tightness;Sore   Pain Type Surgical pain  06/21/2017   Pain Onset 1 to 4 weeks ago   Pain Frequency Constant      Gait: ambulating with one axillary crutch, WBAT right LE, mild limp on right, decreased weight shift to right Observation: moderate swelling right knee, with mild warmth noted, decreased as compared to previous session  Treatment: Modalities: performed at end of session:  Electrical stimulation: 20 min Russian stim. 10/10 cycle applied (2) electrodes to quadriceps/VMO right LE with patient reclined with LE supported on pillow; pt. Performing quad sets with each cycle; goal muscle re education High volt estim.clincial program for muscle spasms (2) electrodes applied to medial and lateral aspect of right kneeintensity to tolerance with patient in reclined position with LE supported on pillow goal: pain, spasms Ice pack applied to right knee in conjunction with estim. (unbilled time) no adverse reactions noted: goal: pain  Therapeutic exercise:patient performed exercises with verbal, tactile cues and demonstration of therapist: goal: independent with home program, improve LEFS, strength Sitting: Patella mobilization performed with patella mobilizer prior to exercise: 3 reps distraction with glides all directions Hip adduction with glute sets x 15 reps Hip abduction  with manual resistance x 10 reps with moderate resistance 5 second holds SLR supine lying with assistance of therapist 2 x 5 reps Hip abduction/adduction supine with assistance of therapist 2 x 5 reps Hip/knee flexion/extension AAROM with assist of therapist supine lying with STM light effleurage performed to hamstring and quadriceps to decrease pain and improve ROM  Patient response to treatment; Patient with decreased right  knee pain and decreased warmth at end of session. Pain level improved form 7/10 to 3/10.         PT Education - 07/11/17 0810    Education provided Yes   Education Details exercise instruction and pain control   Person(s) Educated Patient   Methods Explanation;Demonstration;Verbal cues   Comprehension Verbalized understanding;Returned demonstration;Verbal cues required             PT Long Term Goals - 06/28/17 1130      PT LONG TERM GOAL #1   Title Patient will demonstrate improved function right LE for daily activities including sitting, standing, walking with LEFS score of 60/80    Baseline LEFS 20/80   Status New   Target Date 08/23/17     PT LONG TERM GOAL #2   Title Patient will be independent with home program for flexibility, strengthening and pain control  to allow transition to self management once discharged from physical therapy   Baseline limited knowledge, requires cuing and guidance for appropriate exercise and performance   Status New   Target Date 08/23/17     PT LONG TERM GOAL #3   Title Patient will be independent with ambulation without AD with normal gait pattern and gait speed for improved function with community ambulation   Baseline crutches, PWB right LE   Status New   Target Date 07/24/17     PT LONG TERM GOAL #4   Title Patient will improve AROM right knee flexion to WNL with minimal to no pain to improve transfers and stair climbing   Baseline right knee flexion 0-75   Status New   Target Date 07/30/17               Plan - 07/11/17 0853    Clinical Impression Statement Patient demonstrated improved motor control and strength in right LE with exercises. Her AROM is improving and she is able to perform SLR with less difficulty. She continues with significant weakness and decreased abiltiy to ambulate without AD and will require continued physical therapy intervention to achieve goals.    Rehab Potential Good   Clinical Impairments  Affecting Rehab Potential (+)motivated, active lifestyle prior level of function(-)comorbidities MS, prior knee pain    PT Frequency 2x / week   PT Duration 8 weeks   PT Treatment/Interventions Iontophoresis 4mg /ml Dexamethasone;Therapeutic activities;Therapeutic exercise;Moist Heat;Manual techniques;Patient/family education;Cryotherapy;Neuromuscular re-education;Ultrasound;Electrical Stimulation   PT Next Visit Plan electrical stimulation for pain control, meuromuscular re-education   PT Home Exercise Plan hip, knee quad sets, guleal sets, hip adduction with ball and glute sets; use of ice for pain control, elevation for control of swelling      Patient will benefit from skilled therapeutic intervention in order to improve the following deficits and impairments:  Pain, Decreased strength, Difficulty walking, Decreased range of motion, Decreased endurance, Impaired perceived functional ability, Decreased activity tolerance  Visit Diagnosis: Acute pain of right knee  Stiffness of right knee, not elsewhere classified  Muscle weakness (generalized)  Difficulty in walking, not elsewhere classified     Problem List Patient Active Problem List  Diagnosis Date Noted  . Left shoulder pain 04/24/2017  . Right knee pain 04/24/2017  . Hemorrhoids 08/02/2016  . Low back pain 05/17/2016  . Right hip pain 05/17/2016  . Chest tightness 03/01/2016  . Rash 01/31/2015  . Health care maintenance 01/31/2015  . Left knee pain 10/12/2013  . GERD (gastroesophageal reflux disease) 12/29/2012  . Abnormal Pap smear of cervix 12/29/2012  . Essential hypertension, benign 12/25/2012  . Anemia 12/25/2012  . Degeneration of lumbar or lumbosacral intervertebral disc 12/25/2012  . Hypercholesterolemia 12/25/2012  . Vitamin D deficiency 12/25/2012  . Multiple sclerosis (HCC) 12/25/2012    [name] PT 07/12/2017, 10:22 AM  [facility] PHYSICAL AND SPORTS  MEDICINE [address], [place], [address] Phone: [phone]   Fax:  [phone]  Name: Jackie White MRN: [id number] Date of Birth: 21-Jun-1956

## 2017-07-17 ENCOUNTER — Encounter: Payer: Self-pay | Admitting: Physical Therapy

## 2017-07-17 ENCOUNTER — Ambulatory Visit: Payer: BC Managed Care – PPO | Admitting: Physical Therapy

## 2017-07-17 DIAGNOSIS — M6281 Muscle weakness (generalized): Secondary | ICD-10-CM

## 2017-07-17 DIAGNOSIS — R262 Difficulty in walking, not elsewhere classified: Secondary | ICD-10-CM

## 2017-07-17 DIAGNOSIS — M25661 Stiffness of right knee, not elsewhere classified: Secondary | ICD-10-CM

## 2017-07-17 DIAGNOSIS — M25561 Pain in right knee: Secondary | ICD-10-CM | POA: Diagnosis not present

## 2017-07-17 NOTE — Therapy (Signed)
[facility] [address], [place], [address] Phone: [phone]   Fax:  [phone]  Physical Therapy Treatment  Patient Details  Name: Jackie White MRN: [id number] Date of Birth: [birth year] Referring Provider: Patsy Lager MD  Encounter Date: 07/17/2017      PT End of Session - 07/17/17 1856    Visit Number 6   Number of Visits 16   Date for PT Re-Evaluation 08/23/17   PT Start Time 1756   PT Stop Time 1850   PT Time Calculation (min) 54 min   Activity Tolerance Patient tolerated treatment well;Patient limited by pain   Behavior During Therapy [facility] for tasks assessed/performed      Past Medical History:  Diagnosis Date  . Abnormal Pap smear   . Cervical disc disease    Cervical and lumbar disc disease  . GERD (gastroesophageal reflux disease)   . History of iron deficiency   . Hypertension   . Multiple sclerosis (HCC)   . Personal history of colonic polyps   . Reactive airway disease   . Vitamin D deficiency     Past Surgical History:  Procedure Laterality Date  . BREAST REDUCTION SURGERY    . REDUCTION MAMMAPLASTY Bilateral 2002  . TOTAL ABDOMINAL HYSTERECTOMY  2003   Right Oophrectomy    There were no vitals filed for this visit.      Subjective Assessment - 07/17/17 1800    Subjective Patient started back to work this week and is using one axillary crutch for support. She has 3-4/10 pain level today.   Pertinent History May 2018 patient reports her right knee began hurting her and caused her to limp with walking, no apparent reason. S/P right knee arthroscopy 06/21/2017 for partial lateral meniscectomy and chondroplasty   Limitations Standing;House hold activities;Walking;Sitting;Other (comment)   Diagnostic tests MRI right knee   Patient Stated Goals decrease pain and be able to walk with less difficulty and sit without pain   Currently in Pain? Yes   Pain Score 4    Pain  Location Knee   Pain Orientation Right   Pain Descriptors / Indicators Aching;Tightness;Sore   Pain Type Surgical pain  06/21/17   Pain Onset 1 to 4 weeks ago   Pain Frequency Constant     Objective: Gait: ambulating with one axillary crutch, WBAT right LE, mild limp on right, decreased weight shift to right Observation: mild to moderate swelling right knee,  mild warmth noted, decreased as compared to previous session AAROM: right knee flexion sititng at edge of treatment table: 0-105 degrees   Treatment: Modalities: performed at end of session:  Electrical stimulation: 20 minRussian stim. 10/10 cycle applied (2) electrodes to quadriceps/VMO right LE with patient reclined with LE supported on pillow; pt. Performing quad sets with each cycle; goal muscle re education High volt estim.clincial program for muscle spasms (2) electrodes applied to medial and lateral aspect of right kneeintensity to tolerance with patient in reclined position with LE supported on pillow goal: pain, spasms  Manual therapy: 8 min. Goal: pain, spasms, improve ROM Patella mobilization performed with patella mobilizer prior to exercise: 3 reps distraction with glides all directions STM performed to right LE quadriceps central and lateral with superficial techniques with patient supine lying with right LE supported on pillow  Therapeutic exercise:patient performed exercises with verbal, tactile cues and demonstration of therapist: goal: independent with home program, improve LEFS, strength Supine lying: Hip abduction with manual resistance  x 10 reps with moderate resistance 5 second holds SLR supine lying with assistance of therapist 2 x 5 reps Hip/knee flexion/extension AAROM with assist of therapist supine lying x 10 reps  Patient response to treatment; required assistance to perform exercises for right LE. Improved soft tissue elasticity with decreased tenderness in quadriceps right LE following STM.  Improved motor control with repetition.       PT Education - 07/17/17 1830    Education provided Yes   Education Details exercise instruction/technique   Person(s) Educated Patient   Methods Explanation;Demonstration;Verbal cues   Comprehension Verbalized understanding;Returned demonstration;Verbal cues required             PT Long Term Goals - 06/28/17 1130      PT LONG TERM GOAL #1   Title Patient will demonstrate improved function right LE for daily activities including sitting, standing, walking with LEFS score of 60/80    Baseline LEFS 20/80   Status New   Target Date 08/23/17     PT LONG TERM GOAL #2   Title Patient will be independent with home program for flexibility, strengthening and pain control  to allow transition to self management once discharged from physical therapy   Baseline limited knowledge, requires cuing and guidance for appropriate exercise and performance   Status New   Target Date 08/23/17     PT LONG TERM GOAL #3   Title Patient will be independent with ambulation without AD with normal gait pattern and gait speed for improved function with community ambulation   Baseline crutches, PWB right LE   Status New   Target Date 07/24/17     PT LONG TERM GOAL #4   Title Patient will improve AROM right knee flexion to WNL with minimal to no pain to improve transfers and stair climbing   Baseline right knee flexion 0-75   Status New   Target Date 07/30/17               Plan - 07/17/17 1900    Clinical Impression Statement Patient demonstrates steady improvement with goals with AAROM to 105 degrees knee flexion and now able to perform exercises with less assistance of therapist. she continues with dignificant weakness and limitations with ambulations and will require additioanl physical therapy intervention to achieve goals.    Rehab Potential Good   Clinical Impairments Affecting Rehab Potential (+)motivated, active lifestyle prior level of  function(-)comorbidities MS, prior knee pain    PT Frequency 2x / week   PT Duration 8 weeks   PT Treatment/Interventions Iontophoresis 4mg /ml Dexamethasone;Therapeutic activities;Therapeutic exercise;Moist Heat;Manual techniques;Patient/family education;Cryotherapy;Neuromuscular re-education;Ultrasound;Electrical Stimulation   PT Next Visit Plan electrical stimulation for pain control, meuromuscular re-education   PT Home Exercise Plan hip, knee quad sets, guleal sets, hip adduction with ball and glute sets; use of ice for pain control, elevation for control of swelling      Patient will benefit from skilled therapeutic intervention in order to improve the following deficits and impairments:  Pain, Decreased strength, Difficulty walking, Decreased range of motion, Decreased endurance, Impaired perceived functional ability, Decreased activity tolerance  Visit Diagnosis: Acute pain of right knee  Stiffness of right knee, not elsewhere classified  Muscle weakness (generalized)  Difficulty in walking, not elsewhere classified     Problem List Patient Active Problem List   Diagnosis Date Noted  . Left shoulder pain 04/24/2017  . Right knee pain 04/24/2017  . Hemorrhoids 08/02/2016  . Low back pain 05/17/2016  . Right hip pain  05/17/2016  . Chest tightness 03/01/2016  . Rash 01/31/2015  . Health care maintenance 01/31/2015  . Left knee pain 10/12/2013  . GERD (gastroesophageal reflux disease) 12/29/2012  . Abnormal Pap smear of cervix 12/29/2012  . Essential hypertension, benign 12/25/2012  . Anemia 12/25/2012  . Degeneration of lumbar or lumbosacral intervertebral disc 12/25/2012  . Hypercholesterolemia 12/25/2012  . Vitamin D deficiency 12/25/2012  . Multiple sclerosis (HCC) 12/25/2012    [name] PT 07/18/2017, 1:23 PM  [facility] PHYSICAL AND SPORTS MEDICINE [address] [place], [address] Phone: [phone]   Fax:   [phone]  Name: Jackie White MRN: [id number] Date of Birth: 10-19-1956

## 2017-07-19 ENCOUNTER — Ambulatory Visit: Payer: BC Managed Care – PPO | Admitting: Physical Therapy

## 2017-07-19 ENCOUNTER — Encounter: Payer: Self-pay | Admitting: Physical Therapy

## 2017-07-19 DIAGNOSIS — R262 Difficulty in walking, not elsewhere classified: Secondary | ICD-10-CM

## 2017-07-19 DIAGNOSIS — M25561 Pain in right knee: Secondary | ICD-10-CM | POA: Diagnosis not present

## 2017-07-19 DIAGNOSIS — M6281 Muscle weakness (generalized): Secondary | ICD-10-CM

## 2017-07-19 DIAGNOSIS — M25661 Stiffness of right knee, not elsewhere classified: Secondary | ICD-10-CM

## 2017-07-20 NOTE — Therapy (Signed)
[facility] PH[facility]TS MEDICINE [address] [place], [address] Phone: [phone]   Fax:  [phone]  Physical Therapy Treatment  Patient Details  Name: Jackie White MRN: [id number] Date of Birth: 06/03/1956 Referring Provider: Patsy Lager MD  Encounter Date: 07/19/2017      PT End of Session - 07/19/17 1400    Visit Number 7   Number of Visits 16   Date for PT Re-Evaluation 08/23/17   PT Start Time 1215   PT Stop Time 1300   PT Time Calculation (min) 45 min   Activity Tolerance Patient tolerated treatment well;Patient limited by pain   Behavior During Therapy [facility] for tasks assessed/performed      Past Medical History:  Diagnosis Date  . Abnormal Pap smear   . Cervical disc disease    Cervical and lumbar disc disease  . GERD (gastroesophageal reflux disease)   . History of iron deficiency   . Hypertension   . Multiple sclerosis (HCC)   . Personal history of colonic polyps   . Reactive airway disease   . Vitamin D deficiency     Past Surgical History:  Procedure Laterality Date  . BREAST REDUCTION SURGERY    . REDUCTION MAMMAPLASTY Bilateral 2002  . TOTAL ABDOMINAL HYSTERECTOMY  2003   Right Oophrectomy    There were no vitals filed for this visit.      Subjective Assessment - 07/19/17 1217    Subjective patient reports having increased swelling in entire leg beginning yesterday.    Pertinent History May 2018 patient reports her right knee began hurting her and caused her to limp with walking, no apparent reason. S/P right knee arthroscopy 06/21/2017 for partial lateral meniscectomy and chondroplasty   Limitations Standing;House hold activities;Walking;Sitting;Other (comment)   Diagnostic tests MRI right knee   Patient Stated Goals decrease pain and be able to walk with less difficulty and sit without pain   Currently in Pain? Yes   Pain Score 4    Pain Location Knee   Pain Orientation Right    Pain Descriptors / Indicators Aching;Tightness   Pain Type Surgical pain  06/21/2017   Pain Onset 1 to 4 weeks ago   Pain Frequency Constant      Objective: Gait: ambulating with one axillary crutch, WBAT right LE, mild limp on right, decreased weight shift to right Observation: mild to moderate swelling right knee,  mild warmth noted, decreased as compared to previous session AAROM: right knee flexion sititng at edge of treatment table: 0-105 degrees   Treatment: Modalities: performed at end of session:  Electrical stimulation: 20 minRussian stim. 10/10 cycle applied (2) electrodes to quadriceps/VMO right LE with patient reclined with LE supported on pillow; pt. Performing quad sets with each cycle; goal muscle re education High volt estim.clincial program for muscle spasms (2) electrodes applied to medial and lateral aspect of right kneeintensity to tolerance with patient in reclined position with LE supported on pillow goal: pain, spasms  Therapeutic exercise:patient performed exercises with verbal, tactile cues and demonstration of therapist: goal: independent with home program, improve LEFS, strength Supine lying: Hip abduction with manual resistance x 10 reps with moderate resistance 5 second holds SLR supine lying with assistance of therapist 3 x 5 reps Hip abduction/adduction with assistance of therapist 3 x 5 reps Hip/knee flexion/extension AAROM with assist of therapist supine lying x 10 reps Hook lying bridge with ball squeeze x 10 reps  Patient response to treatment; required  moderate assistance to perform exercises with good technique and through partial ROM due to weakness. Improved quadriceps control following estim. Improved ambulation with less difficulty and pain at end of session.        PT Education - 07/19/17 1330    Education provided Yes   Education Details swelling control with elevation, support socks, exercise instruction   Person(s) Educated Patient    Methods Explanation;Demonstration;Verbal cues   Comprehension Verbalized understanding;Returned demonstration;Verbal cues required             PT Long Term Goals - 06/28/17 1130      PT LONG TERM GOAL #1   Title Patient will demonstrate improved function right LE for daily activities including sitting, standing, walking with LEFS score of 60/80    Baseline LEFS 20/80   Status New   Target Date 08/23/17     PT LONG TERM GOAL #2   Title Patient will be independent with home program for flexibility, strengthening and pain control  to allow transition to self management once discharged from physical therapy   Baseline limited knowledge, requires cuing and guidance for appropriate exercise and performance   Status New   Target Date 08/23/17     PT LONG TERM GOAL #3   Title Patient will be independent with ambulation without AD with normal gait pattern and gait speed for improved function with community ambulation   Baseline crutches, PWB right LE   Status New   Target Date 07/24/17     PT LONG TERM GOAL #4   Title Patient will improve AROM right knee flexion to WNL with minimal to no pain to improve transfers and stair climbing   Baseline right knee flexion 0-75   Status New   Target Date 07/30/17               Plan - 07/19/17 1250    Clinical Impression Statement Patient demonstrated improved swelling and pain with improved ambulation wiht less difficulty at end of session. Slow, steady progress towards goals due to continued pain and weakness s/p knee surgery.    Rehab Potential Good   Clinical Impairments Affecting Rehab Potential (+)motivated, active lifestyle prior level of function(-)comorbidities MS, prior knee pain    PT Frequency 2x / week   PT Duration 8 weeks   PT Treatment/Interventions Iontophoresis 4mg /ml Dexamethasone;Therapeutic activities;Therapeutic exercise;Moist Heat;Manual techniques;Patient/family education;Cryotherapy;Neuromuscular  re-education;Ultrasound;Electrical Stimulation   PT Next Visit Plan electrical stimulation for pain control, meuromuscular re-education   PT Home Exercise Plan hip, knee quad sets, guleal sets, hip adduction with ball and glute sets; use of ice for pain control, elevation for control of swelling      Patient will benefit from skilled therapeutic intervention in order to improve the following deficits and impairments:  Pain, Decreased strength, Difficulty walking, Decreased range of motion, Decreased endurance, Impaired perceived functional ability, Decreased activity tolerance  Visit Diagnosis: Acute pain of right knee  Stiffness of right knee, not elsewhere classified  Muscle weakness (generalized)  Difficulty in walking, not elsewhere classified     Problem List Patient Active Problem List   Diagnosis Date Noted  . Left shoulder pain 04/24/2017  . Right knee pain 04/24/2017  . Hemorrhoids 08/02/2016  . Low back pain 05/17/2016  . Right hip pain 05/17/2016  . Chest tightness 03/01/2016  . Rash 01/31/2015  . Health care maintenance 01/31/2015  . Left knee pain 10/12/2013  . GERD (gastroesophageal reflux disease) 12/29/2012  . Abnormal Pap smear of cervix 12/29/2012  .  Essential hypertension, benign 12/25/2012  . Anemia 12/25/2012  . Degeneration of lumbar or lumbosacral intervertebral disc 12/25/2012  . Hypercholesterolemia 12/25/2012  . Vitamin D deficiency 12/25/2012  . Multiple sclerosis (HCC) 12/25/2012    [name] PT 07/20/2017, 9:24 AM  [facility] PHYSICAL AND SPORTS MEDICINE [address] [place], [address] Phone: [phone]   Fax:  [phone]  Name: Jackie White MRN: [id number] Date of Birth: May 21, 1956

## 2017-07-26 ENCOUNTER — Ambulatory Visit: Payer: BC Managed Care – PPO | Attending: Orthopedic Surgery | Admitting: Physical Therapy

## 2017-07-26 ENCOUNTER — Ambulatory Visit: Payer: BC Managed Care – PPO | Admitting: Physical Therapy

## 2017-07-26 DIAGNOSIS — R262 Difficulty in walking, not elsewhere classified: Secondary | ICD-10-CM | POA: Insufficient documentation

## 2017-07-26 DIAGNOSIS — M25661 Stiffness of right knee, not elsewhere classified: Secondary | ICD-10-CM | POA: Insufficient documentation

## 2017-07-26 DIAGNOSIS — M6281 Muscle weakness (generalized): Secondary | ICD-10-CM | POA: Diagnosis present

## 2017-07-26 DIAGNOSIS — M25561 Pain in right knee: Secondary | ICD-10-CM | POA: Diagnosis not present

## 2017-07-27 NOTE — Therapy (Signed)
[facility] [address], [place], [address] Phone: [phone]   Fax:  [phone]  Physical Therapy Treatment  Patient Details  Name: Jackie White MRN: [id number] Date of Birth: 05/20/1956 Referring Provider: Patsy Lager MD  Encounter Date: 07/26/2017      PT End of Session - 07/26/17 1300    Visit Number 8   Number of Visits 16   Date for PT Re-Evaluation 08/23/17   PT Start Time 1214   PT Stop Time 1300   PT Time Calculation (min) 46 min   Activity Tolerance Patient tolerated treatment well;Patient limited by pain   Behavior During Therapy [facility] for tasks assessed/performed      Past Medical History:  Diagnosis Date  . Abnormal Pap smear   . Cervical disc disease    Cervical and lumbar disc disease  . GERD (gastroesophageal reflux disease)   . History of iron deficiency   . Hypertension   . Multiple sclerosis (HCC)   . Personal history of colonic polyps   . Reactive airway disease   . Vitamin D deficiency     Past Surgical History:  Procedure Laterality Date  . BREAST REDUCTION SURGERY    . REDUCTION MAMMAPLASTY Bilateral 2002  . TOTAL ABDOMINAL HYSTERECTOMY  2003   Right Oophrectomy    There were no vitals filed for this visit.      Subjective Assessment - 07/26/17 1230    Subjective Patient feeling better and feels ready to exercise more.    Pertinent History May 2018 patient reports her right knee began hurting her and caused her to limp with walking, no apparent reason. S/P right knee arthroscopy 06/21/2017 for partial lateral meniscectomy and chondroplasty   Limitations Standing;House hold activities;Walking;Sitting;Other (comment)   Diagnostic tests MRI right knee   Patient Stated Goals decrease pain and be able to walk with less difficulty and sit without pain   Currently in Pain? Yes   Pain Score 3    Pain Location Knee   Pain Orientation Right   Pain Descriptors /  Indicators Aching   Pain Type Surgical pain  06/21/2017   Pain Onset 1 to 4 weeks ago   Pain Frequency Constant         Objective: Gait: ambulating with one axillary crutch, WBAT right LE, mild limp on right, decreased weight shift to right Observation: mild to moderate swelling right knee, mild warmth noted, decreased as compared to previous session  Treatment: Modalities: performed at end of session:  Electrical stimulation: 15 minRussian stim. 10/10 cycle applied (2) electrodes to quadriceps/VMO right LE with patient reclined with LE supported on pillow; pt. Performing quad sets with each cycle; goal muscle re education High volt estim.clincial program for muscle spasms (2) electrodes applied to medial and lateral aspect of right kneeintensity to tolerance with patient in reclined position with LE supported on pillow goal: pain, spasms  Therapeutic exercise:patient performed exercises with verbal, tactile cues and demonstration of therapist: goal: independent with home program, improve LEFS, strength Supine lying: Hip abduction with manual resistance x 10 reps with moderate resistance 5 second holds SLR supine lying with assistance of therapist 3 x 5 -8 reps Hip abduction/adduction with assistance of therapist 3 x 5-10 reps Hip/knee flexion/extension AAROM with assist of therapist supine lying x 10 reps NuStep x 10 min. For ROM and weight shifting level #4  Patient response to treatment; Required less assistance to perform SLR and hip abduction/adduction exercises  today. Improved gait pattern with increased weight whift to right. Increased soreness following NuStep        PT Education - 07/26/17 1300    Education provided Yes   Education Details exercise instruction   Person(s) Educated Patient   Methods Explanation;Verbal cues   Comprehension Verbalized understanding;Verbal cues required             PT Long Term Goals - 06/28/17 1130      PT LONG TERM GOAL  #1   Title Patient will demonstrate improved function right LE for daily activities including sitting, standing, walking with LEFS score of 60/80    Baseline LEFS 20/80   Status New   Target Date 08/23/17     PT LONG TERM GOAL #2   Title Patient will be independent with home program for flexibility, strengthening and pain control  to allow transition to self management once discharged from physical therapy   Baseline limited knowledge, requires cuing and guidance for appropriate exercise and performance   Status New   Target Date 08/23/17     PT LONG TERM GOAL #3   Title Patient will be independent with ambulation without AD with normal gait pattern and gait speed for improved function with community ambulation   Baseline crutches, PWB right LE   Status New   Target Date 07/24/17     PT LONG TERM GOAL #4   Title Patient will improve AROM right knee flexion to WNL with minimal to no pain to improve transfers and stair climbing   Baseline right knee flexion 0-75   Status New   Target Date 07/30/17               Plan - 07/26/17 1254    Clinical Impression Statement Patient is progressing well with exercises and able to ambulate with less pain and improved weight bearing to right LE. She continues with limitations of ROM, strength and endurance and will benefit from continued physical therapy intervention.    Rehab Potential Good   Clinical Impairments Affecting Rehab Potential (+)motivated, active lifestyle prior level of function(-)comorbidities MS, prior knee pain    PT Frequency 2x / week   PT Duration 8 weeks   PT Treatment/Interventions Iontophoresis /ml Dexamethasone;Therapeutic activities;Therapeutic exercise;Moist Heat;Manual techniques;Patient/family education;Cryotherapy;Neuromuscular re-education;Ultrasound;Electrical Stimulation   PT Next Visit Plan electrical stimulation for pain control, meuromuscular re-education   PT Home Exercise Plan hip, knee quad sets,  guleal sets, hip adduction with ball and glute sets; use of ice for pain control, elevation for control of swelling      Patient will benefit from skilled therapeutic intervention in order to improve the following deficits and impairments:  Pain, Decreased strength, Difficulty walking, Decreased range of motion, Decreased endurance, Impaired perceived functional ability, Decreased activity tolerance  Visit Diagnosis: Acute pain of right knee  Stiffness of right knee, not elsewhere classified  Muscle weakness (generalized)  Difficulty in walking, not elsewhere classified     Problem List Patient Active Problem List   Diagnosis Date Noted  . Left shoulder pain 04/24/2017  . Right knee pain 04/24/2017  . Hemorrhoids 08/02/2016  . Low back pain 05/17/2016  . Right hip pain 05/17/2016  . Chest tightness 03/01/2016  . Rash 01/31/2015  . Health care maintenance 01/31/2015  . Left knee pain 10/12/2013  . GERD (gastroesophageal reflux disease) 12/29/2012  . Abnormal Pap smear of cervix 12/29/2012  . Essential hypertension, benign 12/25/2012  . Anemia 12/25/2012  . Degeneration of lumbar or lumbosacral intervertebral  disc 12/25/2012  . Hypercholesterolemia 12/25/2012  . Vitamin D deficiency 12/25/2012  . Multiple sclerosis (HCC) 12/25/2012    [name] PT 07/27/2017, 11:29 AM  [facility] PHYSICAL AND SPORTS MEDICINE [address] [place], [address] Phone: [phone]   Fax:  [phone]  Name: Jackie White MRN: [id number] Date of Birth: 03-14-1956

## 2017-07-30 ENCOUNTER — Ambulatory Visit: Payer: BC Managed Care – PPO | Admitting: Physical Therapy

## 2017-07-30 ENCOUNTER — Encounter: Payer: Self-pay | Admitting: Physical Therapy

## 2017-07-30 DIAGNOSIS — M25561 Pain in right knee: Secondary | ICD-10-CM

## 2017-07-30 DIAGNOSIS — R262 Difficulty in walking, not elsewhere classified: Secondary | ICD-10-CM

## 2017-07-30 DIAGNOSIS — M6281 Muscle weakness (generalized): Secondary | ICD-10-CM

## 2017-07-30 DIAGNOSIS — M25661 Stiffness of right knee, not elsewhere classified: Secondary | ICD-10-CM

## 2017-07-30 NOTE — Therapy (Signed)
[facility] [address], [place], [address] Phone: [phone]   Fax:  [phone]  Physical Therapy Treatment  Patient Details  Name: Jackie White MRN: [id number] Date of Birth: September 04, 1956 Referring Provider: Patsy Lager MD  Encounter Date: 07/30/2017      PT End of Session - 07/30/17 1852    Visit Number 9   Number of Visits 16   Date for PT Re-Evaluation 08/23/17   PT Start Time 1808   PT Stop Time 1855   PT Time Calculation (min) 47 min   Activity Tolerance Patient tolerated treatment well;Patient limited by pain   Behavior During Therapy [facility] for tasks assessed/performed      Past Medical History:  Diagnosis Date  . Abnormal Pap smear   . Cervical disc disease    Cervical and lumbar disc disease  . GERD (gastroesophageal reflux disease)   . History of iron deficiency   . Hypertension   . Multiple sclerosis (HCC)   . Personal history of colonic polyps   . Reactive airway disease   . Vitamin D deficiency     Past Surgical History:  Procedure Laterality Date  . BREAST REDUCTION SURGERY    . REDUCTION MAMMAPLASTY Bilateral 2002  . TOTAL ABDOMINAL HYSTERECTOMY  2003   Right Oophrectomy    There were no vitals filed for this visit.      Subjective Assessment - 07/30/17 1850    Subjective Patient feeling better with less knee pain with walking..    Pertinent History May 2018 patient reports her right knee began hurting her and caused her to limp with walking, no apparent reason. S/P right knee arthroscopy 06/21/2017 for partial lateral meniscectomy and chondroplasty   Limitations Standing;House hold activities;Walking;Sitting;Other (comment)   Diagnostic tests MRI right knee   Patient Stated Goals decrease pain and be able to walk with less difficulty and sit without pain   Currently in Pain? Yes   Pain Score 5    Pain Location Knee   Pain Orientation Right   Pain Descriptors /  Indicators Aching;Tightness   Pain Type Surgical pain  06/21/2017   Pain Onset 1 to 4 weeks ago   Pain Frequency Constant       Objective: Gait: ambulating with one axillary crutch, WBAT right LE, mild limp on right, decreased weight shift to right Observation: mild to moderate swelling right knee AROM: right knee 0-110 degrees flexion  Treatment: Modalities: performed at end of session:  Electrical stimulation: 20 miinRussian stim. 10/10 cycle applied (2) electrodes to quadriceps/VMO right LE with patient reclined with LE supported on pillow; pt. Performing quad sets with each cycle; goal muscle re education High volt estim.clincial program for muscle spasms (2) electrodes applied to medial and lateral aspect of right kneeintensity to tolerance with patient in reclined position with LE supported on pillow goal: pain, spasms  Therapeutic exercise:patient performed exercises with verbal, tactile cues and demonstration of therapist: goal: independent with home program, improve LEFS, strength Supine lying: Hip abduction with manual resistance x 10 reps with moderate resistance 5 second holds SLR supine lying with assistance of therapist 3x 5 -8 reps Hip abduction/adduction with assistance of therapist 3 x 5-10 reps NuStep x 10 min. For ROM and weight shifting level #4 (increased soreness right knee following NuStep)  Patient response to treatment; Required minimal assistance to perform SLR and hip abduction/adduction exercises.  Increased soreness following NuStep. Improved pain level in knee at end  of session to 3/10 following modalities       PT Education - 07/30/17 1851    Education provided Yes   Education Details exercise instruction   Person(s) Educated Patient   Methods Explanation;Verbal cues;Demonstration   Comprehension Verbalized understanding;Returned demonstration;Verbal cues required             PT Long Term Goals - 06/28/17 1130      PT LONG TERM GOAL  #1   Title Patient will demonstrate improved function right LE for daily activities including sitting, standing, walking with LEFS score of 60/80    Baseline LEFS 20/80   Status New   Target Date 08/23/17     PT LONG TERM GOAL #2   Title Patient will be independent with home program for flexibility, strengthening and pain control  to allow transition to self management once discharged from physical therapy   Baseline limited knowledge, requires cuing and guidance for appropriate exercise and performance   Status New   Target Date 08/23/17     PT LONG TERM GOAL #3   Title Patient will be independent with ambulation without AD with normal gait pattern and gait speed for improved function with community ambulation   Baseline crutches, PWB right LE   Status New   Target Date 07/24/17     PT LONG TERM GOAL #4   Title Patient will improve AROM right knee flexion to WNL with minimal to no pain to improve transfers and stair climbing   Baseline right knee flexion 0-75   Status New   Target Date 07/30/17               Plan - 07/30/17 1853    Clinical Impression Statement Patient is progressing steadily with strength with advancing exercises. She continues with decreased strength and difficulty with walking without pain and will therefore benefit from continued physical therapy intervention to achieve goals.    Rehab Potential Good   Clinical Impairments Affecting Rehab Potential (+)motivated, active lifestyle prior level of function(-)comorbidities MS, prior knee pain    PT Frequency 2x / week   PT Duration 8 weeks   PT Treatment/Interventions Iontophoresis /ml Dexamethasone;Therapeutic activities;Therapeutic exercise;Moist Heat;Manual techniques;Patient/family education;Cryotherapy;Neuromuscular re-education;Ultrasound;Electrical Stimulation   PT Next Visit Plan electrical stimulation for pain control, meuromuscular re-education   PT Home Exercise Plan hip, knee quad sets, guleal  sets, hip adduction with ball and glute sets; use of ice for pain control, elevation for control of swelling      Patient will benefit from skilled therapeutic intervention in order to improve the following deficits and impairments:  Pain, Decreased strength, Difficulty walking, Decreased range of motion, Decreased endurance, Impaired perceived functional ability, Decreased activity tolerance  Visit Diagnosis: Acute pain of right knee  Stiffness of right knee, not elsewhere classified  Muscle weakness (generalized)  Difficulty in walking, not elsewhere classified     Problem List Patient Active Problem List   Diagnosis Date Noted  . Left shoulder pain 04/24/2017  . Right knee pain 04/24/2017  . Hemorrhoids 08/02/2016  . Low back pain 05/17/2016  . Right hip pain 05/17/2016  . Chest tightness 03/01/2016  . Rash 01/31/2015  . Health care maintenance 01/31/2015  . Left knee pain 10/12/2013  . GERD (gastroesophageal reflux disease) 12/29/2012  . Abnormal Pap smear of cervix 12/29/2012  . Essential hypertension, benign 12/25/2012  . Anemia 12/25/2012  . Degeneration of lumbar or lumbosacral intervertebral disc 12/25/2012  . Hypercholesterolemia 12/25/2012  . Vitamin D deficiency 12/25/2012  .  Multiple sclerosis (HCC) 12/25/2012    [name] PT 07/31/2017, 1:45 PM  [facility] PHYSICAL AND SPORTS MEDICINE [address] [place], [address] Phone: [phone]   Fax:  [phone]  Name: WATEEN VARON MRN: [id number] Date of Birth: [birth year]

## 2017-08-02 ENCOUNTER — Ambulatory Visit: Payer: BC Managed Care – PPO | Admitting: Physical Therapy

## 2017-08-06 ENCOUNTER — Encounter: Payer: Self-pay | Admitting: Physical Therapy

## 2017-08-06 ENCOUNTER — Ambulatory Visit: Payer: BC Managed Care – PPO | Admitting: Physical Therapy

## 2017-08-06 DIAGNOSIS — R262 Difficulty in walking, not elsewhere classified: Secondary | ICD-10-CM

## 2017-08-06 DIAGNOSIS — M6281 Muscle weakness (generalized): Secondary | ICD-10-CM

## 2017-08-06 DIAGNOSIS — M25561 Pain in right knee: Secondary | ICD-10-CM | POA: Diagnosis not present

## 2017-08-06 DIAGNOSIS — M25661 Stiffness of right knee, not elsewhere classified: Secondary | ICD-10-CM

## 2017-08-06 NOTE — Therapy (Signed)
[facility] [address] [place], [address] Phone: [phone]   Fax:  [phone]  Physical Therapy Treatment  Patient Details  Name: Jackie White MRN: [id number] Date of Birth: 1956-05-20 Referring Provider: Patsy Lager MD  Encounter Date: 08/06/2017      PT End of Session - 08/06/17 1514    Visit Number 10   Number of Visits 16   Date for PT Re-Evaluation 08/23/17   PT Start Time 1508   PT Stop Time 1600   PT Time Calculation (min) 52 min   Activity Tolerance Patient tolerated treatment well;Patient limited by pain   Behavior During Therapy [facility] for tasks assessed/performed      Past Medical History:  Diagnosis Date  . Abnormal Pap smear   . Cervical disc disease    Cervical and lumbar disc disease  . GERD (gastroesophageal reflux disease)   . History of iron deficiency   . Hypertension   . Multiple sclerosis (HCC)   . Personal history of colonic polyps   . Reactive airway disease   . Vitamin D deficiency     Past Surgical History:  Procedure Laterality Date  . BREAST REDUCTION SURGERY    . REDUCTION MAMMAPLASTY Bilateral 2002  . TOTAL ABDOMINAL HYSTERECTOMY  2003   Right Oophrectomy    There were no vitals filed for this visit.      Subjective Assessment - 08/06/17 1510    Subjective Patient reports she is still feeling popping in her right knee after siting a while and then standing up.    Pertinent History May 2018 patient reports her right knee began hurting her and caused her to limp with walking, no apparent reason. S/P right knee arthroscopy 06/21/2017 for partial lateral meniscectomy and chondroplasty   Limitations Standing;House hold activities;Walking;Sitting;Other (comment)   Diagnostic tests MRI right knee   Patient Stated Goals decrease pain and be able to walk with less difficulty and sit without pain   Currently in Pain? Yes   Pain Score 3    Pain Location Knee    Pain Orientation Right   Pain Descriptors / Indicators Aching;Tightness   Pain Type Surgical pain  06/21/2017   Pain Onset More than a month ago   Pain Frequency Intermittent        Objective: Gait: ambulating with one axillary crutch, WBAT right LE, mild limp on right, decreased weight shift to right Observation: mild to moderate swelling right knee with mild warmth distal to patella AROM: right knee 0-110/115 degrees flexion  Treatment: Modalities: performed at end of session:  Electrical stimulation: 20 miinRussian stim. 10/10 cycle, intensity to tolerance, contraction applied (2) electrodes to quadriceps/VMO right LE with patient reclined with LE supported on pillow; pt. Performing quad sets with each cycle; goal muscle re education High volt estim.clincial program for muscle spasms (2) electrodes applied to medial and lateral aspect of right kneeintensity to tolerance with patient in reclined position with LE supported on pillow goal: pain, spasms  Therapeutic exercise:patient performed exercises with verbal, tactile cues and demonstration of therapist: goal: independent with home program, improve LEFS, strength Sitting at edge of treatment table: Knee flexion MAI 3 sets, submaximal resistance given Hip abduction with manual resistance x 5 reps with moderate resistance 10 second holds SLR supine lying with assistance of therapist 3x 5 -8reps Leg press 25# x 15, 35# x 15 reps short arc to avoid increased pain/strain on knee NuStep x 10 min. For  ROM and weight shifting level #4   Patient response to treatment; improved technique and ability to perform leg press with less difficulty with guidance for correct intensity and ROM to shorten arc of motion. no increased soreness reported following Nustep today.           PT Education - 08/06/17 1513    Education provided Yes   Education Details exercise instruction   Person(s) Educated Patient   Methods Explanation;Verbal  cues;Demonstration   Comprehension Verbalized understanding;Returned demonstration;Verbal cues required             PT Long Term Goals - 08/06/17 1552      PT LONG TERM GOAL #1   Title Patient will demonstrate improved function right LE for daily activities including sitting, standing, walking with LEFS score of 60/80    Baseline LEFS 20/80; LEFS 35/80 08/06/2017   Status On-going   Target Date 08/23/17     PT LONG TERM GOAL #2   Title Patient will be independent with home program for flexibility, strengthening and pain control  to allow transition to self management once discharged from physical therapy   Baseline limited knowledge, requires cuing and guidance for appropriate exercise and performance   Status On-going   Target Date 08/23/17     PT LONG TERM GOAL #3   Title Patient will be independent with ambulation without AD with normal gait pattern and gait speed for improved function with community ambulation   Baseline crutches, PWB right LE   Status Revised   Target Date 08/23/17     PT LONG TERM GOAL #4   Title Patient will improve AROM right knee flexion to WNL with minimal to no pain to improve transfers and stair climbing   Baseline right knee flexion 0-75; 08/06/2017 0-110/115   Status Revised   Target Date 08/23/17               Plan - 08/06/17 1550    Clinical Impression Statement Patient is progressing steadily with strength and ROM. She continues with increased warmth and decreased strength in right LE s/p surgery. She should continue to improve with continued physical therapy intervention.    Rehab Potential Good   Clinical Impairments Affecting Rehab Potential (+)motivated, active lifestyle prior level of function(-)comorbidities MS, prior knee pain    PT Frequency 2x / week   PT Duration 8 weeks   PT Treatment/Interventions Iontophoresis /ml Dexamethasone;Therapeutic activities;Therapeutic exercise;Moist Heat;Manual techniques;Patient/family  education;Cryotherapy;Neuromuscular re-education;Ultrasound;Electrical Stimulation   PT Next Visit Plan electrical stimulation for pain control, meuromuscular re-education   PT Home Exercise Plan hip, knee quad sets, guleal sets, hip adduction with ball and glute sets; use of ice for pain control, elevation for control of swelling      Patient will benefit from skilled therapeutic intervention in order to improve the following deficits and impairments:  Pain, Decreased strength, Difficulty walking, Decreased range of motion, Decreased endurance, Impaired perceived functional ability, Decreased activity tolerance  Visit Diagnosis: Acute pain of right knee  Stiffness of right knee, not elsewhere classified  Muscle weakness (generalized)  Difficulty in walking, not elsewhere classified     Problem List Patient Active Problem List   Diagnosis Date Noted  . Left shoulder pain 04/24/2017  . Right knee pain 04/24/2017  . Hemorrhoids 08/02/2016  . Low back pain 05/17/2016  . Right hip pain 05/17/2016  . Chest tightness 03/01/2016  . Rash 01/31/2015  . Health care maintenance 01/31/2015  . Left knee pain 10/12/2013  .  GERD (gastroesophageal reflux disease) 12/29/2012  . Abnormal Pap smear of cervix 12/29/2012  . Essential hypertension, benign 12/25/2012  . Anemia 12/25/2012  . Degeneration of lumbar or lumbosacral intervertebral disc 12/25/2012  . Hypercholesterolemia 12/25/2012  . Vitamin D deficiency 12/25/2012  . Multiple sclerosis (HCC) 12/25/2012    [name] PT 08/06/2017, 5:38 PM  [place] [facility] PHYSICAL AND SPORTS MEDICINE [address], [place], [address] Phone: [phone]   Fax:  [phone]  Name: RAVEENA DOERFLER MRN: [id number] Date of Birth: Oct 06, 1956

## 2017-08-08 ENCOUNTER — Ambulatory Visit: Payer: BC Managed Care – PPO | Admitting: Physical Therapy

## 2017-08-08 DIAGNOSIS — M25661 Stiffness of right knee, not elsewhere classified: Secondary | ICD-10-CM

## 2017-08-08 DIAGNOSIS — R262 Difficulty in walking, not elsewhere classified: Secondary | ICD-10-CM

## 2017-08-08 DIAGNOSIS — M25561 Pain in right knee: Secondary | ICD-10-CM | POA: Diagnosis not present

## 2017-08-08 DIAGNOSIS — M6281 Muscle weakness (generalized): Secondary | ICD-10-CM

## 2017-08-09 NOTE — Therapy (Signed)
[place] [facility] [address] [place], [address] Phone: [phone]   Fax:  [phone]  Physical Therapy Treatment  Patient Details  Name: Jackie White MRN: [id number] Date of Birth: 1956/05/16 Referring Provider: Patsy Lager MD  Encounter Date: 08/08/2017      PT End of Session - 08/08/17 1913    Visit Number 11   Number of Visits 16   Date for PT Re-Evaluation 08/23/17   PT Start Time 1810   PT Stop Time 1900   PT Time Calculation (min) 50 min   Activity Tolerance Patient tolerated treatment well;Patient limited by pain   Behavior During Therapy [facility] for tasks assessed/performed      Past Medical History:  Diagnosis Date  . Abnormal Pap smear   . Cervical disc disease    Cervical and lumbar disc disease  . GERD (gastroesophageal reflux disease)   . History of iron deficiency   . Hypertension   . Multiple sclerosis (HCC)   . Personal history of colonic polyps   . Reactive airway disease   . Vitamin D deficiency     Past Surgical History:  Procedure Laterality Date  . BREAST REDUCTION SURGERY    . REDUCTION MAMMAPLASTY Bilateral 2002  . TOTAL ABDOMINAL HYSTERECTOMY  2003   Right Oophrectomy    There were no vitals filed for this visit.      Subjective Assessment - 08/08/17 1815    Subjective Patient reports she is seeing steady improvement with right knee and able to walk better.    Pertinent History May 2018 patient reports her right knee began hurting her and caused her to limp with walking, no apparent reason. S/P right knee arthroscopy 06/21/2017 for partial lateral meniscectomy and chondroplasty   Limitations Standing;House hold activities;Walking;Sitting;Other (comment)   Diagnostic tests MRI right knee   Patient Stated Goals decrease pain and be able to walk with less difficulty and sit without pain   Currently in Pain? Yes   Pain Score 2    Pain Location Knee   Pain  Orientation Right   Pain Descriptors / Indicators Aching   Pain Type Surgical pain  06/21/2017   Pain Onset More than a month ago   Pain Frequency Intermittent         Objective: Gait: ambulating with one axillary crutch, WBAT right LE, mild limp on right, decreased weight shift to right; able to ambulate without AD for short distances Observation: mild to moderate swelling right knee with mild warmth distal to patella AROM: right knee 0-110/115 degrees flexion  Treatment: Modalities: performed at end of session:  Electrical stimulation: 20 miinRussian stim. 10/10 cycle, intensity to tolerance, contraction applied (2) electrodes to quadriceps/VMO right LE with patient reclined with LE supported on pillow; pt. Performing quad sets with each cycle; goal muscle re education High volt estim.clincial program for muscle spasms (2) electrodes applied to medial and lateral aspect of right kneeintensity to tolerance with patient in reclined position with LE supported on pillow goal: pain, spasms  Therapeutic exercise:patient performed exercises with verbal, tactile cues and demonstration of therapist: goal: independent with home program, improve LEFS, strength Sitting at edge of treatment table: Leg press 25# x 15, 35# x 15 reps short arc to avoid increased pain/strain on knee Hip rotary machine hip extension 115# right LE only x 15; abduction 40# right LE only x 15 reps Side stepping along airex balance beam x 2 min. With using UE's for  support as needed Balance system for weight shift, limits of stability with platform unlocked #10: 3 skill levels x 1 reps each up to 80% accuracy  NuStep x 10 min. For ROM and weight shifting level #4   Patient response to treatment; Improved endurance and ability to perform weight shifting with increased accuracy with repetition on balance system. She continues to decrease weight shift to right LE.           PT Education - 08/08/17 1900     Education provided Yes   Education Details exercise instruction; balance system for weight shifting   Person(s) Educated Patient   Methods Explanation;Demonstration;Verbal cues   Comprehension Verbalized understanding;Verbal cues required;Returned demonstration             PT Long Term Goals - 08/06/17 1552      PT LONG TERM GOAL #1   Title Patient will demonstrate improved function right LE for daily activities including sitting, standing, walking with LEFS score of 60/80    Baseline LEFS 20/80; LEFS 35/80 08/06/2017   Status On-going   Target Date 08/23/17     PT LONG TERM GOAL #2   Title Patient will be independent with home program for flexibility, strengthening and pain control  to allow transition to self management once discharged from physical therapy   Baseline limited knowledge, requires cuing and guidance for appropriate exercise and performance   Status On-going   Target Date 08/23/17     PT LONG TERM GOAL #3   Title Patient will be independent with ambulation without AD with normal gait pattern and gait speed for improved function with community ambulation   Baseline crutches, PWB right LE   Status Revised   Target Date 08/23/17     PT LONG TERM GOAL #4   Title Patient will improve AROM right knee flexion to WNL with minimal to no pain to improve transfers and stair climbing   Baseline right knee flexion 0-75; 08/06/2017 0-110/115   Status Revised   Target Date 08/23/17               Plan - 08/08/17 1922    Clinical Impression Statement Patient is progressing with strengthening exercises and proprioception. she conitnues with weakness and decreased ROM with limited function with walking and daily tasks. she will benefit from continued physical therapy interveniton to achieve goals.    Rehab Potential Good   Clinical Impairments Affecting Rehab Potential (+)motivated, active lifestyle prior level of function(-)comorbidities MS, prior knee pain    PT  Frequency 2x / week   PT Duration 8 weeks   PT Treatment/Interventions Iontophoresis /ml Dexamethasone;Therapeutic activities;Therapeutic exercise;Moist Heat;Manual techniques;Patient/family education;Cryotherapy;Neuromuscular re-education;Ultrasound;Electrical Stimulation   PT Next Visit Plan electrical stimulation for pain control, meuromuscular re-education   PT Home Exercise Plan hip, knee quad sets, guleal sets, hip adduction with ball and glute sets; use of ice for pain control, elevation for control of swelling      Patient will benefit from skilled therapeutic intervention in order to improve the following deficits and impairments:  Pain, Decreased strength, Difficulty walking, Decreased range of motion, Decreased endurance, Impaired perceived functional ability, Decreased activity tolerance  Visit Diagnosis: Acute pain of right knee  Stiffness of right knee, not elsewhere classified  Difficulty in walking, not elsewhere classified  Muscle weakness (generalized)     Problem List Patient Active Problem List   Diagnosis Date Noted  . Left shoulder pain 04/24/2017  . Right knee pain 04/24/2017  . Hemorrhoids 08/02/2016  .  Low back pain 05/17/2016  . Right hip pain 05/17/2016  . Chest tightness 03/01/2016  . Rash 01/31/2015  . Health care maintenance 01/31/2015  . Left knee pain 10/12/2013  . GERD (gastroesophageal reflux disease) 12/29/2012  . Abnormal Pap smear of cervix 12/29/2012  . Essential hypertension, benign 12/25/2012  . Anemia 12/25/2012  . Degeneration of lumbar or lumbosacral intervertebral disc 12/25/2012  . Hypercholesterolemia 12/25/2012  . Vitamin D deficiency 12/25/2012  . Multiple sclerosis (HCC) 12/25/2012    [name] PT 08/09/2017, 11:36 PM  [place] [facility] PHYSICAL AND SPORTS MEDICINE [address] [place], [address] Phone: [phone]   Fax:  [phone]  Name: ELUTERIA BROCKSCHMIDT MRN:  [id number] Date of Birth: 1956/01/18

## 2017-08-13 ENCOUNTER — Ambulatory Visit: Payer: BC Managed Care – PPO | Admitting: Physical Therapy

## 2017-08-13 ENCOUNTER — Encounter: Payer: Self-pay | Admitting: Physical Therapy

## 2017-08-13 DIAGNOSIS — M6281 Muscle weakness (generalized): Secondary | ICD-10-CM

## 2017-08-13 DIAGNOSIS — M25661 Stiffness of right knee, not elsewhere classified: Secondary | ICD-10-CM

## 2017-08-13 DIAGNOSIS — M25561 Pain in right knee: Secondary | ICD-10-CM | POA: Diagnosis not present

## 2017-08-13 DIAGNOSIS — R262 Difficulty in walking, not elsewhere classified: Secondary | ICD-10-CM

## 2017-08-13 NOTE — Therapy (Signed)
[place] [facility]TS [facility] [address], [place], [address] Phone: [phone]   Fax:  [phone]  Physical Therapy Treatment  Patient Details  Name: Jackie White MRN: [id number] Date of Birth: 04-21-1956 Referring Provider: Patsy Lager MD  Encounter Date: 08/13/2017      PT End of Session - 08/13/17 1825    Visit Number 12   Number of Visits 16   Date for PT Re-Evaluation 08/23/17   PT Start Time 1815   PT Stop Time 1903   PT Time Calculation (min) 48 min   Activity Tolerance Patient tolerated treatment well;Patient limited by pain   Behavior During Therapy [facility] for tasks assessed/performed      Past Medical History:  Diagnosis Date  . Abnormal Pap smear   . Cervical disc disease    Cervical and lumbar disc disease  . GERD (gastroesophageal reflux disease)   . History of iron deficiency   . Hypertension   . Multiple sclerosis (HCC)   . Personal history of colonic polyps   . Reactive airway disease   . Vitamin D deficiency     Past Surgical History:  Procedure Laterality Date  . BREAST REDUCTION SURGERY    . REDUCTION MAMMAPLASTY Bilateral 2002  . TOTAL ABDOMINAL HYSTERECTOMY  2003   Right Oophrectomy    There were no vitals filed for this visit.      Subjective Assessment - 08/13/17 1821    Subjective patient reports hitting her right knee into a stool this morning. She arrives with 3/10 reported pain in clinic today.    Pertinent History May 2018 patient reports her right knee began hurting her and caused her to limp with walking, no apparent reason. S/P right knee arthroscopy 06/21/2017 for partial lateral meniscectomy and chondroplasty   Limitations Standing;House hold activities;Walking;Sitting;Other (comment)   Diagnostic tests MRI right knee   Patient Stated Goals decrease pain and be able to walk with less difficulty and sit without pain   Currently in Pain? Yes   Pain Score 3    Pain  Location Knee   Pain Orientation Right   Pain Descriptors / Indicators Aching   Pain Type Surgical pain  06/21/2017   Pain Onset More than a month ago   Pain Frequency Intermittent          Objective: Gait: ambulating with one axillary crutch, WBAT right LE, mild limp on right, decreased weight shift to right; able to ambulate without AD for short distances Observation: mild to moderate swelling right knee with mild warmth distal to patella AROM: right knee 0-110/115degrees flexion  Treatment: Modalities: performed at end of session:  Electrical stimulation: 20 miinRussian stim. 10/10 cycle, intensity to tolerance, contractionapplied (2) electrodes to quadriceps/VMO right LE with patient reclined with LE supported on pillow; pt. Performing quad sets with each cycle; goal muscle re education High volt estim.clincial program for muscle spasms (2) electrodes applied to medial and lateral aspect of right kneeintensity to tolerance with patient in reclined position with LE supported on pillow goal: pain, spasms  Therapeutic exercise:patient performed exercises with verbal, tactile cues and demonstration of therapist: goal: independent with home program, improve LEFS, strength Sitting at edge of treatment table: Leg press 35# x 15, 45# x 15 reps short arc to avoid increased pain/strain on knee Hip rotary machine hip extension 115# right LE only x 15; abduction 40# right LE only x 15 reps Side stepping along airex balance beam x 2 min.  With using UE's for support as needed Balance system for weight shift, limits of stability with platform unlocked #10: 3 skill levels x 1 reps each up to 80% accuracy  NuStep x 10 min. For ROM and weight shifting level #4   Patient response to treatment; Improved proprioception, balance on balance system. improved technique with all exercises with minimal cuing and assistance. Decreased pain and soreness knee at end of session.             PT  Education - 08/13/17 1823    Education provided Yes   Education Details exercise instruction   Person(s) Educated Patient   Methods Explanation;Demonstration;Verbal cues   Comprehension Verbalized understanding;Returned demonstration;Verbal cues required             PT Long Term Goals - 08/06/17 1552      PT LONG TERM GOAL #1   Title Patient will demonstrate improved function right LE for daily activities including sitting, standing, walking with LEFS score of 60/80    Baseline LEFS 20/80; LEFS 35/80 08/06/2017   Status On-going   Target Date 08/23/17     PT LONG TERM GOAL #2   Title Patient will be independent with home program for flexibility, strengthening and pain control  to allow transition to self management once discharged from physical therapy   Baseline limited knowledge, requires cuing and guidance for appropriate exercise and performance   Status On-going   Target Date 08/23/17     PT LONG TERM GOAL #3   Title Patient will be independent with ambulation without AD with normal gait pattern and gait speed for improved function with community ambulation   Baseline crutches, PWB right LE   Status Revised   Target Date 08/23/17     PT LONG TERM GOAL #4   Title Patient will improve AROM right knee flexion to WNL with minimal to no pain to improve transfers and stair climbing   Baseline right knee flexion 0-75; 08/06/2017 0-110/115   Status Revised   Target Date 08/23/17               Plan - 08/13/17 1832    Clinical Impression Statement Patient is progressing with strengthening exercises and proprioception. She continues with decreased strength and ROM in right LE and will benefit from continued physical therpay intervention to achieve goals.    Rehab Potential Good   Clinical Impairments Affecting Rehab Potential (+)motivated, active lifestyle prior level of function(-)comorbidities MS, prior knee pain    PT Frequency 2x / week   PT Duration 8 weeks   PT  Treatment/Interventions Iontophoresis 4mg /ml Dexamethasone;Therapeutic activities;Therapeutic exercise;Moist Heat;Manual techniques;Patient/family education;Cryotherapy;Neuromuscular re-education;Ultrasound;Electrical Stimulation   PT Next Visit Plan electrical stimulation for pain control, meuromuscular re-education   PT Home Exercise Plan hip, knee quad sets, guleal sets, hip adduction with ball and glute sets; use of ice for pain control, elevation for control of swelling      Patient will benefit from skilled therapeutic intervention in order to improve the following deficits and impairments:  Pain, Decreased strength, Difficulty walking, Decreased range of motion, Decreased endurance, Impaired perceived functional ability, Decreased activity tolerance  Visit Diagnosis: Acute pain of right knee  Stiffness of right knee, not elsewhere classified  Difficulty in walking, not elsewhere classified  Muscle weakness (generalized)     Problem List Patient Active Problem List   Diagnosis Date Noted  . Left shoulder pain 04/24/2017  . Right knee pain 04/24/2017  . Hemorrhoids 08/02/2016  . Low back  pain 05/17/2016  . Right hip pain 05/17/2016  . Chest tightness 03/01/2016  . Rash 01/31/2015  . Health care maintenance 01/31/2015  . Left knee pain 10/12/2013  . GERD (gastroesophageal reflux disease) 12/29/2012  . Abnormal Pap smear of cervix 12/29/2012  . Essential hypertension, benign 12/25/2012  . Anemia 12/25/2012  . Degeneration of lumbar or lumbosacral intervertebral disc 12/25/2012  . Hypercholesterolemia 12/25/2012  . Vitamin D deficiency 12/25/2012  . Multiple sclerosis (HCC) 12/25/2012    [name] PT 08/14/2017, 11:13 PM  [place] [facility] PHYSICAL AND SPORTS MEDICINE [address], [place], [address] Phone: [phone]   Fax:  [phone]  Name: RHEA KAELIN MRN: [id number] Date of Birth: 10-Feb-1956

## 2017-08-15 ENCOUNTER — Encounter: Payer: Self-pay | Admitting: Physical Therapy

## 2017-08-15 ENCOUNTER — Ambulatory Visit: Payer: BC Managed Care – PPO | Admitting: Physical Therapy

## 2017-08-15 DIAGNOSIS — M6281 Muscle weakness (generalized): Secondary | ICD-10-CM

## 2017-08-15 DIAGNOSIS — M25561 Pain in right knee: Secondary | ICD-10-CM | POA: Diagnosis not present

## 2017-08-15 DIAGNOSIS — R262 Difficulty in walking, not elsewhere classified: Secondary | ICD-10-CM

## 2017-08-15 DIAGNOSIS — M25661 Stiffness of right knee, not elsewhere classified: Secondary | ICD-10-CM

## 2017-08-15 NOTE — Therapy (Signed)
[place] [facility]ORTS MEDICINE [address], [place], [address] Phone: [phone]   Fax:  [phone]  Physical Therapy Treatment  Patient Details  Name: Jackie White MRN: [id number] Date of Birth: 11-06-1956 Referring Provider: Patsy Lager MD  Encounter Date: 08/15/2017      PT End of Session - 08/15/17 1916    Visit Number 13   Number of Visits 16   Date for PT Re-Evaluation 08/23/17   PT Start Time 1802   PT Stop Time 1906   PT Time Calculation (min) 64 min   Activity Tolerance Patient tolerated treatment well;Patient limited by pain   Behavior During Therapy [facility] for tasks assessed/performed      Past Medical History:  Diagnosis Date  . Abnormal Pap smear   . Cervical disc disease    Cervical and lumbar disc disease  . GERD (gastroesophageal reflux disease)   . History of iron deficiency   . Hypertension   . Multiple sclerosis (HCC)   . Personal history of colonic polyps   . Reactive airway disease   . Vitamin D deficiency     Past Surgical History:  Procedure Laterality Date  . BREAST REDUCTION SURGERY    . REDUCTION MAMMAPLASTY Bilateral 2002  . TOTAL ABDOMINAL HYSTERECTOMY  2003   Right Oophrectomy    There were no vitals filed for this visit.      Subjective Assessment - 08/15/17 1804    Subjective Patient reports she is doing well and no concerns following previous session. She is not reporting any pain today and has mild swelling in right knee.    Pertinent History May 2018 patient reports her right knee began hurting her and caused her to limp with walking, no apparent reason. S/P right knee arthroscopy 06/21/2017 for partial lateral meniscectomy and chondroplasty   Limitations Standing;House hold activities;Walking;Sitting;Other (comment)   Diagnostic tests MRI right knee   Patient Stated Goals decrease pain and be able to walk with less difficulty and sit without pain   Currently in  Pain? No/denies   Pain Onset More than a month ago        Objective: Gait: ambulating with one axillary crutch, WBAT right LE, mild limp on right, decreased weight shift to right; able to ambulate without AD for short distances Observation: mild to moderate swelling right knee with mild warmth distal to patella  Treatment: Modalities: performed at end of session:  Electrical stimulation: 20 miinRussian stim. 10/10 cycle, intensity to tolerance, contractionapplied (2) electrodes to quadriceps/VMO right LE with patient reclined with LE supported on pillow; pt. Performing quad sets with each cycle; goal muscle re education High volt estim.clincial program for muscle spasms (2) electrodes applied to medial and lateral aspect of right kneeintensity to tolerance with patient in reclined position with LE supported on pillow goal: pain, spasms  Therapeutic exercise:patient performed exercises with verbal, tactile cues and demonstration of therapist: goal: independent with home program, improve LEFS, strength Standing balanced ball toss with patient standing on balance pad both LE's 10 reps Step ups onto balance beam leading with each LE 5 reps Leg press 25# x 15, 45# x 15 reps short arc to avoid increased pain/strain on knee Hip rotary machine hip extension 115# right LE only x 15; abduction 40# right LE only x 15 reps Side stepping along airex balance beam x 2 min. With using UE's for support as needed Balance system for weight shift, limits of stability with platform unlocked #  9: 3 skill levels x 1 reps each up to 92% accuracy  NuStep x 10 min. For ROM and weight shifting level #4   Patient response to treatment; Improved motor control and accuracy with balance system with repetition. Improved technique with all exercises with minimal cuing. Decreased soreness and pain with walking at end of session.          PT Education - 08/15/17 1805    Education provided Yes   Education  Details exercsise instruction   Person(s) Educated Patient   Methods Explanation;Verbal cues   Comprehension Verbalized understanding;Verbal cues required             PT Long Term Goals - 08/06/17 1552      PT LONG TERM GOAL #1   Title Patient will demonstrate improved function right LE for daily activities including sitting, standing, walking with LEFS score of 60/80    Baseline LEFS 20/80; LEFS 35/80 08/06/2017   Status On-going   Target Date 08/23/17     PT LONG TERM GOAL #2   Title Patient will be independent with home program for flexibility, strengthening and pain control  to allow transition to self management once discharged from physical therapy   Baseline limited knowledge, requires cuing and guidance for appropriate exercise and performance   Status On-going   Target Date 08/23/17     PT LONG TERM GOAL #3   Title Patient will be independent with ambulation without AD with normal gait pattern and gait speed for improved function with community ambulation   Baseline crutches, PWB right LE   Status Revised   Target Date 08/23/17     PT LONG TERM GOAL #4   Title Patient will improve AROM right knee flexion to WNL with minimal to no pain to improve transfers and stair climbing   Baseline right knee flexion 0-75; 08/06/2017 0-110/115   Status Revised   Target Date 08/23/17               Plan - 08/15/17 1900    Clinical Impression Statement Patient is progressing well with goals. She has mild fatigue and pain in knee with exercises. Patient will benefit from additional physical therapy intervention to achieve goals.    Rehab Potential Good   Clinical Impairments Affecting Rehab Potential (+)motivated, active lifestyle prior level of function(-)comorbidities MS, prior knee pain    PT Frequency 2x / week   PT Duration 8 weeks   PT Treatment/Interventions Iontophoresis /ml Dexamethasone;Therapeutic activities;Therapeutic exercise;Moist Heat;Manual  techniques;Patient/family education;Cryotherapy;Neuromuscular re-education;Ultrasound;Electrical Stimulation   PT Next Visit Plan electrical stimulation for pain control, meuromuscular re-education   PT Home Exercise Plan hip, knee quad sets, guleal sets, hip adduction with ball and glute sets; use of ice for pain control, elevation for control of swelling      Patient will benefit from skilled therapeutic intervention in order to improve the following deficits and impairments:  Pain, Decreased strength, Difficulty walking, Decreased range of motion, Decreased endurance, Impaired perceived functional ability, Decreased activity tolerance  Visit Diagnosis: Acute pain of right knee  Stiffness of right knee, not elsewhere classified  Difficulty in walking, not elsewhere classified  Muscle weakness (generalized)     Problem List Patient Active Problem List   Diagnosis Date Noted  . Left shoulder pain 04/24/2017  . Right knee pain 04/24/2017  . Hemorrhoids 08/02/2016  . Low back pain 05/17/2016  . Right hip pain 05/17/2016  . Chest tightness 03/01/2016  . Rash 01/31/2015  . Health care  maintenance 01/31/2015  . Left knee pain 10/12/2013  . GERD (gastroesophageal reflux disease) 12/29/2012  . Abnormal Pap smear of cervix 12/29/2012  . Essential hypertension, benign 12/25/2012  . Anemia 12/25/2012  . Degeneration of lumbar or lumbosacral intervertebral disc 12/25/2012  . Hypercholesterolemia 12/25/2012  . Vitamin D deficiency 12/25/2012  . Multiple sclerosis (HCC) 12/25/2012    [name] PT 08/16/2017, 9:40 PM  [place] [facility] PHYSICAL AND SPORTS MEDICINE [address], [place], [address] Phone: [phone]   Fax:  [phone]  Name: Jackie White MRN: [id number] Date of Birth: [birth year]

## 2017-08-20 ENCOUNTER — Encounter: Payer: Self-pay | Admitting: Physical Therapy

## 2017-08-20 ENCOUNTER — Ambulatory Visit: Payer: BC Managed Care – PPO | Attending: Orthopedic Surgery | Admitting: Physical Therapy

## 2017-08-20 DIAGNOSIS — M6281 Muscle weakness (generalized): Secondary | ICD-10-CM | POA: Insufficient documentation

## 2017-08-20 DIAGNOSIS — M25661 Stiffness of right knee, not elsewhere classified: Secondary | ICD-10-CM

## 2017-08-20 DIAGNOSIS — R262 Difficulty in walking, not elsewhere classified: Secondary | ICD-10-CM | POA: Insufficient documentation

## 2017-08-20 DIAGNOSIS — M25561 Pain in right knee: Secondary | ICD-10-CM | POA: Insufficient documentation

## 2017-08-21 NOTE — Therapy (Signed)
[facility] [address] [place], [address] Phone: [phone]   Fax:  [phone]  Physical Therapy Treatment  Patient Details  Name: Jackie White MRN: [id number] Date of Birth: [birth year] Referring Provider: Patsy Lager MD  Encounter Date: 08/20/2017      PT End of Session - 08/20/17 1910    Visit Number 14   Number of Visits 16   Date for PT Re-Evaluation 08/23/17   PT Start Time 1815   PT Stop Time 1910   PT Time Calculation (min) 55 min   Activity Tolerance Patient tolerated treatment well;Patient limited by pain   Behavior During Therapy [facility] for tasks assessed/performed      Past Medical History:  Diagnosis Date  . Abnormal Pap smear   . Cervical disc disease    Cervical and lumbar disc disease  . GERD (gastroesophageal reflux disease)   . History of iron deficiency   . Hypertension   . Multiple sclerosis (HCC)   . Personal history of colonic polyps   . Reactive airway disease   . Vitamin D deficiency     Past Surgical History:  Procedure Laterality Date  . BREAST REDUCTION SURGERY    . REDUCTION MAMMAPLASTY Bilateral 2002  . TOTAL ABDOMINAL HYSTERECTOMY  2003   Right Oophrectomy    There were no vitals filed for this visit.      Subjective Assessment - 08/20/17 1828    Subjective Patient reports incresaed pin in right knee today due to increased walking over the weekend   Pertinent History May 2018 patient reports her right knee began hurting her and caused her to limp with walking, no apparent reason. S/P right knee arthroscopy 06/21/2017 for partial lateral meniscectomy and chondroplasty   Limitations Standing;House hold activities;Walking;Sitting;Other (comment)   Diagnostic tests MRI right knee   Patient Stated Goals decrease pain and be able to walk with less difficulty and sit without pain   Currently in Pain? Yes   Pain Score 6    Pain Location Knee   Pain  Orientation Right   Pain Descriptors / Indicators Aching;Throbbing   Pain Type Surgical pain  06/21/2017   Pain Onset More than a month ago   Pain Frequency Intermittent      Objective: Gait: ambulating with SPC WBAT right LE, antalgic gait pattern, decreased weight shift to right Observation: mild to moderate swelling right knee with mild warmth distal to patella Palpation: point tenderness along lateral aspect of right knee patella  Treatment: Modalities: performed at end of session:  Electrical stimulation: 20 miinRussian stim. 10/10 cycle, intensity to tolerance, contractionapplied (2) electrodes to quadriceps/VMO right LE with patient reclined with LE supported on pillow; pt. Performing quad sets with each cycle; goal muscle re education High volt estim.clincial program for muscle spasms (2) electrodes applied to medial and lateral aspect of right kneeintensity to tolerance with patient in reclined position with LE supported on pillow goal: pain, spasms Ultrasound performed pre exercise: pulsed 50% 1.1w/cm2 x 10 min. Right knee lateral to patella, superior to patella; goal pain, improve gait  Therapeutic exercise:patient performed exercises with verbal, tactile cues and demonstration of therapist: goal: independent with home program, improve LEFS, strength Leg press 25# x 15, 45# x 15 reps short arc to avoid increased pain/strain on knee Hip rotary machine hip extension 115# right LE only x 15; abduction 40# right LE only x 15 reps Side stepping along airex balance beam x 2  min. With using UE's for support as needed Balance system for weight shift, limits of stability with platform unlocked #9: 3 skill levels x 1 reps each up to 92% accuracy  Patient response to treatment; patient able to perform exercises with modification to avoid exercises with excessive weight bearing. Improved ability to walk with less difficulty following [place] and estim.          PT Education -  08/20/17 1900    Education provided Yes   Education Details pain control, use of ice and elevation   Person(s) Educated Patient   Methods Explanation;Demonstration   Comprehension Verbalized understanding             PT Long Term Goals - 08/06/17 1552      PT LONG TERM GOAL #1   Title Patient will demonstrate improved function right LE for daily activities including sitting, standing, walking with LEFS score of 60/80    Baseline LEFS 20/80; LEFS 35/80 08/06/2017   Status On-going   Target Date 08/23/17     PT LONG TERM GOAL #2   Title Patient will be independent with home program for flexibility, strengthening and pain control  to allow transition to self management once discharged from physical therapy   Baseline limited knowledge, requires cuing and guidance for appropriate exercise and performance   Status On-going   Target Date 08/23/17     PT LONG TERM GOAL #3   Title Patient will be independent with ambulation without AD with normal gait pattern and gait speed for improved function with community ambulation   Baseline crutches, PWB right LE   Status Revised   Target Date 08/23/17     PT LONG TERM GOAL #4   Title Patient will improve AROM right knee flexion to WNL with minimal to no pain to improve transfers and stair climbing   Baseline right knee flexion 0-75; 08/06/2017 0-110/115   Status Revised   Target Date 08/23/17               Plan - 08/20/17 1920    Clinical Impression Statement Patient demonstrated decreased pain and improved gait pattern with less difficulty walking at end of session.    Rehab Potential Good   Clinical Impairments Affecting Rehab Potential (+)motivated, active lifestyle prior level of function(-)comorbidities MS, prior knee pain    PT Frequency 2x / week   PT Duration 8 weeks   PT Treatment/Interventions Iontophoresis /ml Dexamethasone;Therapeutic activities;Therapeutic exercise;Moist Heat;Manual techniques;Patient/family  education;Cryotherapy;Neuromuscular re-education;Ultrasound;Electrical Stimulation   PT Next Visit Plan electrical stimulation for pain control, meuromuscular re-education   PT Home Exercise Plan hip, knee quad sets, guleal sets, hip adduction with ball and glute sets; use of ice for pain control, elevation for control of swelling      Patient will benefit from skilled therapeutic intervention in order to improve the following deficits and impairments:  Pain, Decreased strength, Difficulty walking, Decreased range of motion, Decreased endurance, Impaired perceived functional ability, Decreased activity tolerance  Visit Diagnosis: Acute pain of right knee  Stiffness of right knee, not elsewhere classified  Difficulty in walking, not elsewhere classified  Muscle weakness (generalized)     Problem List Patient Active Problem List   Diagnosis Date Noted  . Left shoulder pain 04/24/2017  . Right knee pain 04/24/2017  . Hemorrhoids 08/02/2016  . Low back pain 05/17/2016  . Right hip pain 05/17/2016  . Chest tightness 03/01/2016  . Rash 01/31/2015  . Health care maintenance 01/31/2015  . Left knee  pain 10/12/2013  . GERD (gastroesophageal reflux disease) 12/29/2012  . Abnormal Pap smear of cervix 12/29/2012  . Essential hypertension, benign 12/25/2012  . Anemia 12/25/2012  . Degeneration of lumbar or lumbosacral intervertebral disc 12/25/2012  . Hypercholesterolemia 12/25/2012  . Vitamin D deficiency 12/25/2012  . Multiple sclerosis (HCC) 12/25/2012    [name] PT 08/21/2017, 11:42 PM  [facility] SPORTS MEDICINE [address] [place], [address] Phone: [phone]   Fax:  [phone]  Name: Jackie White MRN: [id number] Date of Birth: 1956/03/28

## 2017-08-22 ENCOUNTER — Ambulatory Visit: Payer: BC Managed Care – PPO | Admitting: Physical Therapy

## 2017-08-22 ENCOUNTER — Other Ambulatory Visit: Payer: Self-pay | Admitting: Family

## 2017-08-22 ENCOUNTER — Encounter: Payer: Self-pay | Admitting: Physical Therapy

## 2017-08-22 DIAGNOSIS — M25561 Pain in right knee: Secondary | ICD-10-CM

## 2017-08-22 DIAGNOSIS — M6281 Muscle weakness (generalized): Secondary | ICD-10-CM

## 2017-08-22 DIAGNOSIS — M25661 Stiffness of right knee, not elsewhere classified: Secondary | ICD-10-CM

## 2017-08-22 DIAGNOSIS — R262 Difficulty in walking, not elsewhere classified: Secondary | ICD-10-CM

## 2017-08-22 NOTE — Therapy (Signed)
[facility] [address], [place], [address] Phone: [phone]   Fax:  [phone]  Physical Therapy Treatment  Patient Details  Name: Jackie White MRN: [id number] Date of Birth: December 20, 1955 Referring Provider: Patsy Lager MD  Encounter Date: 08/22/2017      PT End of Session - 08/22/17 1205    Visit Number 15   Number of Visits 16   Date for PT Re-Evaluation 08/23/17   PT Start Time 1116   PT Stop Time 1205   PT Time Calculation (min) 49 min   Activity Tolerance Patient tolerated treatment well;Patient limited by pain   Behavior During Therapy [facility] for tasks assessed/performed      Past Medical History:  Diagnosis Date  . Abnormal Pap smear   . Cervical disc disease    Cervical and lumbar disc disease  . GERD (gastroesophageal reflux disease)   . History of iron deficiency   . Hypertension   . Multiple sclerosis (HCC)   . Personal history of colonic polyps   . Reactive airway disease   . Vitamin D deficiency     Past Surgical History:  Procedure Laterality Date  . BREAST REDUCTION SURGERY    . REDUCTION MAMMAPLASTY Bilateral 2002  . TOTAL ABDOMINAL HYSTERECTOMY  2003   Right Oophrectomy    There were no vitals filed for this visit.      Subjective Assessment - 08/22/17 1120    Subjective Patient with less discomfort today in right knee and feels able to exercise more   Pertinent History May 2018 patient reports her right knee began hurting her and caused her to limp with walking, no apparent reason. S/P right knee arthroscopy 06/21/2017 for partial lateral meniscectomy and chondroplasty   Limitations Standing;House hold activities;Walking;Sitting;Other (comment)   Diagnostic tests MRI right knee   Patient Stated Goals decrease pain and be able to walk with less difficulty and sit without pain   Currently in Pain? Yes   Pain Score 4    Pain Orientation Right   Pain Descriptors /  Indicators Aching   Pain Type Surgical pain  06/21/2017   Pain Onset More than a month ago   Pain Frequency Intermittent      Objective: Gait: ambulating with antalgic gait pattern, decreased weight shift to right Observation: mild to moderate swelling right knee with mild warmth distal to patella   Treatment: Modalities: performed at end of session:  Electrical stimulation: 15 miinRussian stim. 10/10 cycle, intensity to tolerance, contractionapplied (2) electrodes to quadriceps/VMO right LE with patient reclined with LE supported on pillow; pt. Performing quad sets with each cycle; goal muscle re education High volt estim.clincial program for muscle spasms (2) electrodes applied to medial and lateral aspect of right kneeintensity to tolerance with patient in reclined position with LE supported on pillow goal: pain, spasms (ice pack applied to knee with estim. No adverse effects noted)  Therapeutic exercise:patient performed exercises with verbal, tactile cues and demonstration of therapist: goal: independent with home program, improve LEFS, strength Leg press 45# x 25, 55# x 25 reps short arc to avoid increased pain/strain on knee Balance system for weight shift 1 min. Forward and back and 1 min. Side to side, limits of stability with platform unlocked #9: 3 skill levels x 1 reps each up to 93% accuracy; random control 2 min. Low skill level, slow speed with platform unlocked #8  Nustep warm up independent (unbilled time) x 10 min.  Patient response to treatment; Patient demonstrated improved technique and strength as noted with increased intensity and ability to perform exercises today. She reported soreness in her knee throughout session, no worse at end of session.        PT Education - 08/22/17 1508    Education provided Yes   Education Details exercise instruction, technique, positioning   Person(s) Educated Patient   Methods Explanation;Demonstration;Verbal cues    Comprehension Verbalized understanding;Returned demonstration;Verbal cues required             PT Long Term Goals - 08/06/17 1552      PT LONG TERM GOAL #1   Title Patient will demonstrate improved function right LE for daily activities including sitting, standing, walking with LEFS score of 60/80    Baseline LEFS 20/80; LEFS 35/80 08/06/2017   Status On-going   Target Date 08/23/17     PT LONG TERM GOAL #2   Title Patient will be independent with home program for flexibility, strengthening and pain control  to allow transition to self management once discharged from physical therapy   Baseline limited knowledge, requires cuing and guidance for appropriate exercise and performance   Status On-going   Target Date 08/23/17     PT LONG TERM GOAL #3   Title Patient will be independent with ambulation without AD with normal gait pattern and gait speed for improved function with community ambulation   Baseline crutches, PWB right LE   Status Revised   Target Date 08/23/17     PT LONG TERM GOAL #4   Title Patient will improve AROM right knee flexion to WNL with minimal to no pain to improve transfers and stair climbing   Baseline right knee flexion 0-75; 08/06/2017 0-110/115   Status Revised   Target Date 08/23/17               Plan - 08/22/17 1300    Clinical Impression Statement Patient is progressing slowly and steadily towards goals. she continues with limtations of pain, stiffness in right knee and decreased strength which limits her abiltiy to walk and perform daily tasks without difficulty.   Rehab Potential Good   Clinical Impairments Affecting Rehab Potential (+)motivated, active lifestyle prior level of function(-)comorbidities MS, prior knee pain    PT Frequency 2x / week   PT Duration 8 weeks   PT Treatment/Interventions Iontophoresis 4mg /ml Dexamethasone;Therapeutic activities;Therapeutic exercise;Moist Heat;Manual techniques;Patient/family  education;Cryotherapy;Neuromuscular re-education;Ultrasound;Electrical Stimulation   PT Next Visit Plan electrical stimulation for pain control, meuromuscular re-education   PT Home Exercise Plan hip, knee quad sets, guleal sets, hip adduction with ball and glute sets; use of ice for pain control, elevation for control of swelling      Patient will benefit from skilled therapeutic intervention in order to improve the following deficits and impairments:  Pain, Decreased strength, Difficulty walking, Decreased range of motion, Decreased endurance, Impaired perceived functional ability, Decreased activity tolerance  Visit Diagnosis: Acute pain of right knee  Stiffness of right knee, not elsewhere classified  Difficulty in walking, not elsewhere classified  Muscle weakness (generalized)     Problem List Patient Active Problem List   Diagnosis Date Noted  . Left shoulder pain 04/24/2017  . Right knee pain 04/24/2017  . Hemorrhoids 08/02/2016  . Low back pain 05/17/2016  . Right hip pain 05/17/2016  . Chest tightness 03/01/2016  . Rash 01/31/2015  . Health care maintenance 01/31/2015  . Left knee pain 10/12/2013  . GERD (gastroesophageal reflux disease) 12/29/2012  .  Abnormal Pap smear of cervix 12/29/2012  . Essential hypertension, benign 12/25/2012  . Anemia 12/25/2012  . Degeneration of lumbar or lumbosacral intervertebral disc 12/25/2012  . Hypercholesterolemia 12/25/2012  . Vitamin D deficiency 12/25/2012  . Multiple sclerosis (HCC) 12/25/2012    [name] PT 08/23/2017, 3:12 PM  [place] [facility] PH[facility] SPORTS MEDICINE [address] [place], [address] Phone: [phone]   Fax:  [phone]  Name: JAZZMYN FILION MRN: [id number] Date of Birth: Nov 23, 1955

## 2017-08-27 ENCOUNTER — Ambulatory Visit: Payer: BC Managed Care – PPO | Admitting: Physical Therapy

## 2017-08-27 ENCOUNTER — Encounter: Payer: Self-pay | Admitting: Physical Therapy

## 2017-08-27 DIAGNOSIS — M25561 Pain in right knee: Secondary | ICD-10-CM | POA: Diagnosis not present

## 2017-08-27 DIAGNOSIS — R262 Difficulty in walking, not elsewhere classified: Secondary | ICD-10-CM

## 2017-08-27 DIAGNOSIS — M25661 Stiffness of right knee, not elsewhere classified: Secondary | ICD-10-CM

## 2017-08-27 DIAGNOSIS — M6281 Muscle weakness (generalized): Secondary | ICD-10-CM

## 2017-08-27 NOTE — Therapy (Signed)
[facility] [address], [place], [address] Phone: [phone]   Fax:  [phone]  Physical Therapy Treatment  Patient Details  Name: Jackie White MRN: [id number] Date of Birth: 1955/12/13 Referring Provider: Patsy Lager MD  Encounter Date: 08/27/2017      PT End of Session - 08/27/17 1357    Visit Number 16   Number of Visits 32   Date for PT Re-Evaluation 10/22/17   PT Start Time 1345   PT Stop Time 1440   PT Time Calculation (min) 55 min   Activity Tolerance Patient tolerated treatment well;Patient limited by pain   Behavior During Therapy [facility] for tasks assessed/performed      Past Medical History:  Diagnosis Date  . Abnormal Pap smear   . Cervical disc disease    Cervical and lumbar disc disease  . GERD (gastroesophageal reflux disease)   . History of iron deficiency   . Hypertension   . Multiple sclerosis (HCC)   . Personal history of colonic polyps   . Reactive airway disease   . Vitamin D deficiency     Past Surgical History:  Procedure Laterality Date  . BREAST REDUCTION SURGERY    . REDUCTION MAMMAPLASTY Bilateral 2002  . TOTAL ABDOMINAL HYSTERECTOMY  2003   Right Oophrectomy    There were no vitals filed for this visit.      Subjective Assessment - 08/27/17 1354    Subjective Patient reports pain in right knee mild with less warmth noted. She is eager to see more improvement with strength and function.   Pertinent History May 2018 patient reports her right knee began hurting her and caused her to limp with walking, no apparent reason. S/P right knee arthroscopy 06/21/2017 for partial lateral meniscectomy and chondroplasty   Limitations Standing;House hold activities;Walking;Sitting;Other (comment)   Diagnostic tests MRI right knee   Patient Stated Goals decrease pain and be able to walk with less difficulty and sit without pain   Currently in Pain? Yes   Pain Score 3     Pain Location Knee   Pain Orientation Right   Pain Descriptors / Indicators Aching   Pain Type Surgical pain  06/21/2017   Pain Onset More than a month ago   Pain Frequency Intermittent        Objective: Gait: ambulating with antalgic gait pattern, decreased weight shift to right, using SPC for balance Observation: mild to moderate swelling right knee with mild warmth distal to patella Outcome measure: AROM right knee flexion; 0- 115/117  Strength; right hip flexion 4-/5, abduction, extension, ER 4-/5, right knee extension/5, flexion 4-/5   Treatment: Modalities: performed at end of session:  Electrical stimulation: 15 miinRussian stim. 10/10 cycle, intensity to tolerance, contractionapplied (2) electrodes to quadriceps/VMO right LE with patient reclined with LE supported on pillow; pt. Performing quad sets with each cycle; goal muscle re education High volt estim.clincial program for muscle spasms (2) electrodes applied to medial and lateral aspect of right kneeintensity to tolerance with patient in reclined position with LE supported on pillow goal: pain, spasms (ice pack applied to knee with estim. No adverse effects noted)  Therapeutic exercise:patient performed exercises with verbal, tactile cues and demonstration of therapist: goal: independent with home program, improve LEFS, strength Leg press 55# x 15, 65# x 15 reps short arc to avoid increased pain/strain on knee Balance system, limits of stability with platform unlocked #8: 3 skill levels x 1 reps each  up to 96% (was 93%) accuracy; random control 2 min. High skill level, slow speed with platform unlocked #7 Standing on balance pad staggered stance ball toss x 10-15 reps  Nustep warm up independent  Level#4 (unbilled time) x 10 min.  Patient response to treatment; Patient demonstrated good technique with minimal cuing of therapist for correct positioning, alignment of LE. Improved motor control noted with balance  exercises with repetition.         PT Education - 08/27/17 1430    Education provided Yes   Education Details Exercise instruction   Person(s) Educated Patient   Methods Explanation;Demonstration;Verbal cues   Comprehension Verbalized understanding;Returned demonstration;Verbal cues required             PT Long Term Goals - 08/27/17 1459      PT LONG TERM GOAL #1   Title Patient will demonstrate improved function right LE for daily activities including sitting, standing, walking with LEFS score of 60/80    Baseline LEFS 20/80; LEFS 35/80 08/06/2017; LEFS 08/23/17 40/80   Status Revised   Target Date 10/22/17     PT LONG TERM GOAL #2   Title Patient will be independent with home program for flexibility, strengthening and pain control  to allow transition to self management once discharged from physical therapy   Baseline limited knowledge, requires cuing and guidance for appropriate exercise and performance   Status Revised   Target Date 10/22/17     PT LONG TERM GOAL #3   Title Patient will be independent with ambulation without AD with normal gait pattern and gait speed for improved function with community ambulation   Baseline crutches, PWB right LE   Status Revised   Target Date 10/22/17     PT LONG TERM GOAL #4   Title Patient will improve AROM right knee flexion to WNL with minimal to no pain to improve transfers and stair climbing   Baseline right knee flexion 0-75; 08/06/2017 0-110/115; 08/27/17 0-115/117   Status Revised   Target Date 10/22/17               Plan - 08/27/17 1440    Clinical Impression Statement Patient is progressing slowly with ROM, strength and funciton s/p right knee arthroscopy 06/21/2017. She is steadily progressing with goals and requires guidance and cuing to perform appropriate exercises. She will benefit from conintued physical therapy intervention in order to address limitations and achieve goals.    Rehab Potential Good   Clinical  Impairments Affecting Rehab Potential (+)motivated, active lifestyle prior level of function(-)comorbidities MS, prior knee pain    PT Frequency 2x / week   PT Duration 8 weeks   PT Treatment/Interventions Iontophoresis 4mg /ml Dexamethasone;Therapeutic activities;Therapeutic exercise;Moist Heat;Manual techniques;Patient/family education;Cryotherapy;Neuromuscular re-education;Ultrasound;Electrical Stimulation   PT Next Visit Plan electrical stimulation for pain control, meuromuscular re-education   PT Home Exercise Plan hip, knee quad sets, guleal sets, hip adduction with ball and glute sets; use of ice for pain control, elevation for control of swelling   Consulted and Agree with Plan of Care Patient      Patient will benefit from skilled therapeutic intervention in order to improve the following deficits and impairments:  Pain, Decreased strength, Difficulty walking, Decreased range of motion, Decreased endurance, Impaired perceived functional ability, Decreased activity tolerance  Visit Diagnosis: Acute pain of right knee - Plan: PT plan of care cert/re-cert  Stiffness of right knee, not elsewhere classified - Plan: PT plan of care cert/re-cert  Difficulty in walking, not elsewhere classified -  Plan: PT plan of care cert/re-cert  Muscle weakness (generalized) - Plan: PT plan of care cert/re-cert     Problem List Patient Active Problem List   Diagnosis Date Noted  . Left shoulder pain 04/24/2017  . Right knee pain 04/24/2017  . Hemorrhoids 08/02/2016  . Low back pain 05/17/2016  . Right hip pain 05/17/2016  . Chest tightness 03/01/2016  . Rash 01/31/2015  . Health care maintenance 01/31/2015  . Left knee pain 10/12/2013  . GERD (gastroesophageal reflux disease) 12/29/2012  . Abnormal Pap smear of cervix 12/29/2012  . Essential hypertension, benign 12/25/2012  . Anemia 12/25/2012  . Degeneration of lumbar or lumbosacral intervertebral disc 12/25/2012  . Hypercholesterolemia  12/25/2012  . Vitamin D deficiency 12/25/2012  . Multiple sclerosis (HCC) 12/25/2012    [name] PT 08/28/2017, 3:06 PM   [facility] PHYSICAL AND SPORTS MEDICINE [address] [place], [address] Phone: [phone]   Fax:  [phone]  Name: Jackie White MRN: [id number] Date of Birth: [birth year]

## 2017-08-29 ENCOUNTER — Encounter: Payer: Self-pay | Admitting: Physical Therapy

## 2017-08-29 ENCOUNTER — Ambulatory Visit: Payer: BC Managed Care – PPO | Admitting: Physical Therapy

## 2017-08-29 DIAGNOSIS — M25661 Stiffness of right knee, not elsewhere classified: Secondary | ICD-10-CM

## 2017-08-29 DIAGNOSIS — M6281 Muscle weakness (generalized): Secondary | ICD-10-CM

## 2017-08-29 DIAGNOSIS — M25561 Pain in right knee: Secondary | ICD-10-CM | POA: Diagnosis not present

## 2017-08-29 DIAGNOSIS — R262 Difficulty in walking, not elsewhere classified: Secondary | ICD-10-CM

## 2017-08-29 NOTE — Therapy (Signed)
[facility]IC[facility] [address] [place], [address] Phone: [phone]   Fax:  [phone]  Physical Therapy Treatment  Patient Details  Name: Jackie White MRN: [id number] Date of Birth: [birth year] Referring Provider: Patsy Lager MD  Encounter Date: 08/29/2017      PT End of Session - 08/29/17 1834    Visit Number 17   Number of Visits 32   Date for PT Re-Evaluation 10/22/17   PT Start Time 1820   PT Stop Time 1915   PT Time Calculation (min) 55 min   Activity Tolerance Patient tolerated treatment well;Patient limited by pain   Behavior During Therapy [facility] for tasks assessed/performed      Past Medical History:  Diagnosis Date  . Abnormal Pap smear   . Cervical disc disease    Cervical and lumbar disc disease  . GERD (gastroesophageal reflux disease)   . History of iron deficiency   . Hypertension   . Multiple sclerosis (HCC)   . Personal history of colonic polyps   . Reactive airway disease   . Vitamin D deficiency     Past Surgical History:  Procedure Laterality Date  . BREAST REDUCTION SURGERY    . REDUCTION MAMMAPLASTY Bilateral 2002  . TOTAL ABDOMINAL HYSTERECTOMY  2003   Right Oophrectomy    There were no vitals filed for this visit.      Subjective Assessment - 08/29/17 1833    Subjective Patient reports better day today with less pain right knee and less warmth. She continues to voice frustration with wanting to be back to normal.    Pertinent History May 2018 patient reports her right knee began hurting her and caused her to limp with walking, no apparent reason. S/P right knee arthroscopy 06/21/2017 for partial lateral meniscectomy and chondroplasty   Limitations Standing;House hold activities;Walking;Sitting;Other (comment)   Diagnostic tests MRI right knee   Patient Stated Goals decrease pain and be able to walk with less difficulty and sit without pain   Currently in Pain?  Yes   Pain Score 3    Pain Location Knee   Pain Orientation Right   Pain Descriptors / Indicators Aching   Pain Type Surgical pain  06/21/2017   Pain Onset More than a month ago   Pain Frequency Intermittent      Objective: Gait: ambulating with mild antalgic gait pattern, decreased weight shift to right, using SPC for balance intermittently Observation: mild swelling right knee with very mild warmth distal to patella  Treatment: Modalities: performed at end of session:  Electrical stimulation: stim. 10/10 cycle, intensity to tolerance, contractionapplied (2) electrodes to quadriceps/VMO right LE with patient reclined with LE supported on pillow; pt. Performing quad sets with each cycle; goal muscle re education High volt estim.clincial program for muscle spasms (2) electrodes applied to medial and lateral aspect of right kneeintensity to tolerance with patient in reclined position with LE supported on pillow goal: pain, spasms (ice pack applied to knee with estim. No adverse effects noted)  Therapeutic exercise:patient performed exercises with verbal, tactile cues and demonstration of therapist: goal: independent with home program, improve LEFS, strength Leg press 55# x 15, 65# x 15 reps short arc to avoid increased pain/strain on knee Balance system, limits of stability with platform unlocked #8: 3 skill levels x 1 reps each up to 97%  accuracy; random control 2 min. High skill level, slow speed with platform unlocked #7 Standing balance pad step  ups leading with right LE x 10 then left LE x 10 Lateral step up/overs x 10 reps on balance pad  Nustep warm up independent  Level#4 (unbilled time) x 10 min.  Patient response to treatment; Patient required minimal cuing to perform exercises with correct technique. Improved motor control with repetition with balance exercises. mild increased soreness in right knee with exercises.          PT Education - 08/29/17  1905    Education provided Yes   Education Details exercise instruction   Person(s) Educated Patient   Methods Explanation;Demonstration;Verbal cues   Comprehension Returned demonstration;Verbal cues required;Verbalized understanding             PT Long Term Goals - 08/27/17 1459      PT LONG TERM GOAL #1   Title Patient will demonstrate improved function right LE for daily activities including sitting, standing, walking with LEFS score of 60/80    Baseline LEFS 20/80; LEFS 35/80 08/06/2017; LEFS 08/23/17 40/80   Status Revised   Target Date 10/22/17     PT LONG TERM GOAL #2   Title Patient will be independent with home program for flexibility, strengthening and pain control  to allow transition to self management once discharged from physical therapy   Baseline limited knowledge, requires cuing and guidance for appropriate exercise and performance   Status Revised   Target Date 10/22/17     PT LONG TERM GOAL #3   Title Patient will be independent with ambulation without AD with normal gait pattern and gait speed for improved function with community ambulation   Baseline crutches, PWB right LE   Status Revised   Target Date 10/22/17     PT LONG TERM GOAL #4   Title Patient will improve AROM right knee flexion to WNL with minimal to no pain to improve transfers and stair climbing   Baseline right knee flexion 0-75; 08/06/2017 0-110/115; 08/27/17 0-115/117   Status Revised   Target Date 10/22/17               Plan - 08/29/17 1835    Clinical Impression Statement Patient is progressing slowly with  ROM and strength as she heals from surgery 06/21/2017. She is noticing less swelling and warmth and is progressing with exercise intensity with mild to moderate increased soreness in her knee. She will require continued physical therapy intervention for pain control, progressive strengthening exercises in order to improve function.   Rehab Potential Good   Clinical Impairments  Affecting Rehab Potential (+)motivated, active lifestyle prior level of function(-)comorbidities MS, prior knee pain    PT Frequency 2x / week   PT Duration 8 weeks   PT Treatment/Interventions Iontophoresis /ml Dexamethasone;Therapeutic activities;Therapeutic exercise;Moist Heat;Manual techniques;Patient/family education;Cryotherapy;Neuromuscular re-education;Ultrasound;Electrical Stimulation   PT Next Visit Plan electrical stimulation for pain control, meuromuscular re-education   PT Home Exercise Plan hip, knee quad sets, guleal sets, hip adduction with ball and glute sets; use of ice for pain control, elevation for control of swelling      Patient will benefit from skilled therapeutic intervention in order to improve the following deficits and impairments:  Pain, Decreased strength, Difficulty walking, Decreased range of motion, Decreased endurance, Impaired perceived functional ability, Decreased activity tolerance  Visit Diagnosis: Acute pain of right knee  Stiffness of right knee, not elsewhere classified  Difficulty in walking, not elsewhere classified  Muscle weakness (generalized)     Problem List Patient Active Problem List   Diagnosis Date Noted  .  Left shoulder pain 04/24/2017  . Right knee pain 04/24/2017  . Hemorrhoids 08/02/2016  . Low back pain 05/17/2016  . Right hip pain 05/17/2016  . Chest tightness 03/01/2016  . Rash 01/31/2015  . Health care maintenance 01/31/2015  . Left knee pain 10/12/2013  . GERD (gastroesophageal reflux disease) 12/29/2012  . Abnormal Pap smear of cervix 12/29/2012  . Essential hypertension, benign 12/25/2012  . Anemia 12/25/2012  . Degeneration of lumbar or lumbosacral intervertebral disc 12/25/2012  . Hypercholesterolemia 12/25/2012  . Vitamin D deficiency 12/25/2012  . Multiple sclerosis (HCC) 12/25/2012    [name] PT 08/30/2017, 12:58 PM  [facility] PHYSICAL AND SPORTS  MEDICINE [address], [place], [address] Phone: [phone]   Fax:  [phone]  Name: Jackie White MRN: [id number] Date of Birth: Dec 19, 1955

## 2017-08-30 ENCOUNTER — Ambulatory Visit (INDEPENDENT_AMBULATORY_CARE_PROVIDER_SITE_OTHER): Payer: BC Managed Care – PPO | Admitting: Internal Medicine

## 2017-08-30 ENCOUNTER — Encounter: Payer: Self-pay | Admitting: Internal Medicine

## 2017-08-30 ENCOUNTER — Other Ambulatory Visit (HOSPITAL_COMMUNITY)
Admission: RE | Admit: 2017-08-30 | Discharge: 2017-08-30 | Disposition: A | Discharge status: Home / Self Care | Payer: BC Managed Care – PPO | Source: Ambulatory Visit | Attending: Internal Medicine | Admitting: Internal Medicine

## 2017-08-30 VITALS — BP 120/74 | HR 70 | Temp 98.4°F | Resp 14 | Ht 67.0 in | Wt 228.2 lb

## 2017-08-30 DIAGNOSIS — D649 Anemia, unspecified: Secondary | ICD-10-CM | POA: Diagnosis not present

## 2017-08-30 DIAGNOSIS — Z124 Encounter for screening for malignant neoplasm of cervix: Secondary | ICD-10-CM | POA: Diagnosis not present

## 2017-08-30 DIAGNOSIS — Z0000 Encounter for general adult medical examination without abnormal findings: Secondary | ICD-10-CM | POA: Diagnosis not present

## 2017-08-30 DIAGNOSIS — Z1231 Encounter for screening mammogram for malignant neoplasm of breast: Secondary | ICD-10-CM | POA: Diagnosis not present

## 2017-08-30 DIAGNOSIS — E7800 Pure hypercholesterolemia, unspecified: Secondary | ICD-10-CM

## 2017-08-30 DIAGNOSIS — I10 Essential (primary) hypertension: Secondary | ICD-10-CM | POA: Diagnosis not present

## 2017-08-30 DIAGNOSIS — G35 Multiple sclerosis: Secondary | ICD-10-CM | POA: Diagnosis not present

## 2017-08-30 DIAGNOSIS — M25561 Pain in right knee: Secondary | ICD-10-CM

## 2017-08-30 DIAGNOSIS — E559 Vitamin D deficiency, unspecified: Secondary | ICD-10-CM

## 2017-08-30 DIAGNOSIS — Z1239 Encounter for other screening for malignant neoplasm of breast: Secondary | ICD-10-CM

## 2017-08-30 NOTE — Progress Notes (Signed)
Patient ID: Jackie White, female   DOB: [birth year], 61 y.o.   MRN: [id number]   Subjective:    Patient ID: Jackie White, female    DOB: [birth year], 61 y.o.   MRN: [id number]  HPI  Patient here for her physical exam.  She reports she is doing relatively well.  She is s/p right knee arthroscopic partial laterql meniscectomy and chondroplasty of lateral femoral condyle on 06/21/17.  Going to therapy.  Doing well.  Also has MS.  Follow at [facility] - Dr Elwyn Reach. Last seen 07/10/17.  On ocrelizumab.  Stable.  She tries to stay active.  No chest pain.  No sob.  No acid reflux.  No abdominal pain.  Bowels moving.     Past Medical History:  Diagnosis Date  . Abnormal Pap smear   . Cervical disc disease    Cervical and lumbar disc disease  . GERD (gastroesophageal reflux disease)   . History of iron deficiency   . Hypertension   . Multiple sclerosis (HCC)   . Personal history of colonic polyps   . Reactive airway disease   . Vitamin D deficiency    Past Surgical History:  Procedure Laterality Date  . BREAST REDUCTION SURGERY    . REDUCTION MAMMAPLASTY Bilateral 2002  . TOTAL ABDOMINAL HYSTERECTOMY  2003   Right Oophrectomy   Family History  Problem Relation Age of Onset  . Stroke Mother   . Hypertension Mother   . Heart disease Maternal Grandmother   . Breast cancer Neg Hx    Social History   Social History  . Marital status: Married    Spouse name: N/A  . Number of children: N/A  . Years of education: N/A   Social History Main Topics  . Smoking status: Never Smoker  . Smokeless tobacco: Never Used  . Alcohol use 0.0 oz/week  . Drug use: No  . Sexual activity: Not Asked   Other Topics Concern  . None   Social History Narrative  . None    Outpatient Encounter Prescriptions as of 08/30/2017  Medication Sig  . metoprolol succinate (TOPROL-XL) 25 MG 24 hr tablet TAKE 1 TABLET(25 MG) BY MOUTH DAILY  . Ocrelizumab (OCREVUS IV) Inject into the vein.  .  valsartan-hydrochlorothiazide (DIOVAN-HCT) 320-25 MG tablet TAKE 1 TABLET BY MOUTH DAILY  . VITAMIN D, ERGOCALCIFEROL, PO Take by mouth.  . [DISCONTINUED] aspirin EC 81 MG tablet Take 81 mg by mouth daily.  . [DISCONTINUED] oxyCODONE (OXY IR/ROXICODONE) 5 MG immediate release tablet Take 5 mg by mouth every 4 (four) hours as needed for severe pain.   No facility-administered encounter medications on file as of 08/30/2017.     Review of Systems  Constitutional: Negative for appetite change and unexpected weight change.  HENT: Negative for congestion and sinus pressure.   Eyes: Negative for pain and visual disturbance.  Respiratory: Negative for cough, chest tightness and shortness of breath.   Cardiovascular: Negative for chest pain, palpitations and leg swelling.  Gastrointestinal: Negative for abdominal pain, diarrhea, nausea and vomiting.  Genitourinary: Negative for difficulty urinating and dysuria.  Musculoskeletal: Negative for joint swelling and myalgias.       S/p knee surgery as outlined.  Going to therapy    Skin: Negative for color change and rash.  Neurological: Negative for dizziness, light-headedness and headaches.  Hematological: Negative for adenopathy. Does not bruise/bleed easily.  Psychiatric/Behavioral: Negative for agitation and dysphoric mood.       Objective:    Physical  Exam  Constitutional: She is oriented to person, place, and time. She appears well-developed and well-nourished. No distress.  HENT:  Nose: Nose normal.  Mouth/Throat: Oropharynx is clear and moist.  Eyes: Right eye exhibits no discharge. Left eye exhibits no discharge. No scleral icterus.  Neck: Neck supple. No thyromegaly present.  Cardiovascular: Normal rate and regular rhythm.   Pulmonary/Chest: Breath sounds normal. No accessory muscle usage. No tachypnea. No respiratory distress. She has no decreased breath sounds. She has no wheezes. She has no rhonchi. Right breast exhibits no inverted  nipple, no mass, no nipple discharge and no tenderness (no axillary adenopathy). Left breast exhibits no inverted nipple, no mass, no nipple discharge and no tenderness (no axilarry adenopathy).  Abdominal: Soft. Bowel sounds are normal. There is no tenderness.  Genitourinary:  Genitourinary Comments: Normal external genitalia.  Vaginal vault without lesions.  S/p hysterectomy.  Pap smear performed.  Could not appreciate any adnexal masses or tenderness.    Musculoskeletal: She exhibits no edema or tenderness.  Lymphadenopathy:    She has no cervical adenopathy.  Neurological: She is alert and oriented to person, place, and time.  Skin: Skin is warm. No rash noted. No erythema.  Psychiatric: She has a normal mood and affect. Her behavior is normal.    BP 120/74 (BP Location: Left Arm, Patient Position: Sitting, Cuff Size: Normal)   Pulse 70   Temp 98.4 F (36.9 C) (Oral)   Resp 14   Ht 5\' 7"  (1.702 m)   Wt 228 lb 3.2 oz (103.5 kg)   LMP 12/27/2000   SpO2 99%   BMI 35.74 kg/m  Wt Readings from Last 3 Encounters:  08/30/17 228 lb 3.2 oz (103.5 kg)  04/24/17 224 lb 9.6 oz (101.9 kg)  02/13/17 233 lb 14.4 oz (106.1 kg)     Lab Results  Component Value Date   WBC 4.2 03/01/2016   HGB 13.5 03/01/2016   HCT 41.0 03/01/2016   PLT 283 03/01/2016   GLUCOSE 94 04/24/2017   CHOL 168 09/15/2016   TRIG 59.0 09/15/2016   HDL 79.00 09/15/2016   LDLCALC 77 09/15/2016   ALT 13 09/15/2016   AST 14 09/15/2016   NA 139 04/24/2017   K 4.1 04/24/2017   CL 101 04/24/2017   CREATININE 0.83 04/24/2017   BUN 11 04/24/2017   CO2 32 04/24/2017   TSH 1.00 01/28/2016    Mm Screening Breast Tomo Bilateral  Result Date: 09/18/2016 CLINICAL DATA:  Screening. EXAM: 2D DIGITAL SCREENING BILATERAL MAMMOGRAM WITH CAD AND ADJUNCT TOMO COMPARISON:  Previous exam(s). ACR Breast Density Category a: The breast tissue is almost entirely fatty. FINDINGS: There are no findings suspicious for malignancy.  Images were processed with CAD. IMPRESSION: No mammographic evidence of malignancy. A result letter of this screening mammogram will be mailed directly to the patient. RECOMMENDATION: Screening mammogram in one year. (Code:SM-B-01Y) BI-RADS CATEGORY  1: Negative. Electronically Signed   By: Britta Mccreedy M.D.   On: 09/18/2016 16:29       Assessment & Plan:   Problem List Items Addressed This Visit    Anemia    Has a documented history of iron deficient anemia.  Recheck cbc and ferritin.        Relevant Orders   Ferritin   Essential hypertension, benign    Controlled on current regimen.  Follow.        Relevant Orders   CBC with Differential/Platelet   Health care maintenance    Physical today  08/30/17.  PAP 06/15/15 - negative with negative HPV.  Mammogram 09/18/16 - Birads I.  Schedule f/u mammogram.  Colonoscopy 2015.  PAP 08/30/17.        Hypercholesterolemia    Low cholesterol diet and exercise.  Follow lipid panel.        Relevant Orders   Hepatic function panel   Lipid panel   Multiple sclerosis (HCC)    Followed by neurology.  Stable.        Relevant Orders   TSH   Basic metabolic panel   Right knee pain    S/p right knee surgery as outlined.  Continue therapy.        Vitamin D deficiency    Follow vitamin D level.         Relevant Orders   VITAMIN D 25 Hydroxy (Vit-D Deficiency, Fractures)    Other Visit Diagnoses    Routine general medical examination at a health care facility    -  Primary   Screening for breast cancer       Relevant Orders   MM DIGITAL SCREENING BILATERAL   Screening for cervical cancer       Relevant Orders   Cytology - PAP (Completed)       Dale Perrinton, MD

## 2017-08-30 NOTE — Assessment & Plan Note (Addendum)
Physical today 08/30/17.  PAP 06/15/15 - negative with negative HPV.  Mammogram 09/18/16 - Birads I.  Schedule f/u mammogram.  Colonoscopy 2015.  PAP 08/30/17.

## 2017-08-31 ENCOUNTER — Encounter: Payer: Self-pay | Admitting: Internal Medicine

## 2017-08-31 LAB — CYTOLOGY - PAP
Diagnosis: NEGATIVE
HPV (WINDOPATH): NOT DETECTED

## 2017-09-02 ENCOUNTER — Encounter: Payer: Self-pay | Admitting: Internal Medicine

## 2017-09-02 NOTE — Assessment & Plan Note (Signed)
Low cholesterol diet and exercise.  Follow lipid panel.   

## 2017-09-02 NOTE — Assessment & Plan Note (Signed)
Controlled on current regimen.  Follow.  

## 2017-09-02 NOTE — Assessment & Plan Note (Signed)
S/p right knee surgery as outlined.  Continue therapy.

## 2017-09-02 NOTE — Assessment & Plan Note (Signed)
Followed by neurology.  Stable  

## 2017-09-02 NOTE — Assessment & Plan Note (Signed)
Has a documented history of iron deficient anemia.  Recheck cbc and ferritin.

## 2017-09-02 NOTE — Assessment & Plan Note (Signed)
Follow vitamin D level.  

## 2017-09-03 ENCOUNTER — Ambulatory Visit: Payer: BC Managed Care – PPO | Admitting: Physical Therapy

## 2017-09-03 ENCOUNTER — Encounter: Payer: Self-pay | Admitting: Physical Therapy

## 2017-09-03 DIAGNOSIS — R262 Difficulty in walking, not elsewhere classified: Secondary | ICD-10-CM

## 2017-09-03 DIAGNOSIS — M25561 Pain in right knee: Secondary | ICD-10-CM | POA: Diagnosis not present

## 2017-09-03 DIAGNOSIS — M6281 Muscle weakness (generalized): Secondary | ICD-10-CM

## 2017-09-03 DIAGNOSIS — M25661 Stiffness of right knee, not elsewhere classified: Secondary | ICD-10-CM

## 2017-09-03 NOTE — Therapy (Signed)
[facility] [address] [place], [address] Phone: [phone]   Fax:  [phone]  Physical Therapy Treatment  Patient Details  Name: Jackie White MRN: [id number] Date of Birth: 1956-07-29 Referring Provider: Patsy Lager MD  Encounter Date: 09/03/2017      PT End of Session - 09/03/17 1912    Visit Number 18   Number of Visits 32   Date for PT Re-Evaluation 10/22/17   PT Start Time 1809   PT Stop Time 1908   PT Time Calculation (min) 59 min   Activity Tolerance Patient tolerated treatment well;Patient limited by pain   Behavior During Therapy [facility] for tasks assessed/performed      Past Medical History:  Diagnosis Date  . Abnormal Pap smear   . Cervical disc disease    Cervical and lumbar disc disease  . GERD (gastroesophageal reflux disease)   . History of iron deficiency   . Hypertension   . Multiple sclerosis (HCC)   . Personal history of colonic polyps   . Reactive airway disease   . Vitamin D deficiency     Past Surgical History:  Procedure Laterality Date  . BREAST REDUCTION SURGERY    . REDUCTION MAMMAPLASTY Bilateral 2002  . TOTAL ABDOMINAL HYSTERECTOMY  2003   Right Oophrectomy    There were no vitals filed for this visit.      Subjective Assessment - 09/03/17 1820    Subjective Patient  report  still having difficulty with sleeping and knee is not as hot or painful today. She is still feeling frustrated with slow healing.   Pertinent History May 2018 patient reports her right knee began hurting her and caused her to limp with walking, no apparent reason. S/P right knee arthroscopy 06/21/2017 for partial lateral meniscectomy and chondroplasty   Limitations Standing;House hold activities;Walking;Sitting;Other (comment)   Diagnostic tests MRI right knee   Patient Stated Goals decrease pain and be able to walk with less difficulty and sit without pain   Currently in Pain?  Yes   Pain Score 3    Pain Location Knee   Pain Orientation Right;Posterior   Pain Descriptors / Indicators Sore;Tightness   Pain Type Surgical pain  06/21/2017   Pain Onset More than a month ago   Pain Frequency Intermittent      Objective: Gait: ambulating with mild antalgic gait pattern, decreased weight shift to right, using SPC for balance intermittently Observation: mild swelling right knee with no significant warmth noted AAROM right knee:120 degrees flexion   Treatment: Manual therapy: 8 min. Goal: pain, improve soft tissue elasticity  Right knee posterior aspect superficial techniques with patient prone lying; patella mobilization with mobilizer  Improved knee extension and ability to walk with much less pain in back of knee at end of session  Modalities: performed at end of session:  Electrical stimulation: stim. 10/10 cycle, intensity to tolerance, contractionapplied (2) electrodes to quadriceps/VMO right LE with patient reclined with LE supported on pillow; pt. Performing quad sets with each cycle; goal muscle re education High volt estim.clincial program for muscle spasms (2) electrodes applied to posteiror  aspect of right knee above and below kneeintensity to tolerance with patient in reclined position with LE supported on pillow goal: pain, spasms   Therapeutic exercise:patient performed exercises with verbal, tactile cues and demonstration of therapist: goal: independent with home program, improve LEFS, strength Leg press 55# x 15, 65# x 15 reps short arc to  avoid increased pain/strain on knee Standing  step ups onto 4" step leading with right LE x 10 then left LE x 10 Lateral step up/overs x 10 reps on 4" step  Nustep warm up independent Level#4 (unbilled time) x 10 min.  Patient response to treatment; Patient demonstrated improved technique with improved strength with exercises on step. Minimal cuing required for all exercises. improved gait  pattern with much less pain in posterior aspect of right knee at end of session.            PT Education - 09/03/17 1831    Education provided Yes   Education Details exercise instruction   Person(s) Educated Patient   Methods Explanation;Demonstration;Verbal cues   Comprehension Verbalized understanding;Returned demonstration;Verbal cues required             PT Long Term Goals - 08/27/17 1459      PT LONG TERM GOAL #1   Title Patient will demonstrate improved function right LE for daily activities including sitting, standing, walking with LEFS score of 60/80    Baseline LEFS 20/80; LEFS 35/80 08/06/2017; LEFS 08/23/17 40/80   Status Revised   Target Date 10/22/17     PT LONG TERM GOAL #2   Title Patient will be independent with home program for flexibility, strengthening and pain control  to allow transition to self management once discharged from physical therapy   Baseline limited knowledge, requires cuing and guidance for appropriate exercise and performance   Status Revised   Target Date 10/22/17     PT LONG TERM GOAL #3   Title Patient will be independent with ambulation without AD with normal gait pattern and gait speed for improved function with community ambulation   Baseline crutches, PWB right LE   Status Revised   Target Date 10/22/17     PT LONG TERM GOAL #4   Title Patient will improve AROM right knee flexion to WNL with minimal to no pain to improve transfers and stair climbing   Baseline right knee flexion 0-75; 08/06/2017 0-110/115; 08/27/17 0-115/117   Status Revised   Target Date 10/22/17               Plan - 09/03/17 1912    Clinical Impression Statement Patient is progressing steadily towards goals with improvement noted in ROM and strength righ LE. She continues with right knee pain that is limiting ability to walk without difficulty and sleep without difficulty. She should continue to progress with additional physical therapy intervention  including manual soft tissue mobilization, strengthening exercises with guidance.   Rehab Potential Good   Clinical Impairments Affecting Rehab Potential (+)motivated, active lifestyle prior level of function(-)comorbidities MS, prior knee pain    PT Frequency 2x / week   PT Duration 8 weeks   PT Treatment/Interventions Iontophoresis /ml Dexamethasone;Therapeutic activities;Therapeutic exercise;Moist Heat;Manual techniques;Patient/family education;Cryotherapy;Neuromuscular re-education;Ultrasound;Electrical Stimulation   PT Next Visit Plan electrical stimulation for pain control, meuromuscular re-education   PT Home Exercise Plan hip, knee quad sets, guleal sets, hip adduction with ball and glute sets; use of ice for pain control, elevation for control of swelling      Patient will benefit from skilled therapeutic intervention in order to improve the following deficits and impairments:  Pain, Decreased strength, Difficulty walking, Decreased range of motion, Decreased endurance, Impaired perceived functional ability, Decreased activity tolerance  Visit Diagnosis: Acute pain of right knee  Stiffness of right knee, not elsewhere classified  Difficulty in walking, not elsewhere classified  Muscle weakness (generalized)  Problem List Patient Active Problem List   Diagnosis Date Noted  . Left shoulder pain 04/24/2017  . Right knee pain 04/24/2017  . Hemorrhoids 08/02/2016  . Low back pain 05/17/2016  . Right hip pain 05/17/2016  . Chest tightness 03/01/2016  . Rash 01/31/2015  . Health care maintenance 01/31/2015  . Left knee pain 10/12/2013  . GERD (gastroesophageal reflux disease) 12/29/2012  . Abnormal Pap smear of cervix 12/29/2012  . Essential hypertension, benign 12/25/2012  . Anemia 12/25/2012  . Degeneration of lumbar or lumbosacral intervertebral disc 12/25/2012  . Hypercholesterolemia 12/25/2012  . Vitamin D deficiency 12/25/2012  . Multiple sclerosis (HCC)  12/25/2012    [name] PT 09/04/2017, 5:30 PM  [place] PHYSICAL AND SPORTS MEDICINE [address] [place], [address] Phone: [phone]   Fax:  [phone]  Name: Jackie White MRN: [id number] Date of Birth: 06-30-1956

## 2017-09-05 ENCOUNTER — Encounter: Payer: Self-pay | Admitting: Physical Therapy

## 2017-09-05 ENCOUNTER — Ambulatory Visit: Payer: BC Managed Care – PPO | Admitting: Physical Therapy

## 2017-09-05 DIAGNOSIS — M25661 Stiffness of right knee, not elsewhere classified: Secondary | ICD-10-CM

## 2017-09-05 DIAGNOSIS — M25561 Pain in right knee: Secondary | ICD-10-CM

## 2017-09-05 DIAGNOSIS — M6281 Muscle weakness (generalized): Secondary | ICD-10-CM

## 2017-09-05 DIAGNOSIS — R262 Difficulty in walking, not elsewhere classified: Secondary | ICD-10-CM

## 2017-09-06 NOTE — Therapy (Signed)
[facility] [address] [place], [address] Phone: [phone]   Fax:  [phone]  Physical Therapy Treatment  Patient Details  Name: Jackie White MRN: [id number] Date of Birth: Sep 10, 1956 Referring Provider: Patsy Lager MD  Encounter Date: 09/05/2017      PT End of Session - 09/05/17 1743    Visit Number 19   Number of Visits 32   Date for PT Re-Evaluation 10/22/17   PT Start Time 1640   PT Stop Time 1740   PT Time Calculation (min) 60 min   Activity Tolerance Patient tolerated treatment well;Patient limited by pain   Behavior During Therapy [facility] for tasks assessed/performed      Past Medical History:  Diagnosis Date  . Abnormal Pap smear   . Cervical disc disease    Cervical and lumbar disc disease  . GERD (gastroesophageal reflux disease)   . History of iron deficiency   . Hypertension   . Multiple sclerosis (HCC)   . Personal history of colonic polyps   . Reactive airway disease   . Vitamin D deficiency     Past Surgical History:  Procedure Laterality Date  . BREAST REDUCTION SURGERY    . REDUCTION MAMMAPLASTY Bilateral 2002  . TOTAL ABDOMINAL HYSTERECTOMY  2003   Right Oophrectomy    There were no vitals filed for this visit.      Subjective Assessment - 09/05/17 1641    Subjective Patient reports increased knee soreness. she is to see MD this week and plans to discuss healing and continued pain in knee.   Pertinent History May 2018 patient reports her right knee began hurting her and caused her to limp with walking, no apparent reason. S/P right knee arthroscopy 06/21/2017 for partial lateral meniscectomy and chondroplasty   Limitations Standing;House hold activities;Walking;Sitting;Other (comment)   Diagnostic tests MRI right knee   Patient Stated Goals decrease pain and be able to walk with less difficulty and sit without pain   Currently in Pain? Yes   Pain Score 3    Pain Location Knee   Pain Orientation Right   Pain Descriptors / Indicators Aching;Sore   Pain Type Surgical pain  06/21/2017   Pain Onset More than a month ago   Pain Frequency Intermittent      Objective: Gait: ambulating with mild antalgic gait pattern, decreased weight shift to right, using SPC for balance intermittently Palpation: right knee posterior aspect hamstring and calf muscles with spasms  Treatment: Manual therapy: 8 min. Goal: pain, improve soft tissue elasticity  Right knee posterior aspect superficial techniques with patient prone lying; patella mobilization with mobilizer   Modalities: performed at end of session:  Electrical stimulation: stim. 10/10 cycle, intensity to tolerance, contractionapplied (2) electrodes to quadriceps/VMO right LE with patient reclined with LE supported on pillow; pt. Performing quad sets with each cycle; goal muscle re education High volt estim.clincial program for muscle spasms (2) electrodes applied to posteiror  aspect of right knee above and below kneeintensity to tolerance with patient in reclined position with LE supported on pillow goal: pain, spasms  Ice pack applied to right knee with estim. No adverse reactions noted  Therapeutic exercise:patient performed exercises with verbal, tactile cues and demonstration of therapist: goal: independent with home program, improve LEFS, strength Leg press 45#,55# x 15, 65# x 15 reps short arc to avoid increased pain/strain on knee Standing  step ups onto balance pad with balance stones on top  leading with right LE x 10 then left LE x 10 Lateral step up/overs x 10 reps on balance pad  Nustep warm up independent Level#4 (unbilled time) x 10 min.  Patient response to treatment; Patient demonstrated improved technique with improved strength with exercises. increased difficulty with balance stones. Improved soft  Tissue elasticity following STM to posterior aspect of right  knee, LE. Patient reported decreased pain at end of session with improved ability to walk with less difficulty         PT Education - 09/05/17 1700    Education provided Yes   Education Details exercise instruction   Person(s) Educated Patient   Methods Explanation;Demonstration;Verbal cues   Comprehension Verbalized understanding;Returned demonstration;Verbal cues required             PT Long Term Goals - 08/27/17 1459      PT LONG TERM GOAL #1   Title Patient will demonstrate improved function right LE for daily activities including sitting, standing, walking with LEFS score of 60/80    Baseline LEFS 20/80; LEFS 35/80 08/06/2017; LEFS 08/23/17 40/80   Status Revised   Target Date 10/22/17     PT LONG TERM GOAL #2   Title Patient will be independent with home program for flexibility, strengthening and pain control  to allow transition to self management once discharged from physical therapy   Baseline limited knowledge, requires cuing and guidance for appropriate exercise and performance   Status Revised   Target Date 10/22/17     PT LONG TERM GOAL #3   Title Patient will be independent with ambulation without AD with normal gait pattern and gait speed for improved function with community ambulation   Baseline crutches, PWB right LE   Status Revised   Target Date 10/22/17     PT LONG TERM GOAL #4   Title Patient will improve AROM right knee flexion to WNL with minimal to no pain to improve transfers and stair climbing   Baseline right knee flexion 0-75; 08/06/2017 0-110/115; 08/27/17 0-115/117   Status Revised   Target Date 10/22/17               Plan - 09/05/17 1744    Clinical Impression Statement Patient is progressing towards goasl following arthrosopy right knee 06/21/17. she continues with intermittent, mild to moderate pain and warmth right knee. she will benefit from continued physical therapy intervention to address limitaions and achieve goals.   Rehab  Potential Good   Clinical Impairments Affecting Rehab Potential (+)motivated, active lifestyle prior level of function(-)comorbidities MS, prior knee pain    PT Frequency 2x / week   PT Duration 8 weeks   PT Treatment/Interventions Iontophoresis 4mg /ml Dexamethasone;Therapeutic activities;Therapeutic exercise;Moist Heat;Manual techniques;Patient/family education;Cryotherapy;Neuromuscular re-education;Ultrasound;Electrical Stimulation   PT Next Visit Plan electrical stimulation for pain control, meuromuscular re-education   PT Home Exercise Plan hip, knee quad sets, guleal sets, hip adduction with ball and glute sets; use of ice for pain control, elevation for control of swelling      Patient will benefit from skilled therapeutic intervention in order to improve the following deficits and impairments:  Pain, Decreased strength, Difficulty walking, Decreased range of motion, Decreased endurance, Impaired perceived functional ability, Decreased activity tolerance  Visit Diagnosis: Acute pain of right knee  Stiffness of right knee, not elsewhere classified  Difficulty in walking, not elsewhere classified  Muscle weakness (generalized)     Problem List Patient Active Problem List   Diagnosis Date Noted  . Left shoulder pain 04/24/2017  .  Right knee pain 04/24/2017  . Hemorrhoids 08/02/2016  . Low back pain 05/17/2016  . Right hip pain 05/17/2016  . Chest tightness 03/01/2016  . Rash 01/31/2015  . Health care maintenance 01/31/2015  . Left knee pain 10/12/2013  . GERD (gastroesophageal reflux disease) 12/29/2012  . Abnormal Pap smear of cervix 12/29/2012  . Essential hypertension, benign 12/25/2012  . Anemia 12/25/2012  . Degeneration of lumbar or lumbosacral intervertebral disc 12/25/2012  . Hypercholesterolemia 12/25/2012  . Vitamin D deficiency 12/25/2012  . Multiple sclerosis (HCC) 12/25/2012    [name] PT 09/06/2017, 10:28 PM  [facility]  [facility] PHYSICAL AND SPORTS MEDICINE [address] [place], [address] Phone: [phone]   Fax:  [phone]  Name: Jackie White MRN: [id number] Date of Birth: 1956/10/14

## 2017-09-10 ENCOUNTER — Ambulatory Visit: Payer: BC Managed Care – PPO | Admitting: Physical Therapy

## 2017-09-10 ENCOUNTER — Encounter: Payer: Self-pay | Admitting: Physical Therapy

## 2017-09-10 DIAGNOSIS — M25661 Stiffness of right knee, not elsewhere classified: Secondary | ICD-10-CM

## 2017-09-10 DIAGNOSIS — M25561 Pain in right knee: Secondary | ICD-10-CM

## 2017-09-10 DIAGNOSIS — R262 Difficulty in walking, not elsewhere classified: Secondary | ICD-10-CM

## 2017-09-10 DIAGNOSIS — M6281 Muscle weakness (generalized): Secondary | ICD-10-CM

## 2017-09-10 NOTE — Therapy (Signed)
[place] [facility] [address] [place], [address] Phone: [phone]   Fax:  [phone]  Physical Therapy Treatment  Patient Details  Name: Jackie White MRN: [id number] Date of Birth: [birth year] Referring Provider: Patsy Lager MD  Encounter Date: 09/10/2017      PT End of Session - 09/10/17 1849    Visit Number 20   Number of Visits 32   Date for PT Re-Evaluation 10/22/17   PT Start Time 1813   PT Stop Time 1902   PT Time Calculation (min) 49 min   Activity Tolerance Patient tolerated treatment well;Patient limited by pain   Behavior During Therapy [facility] for tasks assessed/performed      Past Medical History:  Diagnosis Date  . Abnormal Pap smear   . Cervical disc disease    Cervical and lumbar disc disease  . GERD (gastroesophageal reflux disease)   . History of iron deficiency   . Hypertension   . Multiple sclerosis (HCC)   . Personal history of colonic polyps   . Reactive airway disease   . Vitamin D deficiency     Past Surgical History:  Procedure Laterality Date  . BREAST REDUCTION SURGERY    . REDUCTION MAMMAPLASTY Bilateral 2002  . TOTAL ABDOMINAL HYSTERECTOMY  2003   Right Oophrectomy    There were no vitals filed for this visit.      Subjective Assessment - 09/10/17 1814    Subjective Patient reports she had a cortisone shot and is on additional medication for inflammation, Voltaren.    Pertinent History May 2018 patient reports her right knee began hurting her and caused her to limp with walking, no apparent reason. S/P right knee arthroscopy 06/21/2017 for partial lateral meniscectomy and chondroplasty   Limitations Standing;House hold activities;Walking;Sitting;Other (comment)   Diagnostic tests MRI right knee   Patient Stated Goals decrease pain and be able to walk with less difficulty and sit without pain   Currently in Pain? Yes   Pain Score 4    Pain Location Knee   Pain Orientation Right   Pain Descriptors / Indicators Aching   Pain Type Surgical pain  06/21/2017   Pain Onset More than a month ago   Pain Frequency Intermittent        Objective: Gait: ambulating with mild antalgic gait pattern, using SPC for balance intermittently Palpation: right knee posterior aspect hamstring and calf muscles with spasms  Treatment:  Modalities: performed at end of session:  Electrical stimulation: stim. 10/10 cycle, intensity to tolerance, contractionapplied (2) electrodes to quadriceps/VMO right LE with patient reclined with LE supported on pillow; pt. Performing quad sets with each cycle; goal muscle re education High volt estim.clincial program for muscle spasms (2) electrodes applied to posteiror aspect of right knee above and below kneeintensity to tolerance with patient in reclined position with LE supported on pillow goal: pain, spasms  Ice pack applied to right knee with estim. No adverse reactions noted  Therapeutic exercise:patient performed exercises with verbal, tactile cues and demonstration of therapist: goal: independent with home program, improve LEFS, strength Leg press 55# x 15, 65# x 15 reps short arc to avoid increased pain/strain on knee Standing step ups onto balance pad on top leading with right LE x 10 then left LE x 10 Lateral step up/overs x 10 reps on balance pad  Nustep warm up independent Level#4 (unbilled time) x 10 min.  Patient response to treatment; Patient demonstrated improved ability  to perform exercises with mild sorness and good technique with minimal guidance and cuing. no increased pain reported throughout session.        PT Education - 09/10/17 1849    Education provided Yes   Education Details exercise instruction   Person(s) Educated Patient   Methods Explanation;Verbal cues   Comprehension Verbalized understanding;Verbal cues required             PT Long Term Goals - 08/27/17  1459      PT LONG TERM GOAL #1   Title Patient will demonstrate improved function right LE for daily activities including sitting, standing, walking with LEFS score of 60/80    Baseline LEFS 20/80; LEFS 35/80 08/06/2017; LEFS 08/23/17 40/80   Status Revised   Target Date 10/22/17     PT LONG TERM GOAL #2   Title Patient will be independent with home program for flexibility, strengthening and pain control  to allow transition to self management once discharged from physical therapy   Baseline limited knowledge, requires cuing and guidance for appropriate exercise and performance   Status Revised   Target Date 10/22/17     PT LONG TERM GOAL #3   Title Patient will be independent with ambulation without AD with normal gait pattern and gait speed for improved function with community ambulation   Baseline crutches, PWB right LE   Status Revised   Target Date 10/22/17     PT LONG TERM GOAL #4   Title Patient will improve AROM right knee flexion to WNL with minimal to no pain to improve transfers and stair climbing   Baseline right knee flexion 0-75; 08/06/2017 0-110/115; 08/27/17 0-115/117   Status Revised   Target Date 10/22/17               Plan - 09/10/17 1850    Clinical Impression Statement Patient demonstrates continued improvement following injection and with estim and guided exercises. She continues with limitations of pain and decreased strength and will benefit from additional physical therapy intervention.   Rehab Potential Good   Clinical Impairments Affecting Rehab Potential (+)motivated, active lifestyle prior level of function(-)comorbidities MS, prior knee pain    PT Frequency 2x / week   PT Duration 8 weeks   PT Treatment/Interventions Iontophoresis 4mg /ml Dexamethasone;Therapeutic activities;Therapeutic exercise;Moist Heat;Manual techniques;Patient/family education;Cryotherapy;Neuromuscular re-education;Ultrasound;Electrical Stimulation   PT Next Visit Plan  electrical stimulation for pain control, meuromuscular re-education   PT Home Exercise Plan hip, knee quad sets, guleal sets, hip adduction with ball and glute sets; use of ice for pain control, elevation for control of swelling      Patient will benefit from skilled therapeutic intervention in order to improve the following deficits and impairments:  Pain, Decreased strength, Difficulty walking, Decreased range of motion, Decreased endurance, Impaired perceived functional ability, Decreased activity tolerance  Visit Diagnosis: Acute pain of right knee  Stiffness of right knee, not elsewhere classified  Difficulty in walking, not elsewhere classified  Muscle weakness (generalized)     Problem List Patient Active Problem List   Diagnosis Date Noted  . Left shoulder pain 04/24/2017  . Right knee pain 04/24/2017  . Hemorrhoids 08/02/2016  . Low back pain 05/17/2016  . Right hip pain 05/17/2016  . Chest tightness 03/01/2016  . Rash 01/31/2015  . Health care maintenance 01/31/2015  . Left knee pain 10/12/2013  . GERD (gastroesophageal reflux disease) 12/29/2012  . Abnormal Pap smear of cervix 12/29/2012  . Essential hypertension, benign 12/25/2012  . Anemia 12/25/2012  .  Degeneration of lumbar or lumbosacral intervertebral disc 12/25/2012  . Hypercholesterolemia 12/25/2012  . Vitamin D deficiency 12/25/2012  . Multiple sclerosis (HCC) 12/25/2012    [name] PT 09/11/2017, 11:26 PM  [place] [facility] PHYSICAL AND SPORTS MEDICINE [address], [place], [address] Phone: [phone]   Fax:  [phone]  Name: ELISHIA KACZOROWSKI MRN: [id number] Date of Birth: 1956/05/16

## 2017-09-12 ENCOUNTER — Encounter: Payer: Self-pay | Admitting: Physical Therapy

## 2017-09-12 ENCOUNTER — Ambulatory Visit: Payer: BC Managed Care – PPO | Admitting: Physical Therapy

## 2017-09-12 DIAGNOSIS — R262 Difficulty in walking, not elsewhere classified: Secondary | ICD-10-CM

## 2017-09-12 DIAGNOSIS — M25561 Pain in right knee: Secondary | ICD-10-CM | POA: Diagnosis not present

## 2017-09-12 DIAGNOSIS — M25661 Stiffness of right knee, not elsewhere classified: Secondary | ICD-10-CM

## 2017-09-12 DIAGNOSIS — M6281 Muscle weakness (generalized): Secondary | ICD-10-CM

## 2017-09-12 NOTE — Therapy (Signed)
[place] [facility]TS [facility]ICINE [address], [place], [address] Phone: [phone]   Fax:  [phone]  Physical Therapy Treatment  Patient Details  Name: Jackie White MRN: [id number] Date of Birth: 1956/02/09 Referring Provider: Patsy Lager MD  Encounter Date: 09/12/2017      PT End of Session - 09/12/17 1823    Visit Number 20   Number of Visits 32   Date for PT Re-Evaluation 10/22/17   PT Start Time 1810   PT Stop Time 1910   PT Time Calculation (min) 60 min   Activity Tolerance Patient tolerated treatment well;Patient limited by pain   Behavior During Therapy [facility] for tasks assessed/performed      Past Medical History:  Diagnosis Date  . Abnormal Pap smear   . Cervical disc disease    Cervical and lumbar disc disease  . GERD (gastroesophageal reflux disease)   . History of iron deficiency   . Hypertension   . Multiple sclerosis (HCC)   . Personal history of colonic polyps   . Reactive airway disease   . Vitamin D deficiency     Past Surgical History:  Procedure Laterality Date  . BREAST REDUCTION SURGERY    . REDUCTION MAMMAPLASTY Bilateral 2002  . TOTAL ABDOMINAL HYSTERECTOMY  2003   Right Oophrectomy    There were no vitals filed for this visit.      Subjective Assessment - 09/12/17 1817    Subjective Patient reports much improved and able to walk without AD today.    Pertinent History May 2018 patient reports her right knee began hurting her and caused her to limp with walking, no apparent reason. S/P right knee arthroscopy 06/21/2017 for partial lateral meniscectomy and chondroplasty   Limitations Standing;House hold activities;Walking;Sitting;Other (comment)   Diagnostic tests MRI right knee   Patient Stated Goals decrease pain and be able to walk with less difficulty and sit without pain   Currently in Pain? Yes   Pain Score 2    Pain Location Knee   Pain Orientation Right;Medial   Pain  Descriptors / Indicators Sore;Tightness   Pain Type Surgical pain  06/21/2017   Pain Onset More than a month ago   Pain Frequency Intermittent        Objective: Gait: ambulating with mild antalgic gait pattern, improved cadence, no AD Palpation: right knee mild increased warmth over patella tendon  Treatment:  Modalities: performed at end of session:  Electrical stimulation: 20 minRussian stim. 10/10 cycle, intensity to tolerance, contractionapplied (2) electrodes to quadriceps/VMO right LE with patient reclined with LE supported on pillow; pt. Performing quad sets with each cycle; goal muscle re education High volt estim.clincial program for muscle spasms (2) electrodes applied to medial and lateralaspect of right knee above and below kneeintensity to tolerance with patient in reclined position with LE supported on pillow goal: pain, spasms  Ice pack applied to right knee with estim. No adverse reactions noted  Therapeutic exercise:patient performed exercises with verbal, tactile cues and demonstration of therapist: goal: independent with home program, improve LEFS, strength Leg press 55# x 15, 65# x 15 reps short arc to avoid increased pain/strain on knee; single leg press 35# x 10 reps each LE Standing single leg with opposite staggered behind: TKE with blue resistive band x 15 Staggered stance with weight bearing LE in back position: pull blue band to each side x 30 seconds  Resistive band walking with guidance of therapist 10x forward, 10x  backwards, 5x side stepping to right and left  Nustep warm up independent Level#4 (unbilled time) x 10 min.  Patient response to treatment; Patient demonstrated improved ability to perform exercises with mild sorness and good technique with minimal guidance and cuing. no increased pain reported throughout session.          PT Education - 09/12/17 1821    Education provided Yes   Education Details exercise instruction    Person(s) Educated Patient   Methods Explanation;Verbal cues   Comprehension Verbal cues required;Verbalized understanding             PT Long Term Goals - 08/27/17 1459      PT LONG TERM GOAL #1   Title Patient will demonstrate improved function right LE for daily activities including sitting, standing, walking with LEFS score of 60/80    Baseline LEFS 20/80; LEFS 35/80 08/06/2017; LEFS 08/23/17 40/80   Status Revised   Target Date 10/22/17     PT LONG TERM GOAL #2   Title Patient will be independent with home program for flexibility, strengthening and pain control  to allow transition to self management once discharged from physical therapy   Baseline limited knowledge, requires cuing and guidance for appropriate exercise and performance   Status Revised   Target Date 10/22/17     PT LONG TERM GOAL #3   Title Patient will be independent with ambulation without AD with normal gait pattern and gait speed for improved function with community ambulation   Baseline crutches, PWB right LE   Status Revised   Target Date 10/22/17     PT LONG TERM GOAL #4   Title Patient will improve AROM right knee flexion to WNL with minimal to no pain to improve transfers and stair climbing   Baseline right knee flexion 0-75; 08/06/2017 0-110/115; 08/27/17 0-115/117   Status Revised   Target Date 10/22/17               Plan - 09/12/17 1824    Clinical Impression Statement Patient is progressing with less pain in right knee that allows her to exercise more effectively with improved control.    Rehab Potential Good   Clinical Impairments Affecting Rehab Potential (+)motivated, active lifestyle prior level of function(-)comorbidities MS, prior knee pain    PT Frequency 2x / week   PT Duration 8 weeks   PT Treatment/Interventions Iontophoresis 4mg /ml Dexamethasone;Therapeutic activities;Therapeutic exercise;Moist Heat;Manual techniques;Patient/family education;Cryotherapy;Neuromuscular  re-education;Ultrasound;Electrical Stimulation   PT Next Visit Plan electrical stimulation for pain control, meuromuscular re-education   PT Home Exercise Plan hip, knee quad sets, guleal sets, hip adduction with ball and glute sets; use of ice for pain control, elevation for control of swelling      Patient will benefit from skilled therapeutic intervention in order to improve the following deficits and impairments:  Pain, Decreased strength, Difficulty walking, Decreased range of motion, Decreased endurance, Impaired perceived functional ability, Decreased activity tolerance  Visit Diagnosis: Acute pain of right knee  Stiffness of right knee, not elsewhere classified  Difficulty in walking, not elsewhere classified  Muscle weakness (generalized)     Problem List Patient Active Problem List   Diagnosis Date Noted  . Left shoulder pain 04/24/2017  . Right knee pain 04/24/2017  . Hemorrhoids 08/02/2016  . Low back pain 05/17/2016  . Right hip pain 05/17/2016  . Chest tightness 03/01/2016  . Rash 01/31/2015  . Health care maintenance 01/31/2015  . Left knee pain 10/12/2013  . GERD (gastroesophageal reflux  disease) 12/29/2012  . Abnormal Pap smear of cervix 12/29/2012  . Essential hypertension, benign 12/25/2012  . Anemia 12/25/2012  . Degeneration of lumbar or lumbosacral intervertebral disc 12/25/2012  . Hypercholesterolemia 12/25/2012  . Vitamin D deficiency 12/25/2012  . Multiple sclerosis (HCC) 12/25/2012    [name] PT 09/13/2017, 12:12 PM  [place] [facility] PHYSICAL AND SPORTS MEDICINE [address], [place], [address] Phone: [phone]   Fax:  [phone]  Name: Jackie White MRN: [id number] Date of Birth: 06/22/1956

## 2017-09-17 ENCOUNTER — Encounter: Payer: Self-pay | Admitting: Physical Therapy

## 2017-09-17 ENCOUNTER — Ambulatory Visit: Payer: BC Managed Care – PPO | Admitting: Physical Therapy

## 2017-09-17 DIAGNOSIS — M25561 Pain in right knee: Secondary | ICD-10-CM

## 2017-09-17 DIAGNOSIS — R262 Difficulty in walking, not elsewhere classified: Secondary | ICD-10-CM

## 2017-09-17 DIAGNOSIS — M6281 Muscle weakness (generalized): Secondary | ICD-10-CM

## 2017-09-17 DIAGNOSIS — M25661 Stiffness of right knee, not elsewhere classified: Secondary | ICD-10-CM

## 2017-09-17 NOTE — Therapy (Signed)
[place] [facility] [address] [place], [address] Phone: [phone]   Fax:  [phone]  Physical Therapy Treatment  Patient Details  Name: Jackie White MRN: [id number] Date of Birth: Jan 28, 1956 Referring Provider: Patsy Lager MD  Encounter Date: 09/17/2017      PT End of Session - 09/17/17 1319    Visit Number 22   Number of Visits 32   Date for PT Re-Evaluation 10/22/17   PT Start Time 1256   PT Stop Time 1355   PT Time Calculation (min) 59 min   Activity Tolerance Patient tolerated treatment well;Patient limited by pain   Behavior During Therapy [facility] for tasks assessed/performed      Past Medical History:  Diagnosis Date  . Abnormal Pap smear   . Cervical disc disease    Cervical and lumbar disc disease  . GERD (gastroesophageal reflux disease)   . History of iron deficiency   . Hypertension   . Multiple sclerosis (HCC)   . Personal history of colonic polyps   . Reactive airway disease   . Vitamin D deficiency     Past Surgical History:  Procedure Laterality Date  . BREAST REDUCTION SURGERY    . REDUCTION MAMMAPLASTY Bilateral 2002  . TOTAL ABDOMINAL HYSTERECTOMY  2003   Right Oophrectomy    There were no vitals filed for this visit.      Subjective Assessment - 09/17/17 1312    Subjective Patient reports mild to no pain today.    Pertinent History May 2018 patient reports her right knee began hurting her and caused her to limp with walking, no apparent reason. S/P right knee arthroscopy 06/21/2017 for partial lateral meniscectomy and chondroplasty   Limitations Standing;House hold activities;Walking;Sitting;Other (comment)   Diagnostic tests MRI right knee   Patient Stated Goals decrease pain and be able to walk with less difficulty and sit without pain   Currently in Pain? No/denies       Objective: Gait: ambulating with improved cadence, no AD Palpation: right knee mild  increased warmth over patella tendon  Treatment:  Modalities: performed at end of session:  Electrical stimulation: 15 minRussian stim. 10/10 cycle, intensity to tolerance, contractionapplied (2) electrodes to quadriceps/VMO right LE with patient reclined with LE supported on pillow; pt. Performing quad sets with each cycle; goal muscle re education High volt estim.clincial program for muscle spasms (2) electrodes applied to posterioraspect of right knee above and below kneeintensity to tolerance with patient in reclined position with LE supported on pillow goal: pain, spasms  Ice pack applied to right knee with estim. No adverse reactions noted  Therapeutic exercise:patient performed exercises with verbal, tactile cues and demonstration of therapist: goal: independent with home program, improve LEFS, strength Leg press 65# x 15 reps short arc to avoid increased pain/strain on knee, 25# x 25 reps; single leg press 35# x 10 reps left, 15 right LE Staggered stance with weight bearing LE in front position for partial single leg standing exercise: bilateral pull at OMEGA 10# x 15 reps each LE Balance system limits of stability, platform unlocked #7, 96% accuracy at each skill level  Nustep warm up independent Level#5 (unbilled time) x 10 min.  Patient response to treatment; Patient demonstrated  Improved technique, balance, accuracy with repetition and minimal cuing. No increased pain reported throughout session.         PT Education - 09/17/17 1319    Education provided Yes   Education Details  exercise instruction   Person(s) Educated Patient   Methods Explanation;Verbal cues;Demonstration   Comprehension Verbalized understanding;Returned demonstration;Verbal cues required             PT Long Term Goals - 08/27/17 1459      PT LONG TERM GOAL #1   Title Patient will demonstrate improved function right LE for daily activities including sitting, standing, walking with  LEFS score of 60/80    Baseline LEFS 20/80; LEFS 35/80 08/06/2017; LEFS 08/23/17 40/80   Status Revised   Target Date 10/22/17     PT LONG TERM GOAL #2   Title Patient will be independent with home program for flexibility, strengthening and pain control  to allow transition to self management once discharged from physical therapy   Baseline limited knowledge, requires cuing and guidance for appropriate exercise and performance   Status Revised   Target Date 10/22/17     PT LONG TERM GOAL #3   Title Patient will be independent with ambulation without AD with normal gait pattern and gait speed for improved function with community ambulation   Baseline crutches, PWB right LE   Status Revised   Target Date 10/22/17     PT LONG TERM GOAL #4   Title Patient will improve AROM right knee flexion to WNL with minimal to no pain to improve transfers and stair climbing   Baseline right knee flexion 0-75; 08/06/2017 0-110/115; 08/27/17 0-115/117   Status Revised   Target Date 10/22/17               Plan - 09/17/17 1321    Clinical Impression Statement Patient demonstrates improved motor control and strength with exercises as indicate by increased intensity and improved accuracy to 96% with balance system. She continues with decreased strength left LE and will benefit from guided exercises to improve function and achieve goals.    Rehab Potential Good   Clinical Impairments Affecting Rehab Potential (+)motivated, active lifestyle prior level of function(-)comorbidities MS, prior knee pain    PT Frequency 2x / week   PT Duration 8 weeks   PT Treatment/Interventions Iontophoresis 4mg /ml Dexamethasone;Therapeutic activities;Therapeutic exercise;Moist Heat;Manual techniques;Patient/family education;Cryotherapy;Neuromuscular re-education;Ultrasound;Electrical Stimulation   PT Next Visit Plan electrical stimulation for pain control, meuromuscular re-education   PT Home Exercise Plan hip, knee quad  sets, guleal sets, hip adduction with ball and glute sets; use of ice for pain control, elevation for control of swelling      Patient will benefit from skilled therapeutic intervention in order to improve the following deficits and impairments:  Pain, Decreased strength, Difficulty walking, Decreased range of motion, Decreased endurance, Impaired perceived functional ability, Decreased activity tolerance  Visit Diagnosis: Acute pain of right knee  Stiffness of right knee, not elsewhere classified  Difficulty in walking, not elsewhere classified  Muscle weakness (generalized)     Problem List Patient Active Problem List   Diagnosis Date Noted  . Left shoulder pain 04/24/2017  . Right knee pain 04/24/2017  . Hemorrhoids 08/02/2016  . Low back pain 05/17/2016  . Right hip pain 05/17/2016  . Chest tightness 03/01/2016  . Rash 01/31/2015  . Health care maintenance 01/31/2015  . Left knee pain 10/12/2013  . GERD (gastroesophageal reflux disease) 12/29/2012  . Abnormal Pap smear of cervix 12/29/2012  . Essential hypertension, benign 12/25/2012  . Anemia 12/25/2012  . Degeneration of lumbar or lumbosacral intervertebral disc 12/25/2012  . Hypercholesterolemia 12/25/2012  . Vitamin D deficiency 12/25/2012  . Multiple sclerosis (HCC) 12/25/2012  Beacher MayBrooks, Lenny Fiumara PT 09/17/2017, 2:36 PM  [place] [facility] [address] , [place], [address] Phone: [phone]   Fax:  [phone]  Name: Jackie White MRN: [id number] Date of Birth: [birth year]

## 2017-09-19 ENCOUNTER — Encounter: Payer: Self-pay | Admitting: Physical Therapy

## 2017-09-19 ENCOUNTER — Ambulatory Visit: Payer: BC Managed Care – PPO | Admitting: Physical Therapy

## 2017-09-19 DIAGNOSIS — M25661 Stiffness of right knee, not elsewhere classified: Secondary | ICD-10-CM

## 2017-09-19 DIAGNOSIS — R262 Difficulty in walking, not elsewhere classified: Secondary | ICD-10-CM

## 2017-09-19 DIAGNOSIS — M25561 Pain in right knee: Secondary | ICD-10-CM

## 2017-09-19 DIAGNOSIS — M6281 Muscle weakness (generalized): Secondary | ICD-10-CM

## 2017-09-19 NOTE — Therapy (Signed)
[place] [facility] [address] [place], [address] Phone: [phone]   Fax:  [phone]  Physical Therapy Treatment  Patient Details  Name: Jackie White MRN: [id number] Date of Birth: 10-12-1956 Referring Provider: Patsy Lager MD  Encounter Date: 09/19/2017      PT End of Session - 09/19/17 1844    Visit Number 23   Number of Visits 32   Date for PT Re-Evaluation 10/22/17   PT Start Time 1832   PT Stop Time 1910   PT Time Calculation (min) 38 min   Activity Tolerance Patient tolerated treatment well;Patient limited by pain   Behavior During Therapy [facility] for tasks assessed/performed      Past Medical History:  Diagnosis Date  . Abnormal Pap smear   . Cervical disc disease    Cervical and lumbar disc disease  . GERD (gastroesophageal reflux disease)   . History of iron deficiency   . Hypertension   . Multiple sclerosis (HCC)   . Personal history of colonic polyps   . Reactive airway disease   . Vitamin D deficiency     Past Surgical History:  Procedure Laterality Date  . BREAST REDUCTION SURGERY    . REDUCTION MAMMAPLASTY Bilateral 2002  . TOTAL ABDOMINAL HYSTERECTOMY  2003   Right Oophrectomy    There were no vitals filed for this visit.      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 late today. she requests to shorten session due to late arrival.    Pertinent History May 2018 patient reports her right knee began hurting her and caused her to limp with walking, no apparent reason. S/P right knee arthroscopy 06/21/2017 for partial lateral meniscectomy and chondroplasty   Limitations Standing;House hold activities;Walking;Sitting;Other (comment)   Diagnostic tests MRI right knee   Patient Stated Goals decrease pain and be able to walk with less difficulty and sit without pain   Currently in  Pain? No/denies         Objective: Gait: ambulating with improved cadence, no AD  Treatment: Therapeutic exercise:patient performed exercises with verbal, tactile cues and demonstration of therapist: goal: independent with home program, improve LEFS, strength Leg press 65# x 15 reps short arc to avoid increased pain/strain on knee, 25# x 25 reps; single leg press 35# x 10 reps left, 15 right LE Staggered stance on balance pad stance with weight bearing LE in front position for partial single leg standing exercise: bilateral pull at OMEGA 10# x 15 reps each LE Balance pad staggered stance with one foot on pad and pulling cable with one hand on same side as LE and opposite side LE x 10 reps each; 5# Balance system limits of stability, platform unlocked #7, 96% accuracy at each skill level, 2 reps each level  Nustep warm up independent Level#5 (unbilled time) x 10 min.  Patient response to treatment; Patient demonstrated improved technique, accuracy with balance with repetition and minimal cuing. Required close supervision with standing stabilization on balance pad. Patient reported soreness in left knee at end of session.           PT Education - 09/19/17 1844    Education provided Yes   Education Details exercise instruction with re assessment of HEP   Person(s) Educated Patient   Methods Demonstration;Explanation;Verbal cues   Comprehension Verbalized understanding;Returned demonstration;Verbal cues required  PT Long Term Goals - 08/27/17 1459      PT LONG TERM GOAL #1   Title Patient will demonstrate improved function right LE for daily activities including sitting, standing, walking with LEFS score of 60/80    Baseline LEFS 20/80; LEFS 35/80 08/06/2017; LEFS 08/23/17 40/80   Status Revised   Target Date 10/22/17     PT LONG TERM GOAL #2   Title Patient will be independent with home program for flexibility, strengthening and pain control  to allow  transition to self management once discharged from physical therapy   Baseline limited knowledge, requires cuing and guidance for appropriate exercise and performance   Status Revised   Target Date 10/22/17     PT LONG TERM GOAL #3   Title Patient will be independent with ambulation without AD with normal gait pattern and gait speed for improved function with community ambulation   Baseline crutches, PWB right LE   Status Revised   Target Date 10/22/17     PT LONG TERM GOAL #4   Title Patient will improve AROM right knee flexion to WNL with minimal to no pain to improve transfers and stair climbing   Baseline right knee flexion 0-75; 08/06/2017 0-110/115; 08/27/17 0-115/117   Status Revised   Target Date 10/22/17               Plan - 09/19/17 1846    Clinical Impression Statement Patient demonstrates continued improvement with strength and motor control with exercises with good progress towards goals. Limited time therefore no electrical stimulation performed today at patient request.    Rehab Potential Good   Clinical Impairments Affecting Rehab Potential (+)motivated, active lifestyle prior level of function(-)comorbidities MS, prior knee pain    PT Frequency 2x / week   PT Duration 8 weeks   PT Treatment/Interventions Iontophoresis 4mg /ml Dexamethasone;Therapeutic activities;Therapeutic exercise;Moist Heat;Manual techniques;Patient/family education;Cryotherapy;Neuromuscular re-education;Ultrasound;Electrical Stimulation   PT Next Visit Plan electrical stimulation for pain control, meuromuscular re-education   PT Home Exercise Plan hip, knee quad sets, guleal sets, hip adduction with ball and glute sets; use of ice for pain control, elevation for control of swelling      Patient will benefit from skilled therapeutic intervention in order to improve the following deficits and impairments:  Pain, Decreased strength, Difficulty walking, Decreased range of motion, Decreased  endurance, Impaired perceived functional ability, Decreased activity tolerance  Visit Diagnosis: Acute pain of right knee  Stiffness of right knee, not elsewhere classified  Difficulty in walking, not elsewhere classified  Muscle weakness (generalized)     Problem List Patient Active Problem List   Diagnosis Date Noted  . Left shoulder pain 04/24/2017  . Right knee pain 04/24/2017  . Hemorrhoids 08/02/2016  . Low back pain 05/17/2016  . Right hip pain 05/17/2016  . Chest tightness 03/01/2016  . Rash 01/31/2015  . Health care maintenance 01/31/2015  . Left knee pain 10/12/2013  . GERD (gastroesophageal reflux disease) 12/29/2012  . Abnormal Pap smear of cervix 12/29/2012  . Essential hypertension, benign 12/25/2012  . Anemia 12/25/2012  . Degeneration of lumbar or lumbosacral intervertebral disc 12/25/2012  . Hypercholesterolemia 12/25/2012  . Vitamin D deficiency 12/25/2012  . Multiple sclerosis (HCC) 12/25/2012    Beacher MayBrooks, Carloyn Lahue PT 09/19/2017, 7:37 PM  [place] [facility] [address] [place], [place], [address] Phone: [phone]   Fax:  [phone]  Name: Vaughan BastaMarida J Gorby MRN: [id number] Date of Birth: 1956/02/24

## 2017-09-24 ENCOUNTER — Encounter: Payer: Self-pay | Admitting: Physical Therapy

## 2017-09-24 ENCOUNTER — Ambulatory Visit: Payer: BC Managed Care – PPO | Attending: Orthopedic Surgery | Admitting: Physical Therapy

## 2017-09-24 DIAGNOSIS — M25561 Pain in right knee: Secondary | ICD-10-CM | POA: Insufficient documentation

## 2017-09-24 DIAGNOSIS — M25661 Stiffness of right knee, not elsewhere classified: Secondary | ICD-10-CM | POA: Diagnosis present

## 2017-09-24 DIAGNOSIS — M6281 Muscle weakness (generalized): Secondary | ICD-10-CM | POA: Diagnosis present

## 2017-09-24 DIAGNOSIS — R262 Difficulty in walking, not elsewhere classified: Secondary | ICD-10-CM | POA: Diagnosis present

## 2017-09-24 NOTE — Therapy (Signed)
[place] [facility] [address], [place], [address] Phone: [phone]   Fax:  [phone]  Physical Therapy Treatment  Patient Details  Name: Jackie White MRN: [id number] Date of Birth: 1956/11/11 Referring Provider: Patsy Lager MD   Encounter Date: 09/24/2017  PT End of Session - 09/24/17 1915    Visit Number  24    Number of Visits  32    Date for PT Re-Evaluation  10/22/17    PT Start Time  1810    PT Stop Time  1900    PT Time Calculation (min)  50 min    Activity Tolerance  Patient tolerated treatment well;Patient limited by pain    Behavior During Therapy  [facility] for tasks assessed/performed       Past Medical History:  Diagnosis Date  . Abnormal Pap smear   . Cervical disc disease    Cervical and lumbar disc disease  . GERD (gastroesophageal reflux disease)   . History of iron deficiency   . Hypertension   . Multiple sclerosis (HCC)   . Personal history of colonic polyps   . Reactive airway disease   . Vitamin D deficiency     Past Surgical History:  Procedure Laterality Date  . BREAST REDUCTION SURGERY    . REDUCTION MAMMAPLASTY Bilateral 2002  . TOTAL ABDOMINAL HYSTERECTOMY  2003   Right Oophrectomy    There were no vitals filed for this visit.  Subjective Assessment - 09/24/17 1818    Subjective  Patient reports she is having increased soreness today for no apparent reason.    Pertinent History  May 2018 patient reports her right knee began hurting her and caused her to limp with walking, no apparent reason. S/P right knee arthroscopy 06/21/2017 for partial lateral meniscectomy and chondroplasty    Limitations  Standing;House hold activities;Walking;Sitting;Other (comment)    Diagnostic tests  MRI right knee    Patient Stated Goals  decrease pain and be able to walk with less difficulty and sit without pain    Currently in Pain?  Yes    Pain Score  3     Pain Location  Knee     Pain Orientation  Right;Medial    Pain Type  Surgical pain 06/21/2017   06/21/2017   Pain Onset  More than a month ago    Pain Frequency  Intermittent         Objective: Gait: ambulating with improved cadence, no AD  Treatment: Therapeutic exercise:patient performed exercises with verbal, tactile cues and demonstration of therapist: goal: independent with home program, improve LEFS, strength Leg press 45# x 15 reps short arc to avoid increased pain/strain on knee, 55# x 15 reps, 65# x 15 reps, 25# x 15 reps single leg press each LE Staggered stance on balance stone stance with weight bearing LE in frontposition for partial single leg standing exercise: bilateral pull at OMEGA 10# x 15 reps each LE Balance stone staggered stance with one foot on pad and pulling cable with one hand on same side as LE and opposite side LE x 10 reps each; 5# Knee flexion with red resistive band with VC and assistance x 15 reps   Modalities: Electrical stimulation: Russian stim. 10/10 cycle applied (2) electrodes to quadriceps/VMO right LE with patient reclined with LE supported on pillow; pt. Performing quad sets/knee extension with each cycle; goal muscle re education High volt estim.clincial program for muscle spasms  (2) electrodes applied to  medial and lateral aspect of right knee intensity to tolerance with patient in reclined position with LE supported on pillow goal: pain, spasms Ice pack applied to knee with estim; goal; pain: no adverse reactions noted  Nustep warm up independent Level#5(unbilled time) x 10 min.  Patient response to treatment; Patient demonstrated improvement with strength with leg press and improved motor control with exercises with repetition and minimal VC. Patient reported decreased soreness to mild and able to walk with much less pain and soreness in knee at end of session.        PT Education - 09/24/17 1821    Education provided  Yes    Education Details  exercise  instruction    Person(s) Educated  Patient    Methods  Explanation;Demonstration;Verbal cues    Comprehension  Verbalized understanding;Returned demonstration;Verbal cues required          PT Long Term Goals - 08/27/17 1459      PT LONG TERM GOAL #1   Title  Patient will demonstrate improved function right LE for daily activities including sitting, standing, walking with LEFS score of 60/80     Baseline  LEFS 20/80; LEFS 35/80 08/06/2017; LEFS 08/23/17 40/80    Status  Revised    Target Date  10/22/17      PT LONG TERM GOAL #2   Title  Patient will be independent with home program for flexibility, strengthening and pain control  to allow transition to self management once discharged from physical therapy    Baseline  limited knowledge, requires cuing and guidance for appropriate exercise and performance    Status  Revised    Target Date  10/22/17      PT LONG TERM GOAL #3   Title  Patient will be independent with ambulation without AD with normal gait pattern and gait speed for improved function with community ambulation    Baseline  crutches, PWB right LE    Status  Revised    Target Date  10/22/17      PT LONG TERM GOAL #4   Title  Patient will improve AROM right knee flexion to WNL with minimal to no pain to improve transfers and stair climbing    Baseline  right knee flexion 0-75; 08/06/2017 0-110/115; 08/27/17 0-115/117    Status  Revised    Target Date  10/22/17            Plan - 09/24/17 1932    Clinical Impression Statement  Patient with increased soreness right knee that decreased with modalities. She is progressing steadily with goals with current treatment and should continue to progress with additional physical therapy intervention.    Rehab Potential  Good    Clinical Impairments Affecting Rehab Potential  (+)motivated, active lifestyle prior level of function(-)comorbidities MS, prior knee pain     PT Frequency  2x / week    PT Duration  8 weeks    PT  Treatment/Interventions  Iontophoresis 4mg /ml Dexamethasone;Therapeutic activities;Therapeutic exercise;Moist Heat;Manual techniques;Patient/family education;Cryotherapy;Neuromuscular re-education;Ultrasound;Electrical Stimulation    PT Next Visit Plan  electrical stimulation for pain control, meuromuscular re-education    PT Home Exercise Plan  hip, knee quad sets, guleal sets, hip adduction with ball and glute sets; use of ice for pain control, elevation for control of swelling       Patient will benefit from skilled therapeutic intervention in order to improve the following deficits and impairments:  Pain, Decreased strength, Difficulty walking, Decreased range of motion, Decreased endurance, Impaired  perceived functional ability, Decreased activity tolerance  Visit Diagnosis: Acute pain of right knee  Stiffness of right knee, not elsewhere classified  Difficulty in walking, not elsewhere classified  Muscle weakness (generalized)     Problem List Patient Active Problem List   Diagnosis Date Noted  . Left shoulder pain 04/24/2017  . Right knee pain 04/24/2017  . Hemorrhoids 08/02/2016  . Low back pain 05/17/2016  . Right hip pain 05/17/2016  . Chest tightness 03/01/2016  . Rash 01/31/2015  . Health care maintenance 01/31/2015  . Left knee pain 10/12/2013  . GERD (gastroesophageal reflux disease) 12/29/2012  . Abnormal Pap smear of cervix 12/29/2012  . Essential hypertension, benign 12/25/2012  . Anemia 12/25/2012  . Degeneration of lumbar or lumbosacral intervertebral disc 12/25/2012  . Hypercholesterolemia 12/25/2012  . Vitamin D deficiency 12/25/2012  . Multiple sclerosis (HCC) 12/25/2012    [name] PT 09/25/2017, 10:34 PM  [place] [facility]IC[facility] [address], [place], [address] Phone: [phone]   Fax:  [phone]  Name: ELEESHA PURKEY MRN: [id number] Date of Birth: Nov 23, 1955

## 2017-09-26 ENCOUNTER — Encounter: Payer: Self-pay | Admitting: Physical Therapy

## 2017-09-26 ENCOUNTER — Ambulatory Visit: Payer: BC Managed Care – PPO | Admitting: Physical Therapy

## 2017-09-26 DIAGNOSIS — R262 Difficulty in walking, not elsewhere classified: Secondary | ICD-10-CM

## 2017-09-26 DIAGNOSIS — M6281 Muscle weakness (generalized): Secondary | ICD-10-CM

## 2017-09-26 DIAGNOSIS — M25561 Pain in right knee: Secondary | ICD-10-CM

## 2017-09-26 DIAGNOSIS — M25661 Stiffness of right knee, not elsewhere classified: Secondary | ICD-10-CM

## 2017-09-26 NOTE — Therapy (Signed)
[place] [facility] [address] [place], [address] Phone: [phone]   Fax:  [phone]  Physical Therapy Treatment  Patient Details  Name: Jackie White MRN: [id number] Date of Birth: [birth year] Referring Provider: Patsy Lager MD   Encounter Date: 09/26/2017  PT End of Session - 09/26/17 1822    Visit Number  25    Number of Visits  32    Date for PT Re-Evaluation  10/22/17    PT Start Time  1818    PT Stop Time  1908    PT Time Calculation (min)  50 min    Activity Tolerance  Patient tolerated treatment well;Patient limited by pain    Behavior During Therapy  [facility] for tasks assessed/performed       Past Medical History:  Diagnosis Date  . Abnormal Pap smear   . Cervical disc disease    Cervical and lumbar disc disease  . GERD (gastroesophageal reflux disease)   . History of iron deficiency   . Hypertension   . Multiple sclerosis (HCC)   . Personal history of colonic polyps   . Reactive airway disease   . Vitamin D deficiency     Past Surgical History:  Procedure Laterality Date  . BREAST REDUCTION SURGERY    . REDUCTION MAMMAPLASTY Bilateral 2002  . TOTAL ABDOMINAL HYSTERECTOMY  2003   Right Oophrectomy    There were no vitals filed for this visit.  Subjective Assessment - 09/26/17 1819    Subjective  Patient reports her knee is doing much better and not as painful or hot today.    Pertinent History  May 2018 patient reports her right knee began hurting her and caused her to limp with walking, no apparent reason. S/P right knee arthroscopy 06/21/2017 for partial lateral meniscectomy and chondroplasty    Limitations  Standing;House hold activities;Walking;Sitting;Other (comment)    Diagnostic tests  MRI right knee    Patient Stated Goals  decrease pain and be able to walk with less difficulty and sit without pain    Currently in Pain?  Yes    Pain Score  3     Pain Location  Knee    Pain Orientation  Right;Medial    Pain Descriptors / Indicators  Aching;Tightness;Sore    Pain Type  Surgical pain 06/21/2017    Pain Onset  More than a month ago    Pain Frequency  Intermittent          Objective: Gait: ambulating with improved cadence, no AD  Treatment: Therapeutic exercise:patient performed exercises with verbal, tactile cues and demonstration of therapist: goal: independent with home program, improve LEFS, strength Leg press 45# x 25 reps short arc to avoid increased pain/strain on knee, 55# x 15 reps, 65# x 15 reps, 25# x 25 reps single leg press each LE Staggered stancewith weight bearing LE in frontposition for partial single leg standing exercise: bilateral pull at OMEGA 10# x 15 reps each LE Staggeredstance with one foot balance and opposite stabilizing and pulling cable with one hand on same side as LE and opposite side LE x 10 reps each; 5# Seated core/hip flexion isometric through 45cm stability ball x 10 reps each LE 5 second holds   Modalities: Electrical stimulation: Russian stim. 10/10 cycle applied (2) electrodes to quadriceps/VMO right LE with patient reclined with LE supported on pillow; pt. Performing quad sets/knee extension with each cycle; goal muscle re education High volt estim.clincial program for  muscle spasms  (2) electrodes applied to medial and lateral aspect of right knee intensity to tolerance with patient in reclined position with LE supported on pillow goal: pain, spasms Ice pack applied to knee with estim; goal; pain: no adverse reactions noted  Nustep warm up independent Level#5(unbilled time) x 10 min.  Patient response to treatment;Patient demonstrated improved stability with exercises with repetition, minimal VC and demonstration. Mild soreness reported with exercises, decreased following estim/ice.     PT Education - 09/26/17 1821    Education provided  Yes    Education Details  exercise instruction    Person(s)  Educated  Patient    Methods  Explanation;Demonstration;Verbal cues    Comprehension  Verbalized understanding;Returned demonstration;Verbal cues required          PT Long Term Goals - 08/27/17 1459      PT LONG TERM GOAL #1   Title  Patient will demonstrate improved function right LE for daily activities including sitting, standing, walking with LEFS score of 60/80     Baseline  LEFS 20/80; LEFS 35/80 08/06/2017; LEFS 08/23/17 40/80    Status  Revised    Target Date  10/22/17      PT LONG TERM GOAL #2   Title  Patient will be independent with home program for flexibility, strengthening and pain control  to allow transition to self management once discharged from physical therapy    Baseline  limited knowledge, requires cuing and guidance for appropriate exercise and performance    Status  Revised    Target Date  10/22/17      PT LONG TERM GOAL #3   Title  Patient will be independent with ambulation without AD with normal gait pattern and gait speed for improved function with community ambulation    Baseline  crutches, PWB right LE    Status  Revised    Target Date  10/22/17      PT LONG TERM GOAL #4   Title  Patient will improve AROM right knee flexion to WNL with minimal to no pain to improve transfers and stair climbing    Baseline  right knee flexion 0-75; 08/06/2017 0-110/115; 08/27/17 0-115/117    Status  Revised    Target Date  10/22/17            Plan - 09/26/17 1823    Clinical Impression Statement  Patient is progressing steadily with goals with decreasing wamth and pain in right knee.  She continues to require guidance and minimal cuing to perform exercises with correct positioning and technique and should continue to progress with additional physical therapy intervention.   Rehab Potential  Good    Clinical Impairments Affecting Rehab Potential  (+)motivated, active lifestyle prior level of function(-)comorbidities MS, prior knee pain     PT Frequency  2x / week     PT Duration  8 weeks    PT Treatment/Interventions  Iontophoresis 4mg /ml Dexamethasone;Therapeutic activities;Therapeutic exercise;Moist Heat;Manual techniques;Patient/family education;Cryotherapy;Neuromuscular re-education;Ultrasound;Electrical Stimulation    PT Next Visit Plan  electrical stimulation for pain control, meuromuscular re-education    PT Home Exercise Plan  hip, knee quad sets, guleal sets, hip adduction with ball and glute sets; use of ice for pain control, elevation for control of swelling       Patient will benefit from skilled therapeutic intervention in order to improve the following deficits and impairments:  Pain, Decreased strength, Difficulty walking, Decreased range of motion, Decreased endurance, Impaired perceived functional ability, Decreased activity tolerance  Visit Diagnosis: Acute pain of right knee  Stiffness of right knee, not elsewhere classified  Difficulty in walking, not elsewhere classified  Muscle weakness (generalized)     Problem List Patient Active Problem List   Diagnosis Date Noted  . Left shoulder pain 04/24/2017  . Right knee pain 04/24/2017  . Hemorrhoids 08/02/2016  . Low back pain 05/17/2016  . Right hip pain 05/17/2016  . Chest tightness 03/01/2016  . Rash 01/31/2015  . Health care maintenance 01/31/2015  . Left knee pain 10/12/2013  . GERD (gastroesophageal reflux disease) 12/29/2012  . Abnormal Pap smear of cervix 12/29/2012  . Essential hypertension, benign 12/25/2012  . Anemia 12/25/2012  . Degeneration of lumbar or lumbosacral intervertebral disc 12/25/2012  . Hypercholesterolemia 12/25/2012  . Vitamin D deficiency 12/25/2012  . Multiple sclerosis (HCC) 12/25/2012    [name] PT 09/27/2017, 11:32 PM  [place] [facility] SPORTS MEDICINE [address], [place], [address] Phone: [phone]   Fax:  [phone]  Name: Jackie White MRN: [id number] Date of  Birth: 05/23/1956

## 2017-09-28 ENCOUNTER — Ambulatory Visit
Admission: RE | Admit: 2017-09-28 | Discharge: 2017-09-28 | Disposition: A | Discharge status: Home / Self Care | Payer: BC Managed Care – PPO | Source: Ambulatory Visit | Attending: Internal Medicine | Admitting: Internal Medicine

## 2017-09-28 ENCOUNTER — Other Ambulatory Visit: Payer: Self-pay | Admitting: Internal Medicine

## 2017-09-28 DIAGNOSIS — Z1239 Encounter for other screening for malignant neoplasm of breast: Secondary | ICD-10-CM

## 2017-09-28 DIAGNOSIS — Z1231 Encounter for screening mammogram for malignant neoplasm of breast: Secondary | ICD-10-CM | POA: Insufficient documentation

## 2017-10-01 ENCOUNTER — Ambulatory Visit: Payer: BC Managed Care – PPO | Admitting: Physical Therapy

## 2017-10-03 ENCOUNTER — Other Ambulatory Visit: Payer: Self-pay

## 2017-10-03 ENCOUNTER — Encounter: Payer: Self-pay | Admitting: Physical Therapy

## 2017-10-03 ENCOUNTER — Ambulatory Visit: Payer: BC Managed Care – PPO | Admitting: Physical Therapy

## 2017-10-03 DIAGNOSIS — M25561 Pain in right knee: Secondary | ICD-10-CM

## 2017-10-03 DIAGNOSIS — R262 Difficulty in walking, not elsewhere classified: Secondary | ICD-10-CM

## 2017-10-03 DIAGNOSIS — M25661 Stiffness of right knee, not elsewhere classified: Secondary | ICD-10-CM

## 2017-10-03 DIAGNOSIS — M6281 Muscle weakness (generalized): Secondary | ICD-10-CM

## 2017-10-03 NOTE — Therapy (Signed)
[place] [facility] [address], [place], [address] Phone: [phone]   Fax:  [phone]  Physical Therapy Treatment  Patient Details  Name: Jackie White MRN: [id number] Date of Birth: 11/16/1956 Referring Provider: Patsy Lager MD   Encounter Date: 10/03/2017  PT End of Session - 10/03/17 1106    Visit Number  26    Number of Visits  32    Date for PT Re-Evaluation  10/22/17    PT Start Time  1058    PT Stop Time  1158    PT Time Calculation (min)  60 min    Activity Tolerance  Patient tolerated treatment well;Patient limited by pain    Behavior During Therapy  [facility] for tasks assessed/performed       Past Medical History:  Diagnosis Date  . Abnormal Pap smear   . Cervical disc disease    Cervical and lumbar disc disease  . GERD (gastroesophageal reflux disease)   . History of iron deficiency   . Hypertension   . Multiple sclerosis (HCC)   . Personal history of colonic polyps   . Reactive airway disease   . Vitamin D deficiency     Past Surgical History:  Procedure Laterality Date  . BREAST REDUCTION SURGERY    . REDUCTION MAMMAPLASTY Bilateral 2002  . TOTAL ABDOMINAL HYSTERECTOMY  2003   Right Oophrectomy    There were no vitals filed for this visit.  Subjective Assessment - 10/03/17 1100    Subjective  Patient reports her knee is much improved with less warmth and pain today.    Pertinent History  May 2018 patient reports her right knee began hurting her and caused her to limp with walking, no apparent reason. S/P right knee arthroscopy 06/21/2017 for partial lateral meniscectomy and chondroplasty    Limitations  Standing;House hold activities;Walking;Sitting;Other (comment)    Diagnostic tests  MRI right knee    Patient Stated Goals  decrease pain and be able to walk with less difficulty and sit without pain    Currently in Pain?  No/denies         Objective: Gait: ambulating with  improved cadence, no AD Palpation: no significant warmth noted right knee Strength: decreased right knee extension, hip abduction, extension strength as compared to left LE  Treatment: Therapeutic exercise:patient performed exercises with verbal, tactile cues and demonstration of therapist: goal: independent with home program, improve LEFS, strength Leg press45# x 25 reps short arc to avoid increased pain/strain on knee, 65# x 20 reps,35# x12 -15repssingle leg presseach LE Staggered stancewith weight bearing LE in frontposition for partial single leg standing exercise: bilateral pull at OMEGA 10# x 15 reps each LE straight arm pull down 15# x 15 reps Seated knee flexion with green resistive band 2 x 15 reps Sit to stand from elevated surface x 10 reps with demonstration and repetition Seated core/hip flexion isometric through 45cm stability ball x 10 reps each LE 5 second holds  Modalities: Electrical stimulation: 20 min.: Russian stim. 10/10 cycle applied (2) electrodes to quadriceps/VMOrightLE with patient reclined with LE supported on pillow; pt. Performing quad sets/knee extension with each cycle; goal muscle re education High volt estim.clincial program for muscle spasms (2) electrodes applied to medial and lateral aspect of right kneeintensity to tolerance with patient in reclined position with LE supported on pillow goal: pain, spasms Ice pack applied to knee with estim; goal; pain: no adverse reactions noted  Nustep warm up  independent Level #5(unbilled time) x 10 min.  Patient response to treatment;Patient demonstrated good alignment, technique with exercises with minimal cuing and guidance. Mild soreness right knee reported with exercises, no worse at end of session.        PT Education - 10/03/17 1115    Education provided  Yes    Education Details  exercise instruction    Person(s) Educated  Patient    Methods  Explanation;Verbal cues;Demonstration     Comprehension  Verbalized understanding;Returned demonstration;Verbal cues required          PT Long Term Goals - 08/27/17 1459      PT LONG TERM GOAL #1   Title  Patient will demonstrate improved function right LE for daily activities including sitting, standing, walking with LEFS score of 60/80     Baseline  LEFS 20/80; LEFS 35/80 08/06/2017; LEFS 08/23/17 40/80    Status  Revised    Target Date  10/22/17      PT LONG TERM GOAL #2   Title  Patient will be independent with home program for flexibility, strengthening and pain control  to allow transition to self management once discharged from physical therapy    Baseline  limited knowledge, requires cuing and guidance for appropriate exercise and performance    Status  Revised    Target Date  10/22/17      PT LONG TERM GOAL #3   Title  Patient will be independent with ambulation without AD with normal gait pattern and gait speed for improved function with community ambulation    Baseline  crutches, PWB right LE    Status  Revised    Target Date  10/22/17      PT LONG TERM GOAL #4   Title  Patient will improve AROM right knee flexion to WNL with minimal to no pain to improve transfers and stair climbing    Baseline  right knee flexion 0-75; 08/06/2017 0-110/115; 08/27/17 0-115/117    Status  Revised    Target Date  10/22/17            Plan - 10/03/17 1106    Clinical Impression Statement  Patient is progressing steadily with goals with decreased warmth and pain in right knee. Improved gait pattern with increased weight shift to right and improved hip/knee flexion..    Rehab Potential  Good    Clinical Impairments Affecting Rehab Potential  (+)motivated, active lifestyle prior level of function(-)comorbidities MS, prior knee pain     PT Frequency  2x / week    PT Duration  8 weeks    PT Treatment/Interventions  Iontophoresis 4mg /ml Dexamethasone;Therapeutic activities;Therapeutic exercise;Moist Heat;Manual  techniques;Patient/family education;Cryotherapy;Neuromuscular re-education;Ultrasound;Electrical Stimulation    PT Next Visit Plan  electrical stimulation for pain control, meuromuscular re-education    PT Home Exercise Plan  hip, knee quad sets, guleal sets, hip adduction with ball and glute sets; use of ice for pain control, elevation for control of swelling       Patient will benefit from skilled therapeutic intervention in order to improve the following deficits and impairments:  Pain, Decreased strength, Difficulty walking, Decreased range of motion, Decreased endurance, Impaired perceived functional ability, Decreased activity tolerance  Visit Diagnosis: Acute pain of right knee  Stiffness of right knee, not elsewhere classified  Difficulty in walking, not elsewhere classified  Muscle weakness (generalized)     Problem List Patient Active Problem List   Diagnosis Date Noted  . Left shoulder pain 04/24/2017  . Right knee  pain 04/24/2017  . Hemorrhoids 08/02/2016  . Low back pain 05/17/2016  . Right hip pain 05/17/2016  . Chest tightness 03/01/2016  . Rash 01/31/2015  . Health care maintenance 01/31/2015  . Left knee pain 10/12/2013  . GERD (gastroesophageal reflux disease) 12/29/2012  . Abnormal Pap smear of cervix 12/29/2012  . Essential hypertension, benign 12/25/2012  . Anemia 12/25/2012  . Degeneration of lumbar or lumbosacral intervertebral disc 12/25/2012  . Hypercholesterolemia 12/25/2012  . Vitamin D deficiency 12/25/2012  . Multiple sclerosis (HCC) 12/25/2012    Beacher MayBrooks, Marie PT 10/03/2017, 7:16 PM  [facility] [address] [place], [place], [address] Phone: [phone]   Fax:  [phone]  Name: Vaughan BastaMarida J Monda MRN: [id number] Date of Birth: [birth year]

## 2017-10-08 ENCOUNTER — Encounter: Payer: Self-pay | Admitting: Physical Therapy

## 2017-10-08 ENCOUNTER — Other Ambulatory Visit: Payer: Self-pay

## 2017-10-08 ENCOUNTER — Ambulatory Visit: Payer: BC Managed Care – PPO | Admitting: Physical Therapy

## 2017-10-08 DIAGNOSIS — M25661 Stiffness of right knee, not elsewhere classified: Secondary | ICD-10-CM

## 2017-10-08 DIAGNOSIS — M25561 Pain in right knee: Secondary | ICD-10-CM | POA: Diagnosis not present

## 2017-10-08 DIAGNOSIS — R262 Difficulty in walking, not elsewhere classified: Secondary | ICD-10-CM

## 2017-10-08 DIAGNOSIS — M6281 Muscle weakness (generalized): Secondary | ICD-10-CM

## 2017-10-08 NOTE — Therapy (Signed)
[place] [facility] [address], [place], [address] Phone: [phone]   Fax:  [phone]  Physical Therapy Treatment  Patient Details  Name: Jackie White MRN: [id number] Date of Birth: April 30, 1956 Referring Provider: Patsy Lager MD   Encounter Date: 10/08/2017  PT End of Session - 10/08/17 1104    Visit Number  27    Number of Visits  32    Date for PT Re-Evaluation  10/22/17    PT Start Time  1059    PT Stop Time  1152    PT Time Calculation (min)  53 min    Activity Tolerance  Patient tolerated treatment well;Patient limited by pain    Behavior During Therapy  [facility] for tasks assessed/performed       Past Medical History:  Diagnosis Date  . Abnormal Pap smear   . Cervical disc disease    Cervical and lumbar disc disease  . GERD (gastroesophageal reflux disease)   . History of iron deficiency   . Hypertension   . Multiple sclerosis (HCC)   . Personal history of colonic polyps   . Reactive airway disease   . Vitamin D deficiency     Past Surgical History:  Procedure Laterality Date  . BREAST REDUCTION SURGERY    . REDUCTION MAMMAPLASTY Bilateral 2002  . TOTAL ABDOMINAL HYSTERECTOMY  2003   Right Oophrectomy    There were no vitals filed for this visit.  Subjective Assessment - 10/08/17 1101    Subjective  Patient reports her knee is much improved with less warmth and pain today.    Pertinent History  May 2018 patient reports her right knee began hurting her and caused her to limp with walking, no apparent reason. S/P right knee arthroscopy 06/21/2017 for partial lateral meniscectomy and chondroplasty    Limitations  Standing;House hold activities;Walking;Sitting;Other (comment)    Diagnostic tests  MRI right knee    Patient Stated Goals  decrease pain and be able to walk with less difficulty and sit without pain    Currently in Pain?  Yes    Pain Score  2     Pain Location  Knee    Pain  Orientation  Right;Medial;Posterior    Pain Type  Surgical pain 06/21/2017    Pain Onset  More than a month ago    Pain Frequency  Intermittent       Objective: Gait: ambulating independently with gait pattern WNL Palpation: no significant warmth noted right knee, mild anterior aspect of knee  Treatment: Therapeutic exercise:patient performed exercises with verbal, tactile cues and demonstration of therapist: goal: independent with home program, improve LEFS, strength Leg press45# 2 x25 reps short arc to avoid increased pain/strain on knee, 65# x 20 reps,35# 2 x15repssingle leg presseach LE Staggered stancewith weight bearing LE in frontposition for partial single leg standing exercise: bilateral pull at OMEGA 10# x 15 reps each LE straight arm pull down 15# x 15 reps Seated knee flexion with red (doubled) resistive band 2 x 15 reps Sit to stand from elevated surface 3  x 5 reps with demonstration and repetition  Modalities: Electrical stimulation: 15 min.: Russian stim. 10/10 cycle applied (2) electrodes to quadriceps/VMOrightLE with patient reclined with LE supported on pillow; pt. Performing quad sets/knee extension with each cycle; goal muscle re education High volt estim.clincial program for muscle spasms (2) electrodes applied to medial and lateral aspect of right kneeintensity to tolerance with patient in reclined position  with LE supported on pillow goal: pain, spasms Ice pack applied to knee with estim; goal; pain: no adverse reactions noted  Nustep warm up independent Level #5(unbilled time) x 10 min.  Patient response to treatment;Patient demonstrated good technique with proper hip/knee alignment with minimal cuing. Improved endurance noted with leg press, increased reps with minimal rest between sets. Mild soreness right knee reported with exercises.      PT Education - 10/08/17 1103    Education provided  Yes    Education Details  exercise instruction     Person(s) Educated  Patient    Methods  Explanation;Verbal cues    Comprehension  Verbalized understanding;Verbal cues required          PT Long Term Goals - 08/27/17 1459      PT LONG TERM GOAL #1   Title  Patient will demonstrate improved function right LE for daily activities including sitting, standing, walking with LEFS score of 60/80     Baseline  LEFS 20/80; LEFS 35/80 08/06/2017; LEFS 08/23/17 40/80    Status  Revised    Target Date  10/22/17      PT LONG TERM GOAL #2   Title  Patient will be independent with home program for flexibility, strengthening and pain control  to allow transition to self management once discharged from physical therapy    Baseline  limited knowledge, requires cuing and guidance for appropriate exercise and performance    Status  Revised    Target Date  10/22/17      PT LONG TERM GOAL #3   Title  Patient will be independent with ambulation without AD with normal gait pattern and gait speed for improved function with community ambulation    Baseline  crutches, PWB right LE    Status  Revised    Target Date  10/22/17      PT LONG TERM GOAL #4   Title  Patient will improve AROM right knee flexion to WNL with minimal to no pain to improve transfers and stair climbing    Baseline  right knee flexion 0-75; 08/06/2017 0-110/115; 08/27/17 0-115/117    Status  Revised    Target Date  10/22/17            Plan - 10/08/17 1106    Clinical Impression Statement  Patient continues to progress steadily towards goals with decreased warmth and pain right knee. chief concern is feeling stiffness in her knee following prolonged sitting or on waking in am.     Rehab Potential  Good    Clinical Impairments Affecting Rehab Potential  (+)motivated, active lifestyle prior level of function(-)comorbidities MS, prior knee pain     PT Frequency  2x / week    PT Duration  8 weeks    PT Treatment/Interventions  Iontophoresis 4mg /ml Dexamethasone;Therapeutic  activities;Therapeutic exercise;Moist Heat;Manual techniques;Patient/family education;Cryotherapy;Neuromuscular re-education;Ultrasound;Electrical Stimulation    PT Next Visit Plan  electrical stimulation for pain control, meuromuscular re-education    PT Home Exercise Plan  hip, knee quad sets, guleal sets, hip adduction with ball and glute sets; use of ice for pain control, elevation for control of swelling       Patient will benefit from skilled therapeutic intervention in order to improve the following deficits and impairments:  Pain, Decreased strength, Difficulty walking, Decreased range of motion, Decreased endurance, Impaired perceived functional ability, Decreased activity tolerance  Visit Diagnosis: Acute pain of right knee  Stiffness of right knee, not elsewhere classified  Difficulty in walking,  not elsewhere classified  Muscle weakness (generalized)     Problem List Patient Active Problem List   Diagnosis Date Noted  . Left shoulder pain 04/24/2017  . Right knee pain 04/24/2017  . Hemorrhoids 08/02/2016  . Low back pain 05/17/2016  . Right hip pain 05/17/2016  . Chest tightness 03/01/2016  . Rash 01/31/2015  . Health care maintenance 01/31/2015  . Left knee pain 10/12/2013  . GERD (gastroesophageal reflux disease) 12/29/2012  . Abnormal Pap smear of cervix 12/29/2012  . Essential hypertension, benign 12/25/2012  . Anemia 12/25/2012  . Degeneration of lumbar or lumbosacral intervertebral disc 12/25/2012  . Hypercholesterolemia 12/25/2012  . Vitamin D deficiency 12/25/2012  . Multiple sclerosis (HCC) 12/25/2012    Beacher MayBrooks, Alysse Rathe PT 10/08/2017, 12:27 PM  [place] [facility] [address] [place], [place], [address] Phone: [phone]   Fax:  [phone]  Name: Jackie White MRN: [id number] Date of Birth: 06-25-1956

## 2017-10-10 ENCOUNTER — Encounter: Payer: Self-pay | Admitting: Physical Therapy

## 2017-10-10 ENCOUNTER — Ambulatory Visit: Payer: BC Managed Care – PPO | Admitting: Physical Therapy

## 2017-10-10 DIAGNOSIS — M6281 Muscle weakness (generalized): Secondary | ICD-10-CM

## 2017-10-10 DIAGNOSIS — M25561 Pain in right knee: Secondary | ICD-10-CM

## 2017-10-10 DIAGNOSIS — M25661 Stiffness of right knee, not elsewhere classified: Secondary | ICD-10-CM

## 2017-10-10 DIAGNOSIS — R262 Difficulty in walking, not elsewhere classified: Secondary | ICD-10-CM

## 2017-10-10 NOTE — Therapy (Signed)
[place] [facility]IC[facility] [address] [place], [place], [address] Phone: [phone]   Fax:  [phone]  Physical Therapy Treatment  Patient Details  Name: Jackie White MRN: [id number] Date of Birth: 1956/07/30 Referring Provider: Patsy Lagerreighton, Alex MD   Encounter Date: 10/10/2017  PT End of Session - 10/10/17 1907    Visit Number  28    Number of Visits  32    Date for PT Re-Evaluation  10/22/17    PT Start Time  1802    PT Stop Time  1900    PT Time Calculation (min)  58 min    Activity Tolerance  Patient tolerated treatment well;Patient limited by pain    Behavior During Therapy  [facility]       Past Medical History:  Diagnosis Date  . Abnormal Pap smear   . Cervical disc disease    Cervical and lumbar disc disease  . GERD (gastroesophageal reflux disease)   . History of iron deficiency   . Hypertension   . Multiple sclerosis (HCC)   . Personal history of colonic polyps   . Reactive airway disease   . Vitamin D deficiency     Past Surgical History:  Procedure Laterality Date  . BREAST REDUCTION SURGERY    . REDUCTION MAMMAPLASTY Bilateral 2002  . TOTAL ABDOMINAL HYSTERECTOMY  2003   Right Oophrectomy    There were no vitals filed for this visit.  Subjective Assessment - 10/10/17 1818    Subjective  Patient reports her knee is much improved with less warmth and pain today.    Pertinent History  May 2018 patient reports her right knee began hurting her and caused her to limp with walking, no apparent reason. S/P right knee arthroscopy 06/21/2017 for partial lateral meniscectomy and chondroplasty    Limitations  Standing;House hold activities;Walking;Sitting;Other (comment)    Diagnostic tests  MRI right knee    Patient Stated Goals  decrease pain and be able to walk with less difficulty and sit without pain    Currently in Pain?  Yes    Pain Score  3     Pain Location  Knee    Pain  Orientation  Right    Pain Descriptors / Indicators  Aching    Pain Type  Surgical pain 06/21/2017    Pain Onset  More than a month ago    Pain Frequency  Intermittent       Objective: Gait: ambulating independently with gait pattern WNL Palpation: no significant warmth noted right knee, mild anterior aspect of knee  Treatment: Therapeutic exercise:patient performed exercises with verbal, tactile cues and demonstration of therapist: goal: independent with home program, improve LEFS, strength Leg press45# 2 x25 reps short arc to avoid increased pain/strain on knee, 65# x20reps,35# 2 x15repssingle leg presseach LE Staggered stancewith weight bearing LE in frontposition for partial single leg standing exercise: bilateral pull at OMEGA 15# x 15 reps each LE Single arm pull with staggered stance each side 10# x 15 reps each straight arm pull down 15# x 15 reps Seated knee flexion with red (doubled) resistive band 2 x 15 reps Sit to stand from elevated surface 3  x 5 reps with demonstration and repetition  Modalities: Electrical stimulation:15 min.:Russian stim. 10/10 cycle applied (2) electrodes to quadriceps/VMOrightLE with patient reclined with LE supported on pillow; pt. Performing quad sets/knee extension with each cycle; goal muscle re education High volt estim.clincial program for muscle spasms (  2) electrodes applied to medial and lateral aspect of right kneeintensity to tolerance with patient in reclined position with LE supported on pillow goal: pain, spasms Ice pack applied to knee with estim; goal; pain: no adverse reactions noted  Nustep warm up independent Level #5(unbilled time) x 10 min  Patient response to treatment;Patient demonstrated improved technique with proper hip/knee alignment with minimal cuing. Improved endurance noted with exercises and improved motor control with stanidng balance exercises.        PT Education - 10/10/17 1830     Education provided  Yes    Education Details  exercise instruction    Person(s) Educated  Patient    Methods  Explanation;Verbal cues;Demonstration    Comprehension  Verbalized understanding;Returned demonstration;Verbal cues required          PT Long Term Goals - 08/27/17 1459      PT LONG TERM GOAL #1   Title  Patient will demonstrate improved function right LE for daily activities including sitting, standing, walking with LEFS score of 60/80     Baseline  LEFS 20/80; LEFS 35/80 08/06/2017; LEFS 08/23/17 40/80    Status  Revised    Target Date  10/22/17      PT LONG TERM GOAL #2   Title  Patient will be independent with home program for flexibility, strengthening and pain control  to allow transition to self management once discharged from physical therapy    Baseline  limited knowledge, requires cuing and guidance for appropriate exercise and performance    Status  Revised    Target Date  10/22/17      PT LONG TERM GOAL #3   Title  Patient will be independent with ambulation without AD with normal gait pattern and gait speed for improved function with community ambulation    Baseline  crutches, PWB right LE    Status  Revised    Target Date  10/22/17      PT LONG TERM GOAL #4   Title  Patient will improve AROM right knee flexion to WNL with minimal to no pain to improve transfers and stair climbing    Baseline  right knee flexion 0-75; 08/06/2017 0-110/115; 08/27/17 0-115/117    Status  Revised    Target Date  10/22/17            Plan - 10/10/17 1908    Clinical Impression Statement  Patient continues to progress steaily towards goals with improved ROM to 120 degrees, improving strength and flexiblity right LE.  She should continue to improve strength and endurance with additional physical therapy intervention in order to achieve goals.   Rehab Potential  Good    Clinical Impairments Affecting Rehab Potential  (+)motivated, active lifestyle prior level of  function(-)comorbidities MS, prior knee pain     PT Frequency  2x / week    PT Duration  8 weeks    PT Treatment/Interventions  Iontophoresis 4mg /ml Dexamethasone;Therapeutic activities;Therapeutic exercise;Moist Heat;Manual techniques;Patient/family education;Cryotherapy;Neuromuscular re-education;Ultrasound;Electrical Stimulation    PT Next Visit Plan  electrical stimulation for pain control, meuromuscular re-education    PT Home Exercise Plan  hip, knee quad sets, guleal sets, hip adduction with ball and glute sets; use of ice for pain control, elevation for control of swelling       Patient will benefit from skilled therapeutic intervention in order to improve the following deficits and impairments:  Pain, Decreased strength, Difficulty walking, Decreased range of motion, Decreased endurance, Impaired perceived functional ability, Decreased activity tolerance  Visit Diagnosis: Acute pain of right knee  Stiffness of right knee, not elsewhere classified  Difficulty in walking, not elsewhere classified  Muscle weakness (generalized)     Problem List Patient Active Problem List   Diagnosis Date Noted  . Left shoulder pain 04/24/2017  . Right knee pain 04/24/2017  . Hemorrhoids 08/02/2016  . Low back pain 05/17/2016  . Right hip pain 05/17/2016  . Chest tightness 03/01/2016  . Rash 01/31/2015  . Health care maintenance 01/31/2015  . Left knee pain 10/12/2013  . GERD (gastroesophageal reflux disease) 12/29/2012  . Abnormal Pap smear of cervix 12/29/2012  . Essential hypertension, benign 12/25/2012  . Anemia 12/25/2012  . Degeneration of lumbar or lumbosacral intervertebral disc 12/25/2012  . Hypercholesterolemia 12/25/2012  . Vitamin D deficiency 12/25/2012  . Multiple sclerosis (HCC) 12/25/2012    [name] PT 10/11/2017, 10:10 PM  [facility] PHYSICAL AND SPORTS MEDICINE [address], [place], [address] Phone:  [phone]   Fax:  [phone]  Name: Jackie White MRN: [id number] Date of Birth: 11/02/1956

## 2017-10-15 ENCOUNTER — Ambulatory Visit: Payer: BC Managed Care – PPO | Admitting: Physical Therapy

## 2017-10-15 ENCOUNTER — Encounter: Payer: Self-pay | Admitting: Physical Therapy

## 2017-10-15 DIAGNOSIS — R262 Difficulty in walking, not elsewhere classified: Secondary | ICD-10-CM

## 2017-10-15 DIAGNOSIS — M25561 Pain in right knee: Secondary | ICD-10-CM | POA: Diagnosis not present

## 2017-10-15 DIAGNOSIS — M6281 Muscle weakness (generalized): Secondary | ICD-10-CM

## 2017-10-15 DIAGNOSIS — M25661 Stiffness of right knee, not elsewhere classified: Secondary | ICD-10-CM

## 2017-10-15 NOTE — Therapy (Signed)
[place] [facility] [address] [place], [place], [address] Phone: [phone]   Fax:  [phone]  Physical Therapy Treatment  Patient Details  Name: Jackie White MRN: [id number] Date of Birth: 08-Aug-1956 Referring Provider: Patsy Lagerreighton, Alex MD   Encounter Date: 10/15/2017  PT End of Session - 10/15/17 1832    Visit Number  29    Number of Visits  32    Date for PT Re-Evaluation  10/22/17    PT Start Time  1804    PT Stop Time  1900    PT Time Calculation (min)  56 min    Activity Tolerance  Patient tolerated treatment well;Patient limited by pain    Behavior During Therapy  [facility] for tasks assessed/performed       Past Medical History:  Diagnosis Date  . Abnormal Pap smear   . Cervical disc disease    Cervical and lumbar disc disease  . GERD (gastroesophageal reflux disease)   . History of iron deficiency   . Hypertension   . Multiple sclerosis (HCC)   . Personal history of colonic polyps   . Reactive airway disease   . Vitamin D deficiency     Past Surgical History:  Procedure Laterality Date  . BREAST REDUCTION SURGERY    . REDUCTION MAMMAPLASTY Bilateral 2002  . TOTAL ABDOMINAL HYSTERECTOMY  2003   Right Oophrectomy    There were no vitals filed for this visit.  Subjective Assessment - 10/15/17 1828    Subjective  Patient reports she is improving well with decreased pain right knee. She did a lot of activity with decorating and walking and she took advil to decrease pain/soreness.     Pertinent History  May 2018 patient reports her right knee began hurting her and caused her to limp with walking, no apparent reason. S/P right knee arthroscopy 06/21/2017 for partial lateral meniscectomy and chondroplasty    Limitations  Standing;House hold activities;Walking;Sitting;Other (comment)    Diagnostic tests  MRI right knee    Patient Stated Goals  decrease pain and be able to walk with less difficulty and  sit without pain    Currently in Pain?  No/denies    Pain Onset  More than a month ago          Objective: Gait: ambulatingindependently with gait pattern WNL Palpation: no significant warmth noted right knee, mild medial aspect of knee  Treatment: Therapeutic exercise:patient performed exercises with verbal, tactile cues and demonstration of therapist: goal: independent with home program, improve LEFS, strength Leg press45#x25 reps short arc to avoid increased pain/strain on knee, 65# x25reps,35#x25repssingle leg presseach LE, 75# x 20 reps Staggered stancewith weight bearing LE in frontposition for partial single leg standing exercise: bilateral pull at OMEGA 15# x 15 reps each LE Single arm pull with staggered stance each side 10# x 15 reps each straight arm pull down 20# x 15 reps Bilateral scapular retraction standing stability stance x 15 reps 15# Seated knee flexion withred (doubled)resistive band 2 x 15 reps  Modalities: Electrical stimulation:6815min.:Russian stim. 10/10 cycle applied (2) electrodes to quadriceps/VMOrightLE with patient reclined with LE supported on pillow; pt. Performing quad sets/knee extension with each cycle; goal muscle re education High volt estim.clincial program for muscle spasms (2) electrodes applied to medial and lateral aspect of right kneeintensity to tolerance with patient in reclined position with LE supported on pillow goal: pain, spasms Ice pack applied to knee with estim; goal; pain: no adverse  reactions noted  Nustep warm up independent Level #6(unbilled time) x 10 min  Patient response to treatment; Patient demonstrated improved technique with minimal cuing and is progressing with increased intensity without exacerbation of pain/symptoms in right knee. Improved motor control noted with standing staggered stance exercises.      PT Education - 10/15/17 1905    Education provided  Yes    Education Details   exercise instruction, correct intensity and technique    Person(s) Educated  Patient    Methods  Explanation;Demonstration;Verbal cues    Comprehension  Verbalized understanding;Returned demonstration;Verbal cues required          PT Long Term Goals - 08/27/17 1459      PT LONG TERM GOAL #1   Title  Patient will demonstrate improved function right LE for daily activities including sitting, standing, walking with LEFS score of 60/80     Baseline  LEFS 20/80; LEFS 35/80 08/06/2017; LEFS 08/23/17 40/80    Status  Revised    Target Date  10/22/17      PT LONG TERM GOAL #2   Title  Patient will be independent with home program for flexibility, strengthening and pain control  to allow transition to self management once discharged from physical therapy    Baseline  limited knowledge, requires cuing and guidance for appropriate exercise and performance    Status  Revised    Target Date  10/22/17      PT LONG TERM GOAL #3   Title  Patient will be independent with ambulation without AD with normal gait pattern and gait speed for improved function with community ambulation    Baseline  crutches, PWB right LE    Status  Revised    Target Date  10/22/17      PT LONG TERM GOAL #4   Title  Patient will improve AROM right knee flexion to WNL with minimal to no pain to improve transfers and stair climbing    Baseline  right knee flexion 0-75; 08/06/2017 0-110/115; 08/27/17 0-115/117    Status  Revised    Target Date  10/22/17            Plan - 10/15/17 1906    Clinical Impression Statement  Patient is progressing steadily towards goals with improved strength and motor control right LE. She continues with weakness in right LE quadriceps and hamstring muscles.     Rehab Potential  Good    Clinical Impairments Affecting Rehab Potential  (+)motivated, active lifestyle prior level of function(-)comorbidities MS, prior knee pain     PT Frequency  2x / week    PT Duration  8 weeks    PT  Treatment/Interventions  Iontophoresis 4mg /ml Dexamethasone;Therapeutic activities;Therapeutic exercise;Moist Heat;Manual techniques;Patient/family education;Cryotherapy;Neuromuscular re-education;Ultrasound;Electrical Stimulation    PT Next Visit Plan  electrical stimulation for pain control, meuromuscular re-education ; re assess LEFS, strength, anticipate discharge   PT Home Exercise Plan  hip, knee quad sets, guleal sets, hip adduction with ball and glute sets; use of ice for pain control, elevation for control of swelling       Patient will benefit from skilled therapeutic intervention in order to improve the following deficits and impairments:  Pain, Decreased strength, Difficulty walking, Decreased range of motion, Decreased endurance, Impaired perceived functional ability, Decreased activity tolerance  Visit Diagnosis: Muscle weakness (generalized)  Stiffness of right knee, not elsewhere classified  Difficulty in walking, not elsewhere classified     Problem List Patient Active Problem List   Diagnosis  Date Noted  . Left shoulder pain 04/24/2017  . Right knee pain 04/24/2017  . Hemorrhoids 08/02/2016  . Low back pain 05/17/2016  . Right hip pain 05/17/2016  . Chest tightness 03/01/2016  . Rash 01/31/2015  . Health care maintenance 01/31/2015  . Left knee pain 10/12/2013  . GERD (gastroesophageal reflux disease) 12/29/2012  . Abnormal Pap smear of cervix 12/29/2012  . Essential hypertension, benign 12/25/2012  . Anemia 12/25/2012  . Degeneration of lumbar or lumbosacral intervertebral disc 12/25/2012  . Hypercholesterolemia 12/25/2012  . Vitamin D deficiency 12/25/2012  . Multiple sclerosis (HCC) 12/25/2012    [name] PT 10/16/2017, 11:01 PM  [place] [facility] PHYSICAL AND SPORTS MEDICINE [address], [place], [address] Phone: [phone]   Fax:  [phone]  Name: Jackie White MRN: [id number] Date of Birth:  [birth year]

## 2017-10-17 ENCOUNTER — Ambulatory Visit: Payer: BC Managed Care – PPO | Admitting: Physical Therapy

## 2017-10-22 ENCOUNTER — Encounter: Payer: Self-pay | Admitting: Physical Therapy

## 2017-10-22 ENCOUNTER — Ambulatory Visit: Payer: BC Managed Care – PPO | Attending: Orthopedic Surgery | Admitting: Physical Therapy

## 2017-10-22 DIAGNOSIS — R262 Difficulty in walking, not elsewhere classified: Secondary | ICD-10-CM | POA: Insufficient documentation

## 2017-10-22 DIAGNOSIS — M6281 Muscle weakness (generalized): Secondary | ICD-10-CM | POA: Diagnosis not present

## 2017-10-22 DIAGNOSIS — M25661 Stiffness of right knee, not elsewhere classified: Secondary | ICD-10-CM | POA: Diagnosis present

## 2017-10-22 NOTE — Therapy (Signed)
[place] PHYSICAL AND SPORTS MEDICINE [address], [place], [address] Phone: [phone]   Fax:  [phone]  Physical Therapy Treatment/Discharge Summary  Patient Details  Name: Jackie White MRN: [id number] Date of Birth: July 02, 1956 Referring Provider: Lennox Solders MD   Encounter Date: 10/22/2017   Patient began physical therapy on 06/28/2017 and has attended 30 sessions through 10/22/2017. She has achieved/partially met all goals and is independent in home program for continued self management of pain/symptoms and exercises as instructed. Plan discharge from physical therapy at this time.    PT End of Session - 10/22/17 1339    Visit Number  30    Number of Visits  32    Date for PT Re-Evaluation  10/22/17    PT Start Time  1113    PT Stop Time  1202    PT Time Calculation (min)  49 min    Activity Tolerance  Patient tolerated treatment well;Patient limited by pain    Behavior During Therapy  [facility] for tasks assessed/performed       Past Medical History:  Diagnosis Date  . Abnormal Pap smear   . Cervical disc disease    Cervical and lumbar disc disease  . GERD (gastroesophageal reflux disease)   . History of iron deficiency   . Hypertension   . Multiple sclerosis ([place])   . Personal history of colonic polyps   . Reactive airway disease   . Vitamin D deficiency     Past Surgical History:  Procedure Laterality Date  . BREAST REDUCTION SURGERY    . REDUCTION MAMMAPLASTY Bilateral 2002  . TOTAL ABDOMINAL HYSTERECTOMY  2003   Right Oophrectomy    There were no vitals filed for this visit.  Subjective Assessment - 10/22/17 1127    Subjective  Patient reports she continues to see improvement with ROM and strength. She has seen a great deal of improvement. she continues with difficulty with stair climbing and descending, getting in/out of a car and increased stiffness following prolonged sitting.     Pertinent History   May 2018 patient reports her right knee began hurting her and caused her to limp with walking, no apparent reason. S/P right knee arthroscopy 06/21/2017 for partial lateral meniscectomy and chondroplasty    Limitations  Standing;House hold activities;Walking;Sitting;Other (comment)    Diagnostic tests  MRI right knee    Patient Stated Goals  decrease pain and be able to walk with less difficulty and sit without pain    Currently in Pain?  No/denies       Objective: Gait: ambulatingindependently with gait pattern WNL Palpation: no significant warmth noted right knee, mild medial aspect of knee Outcome measure: LEFS 56/80  Treatment: Therapeutic exercise:patient performed exercises with verbal, tactile cues and demonstration of therapist: goal: independent with home program, improve LEFS, strength Leg press45#x25 reps short arc to avoid increased pain/strain on knee, 65# x25reps,35#x25 repssingle leg presseach LE, 75# x 20 reps Staggered stancewith weight bearing LE in frontposition for partial single leg standing exercise: bilateral pull at [place] 15# x 15 reps each LE straight arm pull down 20# x 15 reps Bilateral scapular retraction standing stability stance x 15 reps 15# Seated knee flexion withmanual resistance multi angle 3 reps   Modalities: Electrical stimulation:77mn.:Russian stim. 10/10 cycle applied (2) electrodes to quadriceps/VMOrightLE with patient reclined with LE supported on pillow; pt. Performing quad sets/knee extension with each cycle; goal muscle re education High volt estim.clincial program for muscle spasms (  2) electrodes applied to medial and lateral aspect of right kneeintensity to tolerance with patient in reclined position with LE supported on pillow goal: pain, spasms Ice pack applied to knee with estim; goal; pain: no adverse reactions noted  Nustep warm up independent Level #6(unbilled time) x 10 min  Patient response to treatment;  Patient demonstrated good alignment and technique with all exercises without cuing. Patient reported decreased soreness at end of session following modalities    PT Education - 10/22/17 1337    Education provided  Yes    Education Details  HEP to continue with at home: resistive band walk, step ups low level with weight in right hand, leg press with resistive bands, diagonal march with weights, SLR     Person(s) Educated  Patient    Methods  Explanation;Verbal cues;Demonstration    Comprehension  Verbalized understanding;Returned demonstration;Verbal cues required          PT Long Term Goals - 10/22/17 1341      PT LONG TERM GOAL #1   Title  Patient will demonstrate improved function right LE for daily activities including sitting, standing, walking with LEFS score of 60/80     Baseline  LEFS 20/80; LEFS 35/80 08/06/2017; LEFS 08/23/17 40/80; LEFS 10/22/17 56/80    Status  Partially Met      PT LONG TERM GOAL #2   Title  Patient will be independent with home program for flexibility, strengthening and pain control  to allow transition to self management once discharged from physical therapy    Baseline  limited knowledge, requires cuing and guidance for appropriate exercise and performance    Status  Achieved      PT LONG TERM GOAL #3   Title  Patient will be independent with ambulation without AD with normal gait pattern and gait speed for improved function with community ambulation    Baseline  crutches, PWB right LE    Status  Achieved      PT LONG TERM GOAL #4   Title  Patient will improve AROM right knee flexion to WNL with minimal to no pain to improve transfers and stair climbing    Baseline  right knee flexion 0-75; 08/06/2017 0-110/115; 08/27/17 0-115/117; 10/22/17 120 degrees    Status  Achieved            Plan - 10/22/17 1340    Clinical Impression Statement  Patient demonstrates good understanding of home program, self managment of symptoms including modifying  exercises to avoid exacerbation of swelling/pain. She has achieved goals and should continue progressing with self managment. Plan discharge from physical therapy at this time.    Rehab Potential  Good    Clinical Impairments Affecting Rehab Potential  (+)motivated, active lifestyle prior level of function(-)comorbidities MS, prior knee pain     PT Frequency  2x / week    PT Duration  8 weeks    PT Treatment/Interventions  Iontophoresis '4mg'$ /ml Dexamethasone;Therapeutic activities;Therapeutic exercise;Moist Heat;Manual techniques;Patient/family education;Cryotherapy;Neuromuscular re-education;Ultrasound;Electrical Stimulation    PT Next Visit Plan  discharge     PT Home Exercise Plan  hip, knee quad sets, guleal sets, hip adduction with ball and glute sets; use of ice for pain control, elevation for control of swelling    Consulted and Agree with Plan of Care  Patient       Patient will benefit from skilled therapeutic intervention in order to improve the following deficits and impairments:  Pain, Decreased strength, Difficulty walking, Decreased range of motion, Decreased  endurance, Impaired perceived functional ability, Decreased activity tolerance  Visit Diagnosis: Muscle weakness (generalized)  Stiffness of right knee, not elsewhere classified  Difficulty in walking, not elsewhere classified     Problem List Patient Active Problem List   Diagnosis Date Noted  . Left shoulder pain 04/24/2017  . Right knee pain 04/24/2017  . Hemorrhoids 08/02/2016  . Low back pain 05/17/2016  . Right hip pain 05/17/2016  . Chest tightness 03/01/2016  . Rash 01/31/2015  . Health care maintenance 01/31/2015  . Left knee pain 10/12/2013  . GERD (gastroesophageal reflux disease) 12/29/2012  . Abnormal Pap smear of cervix 12/29/2012  . Essential hypertension, benign 12/25/2012  . Anemia 12/25/2012  . Degeneration of lumbar or lumbosacral intervertebral disc 12/25/2012  . Hypercholesterolemia  12/25/2012  . Vitamin D deficiency 12/25/2012  . Multiple sclerosis ([place]) 12/25/2012    Jomarie Longs PT 10/22/2017, 1:44 PM  [place] PH[facility] AND [facility]ORTS MEDICINE [address], [place], [address] Phone: [phone]   Fax:  [phone]  Name: Jackie White MRN: [id number] Date of Birth: [birth year]

## 2017-10-24 ENCOUNTER — Ambulatory Visit: Payer: BC Managed Care – PPO | Admitting: Physical Therapy

## 2017-11-14 ENCOUNTER — Other Ambulatory Visit: Payer: Self-pay | Admitting: Internal Medicine

## 2017-12-05 ENCOUNTER — Other Ambulatory Visit (INDEPENDENT_AMBULATORY_CARE_PROVIDER_SITE_OTHER): Payer: BC Managed Care – PPO

## 2017-12-05 DIAGNOSIS — G35 Multiple sclerosis: Secondary | ICD-10-CM

## 2017-12-05 DIAGNOSIS — E7800 Pure hypercholesterolemia, unspecified: Secondary | ICD-10-CM

## 2017-12-05 DIAGNOSIS — D649 Anemia, unspecified: Secondary | ICD-10-CM | POA: Diagnosis not present

## 2017-12-05 DIAGNOSIS — I10 Essential (primary) hypertension: Secondary | ICD-10-CM | POA: Diagnosis not present

## 2017-12-05 DIAGNOSIS — E559 Vitamin D deficiency, unspecified: Secondary | ICD-10-CM

## 2017-12-05 LAB — LIPID PANEL
CHOLESTEROL: 174 mg/dL (ref 0–200)
HDL: 84.5 mg/dL (ref >39.00)
LDL Cholesterol: 78 mg/dL (ref 0–99)
NONHDL: 89.85
TRIGLYCERIDES: 59 mg/dL (ref 0.0–149.0)
Total CHOL/HDL Ratio: 2
VLDL: 11.8 mg/dL (ref 0.0–40.0)

## 2017-12-05 LAB — CBC WITH DIFFERENTIAL/PLATELET
Basophils Absolute: 0.1 10*3/uL (ref 0.0–0.1)
Basophils Relative: 1.1 % (ref 0.0–3.0)
Eosinophils Absolute: 0.2 10*3/uL (ref 0.0–0.7)
Eosinophils Relative: 4.6 % (ref 0.0–5.0)
HCT: 41.5 % (ref 36.0–46.0)
HEMOGLOBIN: 13.3 g/dL (ref 12.0–15.0)
LYMPHS PCT: 32.2 % (ref 12.0–46.0)
Lymphs Abs: 1.6 10*3/uL (ref 0.7–4.0)
MCHC: 32.1 g/dL (ref 30.0–36.0)
MCV: 86.2 fl (ref 78.0–100.0)
MONOS PCT: 10.8 % (ref 3.0–12.0)
Monocytes Absolute: 0.5 10*3/uL (ref 0.1–1.0)
Neutro Abs: 2.5 10*3/uL (ref 1.4–7.7)
Neutrophils Relative %: 51.3 % (ref 43.0–77.0)
Platelets: 328 10*3/uL (ref 150.0–400.0)
RBC: 4.81 Mil/uL (ref 3.87–5.11)
RDW: 15.3 % (ref 11.5–15.5)
WBC: 4.9 10*3/uL (ref 4.0–10.5)

## 2017-12-05 LAB — BASIC METABOLIC PANEL
BUN: 11 mg/dL (ref 6–23)
CHLORIDE: 104 meq/L (ref 96–112)
CO2: 34 mEq/L — ABNORMAL HIGH (ref 19–32)
Calcium: 9.6 mg/dL (ref 8.4–10.5)
Creatinine, Ser: 0.79 mg/dL (ref 0.40–1.20)
GFR: 95.6 mL/min (ref >60.00)
Glucose, Bld: 101 mg/dL — ABNORMAL HIGH (ref 70–99)
POTASSIUM: 4.2 meq/L (ref 3.5–5.1)
Sodium: 143 mEq/L (ref 135–145)

## 2017-12-05 LAB — VITAMIN D 25 HYDROXY (VIT D DEFICIENCY, FRACTURES): VITD: 28.26 ng/mL — ABNORMAL LOW (ref 30.00–100.00)

## 2017-12-05 LAB — HEPATIC FUNCTION PANEL
ALBUMIN: 4.1 g/dL (ref 3.5–5.2)
ALT: 11 U/L (ref 0–35)
AST: 13 U/L (ref 0–37)
Alkaline Phosphatase: 76 U/L (ref 39–117)
Bilirubin, Direct: 0.1 mg/dL (ref 0.0–0.3)
TOTAL PROTEIN: 6.5 g/dL (ref 6.0–8.3)
Total Bilirubin: 0.7 mg/dL (ref 0.2–1.2)

## 2017-12-05 LAB — TSH: TSH: 1.58 u[IU]/mL (ref 0.35–4.50)

## 2017-12-05 LAB — FERRITIN: FERRITIN: 34.7 ng/mL (ref 10.0–291.0)

## 2018-02-21 ENCOUNTER — Other Ambulatory Visit: Payer: Self-pay | Admitting: Internal Medicine

## 2018-02-28 ENCOUNTER — Ambulatory Visit: Payer: BC Managed Care – PPO | Admitting: Internal Medicine

## 2018-02-28 ENCOUNTER — Encounter: Payer: Self-pay | Admitting: Internal Medicine

## 2018-02-28 VITALS — BP 130/78 | HR 63 | Temp 98.7°F | Resp 18 | Wt 231.0 lb

## 2018-02-28 DIAGNOSIS — E7800 Pure hypercholesterolemia, unspecified: Secondary | ICD-10-CM | POA: Diagnosis not present

## 2018-02-28 DIAGNOSIS — E876 Hypokalemia: Secondary | ICD-10-CM | POA: Diagnosis not present

## 2018-02-28 DIAGNOSIS — E8809 Other disorders of plasma-protein metabolism, not elsewhere classified: Secondary | ICD-10-CM | POA: Diagnosis not present

## 2018-02-28 DIAGNOSIS — E559 Vitamin D deficiency, unspecified: Secondary | ICD-10-CM | POA: Diagnosis not present

## 2018-02-28 DIAGNOSIS — D649 Anemia, unspecified: Secondary | ICD-10-CM

## 2018-02-28 DIAGNOSIS — I10 Essential (primary) hypertension: Secondary | ICD-10-CM

## 2018-02-28 DIAGNOSIS — M25561 Pain in right knee: Secondary | ICD-10-CM

## 2018-02-28 DIAGNOSIS — G35 Multiple sclerosis: Secondary | ICD-10-CM | POA: Diagnosis not present

## 2018-02-28 LAB — HEPATIC FUNCTION PANEL
ALT: 13 U/L (ref 0–35)
AST: 14 U/L (ref 0–37)
Albumin: 4 g/dL (ref 3.5–5.2)
Alkaline Phosphatase: 76 U/L (ref 39–117)
BILIRUBIN DIRECT: 0.1 mg/dL (ref 0.0–0.3)
BILIRUBIN TOTAL: 0.6 mg/dL (ref 0.2–1.2)
Total Protein: 6.8 g/dL (ref 6.0–8.3)

## 2018-02-28 LAB — BASIC METABOLIC PANEL
BUN: 10 mg/dL (ref 6–23)
CHLORIDE: 103 meq/L (ref 96–112)
CO2: 33 meq/L — AB (ref 19–32)
Calcium: 9.4 mg/dL (ref 8.4–10.5)
Creatinine, Ser: 0.79 mg/dL (ref 0.40–1.20)
GFR: 95.53 mL/min (ref >60.00)
GLUCOSE: 82 mg/dL (ref 70–99)
POTASSIUM: 3.9 meq/L (ref 3.5–5.1)
SODIUM: 140 meq/L (ref 135–145)

## 2018-02-28 NOTE — Progress Notes (Signed)
Patient ID: Jackie White, female   DOB: [birth year], 62 y.o.   MRN: [id number]   Subjective:    Patient ID: Jackie White, female    DOB: Jan 27, 1956, 62 y.o.   MRN: [id number]  HPI  Patient here for a scheduled follow up.  Followed by Dr Elwyn Reach for her MS.  On ocrelizumab.  Stable.  Recommended imitrex for her migraines.  Has been seeing Dr Roney Mans for her knee.  S/ aspiration.  Helped for a while. Starting to get more swollen now.  Has had lower extremity ultrasound.  Has f/u planned with Dr Roney Mans.  Tries to stay active.  No chest pain.  No sob.  No acid reflux.  No abdominal pain.  Bowels moving.  No urine change.  Overall she feels she is doing relatively well.     Past Medical History:  Diagnosis Date  . Abnormal Pap smear   . Cervical disc disease    Cervical and lumbar disc disease  . GERD (gastroesophageal reflux disease)   . History of iron deficiency   . Hypertension   . Multiple sclerosis (HCC)   . Personal history of colonic polyps   . Reactive airway disease   . Vitamin D deficiency    Past Surgical History:  Procedure Laterality Date  . BREAST REDUCTION SURGERY    . REDUCTION MAMMAPLASTY Bilateral 2002  . TOTAL ABDOMINAL HYSTERECTOMY  2003   Right Oophrectomy   Family History  Problem Relation Age of Onset  . Stroke Mother   . Hypertension Mother   . Heart disease Maternal Grandmother   . Breast cancer Neg Hx    Social History   Socioeconomic History  . Marital status: Married    Spouse name: Not on file  . Number of children: Not on file  . Years of education: Not on file  . Highest education level: Not on file  Occupational History  . Not on file  Social Needs  . Financial resource strain: Not on file  . Food insecurity:    Worry: Not on file    Inability: Not on file  . Transportation needs:    Medical: Not on file    Non-medical: Not on file  Tobacco Use  . Smoking status: Never Smoker  . Smokeless tobacco: Never Used  Substance and  Sexual Activity  . Alcohol use: Yes    Alcohol/week: 0.0 oz  . Drug use: No  . Sexual activity: Not on file  Lifestyle  . Physical activity:    Days per week: Not on file    Minutes per session: Not on file  . Stress: Not on file  Relationships  . Social connections:    Talks on phone: Not on file    Gets together: Not on file    Attends religious service: Not on file    Active member of club or organization: Not on file    Attends meetings of clubs or organizations: Not on file    Relationship status: Not on file  Other Topics Concern  . Not on file  Social History Narrative  . Not on file    Outpatient Encounter Medications as of 02/28/2018  Medication Sig  . diclofenac (VOLTAREN) 75 MG EC tablet Take 75 mg by mouth 2 (two) times daily.  . metoprolol succinate (TOPROL-XL) 25 MG 24 hr tablet TAKE 1 TABLET(25 MG) BY MOUTH DAILY  . Ocrelizumab (OCREVUS IV) Inject into the vein.  . valsartan-hydrochlorothiazide (DIOVAN-HCT) 320-25 MG tablet TAKE 1  TABLET BY MOUTH DAILY  . VITAMIN D, ERGOCALCIFEROL, PO Take by mouth.   No facility-administered encounter medications on file as of 02/28/2018.     Review of Systems  Constitutional: Negative for appetite change and unexpected weight change.  HENT: Negative for congestion and sinus pressure.   Respiratory: Negative for cough, chest tightness and shortness of breath.   Cardiovascular: Negative for chest pain and palpitations.  Gastrointestinal: Negative for abdominal pain, diarrhea, nausea and vomiting.  Genitourinary: Negative for difficulty urinating and dysuria.  Musculoskeletal: Negative for myalgias.       Right knee pain and swelling as outlined.  Seeing ortho.  Skin: Negative for color change and rash.  Neurological: Negative for dizziness, light-headedness and headaches.  Psychiatric/Behavioral: Negative for agitation and dysphoric mood.       Objective:    Physical Exam  Constitutional: She appears well-developed  and well-nourished. No distress.  HENT:  Nose: Nose normal.  Mouth/Throat: Oropharynx is clear and moist.  Neck: Neck supple. No thyromegaly present.  Cardiovascular: Normal rate and regular rhythm.  Pulmonary/Chest: Breath sounds normal. No respiratory distress. She has no wheezes.  Abdominal: Soft. Bowel sounds are normal. There is no tenderness.  Musculoskeletal: She exhibits no edema or tenderness.  Lymphadenopathy:    She has no cervical adenopathy.  Skin: No rash noted. No erythema.  Psychiatric: She has a normal mood and affect. Her behavior is normal.    BP 130/78 (BP Location: Left Arm, Patient Position: Sitting, Cuff Size: Normal)   Pulse 63   Temp 98.7 F (37.1 C) (Oral)   Resp 18   Wt 231 lb (104.8 kg)   LMP 12/27/2000   SpO2 99%   BMI 36.18 kg/m  Wt Readings from Last 3 Encounters:  02/28/18 231 lb (104.8 kg)  08/30/17 228 lb 3.2 oz (103.5 kg)  04/24/17 224 lb 9.6 oz (101.9 kg)     Lab Results  Component Value Date   WBC 4.9 12/05/2017   HGB 13.3 12/05/2017   HCT 41.5 12/05/2017   PLT 328.0 12/05/2017   GLUCOSE 82 02/28/2018   CHOL 174 12/05/2017   TRIG 59.0 12/05/2017   HDL 84.50 12/05/2017   LDLCALC 78 12/05/2017   ALT 13 02/28/2018   AST 14 02/28/2018   NA 140 02/28/2018   K 3.9 02/28/2018   CL 103 02/28/2018   CREATININE 0.79 02/28/2018   BUN 10 02/28/2018   CO2 33 (H) 02/28/2018   TSH 1.58 12/05/2017    Mm Screening Breast Tomo Bilateral  Result Date: 09/28/2017 CLINICAL DATA:  Screening. EXAM: 2D DIGITAL SCREENING BILATERAL MAMMOGRAM WITH CAD AND ADJUNCT TOMO COMPARISON:  Previous exam(s). ACR Breast Density Category a: The breast tissue is almost entirely fatty. FINDINGS: There are no findings suspicious for malignancy. Images were processed with CAD. IMPRESSION: No mammographic evidence of malignancy. A result letter of this screening mammogram will be mailed directly to the patient. RECOMMENDATION: Screening mammogram in one year.  (Code:SM-B-01Y) BI-RADS CATEGORY  1: Negative. Electronically Signed   By: Frederico Hamman M.D.   On: 09/28/2017 10:40       Assessment & Plan:   Problem List Items Addressed This Visit    Anemia    Follow cbc.       Essential hypertension, benign    Blood pressure under good control.  Continue same medication regimen.  Follow pressures.  Follow metabolic panel.        Hypercholesterolemia    Low cholesterol diet and exercise.  Follow  lipid panel.        Multiple sclerosis (HCC)    Followed by neurology.  Note reviewed.  Stable.        Right knee pain    Seeing ortho.  Planning for f/u as outlined.        Vitamin D deficiency    Follow vitamin D level.         Other Visit Diagnoses    Hypokalemia    -  Primary   Relevant Orders   Basic metabolic panel (Completed)   Proteins serum plasma low       Relevant Orders   Hepatic function panel (Completed)       [place], MD

## 2018-03-01 ENCOUNTER — Encounter: Payer: Self-pay | Admitting: Internal Medicine

## 2018-03-03 ENCOUNTER — Encounter: Payer: Self-pay | Admitting: Internal Medicine

## 2018-03-03 NOTE — Assessment & Plan Note (Signed)
Seeing ortho.  Planning for f/u as outlined.

## 2018-03-03 NOTE — Assessment & Plan Note (Signed)
Low cholesterol diet and exercise.  Follow lipid panel.   

## 2018-03-03 NOTE — Assessment & Plan Note (Signed)
Followed by neurology.  Note reviewed.  Stable.

## 2018-03-03 NOTE — Assessment & Plan Note (Signed)
Follow vitamin D level.  

## 2018-03-03 NOTE — Assessment & Plan Note (Signed)
Blood pressure under good control.  Continue same medication regimen.  Follow pressures.  Follow metabolic panel.   

## 2018-03-03 NOTE — Assessment & Plan Note (Signed)
Follow cbc.  

## 2018-05-19 ENCOUNTER — Other Ambulatory Visit: Payer: Self-pay | Admitting: Internal Medicine

## 2018-06-04 ENCOUNTER — Encounter: Payer: Self-pay | Admitting: Internal Medicine

## 2018-07-18 ENCOUNTER — Ambulatory Visit: Payer: BC Managed Care – PPO | Admitting: Internal Medicine

## 2018-07-18 ENCOUNTER — Ambulatory Visit (INDEPENDENT_AMBULATORY_CARE_PROVIDER_SITE_OTHER): Payer: BC Managed Care – PPO

## 2018-07-18 VITALS — BP 126/70 | HR 67 | Temp 98.3°F | Resp 18 | Wt 232.8 lb

## 2018-07-18 DIAGNOSIS — Z1231 Encounter for screening mammogram for malignant neoplasm of breast: Secondary | ICD-10-CM | POA: Diagnosis not present

## 2018-07-18 DIAGNOSIS — M25551 Pain in right hip: Secondary | ICD-10-CM | POA: Diagnosis not present

## 2018-07-18 DIAGNOSIS — K219 Gastro-esophageal reflux disease without esophagitis: Secondary | ICD-10-CM

## 2018-07-18 DIAGNOSIS — E559 Vitamin D deficiency, unspecified: Secondary | ICD-10-CM

## 2018-07-18 DIAGNOSIS — I10 Essential (primary) hypertension: Secondary | ICD-10-CM

## 2018-07-18 DIAGNOSIS — D649 Anemia, unspecified: Secondary | ICD-10-CM | POA: Diagnosis not present

## 2018-07-18 DIAGNOSIS — G35 Multiple sclerosis: Secondary | ICD-10-CM

## 2018-07-18 DIAGNOSIS — R062 Wheezing: Secondary | ICD-10-CM

## 2018-07-18 DIAGNOSIS — Z1239 Encounter for other screening for malignant neoplasm of breast: Secondary | ICD-10-CM

## 2018-07-18 DIAGNOSIS — E7800 Pure hypercholesterolemia, unspecified: Secondary | ICD-10-CM

## 2018-07-18 MED ORDER — ESOMEPRAZOLE MAGNESIUM 40 MG PO CPDR: 40.0000 mg | DELAYED_RELEASE_CAPSULE | Freq: Every day | ORAL | 1 refills | Status: DC

## 2018-07-18 NOTE — Progress Notes (Signed)
m °

## 2018-07-18 NOTE — Progress Notes (Signed)
Patient ID: Jackie White, female   DOB: 1956-02-11, 62 y.o.   MRN: [id number]   Subjective:    Patient ID: Jackie White, female    DOB: 1956/05/29, 62 y.o.   MRN: [id number]  HPI  Patient here for a scheduled follow up.  Reports she is doing relatively well.  Went to [place] recently.  Had some congestion while there.  Congestion is better.  No sinus pressure.  No drainage.  No sore throat.  Denies any acid reflux.  Does report some persistent intermittent wheezing.  Notices more at night when lying down.  No chest congestion.  No increased cough.  No sob.  No abdominal pain.  Bowels moving.  Had had some right hip joint pain.  Persistent.  Taking advil/alleve.  Helps.  No injury.     Past Medical History:  Diagnosis Date  . Abnormal Pap smear   . Cervical disc disease    Cervical and lumbar disc disease  . GERD (gastroesophageal reflux disease)   . History of iron deficiency   . Hypertension   . Multiple sclerosis (HCC)   . Personal history of colonic polyps   . Reactive airway disease   . Vitamin D deficiency    Past Surgical History:  Procedure Laterality Date  . BREAST REDUCTION SURGERY    . REDUCTION MAMMAPLASTY Bilateral 2002  . TOTAL ABDOMINAL HYSTERECTOMY  2003   Right Oophrectomy   Family History  Problem Relation Age of Onset  . Stroke Mother   . Hypertension Mother   . Heart disease Maternal Grandmother   . Breast cancer Neg Hx    Social History   Socioeconomic History  . Marital status: Married    Spouse name: Not on file  . Number of children: Not on file  . Years of education: Not on file  . Highest education level: Not on file  Occupational History  . Not on file  Social Needs  . Financial resource strain: Not on file  . Food insecurity:    Worry: Not on file    Inability: Not on file  . Transportation needs:    Medical: Not on file    Non-medical: Not on file  Tobacco Use  . Smoking status: Never Smoker  . Smokeless tobacco:  Never Used  Substance and Sexual Activity  . Alcohol use: Yes    Alcohol/week: 0.0 standard drinks  . Drug use: No  . Sexual activity: Not on file  Lifestyle  . Physical activity:    Days per week: Not on file    Minutes per session: Not on file  . Stress: Not on file  Relationships  . Social connections:    Talks on phone: Not on file    Gets together: Not on file    Attends religious service: Not on file    Active member of club or organization: Not on file    Attends meetings of clubs or organizations: Not on file    Relationship status: Not on file  Other Topics Concern  . Not on file  Social History Narrative  . Not on file    Outpatient Encounter Medications as of 07/18/2018  Medication Sig  . diclofenac (VOLTAREN) 75 MG EC tablet Take 75 mg by mouth 2 (two) times daily.  Marland Kitchen esomeprazole (NEXIUM) 40 MG capsule Take 1 capsule (40 mg total) by mouth daily.  . metoprolol succinate (TOPROL-XL) 25 MG 24 hr tablet TAKE 1 TABLET(25 MG) BY MOUTH DAILY  .  Ocrelizumab (OCREVUS IV) Inject into the vein.  . valsartan-hydrochlorothiazide (DIOVAN-HCT) 320-25 MG tablet TAKE 1 TABLET BY MOUTH DAILY  . VITAMIN D, ERGOCALCIFEROL, PO Take by mouth.   No facility-administered encounter medications on file as of 07/18/2018.     Review of Systems  Constitutional: Negative for appetite change and unexpected weight change.  HENT: Negative for congestion and sinus pressure.   Respiratory: Positive for wheezing. Negative for cough, chest tightness and shortness of breath.   Cardiovascular: Negative for chest pain, palpitations and leg swelling.  Gastrointestinal: Negative for abdominal pain, diarrhea, nausea and vomiting.  Genitourinary: Negative for difficulty urinating and dysuria.  Musculoskeletal: Negative for myalgias.       Right hip pain as outlined.    Skin: Negative for color change and rash.  Neurological: Negative for dizziness, light-headedness and headaches.    Psychiatric/Behavioral: Negative for agitation and dysphoric mood.       Objective:     Blood pressure rechecked by me:  122/80  Physical Exam  Constitutional: She appears well-developed and well-nourished. No distress.  HENT:  Nose: Nose normal.  Mouth/Throat: Oropharynx is clear and moist.  Neck: Neck supple. No thyromegaly present.  Cardiovascular: Normal rate and regular rhythm.  Pulmonary/Chest: Breath sounds normal. No respiratory distress. She has no wheezes.  Abdominal: Soft. Bowel sounds are normal. There is no tenderness.  Musculoskeletal: She exhibits no edema or tenderness.  Lymphadenopathy:    She has no cervical adenopathy.  Skin: No rash noted. No erythema.  Psychiatric: She has a normal mood and affect. Her behavior is normal.    BP 126/70 (BP Location: Left Arm, Patient Position: Sitting, Cuff Size: Normal)   Pulse 67   Temp 98.3 F (36.8 C) (Oral)   Resp 18   Wt 232 lb 12.8 oz (105.6 kg)   LMP 12/27/2000   SpO2 99%   BMI 36.46 kg/m  Wt Readings from Last 3 Encounters:  07/18/18 232 lb 12.8 oz (105.6 kg)  02/28/18 231 lb (104.8 kg)  08/30/17 228 lb 3.2 oz (103.5 kg)     Lab Results  Component Value Date   WBC 4.9 12/05/2017   HGB 13.3 12/05/2017   HCT 41.5 12/05/2017   PLT 328.0 12/05/2017   GLUCOSE 82 02/28/2018   CHOL 174 12/05/2017   TRIG 59.0 12/05/2017   HDL 84.50 12/05/2017   LDLCALC 78 12/05/2017   ALT 13 02/28/2018   AST 14 02/28/2018   NA 140 02/28/2018   K 3.9 02/28/2018   CL 103 02/28/2018   CREATININE 0.79 02/28/2018   BUN 10 02/28/2018   CO2 33 (H) 02/28/2018   TSH 1.58 12/05/2017    Mm Screening Breast Tomo Bilateral  Result Date: 09/28/2017 CLINICAL DATA:  Screening. EXAM: 2D DIGITAL SCREENING BILATERAL MAMMOGRAM WITH CAD AND ADJUNCT TOMO COMPARISON:  Previous exam(s). ACR Breast Density Category a: The breast tissue is almost entirely fatty. FINDINGS: There are no findings suspicious for malignancy. Images were  processed with CAD. IMPRESSION: No mammographic evidence of malignancy. A result letter of this screening mammogram will be mailed directly to the patient. RECOMMENDATION: Screening mammogram in one year. (Code:SM-B-01Y) BI-RADS CATEGORY  1: Negative. Electronically Signed   By: Frederico Hamman M.D.   On: 09/28/2017 10:40       Assessment & Plan:   Problem List Items Addressed This Visit    Anemia    Follow cbc.       Essential hypertension, benign    Blood pressure under good control.  Continue same medication regimen.  Follow pressures.  Follow metabolic panel.        GERD (gastroesophageal reflux disease)    Has a history of acid reflux.  Is off nexium now.  Discussed her wheezing.  Worse at night.  No actual increased acid reflux reported.  Restart nexium.  See if symptoms improve.  Lungs clear.  Will check cxr.        Relevant Medications   esomeprazole (NEXIUM) 40 MG capsule   Hypercholesterolemia    Low cholesterol diet and exercise.  Follow lipid panel.        Multiple sclerosis (HCC)    Followed by neurology.  Stable.        Right hip pain - Primary    Persistent pain.  Check xray.  Taking advil.  May need referral to PT.        Relevant Orders   DG HIP UNILAT WITH PELVIS 2-3 VIEWS RIGHT (Completed)   Vitamin D deficiency    Follow vitamin D level.        Wheezing    Wheezing as outlined.  No increased cough or congestion.  Check cxr.  Treat acid reflux.  Follow.        Relevant Orders   DG Chest 2 View (Completed)    Other Visit Diagnoses    Breast cancer screening       Relevant Orders   MM 3D SCREEN BREAST BILATERAL       Dale [place], MD

## 2018-07-21 ENCOUNTER — Encounter: Payer: Self-pay | Admitting: Internal Medicine

## 2018-07-21 DIAGNOSIS — R062 Wheezing: Secondary | ICD-10-CM | POA: Insufficient documentation

## 2018-07-21 NOTE — Assessment & Plan Note (Signed)
Has a history of acid reflux.  Is off nexium now.  Discussed her wheezing.  Worse at night.  No actual increased acid reflux reported.  Restart nexium.  See if symptoms improve.  Lungs clear.  Will check cxr.

## 2018-07-21 NOTE — Assessment & Plan Note (Signed)
Persistent pain.  Check xray.  Taking advil.  May need referral to PT.

## 2018-07-21 NOTE — Assessment & Plan Note (Signed)
Blood pressure under good control.  Continue same medication regimen.  Follow pressures.  Follow metabolic panel.   

## 2018-07-21 NOTE — Assessment & Plan Note (Signed)
Low cholesterol diet and exercise.  Follow lipid panel.   

## 2018-07-21 NOTE — Assessment & Plan Note (Signed)
Wheezing as outlined.  No increased cough or congestion.  Check cxr.  Treat acid reflux.  Follow.

## 2018-07-21 NOTE — Assessment & Plan Note (Signed)
Followed by neurology.  Stable  

## 2018-07-21 NOTE — Assessment & Plan Note (Signed)
Follow cbc.  

## 2018-07-21 NOTE — Assessment & Plan Note (Signed)
Follow vitamin D level.  

## 2018-08-16 ENCOUNTER — Encounter: Payer: Self-pay | Admitting: Internal Medicine

## 2018-08-16 ENCOUNTER — Other Ambulatory Visit: Payer: Self-pay | Admitting: Internal Medicine

## 2018-09-24 ENCOUNTER — Ambulatory Visit: Payer: BC Managed Care – PPO | Admitting: Internal Medicine

## 2018-09-24 ENCOUNTER — Encounter: Payer: Self-pay | Admitting: Internal Medicine

## 2018-09-24 VITALS — BP 118/76 | HR 60 | Temp 98.1°F | Resp 18 | Wt 233.0 lb

## 2018-09-24 DIAGNOSIS — E559 Vitamin D deficiency, unspecified: Secondary | ICD-10-CM

## 2018-09-24 DIAGNOSIS — E7800 Pure hypercholesterolemia, unspecified: Secondary | ICD-10-CM

## 2018-09-24 DIAGNOSIS — G35 Multiple sclerosis: Secondary | ICD-10-CM

## 2018-09-24 DIAGNOSIS — I10 Essential (primary) hypertension: Secondary | ICD-10-CM

## 2018-09-24 DIAGNOSIS — K219 Gastro-esophageal reflux disease without esophagitis: Secondary | ICD-10-CM | POA: Diagnosis not present

## 2018-09-24 DIAGNOSIS — D649 Anemia, unspecified: Secondary | ICD-10-CM

## 2018-09-24 DIAGNOSIS — R0981 Nasal congestion: Secondary | ICD-10-CM | POA: Diagnosis not present

## 2018-09-24 DIAGNOSIS — M25562 Pain in left knee: Secondary | ICD-10-CM

## 2018-09-24 DIAGNOSIS — H9390 Unspecified disorder of ear, unspecified ear: Secondary | ICD-10-CM

## 2018-09-24 NOTE — Progress Notes (Signed)
Patient ID: Jackie White, female   DOB: 31-Mar-1956, 62 y.o.   MRN: [id number]   Subjective:    Patient ID: Jackie White, female    DOB: 1956/07/03, 63 y.o.   MRN: [id number]  HPI  Patient here for a scheduled follow up.  States she is doing relatively well.  Seeing neurology for her MS.  Just evaluated 08/07/18.  Stable on ocrelizumab infusions.  Recommended f/u in 6 months.  Trying to stay active.  Knee limits her.  Saw ortho.  Being referred to Dr Kathryne Hitch.  Has had PT.  Doing her exercises. Has appt with Dr Nicki Reaper 11/14/18.  Some post nasal drainage.  No sinus pressure.  No chest congestion.  No sob or chest pain.  No abdominal pain.  Bowels moving.  Has question of keloid - left ear.  No pain.     Past Medical History:  Diagnosis Date  . Abnormal Pap smear   . Cervical disc disease    Cervical and lumbar disc disease  . GERD (gastroesophageal reflux disease)   . History of iron deficiency   . Hypertension   . Multiple sclerosis (HCC)   . Personal history of colonic polyps   . Reactive airway disease   . Vitamin D deficiency    Past Surgical History:  Procedure Laterality Date  . BREAST REDUCTION SURGERY    . REDUCTION MAMMAPLASTY Bilateral 2002  . TOTAL ABDOMINAL HYSTERECTOMY  2003   Right Oophrectomy   Family History  Problem Relation Age of Onset  . Stroke Mother   . Hypertension Mother   . Heart disease Maternal Grandmother   . Breast cancer Neg Hx    Social History   Socioeconomic History  . Marital status: Married    Spouse name: Not on file  . Number of children: Not on file  . Years of education: Not on file  . Highest education level: Not on file  Occupational History  . Not on file  Social Needs  . Financial resource strain: Not on file  . Food insecurity:    Worry: Not on file    Inability: Not on file  . Transportation needs:    Medical: Not on file    Non-medical: Not on file  Tobacco Use  . Smoking status: Never Smoker  . Smokeless tobacco:  Never Used  Substance and Sexual Activity  . Alcohol use: Yes    Alcohol/week: 0.0 standard drinks  . Drug use: No  . Sexual activity: Not on file  Lifestyle  . Physical activity:    Days per week: Not on file    Minutes per session: Not on file  . Stress: Not on file  Relationships  . Social connections:    Talks on phone: Not on file    Gets together: Not on file    Attends religious service: Not on file    Active member of club or organization: Not on file    Attends meetings of clubs or organizations: Not on file    Relationship status: Not on file  Other Topics Concern  . Not on file  Social History Narrative  . Not on file    Outpatient Encounter Medications as of 09/24/2018  Medication Sig  . esomeprazole (NEXIUM) 40 MG capsule Take 1 capsule (40 mg total) by mouth daily.  . metoprolol succinate (TOPROL-XL) 25 MG 24 hr tablet TAKE 1 TABLET(25 MG) BY MOUTH DAILY  . Ocrelizumab (OCREVUS IV) Inject into the vein.  . valsartan-hydrochlorothiazide (  DIOVAN-HCT) 320-25 MG tablet TAKE 1 TABLET BY MOUTH DAILY  . VITAMIN D, ERGOCALCIFEROL, PO Take by mouth.  . [DISCONTINUED] diclofenac (VOLTAREN) 75 MG EC tablet Take 75 mg by mouth 2 (two) times daily.   No facility-administered encounter medications on file as of 09/24/2018.     Review of Systems  Constitutional: Negative for appetite change and unexpected weight change.  HENT: Positive for congestion and postnasal drip. Negative for sinus pressure.   Respiratory: Negative for cough, chest tightness and shortness of breath.   Cardiovascular: Negative for chest pain, palpitations and leg swelling.  Gastrointestinal: Negative for abdominal pain, diarrhea, nausea and vomiting.  Genitourinary: Negative for difficulty urinating and dysuria.  Musculoskeletal: Negative for myalgias.       Persistent knee pain as outlined.    Skin: Negative for color change and rash.  Neurological: Negative for dizziness, light-headedness and  headaches.  Psychiatric/Behavioral: Negative for agitation and dysphoric mood.       Objective:    Physical Exam  Constitutional: She appears well-developed and well-nourished. No distress.  HENT:  Nose: Nose normal.  Mouth/Throat: Oropharynx is clear and moist.  Neck: Neck supple. No thyromegaly present.  Cardiovascular: Normal rate and regular rhythm.  Pulmonary/Chest: Breath sounds normal. No respiratory distress. She has no wheezes.  Abdominal: Soft. Bowel sounds are normal. There is no tenderness.  Musculoskeletal: She exhibits no edema or tenderness.  Lymphadenopathy:    She has no cervical adenopathy.  Skin: No rash noted. No erythema.  Psychiatric: She has a normal mood and affect. Her behavior is normal.    BP 118/76 (BP Location: Left Arm, Patient Position: Sitting, Cuff Size: Normal)   Pulse 60   Temp 98.1 F (36.7 C) (Oral)   Resp 18   Wt 233 lb (105.7 kg)   LMP 12/27/2000   SpO2 98%   BMI 36.49 kg/m  Wt Readings from Last 3 Encounters:  09/24/18 233 lb (105.7 kg)  07/18/18 232 lb 12.8 oz (105.6 kg)  02/28/18 231 lb (104.8 kg)     Lab Results  Component Value Date   WBC 4.9 12/05/2017   HGB 13.3 12/05/2017   HCT 41.5 12/05/2017   PLT 328.0 12/05/2017   GLUCOSE 82 02/28/2018   CHOL 174 12/05/2017   TRIG 59.0 12/05/2017   HDL 84.50 12/05/2017   LDLCALC 78 12/05/2017   ALT 13 02/28/2018   AST 14 02/28/2018   NA 140 02/28/2018   K 3.9 02/28/2018   CL 103 02/28/2018   CREATININE 0.79 02/28/2018   BUN 10 02/28/2018   CO2 33 (H) 02/28/2018   TSH 1.58 12/05/2017    Mm Screening Breast Tomo Bilateral  Result Date: 09/28/2017 CLINICAL DATA:  Screening. EXAM: 2D DIGITAL SCREENING BILATERAL MAMMOGRAM WITH CAD AND ADJUNCT TOMO COMPARISON:  Previous exam(s). ACR Breast Density Category a: The breast tissue is almost entirely fatty. FINDINGS: There are no findings suspicious for malignancy. Images were processed with CAD. IMPRESSION: No mammographic  evidence of malignancy. A result letter of this screening mammogram will be mailed directly to the patient. RECOMMENDATION: Screening mammogram in one year. (Code:SM-B-01Y) BI-RADS CATEGORY  1: Negative. Electronically Signed   By: Frederico Hamman M.D.   On: 09/28/2017 10:40       Assessment & Plan:   Problem List Items Addressed This Visit    Anemia    Follow cbc.       Essential hypertension, benign    Blood pressure under good control.  Continue same medication regimen.  Follow pressures.  Follow metabolic panel.        GERD (gastroesophageal reflux disease)    Controlled on current regimen.  Follow.        Hypercholesterolemia    Low cholesterol diet and exercise.  Follow lipid panel.        Left knee pain    Persistent.  Has seen ortho.  Planning to see Dr Kathryne Hitch.        Multiple sclerosis (HCC)    Followed by neurology.  Stable.  Continuing on current treatment regimen.        Vitamin D deficiency    Follow vitamin D level.         Other Visit Diagnoses    Nasal congestion    -  Primary   Nasal congestion as outlined.  Nasacort nasal spray, saline nasal spray and robitussin as directed.  Follow.     Ear lesion       Left.  Appears to be c/w keloid.  No pain.  Not enlarging.  Discussed dermatology referral.  Wants to monitor.  Follwo.        Dale White [place], MD

## 2018-09-24 NOTE — Patient Instructions (Signed)
nasacort nasal spray - 2 sprays each nostril one time per day.  Do this in the evening.    Robitussin - twice a day as needed for congestion.    Let me know if symptoms do not resolve.

## 2018-09-29 ENCOUNTER — Encounter: Payer: Self-pay | Admitting: Internal Medicine

## 2018-09-29 NOTE — Assessment & Plan Note (Signed)
Low cholesterol diet and exercise.  Follow lipid panel.   

## 2018-09-29 NOTE — Assessment & Plan Note (Signed)
Followed by neurology.  Stable.  Continuing on current treatment regimen.

## 2018-09-29 NOTE — Assessment & Plan Note (Signed)
Persistent.  Has seen ortho.  Planning to see Dr Kathryne Hitch.

## 2018-09-29 NOTE — Assessment & Plan Note (Signed)
Blood pressure under good control.  Continue same medication regimen.  Follow pressures.  Follow metabolic panel.   

## 2018-09-29 NOTE — Assessment & Plan Note (Signed)
Controlled on current regimen.  Follow.  

## 2018-09-29 NOTE — Assessment & Plan Note (Signed)
Follow cbc.  

## 2018-09-29 NOTE — Assessment & Plan Note (Signed)
Follow vitamin D level.  

## 2018-10-02 ENCOUNTER — Ambulatory Visit
Admission: RE | Admit: 2018-10-02 | Discharge: 2018-10-02 | Disposition: A | Discharge status: Home / Self Care | Payer: BC Managed Care – PPO | Source: Ambulatory Visit | Attending: Internal Medicine | Admitting: Internal Medicine

## 2018-10-02 DIAGNOSIS — Z1239 Encounter for other screening for malignant neoplasm of breast: Secondary | ICD-10-CM | POA: Insufficient documentation

## 2018-11-06 ENCOUNTER — Other Ambulatory Visit: Payer: Self-pay | Admitting: Internal Medicine

## 2018-11-08 IMAGING — DX DG CHEST 2V
2 series · 2 of 2 positions shown · non-contrast
Comparison: 03/01/2016

CLINICAL DATA: Wheezing for 2-3 weeks

EXAM:
CHEST - 2 VIEW

[chest pa]
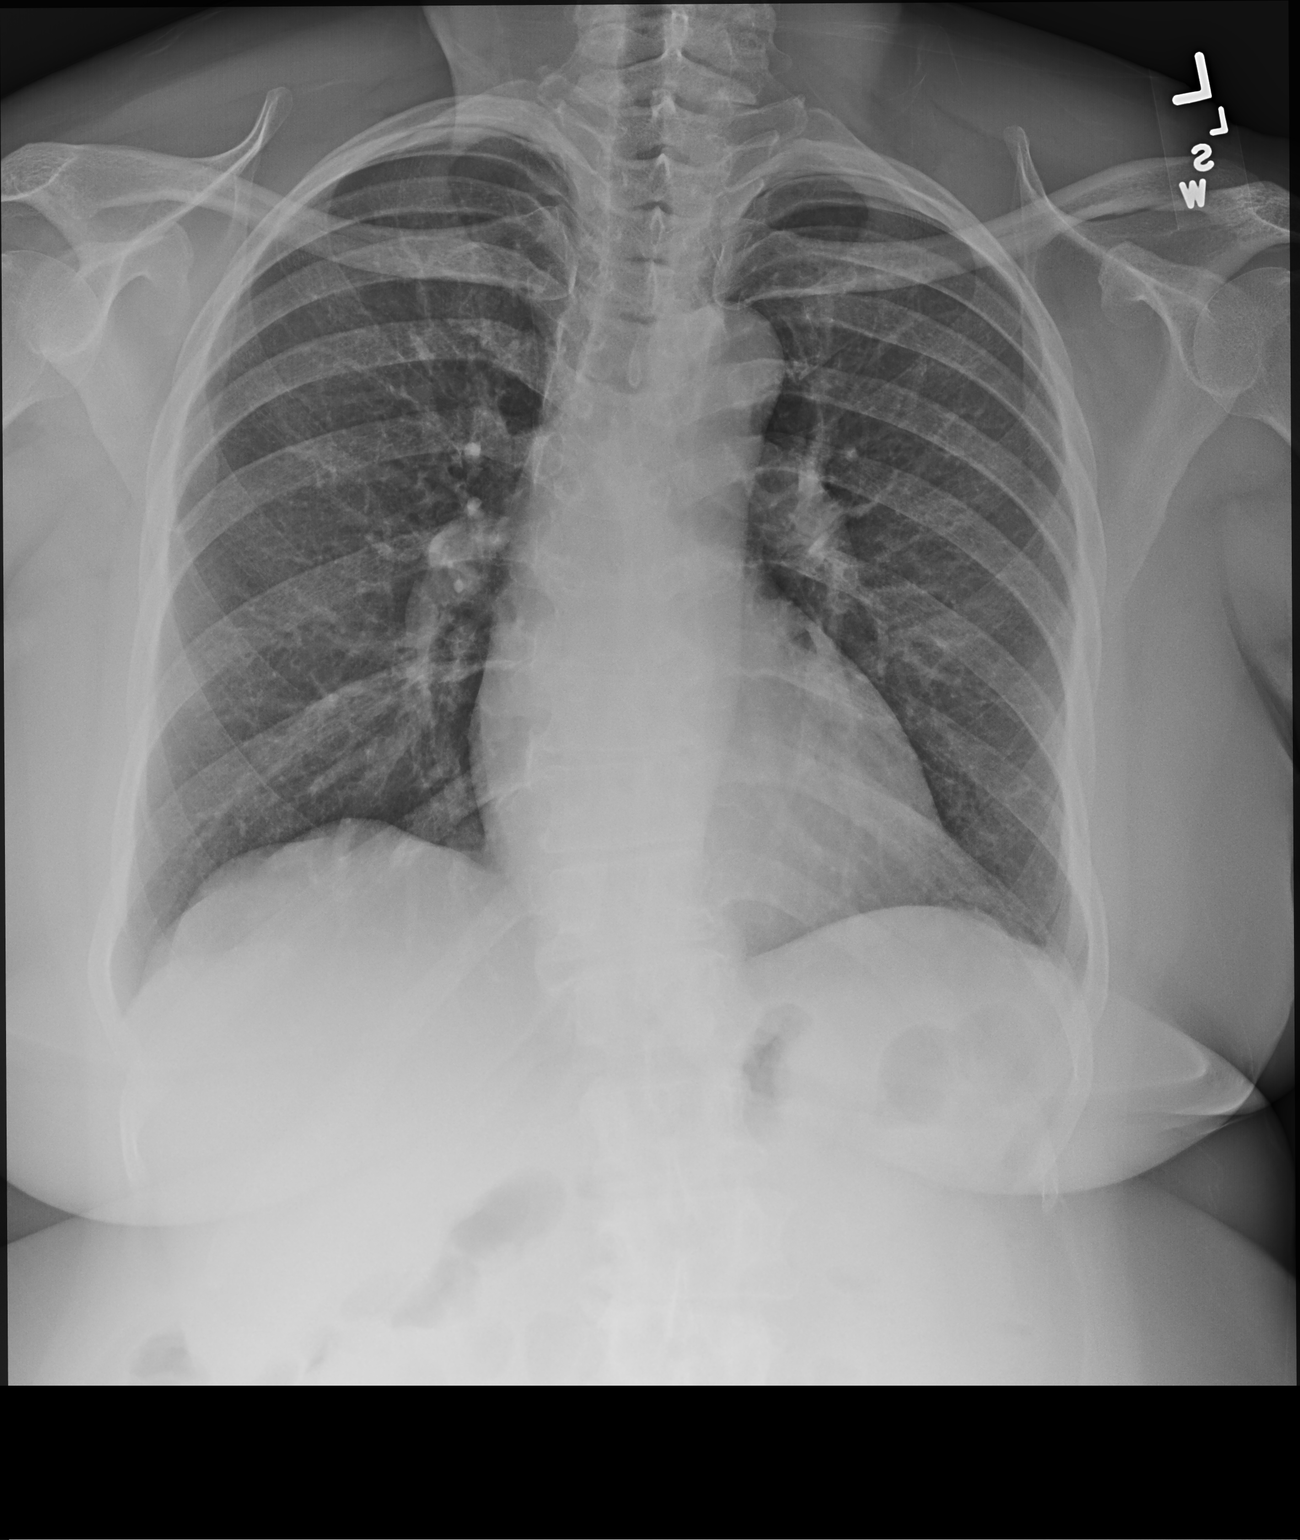

[chest lat]
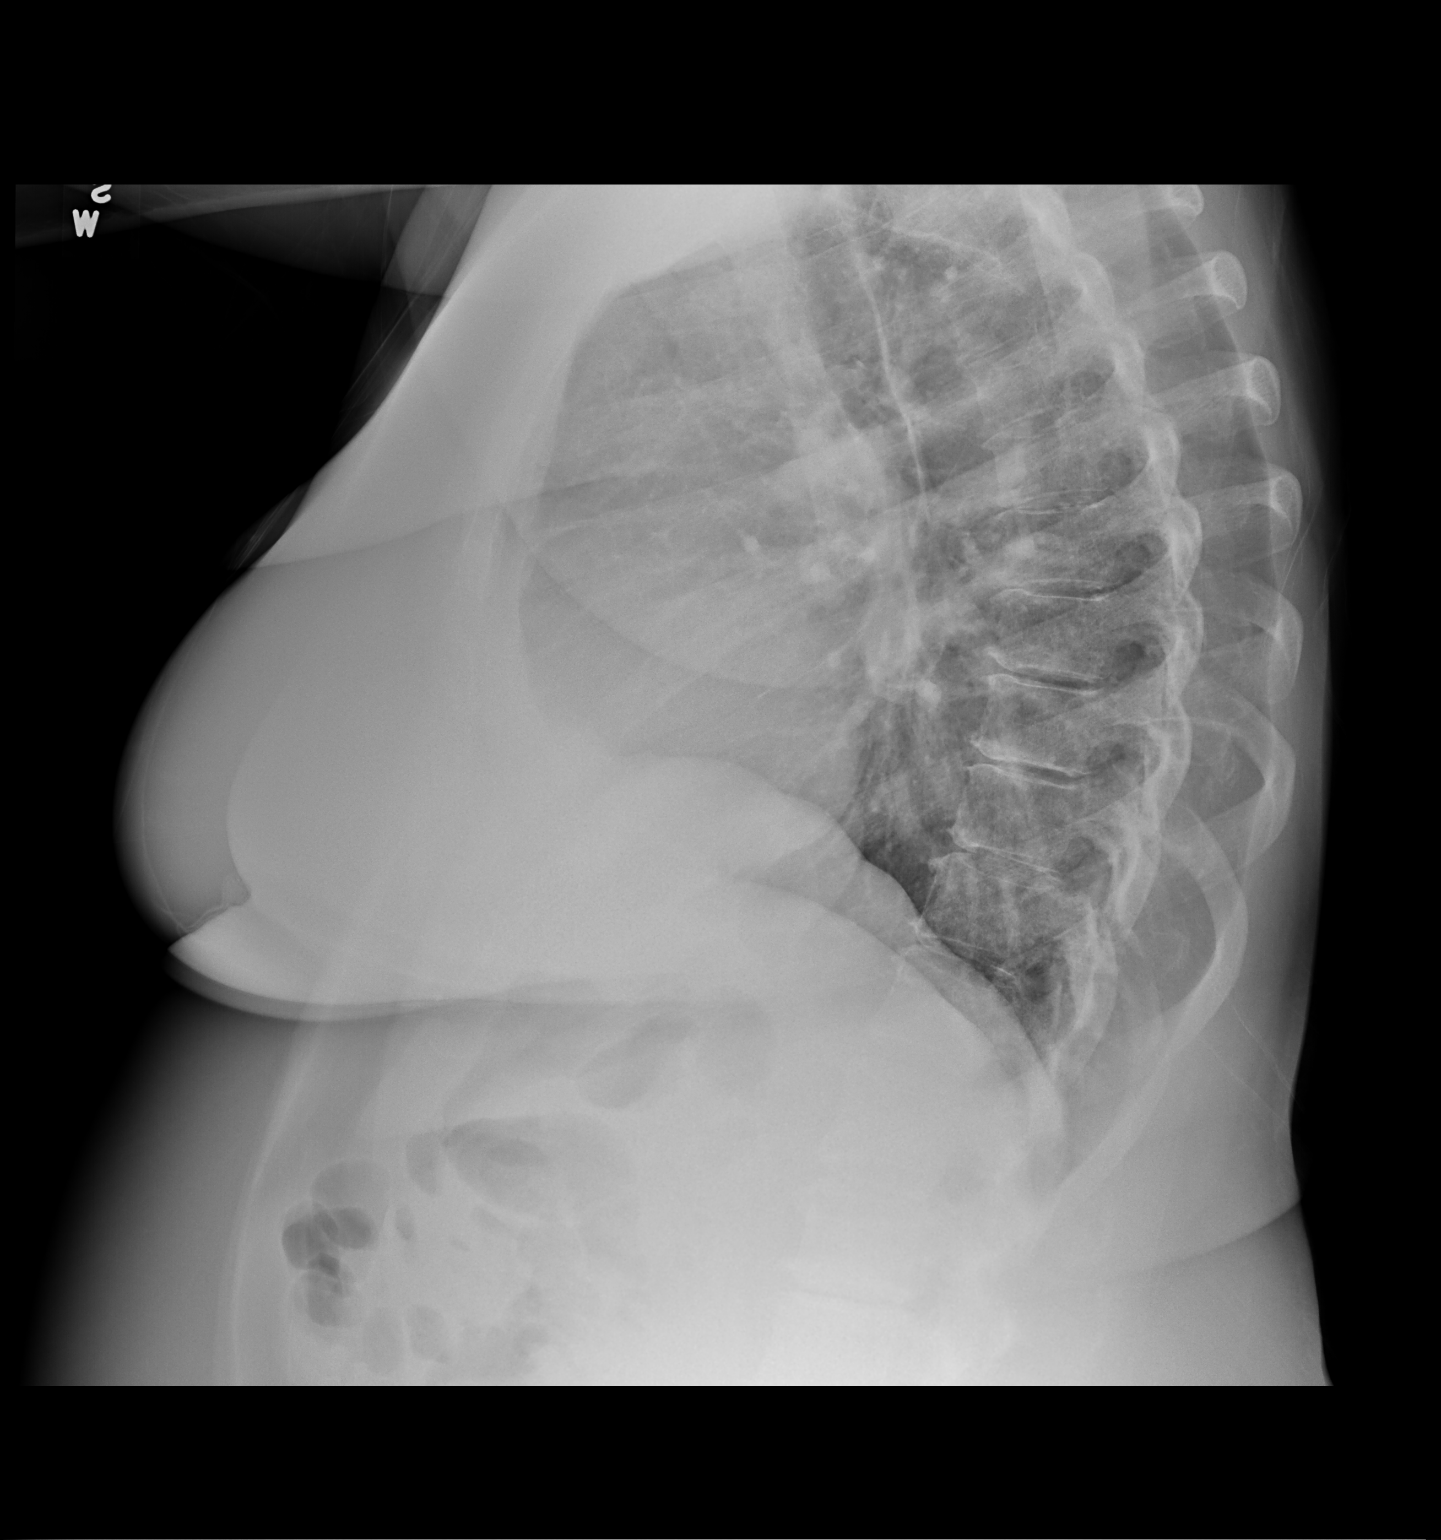

[2 of 2 positions shown; findings below may reference images not displayed]

FINDINGS: The heart size and mediastinal contours are within normal limits.
Both lungs are clear. The visualized skeletal structures are
unremarkable.
IMPRESSION: No active cardiopulmonary disease.

## 2018-11-12 ENCOUNTER — Other Ambulatory Visit: Payer: Self-pay | Admitting: Internal Medicine

## 2018-11-21 ENCOUNTER — Encounter: Payer: Self-pay | Admitting: Internal Medicine

## 2018-11-21 NOTE — Telephone Encounter (Signed)
Called patient. Unable to reach 

## 2018-11-22 ENCOUNTER — Encounter: Payer: Self-pay | Admitting: Internal Medicine

## 2018-11-22 NOTE — Telephone Encounter (Signed)
If persistent symptoms, needs to be evaluated.  

## 2018-11-22 NOTE — Telephone Encounter (Signed)
LMTCB

## 2018-11-22 NOTE — Telephone Encounter (Signed)
Spoke with pt to confirm no other issues. She has tried the mucinex and the nasacort but is not helping. She is feeling ok but wondering if there is something else she can try

## 2018-11-22 NOTE — Telephone Encounter (Signed)
See last unrouted message.  

## 2018-11-29 ENCOUNTER — Encounter: Payer: Self-pay | Admitting: Family Medicine

## 2018-11-29 ENCOUNTER — Ambulatory Visit (INDEPENDENT_AMBULATORY_CARE_PROVIDER_SITE_OTHER): Payer: BC Managed Care – PPO

## 2018-11-29 ENCOUNTER — Ambulatory Visit: Payer: BC Managed Care – PPO | Admitting: Family Medicine

## 2018-11-29 VITALS — BP 122/84 | HR 70 | Temp 98.0°F | Resp 16 | Ht 67.0 in | Wt 232.6 lb

## 2018-11-29 DIAGNOSIS — R05 Cough: Secondary | ICD-10-CM

## 2018-11-29 DIAGNOSIS — R0989 Other specified symptoms and signs involving the circulatory and respiratory systems: Secondary | ICD-10-CM | POA: Diagnosis not present

## 2018-11-29 DIAGNOSIS — R062 Wheezing: Secondary | ICD-10-CM

## 2018-11-29 DIAGNOSIS — R053 Chronic cough: Secondary | ICD-10-CM

## 2018-11-29 DIAGNOSIS — R0981 Nasal congestion: Secondary | ICD-10-CM

## 2018-11-29 MED ORDER — PREDNISONE 10 MG (21) PO TBPK: ORAL_TABLET | ORAL | 0 refills | Status: DC

## 2018-11-29 MED ORDER — ALBUTEROL SULFATE HFA 108 (90 BASE) MCG/ACT IN AERS: 2.0000 | INHALATION_SPRAY | Freq: Four times a day (QID) | RESPIRATORY_TRACT | 0 refills | Status: DC | PRN

## 2018-11-29 NOTE — Progress Notes (Signed)
Subjective:    Patient ID: Jackie White, female    DOB: Aug 05, 1956, 63 y.o.   MRN: [id number]  HPI   Patient presents to clinic complaining of cough, nasal and chest congestion and wheezing off and on since August 2019.  Patient states that she will have periods of time where the symptoms seem improved after taking some Robitussin or Mucinex using a nasal spray.  However, the symptoms always seem to slowly return.  Patient denies any history of asthma in her adult life.  States she was told she had asthma as a young child, but states she "grew out of it".  Patient has never been a smoker.  It was thought that possibly this cough could have been related to acid reflux, so she has been taking Nexium every day however the annoying cough and congestion continue to persist.  Patient Active Problem List   Diagnosis Date Noted  . Wheezing 07/21/2018  . Left shoulder pain 04/24/2017  . Right knee pain 04/24/2017  . Hemorrhoids 08/02/2016  . Low back pain 05/17/2016  . Right hip pain 05/17/2016  . Chest tightness 03/01/2016  . Rash 01/31/2015  . Health care maintenance 01/31/2015  . Left knee pain 10/12/2013  . GERD (gastroesophageal reflux disease) 12/29/2012  . Abnormal Pap smear of cervix 12/29/2012  . Essential hypertension, benign 12/25/2012  . Anemia 12/25/2012  . Degeneration of lumbar or lumbosacral intervertebral disc 12/25/2012  . Hypercholesterolemia 12/25/2012  . Vitamin D deficiency 12/25/2012  . Multiple sclerosis (HCC) 12/25/2012   Social History   Tobacco Use  . Smoking status: Never Smoker  . Smokeless tobacco: Never Used  Substance Use Topics  . Alcohol use: Yes    Alcohol/week: 0.0 standard drinks   Review of Systems  Constitutional: Negative for chills, fatigue and fever.  HENT: Negative for congestion, ear pain, sinus pain and sore throat.   Eyes: Negative.   Respiratory: +cough, shortness of breath and wheezing.   Cardiovascular: Negative for chest pain,  palpitations and leg swelling.  Gastrointestinal: Negative for abdominal pain, diarrhea, nausea and vomiting.  Genitourinary: Negative for dysuria, frequency and urgency.  Musculoskeletal: Negative for arthralgias and myalgias.  Skin: Negative for color change, pallor and rash.  Neurological: Negative for syncope, light-headedness and headaches.  Psychiatric/Behavioral: The patient is not nervous/anxious.       Objective:   Physical Exam Vitals signs and nursing note reviewed.  Constitutional:      General: She is not in acute distress.    Appearance: Normal appearance. She is not ill-appearing, toxic-appearing or diaphoretic.  HENT:     Head: Normocephalic and atraumatic.     Right Ear: Tympanic membrane, ear canal and external ear normal.     Left Ear: Tympanic membrane, ear canal and external ear normal.     Nose: Rhinorrhea present.     Comments: Clear nasal drainage with post nasal drip Eyes:     General: No scleral icterus.    Extraocular Movements: Extraocular movements intact.     Conjunctiva/sclera: Conjunctivae normal.  Cardiovascular:     Rate and Rhythm: Normal rate and regular rhythm.  Pulmonary:     Effort: Pulmonary effort is normal. No respiratory distress.     Breath sounds: Wheezing (faint expiratory wheezes upper lobes) present.     Comments: +raspy cough Skin:    General: Skin is warm and dry.     Coloration: Skin is not pale.  Neurological:     Mental Status: She is alert  and oriented to person, place, and time.  Psychiatric:        Mood and Affect: Mood normal.        Behavior: Behavior normal.    Vitals:   11/29/18 0957  BP: 122/84  Pulse: 70  Resp: 16  Temp: 98 F (36.7 C)  SpO2: 98%      Assessment & Plan:   Chronic coughing, chest congestion, wheezing, nasal congestion - due to chronic cough and chest congestion with wheezing happening off and on since August I feel patient seeing pulmonology would be a good step to take.  We will get  repeat chest x-ray in clinic today to compare to chest x-ray done in August 2019.  Patient will use albuterol inhaler as needed for wheezing.  Also advised to begin taking a daily allergy medicine such as Claritin once per day to see if this helps improve symptoms.  She will also take prednisone taper down to see if this helps improve cough/wheezing symptoms.  Advised she can also continue use the nasal spray if needed for nasal congestion.  Patient aware she will be contacted in regards to her x-ray results and also she will be contacted about pulmonology appointment.

## 2018-12-24 ENCOUNTER — Encounter: Payer: Self-pay | Admitting: Emergency Medicine

## 2018-12-24 ENCOUNTER — Ambulatory Visit
Admission: EM | Admit: 2018-12-24 | Discharge: 2018-12-24 | Disposition: A | Discharge status: Home / Self Care | Payer: BC Managed Care – PPO | Attending: Family Medicine | Admitting: Family Medicine

## 2018-12-24 ENCOUNTER — Other Ambulatory Visit: Payer: Self-pay

## 2018-12-24 DIAGNOSIS — R509 Fever, unspecified: Secondary | ICD-10-CM | POA: Diagnosis not present

## 2018-12-24 DIAGNOSIS — R05 Cough: Secondary | ICD-10-CM

## 2018-12-24 DIAGNOSIS — R6889 Other general symptoms and signs: Secondary | ICD-10-CM

## 2018-12-24 MED ORDER — BENZONATATE 200 MG PO CAPS: ORAL_CAPSULE | ORAL | 0 refills | Status: DC

## 2018-12-24 MED ORDER — HYDROCOD POLST-CPM POLST ER 10-8 MG/5ML PO SUER: 5.0000 mL | Freq: Two times a day (BID) | ORAL | 0 refills | Status: DC

## 2018-12-24 MED ORDER — OSELTAMIVIR PHOSPHATE 75 MG PO CAPS: 75.0000 mg | ORAL_CAPSULE | Freq: Two times a day (BID) | ORAL | 0 refills | Status: DC

## 2018-12-24 MED ORDER — ACETAMINOPHEN 500 MG PO TABS
1000.0000 mg | ORAL_TABLET | Freq: Once | ORAL | Status: AC
  Administered 2018-12-24: 1000 mg via ORAL

## 2018-12-24 NOTE — Discharge Instructions (Addendum)
Drink plenty of fluids.  Rest as much as possible.  Use Tylenol or Motrin for fever chills or body aches.  °

## 2018-12-24 NOTE — ED Provider Notes (Signed)
[facility]    CSN: [id number] Arrival date & time: 12/24/18  1813     History   Chief Complaint Chief Complaint  Patient presents with  . Generalized Body Aches  . Cough  . Fever    HPI Jackie White is a 63 y.o. female.   HPI  63 year old female presents with cough body aches and fever that started suddenly this morning.  She was sent home from work.  She flu shot in October.  She works at [employer] as a Sports coach.  Temperature 101.1 pulse rate of 78 blood pressure 118/72 respiration 16 O2 sats 100%.    Past Medical History:  Diagnosis Date  . Abnormal Pap smear   . Cervical disc disease    Cervical and lumbar disc disease  . GERD (gastroesophageal reflux disease)   . History of iron deficiency   . Hypertension   . Multiple sclerosis (HCC)   . Personal history of colonic polyps   . Reactive airway disease   . Vitamin D deficiency     Patient Active Problem List   Diagnosis Date Noted  . Wheezing 07/21/2018  . Left shoulder pain 04/24/2017  . Right knee pain 04/24/2017  . Hemorrhoids 08/02/2016  . Low back pain 05/17/2016  . Right hip pain 05/17/2016  . Chest tightness 03/01/2016  . Rash 01/31/2015  . Health care maintenance 01/31/2015  . Left knee pain 10/12/2013  . GERD (gastroesophageal reflux disease) 12/29/2012  . Abnormal Pap smear of cervix 12/29/2012  . Essential hypertension, benign 12/25/2012  . Anemia 12/25/2012  . Degeneration of lumbar or lumbosacral intervertebral disc 12/25/2012  . Hypercholesterolemia 12/25/2012  . Vitamin D deficiency 12/25/2012  . Multiple sclerosis (HCC) 12/25/2012    Past Surgical History:  Procedure Laterality Date  . BREAST REDUCTION SURGERY    . REDUCTION MAMMAPLASTY Bilateral 2002  . TOTAL ABDOMINAL HYSTERECTOMY  2003   Right Oophrectomy    OB History   No obstetric history on file.      Home Medications    Prior to Admission medications   Medication Sig Start Date End Date Taking?  Authorizing Provider  esomeprazole (NEXIUM) 40 MG capsule TAKE 1 CAPSULE(40 MG) BY MOUTH DAILY 11/07/18  Yes Dale [place], MD  metoprolol succinate (TOPROL-XL) 25 MG 24 hr tablet TAKE 1 TABLET(25 MG) BY MOUTH DAILY 11/12/18  Yes Dale [place], MD  Ocrelizumab (OCREVUS IV) Inject into the vein.   Yes [provider]  valsartan-hydrochlorothiazide (DIOVAN-HCT) 320-25 MG tablet TAKE 1 TABLET BY MOUTH DAILY 11/12/18  Yes Dale [place], MD  VITAMIN D, ERGOCALCIFEROL, PO Take by mouth.   Yes [provider]  albuterol (PROVENTIL HFA;VENTOLIN HFA) 108 (90 Base) MCG/ACT inhaler Inhale 2 puffs into the lungs every 6 (six) hours as needed for wheezing or shortness of breath. 11/29/18   Tracey Harries, FNP  benzonatate (TESSALON) 200 MG capsule Take one cap TID PRN cough 12/24/18   Lutricia Feil, PA-C  chlorpheniramine-HYDROcodone [facility] ER) 10-8 MG/5ML SUER Take 5 mLs by mouth 2 (two) times daily. 12/24/18   Lutricia Feil, PA-C  oseltamivir (TAMIFLU) 75 MG capsule Take 1 capsule (75 mg total) by mouth every 12 (twelve) hours. 12/24/18   Lutricia Feil, PA-C    Family History Family History  Problem Relation Age of Onset  . Stroke Mother   . Hypertension Mother   . Heart disease Maternal Grandmother   . Breast cancer Neg Hx     Social History  Social History   Tobacco Use  . Smoking status: Never Smoker  . Smokeless tobacco: Never Used  Substance Use Topics  . Alcohol use: Yes    Alcohol/week: 0.0 standard drinks  . Drug use: No     Allergies   Patient has no known allergies.   Review of Systems Review of Systems  Constitutional: Positive for activity change, chills, fatigue and fever.  All other systems reviewed and are negative.    Physical Exam Triage Vital Signs ED Triage Vitals  Enc Vitals Group     BP 12/24/18 1823 118/72     Pulse Rate 12/24/18 1823 78     Resp 12/24/18 1823 16     Temp 12/24/18 1823 (!) 101.1 F (38.4 C)      Temp Source 12/24/18 1823 Oral     SpO2 12/24/18 1823 100 %     Weight 12/24/18 1820 216 lb (98 kg)     Height 12/24/18 1820 5\' 7"  (1.702 m)     Head Circumference --      Peak Flow --      Pain Score 12/24/18 1820 2     Pain Loc --      Pain Edu? --      Excl. in GC? --    No data found.  Updated Vital Signs BP 118/72 (BP Location: Left Arm)   Pulse 78   Temp (!) 101.1 F (38.4 C) (Oral)   Resp 16   Ht 5\' 7"  (1.702 m)   Wt 216 lb (98 kg)   LMP 12/27/2000   SpO2 100%   BMI 33.83 kg/m   Visual Acuity Right Eye Distance:   Left Eye Distance:   Bilateral Distance:    Right Eye Near:   Left Eye Near:    Bilateral Near:     Physical Exam Vitals signs and nursing note reviewed.  Constitutional:      General: She is not in acute distress.    Appearance: Normal appearance. She is not ill-appearing, toxic-appearing or diaphoretic.  HENT:     Head: Normocephalic.     Right Ear: Tympanic membrane, ear canal and external ear normal.     Left Ear: Tympanic membrane, ear canal and external ear normal.     Nose: Nose normal.     Mouth/Throat:     Mouth: Mucous membranes are moist.     Pharynx: No oropharyngeal exudate or posterior oropharyngeal erythema.  Eyes:     General:        Right eye: No discharge.        Left eye: No discharge.     Conjunctiva/sclera: Conjunctivae normal.  Neck:     Musculoskeletal: Normal range of motion and neck supple.  Pulmonary:     Effort: Pulmonary effort is normal.     Breath sounds: Normal breath sounds.  Musculoskeletal: Normal range of motion.  Skin:    General: Skin is warm and dry.  Neurological:     General: No focal deficit present.     Mental Status: She is alert and oriented to person, place, and time.  Psychiatric:        Mood and Affect: Mood normal.        Behavior: Behavior normal.        Thought Content: Thought content normal.        Judgment: Judgment normal.      UC Treatments / Results  Labs (all labs  ordered are listed, but only abnormal results  are displayed) Labs Reviewed - No data to display  EKG None  Radiology No results found.  Procedures Procedures (including critical care time)  Medications Ordered in UC Medications  acetaminophen (TYLENOL) tablet 1,000 mg (1,000 mg Oral Given 12/24/18 1826)    Initial Impression / Assessment and Plan / UC Course  I have reviewed the triage vital signs and the nursing notes.  Pertinent labs & imaging results that were available during my care of the patient were reviewed by me and considered in my medical decision making (see chart for details).   Has flulike symptoms.  We will treat her with Tamiflu cough suppressants.  Motrin or Tylenol for body aches fever or chills.   Final Clinical Impressions(s) / UC Diagnoses   Final diagnoses:  Flu-like symptoms     Discharge Instructions     Drink plenty of fluids.  Rest as much as possible.  Use Tylenol or Motrin for fever chills or body aches.    ED Prescriptions    Medication Sig Dispense Auth. Provider   oseltamivir (TAMIFLU) 75 MG capsule Take 1 capsule (75 mg total) by mouth every 12 (twelve) hours. 10 capsule Lutricia Feil, PA-C   chlorpheniramine-HYDROcodone (TUSSIONEX PENNKINETIC ER) 10-8 MG/5ML SUER Take 5 mLs by mouth 2 (two) times daily. 115 mL Ovid Curd P, PA-C   benzonatate (TESSALON) 200 MG capsule Take one cap TID PRN cough 30 capsule Lutricia Feil, PA-C     Controlled Substance Prescriptions [place] Controlled Substance Registry consulted? Not Applicable   Lutricia Feil, PA-C 12/24/18 2102

## 2018-12-24 NOTE — ED Triage Notes (Signed)
Patient c/o cough, bodyaches, and fever that started this morning.

## 2018-12-31 ENCOUNTER — Encounter: Payer: Self-pay | Admitting: Internal Medicine

## 2018-12-31 ENCOUNTER — Ambulatory Visit (INDEPENDENT_AMBULATORY_CARE_PROVIDER_SITE_OTHER): Payer: BC Managed Care – PPO | Admitting: Internal Medicine

## 2018-12-31 VITALS — BP 130/78 | HR 61 | Temp 99.1°F | Ht 67.0 in | Wt 226.8 lb

## 2018-12-31 DIAGNOSIS — G35D Multiple sclerosis, unspecified: Secondary | ICD-10-CM

## 2018-12-31 DIAGNOSIS — G35 Multiple sclerosis: Secondary | ICD-10-CM

## 2018-12-31 DIAGNOSIS — R0602 Shortness of breath: Secondary | ICD-10-CM

## 2018-12-31 DIAGNOSIS — I10 Essential (primary) hypertension: Secondary | ICD-10-CM

## 2018-12-31 DIAGNOSIS — R05 Cough: Secondary | ICD-10-CM

## 2018-12-31 DIAGNOSIS — D649 Anemia, unspecified: Secondary | ICD-10-CM

## 2018-12-31 DIAGNOSIS — E559 Vitamin D deficiency, unspecified: Secondary | ICD-10-CM | POA: Diagnosis not present

## 2018-12-31 DIAGNOSIS — K219 Gastro-esophageal reflux disease without esophagitis: Secondary | ICD-10-CM

## 2018-12-31 DIAGNOSIS — Z0000 Encounter for general adult medical examination without abnormal findings: Secondary | ICD-10-CM | POA: Diagnosis not present

## 2018-12-31 DIAGNOSIS — R059 Cough, unspecified: Secondary | ICD-10-CM

## 2018-12-31 DIAGNOSIS — E7800 Pure hypercholesterolemia, unspecified: Secondary | ICD-10-CM

## 2018-12-31 DIAGNOSIS — R87619 Unspecified abnormal cytological findings in specimens from cervix uteri: Secondary | ICD-10-CM

## 2018-12-31 LAB — LIPID PANEL
Cholesterol: 154 mg/dL (ref 0–200)
HDL: 49.6 mg/dL (ref >39.00)
LDL Cholesterol: 84 mg/dL (ref 0–99)
NONHDL: 104.53
Total CHOL/HDL Ratio: 3
Triglycerides: 104 mg/dL (ref 0.0–149.0)
VLDL: 20.8 mg/dL (ref 0.0–40.0)

## 2018-12-31 LAB — CBC WITH DIFFERENTIAL/PLATELET
BASOS ABS: 0.1 10*3/uL (ref 0.0–0.1)
Basophils Relative: 1.5 % (ref 0.0–3.0)
Eosinophils Absolute: 0.2 10*3/uL (ref 0.0–0.7)
Eosinophils Relative: 4.1 % (ref 0.0–5.0)
HCT: 41.4 % (ref 36.0–46.0)
Hemoglobin: 13.6 g/dL (ref 12.0–15.0)
Lymphocytes Relative: 35.3 % (ref 12.0–46.0)
Lymphs Abs: 1.4 10*3/uL (ref 0.7–4.0)
MCHC: 32.9 g/dL (ref 30.0–36.0)
MCV: 85.1 fl (ref 78.0–100.0)
MONO ABS: 0.4 10*3/uL (ref 0.1–1.0)
Monocytes Relative: 10.9 % (ref 3.0–12.0)
Neutro Abs: 1.9 10*3/uL (ref 1.4–7.7)
Neutrophils Relative %: 48.2 % (ref 43.0–77.0)
Platelets: 328 10*3/uL (ref 150.0–400.0)
RBC: 4.86 Mil/uL (ref 3.87–5.11)
RDW: 14.3 % (ref 11.5–15.5)
WBC: 4 10*3/uL (ref 4.0–10.5)

## 2018-12-31 LAB — HEPATIC FUNCTION PANEL
ALK PHOS: 62 U/L (ref 39–117)
ALT: 13 U/L (ref 0–35)
AST: 15 U/L (ref 0–37)
Albumin: 4 g/dL (ref 3.5–5.2)
BILIRUBIN TOTAL: 0.6 mg/dL (ref 0.2–1.2)
Bilirubin, Direct: 0.1 mg/dL (ref 0.0–0.3)
Total Protein: 6.6 g/dL (ref 6.0–8.3)

## 2018-12-31 LAB — BASIC METABOLIC PANEL
BUN: 8 mg/dL (ref 6–23)
CHLORIDE: 103 meq/L (ref 96–112)
CO2: 31 mEq/L (ref 19–32)
Calcium: 9.2 mg/dL (ref 8.4–10.5)
Creatinine, Ser: 0.79 mg/dL (ref 0.40–1.20)
GFR: 89.63 mL/min (ref >60.00)
Glucose, Bld: 90 mg/dL (ref 70–99)
POTASSIUM: 3.9 meq/L (ref 3.5–5.1)
Sodium: 141 mEq/L (ref 135–145)

## 2018-12-31 LAB — TSH: TSH: 1.39 u[IU]/mL (ref 0.35–4.50)

## 2018-12-31 LAB — VITAMIN D 25 HYDROXY (VIT D DEFICIENCY, FRACTURES): VITD: 26.94 ng/mL — ABNORMAL LOW (ref 30.00–100.00)

## 2018-12-31 NOTE — Progress Notes (Signed)
Patient ID: Jackie White, female   DOB: August 05, 1956, 63 y.o.   MRN: [id number]   Subjective:    Patient ID: Jackie White, female    DOB: 1956/02/25, 63 y.o.   MRN: [id number]  HPI  Patient here for her physical exam.  Was seen for urgent visit 12/24/18 and diagnosed with flu.  Treated with tamiflu.  Better from the flu.  Still with persistent cough.   Had cough prior to getting the flu.  Chronic cough.  Unclear etiology.  Also report some sob with exertion.  No chest pain.  Discussed further cardiac w/up.  Sees neurology for f/u MS.  Stable on ocrelizumab infusions.  Has f/u next month.  Tries to stay active.  No acid reflux.  No abdominal pain.  Bowels moving.  No urine change.     Past Medical History:  Diagnosis Date  . Abnormal Pap smear   . Cervical disc disease    Cervical and lumbar disc disease  . GERD (gastroesophageal reflux disease)   . History of iron deficiency   . Hypertension   . Multiple sclerosis (HCC)   . Personal history of colonic polyps   . Reactive airway disease   . Vitamin D deficiency    Past Surgical History:  Procedure Laterality Date  . BREAST REDUCTION SURGERY    . REDUCTION MAMMAPLASTY Bilateral 2002  . TOTAL ABDOMINAL HYSTERECTOMY  2003   Right Oophrectomy   Family History  Problem Relation Age of Onset  . Stroke Mother   . Hypertension Mother   . Heart disease Maternal Grandmother   . Breast cancer Neg Hx    Social History   Socioeconomic History  . Marital status: Married    Spouse name: Not on file  . Number of children: Not on file  . Years of education: Not on file  . Highest education level: Not on file  Occupational History  . Not on file  Social Needs  . Financial resource strain: Not on file  . Food insecurity:    Worry: Not on file    Inability: Not on file  . Transportation needs:    Medical: Not on file    Non-medical: Not on file  Tobacco Use  . Smoking status: Never Smoker  . Smokeless tobacco: Never Used    Substance and Sexual Activity  . Alcohol use: Yes    Alcohol/week: 0.0 standard drinks  . Drug use: No  . Sexual activity: Not on file  Lifestyle  . Physical activity:    Days per week: Not on file    Minutes per session: Not on file  . Stress: Not on file  Relationships  . Social connections:    Talks on phone: Not on file    Gets together: Not on file    Attends religious service: Not on file    Active member of club or organization: Not on file    Attends meetings of clubs or organizations: Not on file    Relationship status: Not on file  Other Topics Concern  . Not on file  Social History Narrative  . Not on file    Outpatient Encounter Medications as of 12/31/2018  Medication Sig  . albuterol (PROVENTIL HFA;VENTOLIN HFA) 108 (90 Base) MCG/ACT inhaler Inhale 2 puffs into the lungs every 6 (six) hours as needed for wheezing or shortness of breath.  . benzonatate (TESSALON) 200 MG capsule Take one cap TID PRN cough  . metoprolol succinate (TOPROL-XL) 25 MG 24  hr tablet TAKE 1 TABLET(25 MG) BY MOUTH DAILY  . Ocrelizumab (OCREVUS IV) Inject into the vein.  . valsartan-hydrochlorothiazide (DIOVAN-HCT) 320-25 MG tablet TAKE 1 TABLET BY MOUTH DAILY  . VITAMIN D, ERGOCALCIFEROL, PO Take by mouth.  . [DISCONTINUED] chlorpheniramine-HYDROcodone (TUSSIONEX PENNKINETIC ER) 10-8 MG/5ML SUER Take 5 mLs by mouth 2 (two) times daily.  . [DISCONTINUED] esomeprazole (NEXIUM) 40 MG capsule TAKE 1 CAPSULE(40 MG) BY MOUTH DAILY  . [DISCONTINUED] oseltamivir (TAMIFLU) 75 MG capsule Take 1 capsule (75 mg total) by mouth every 12 (twelve) hours.   No facility-administered encounter medications on file as of 12/31/2018.     Review of Systems  Constitutional: Negative for appetite change and unexpected weight change.  HENT: Negative for congestion and sinus pressure.   Eyes: Negative for pain and visual disturbance.  Respiratory: Positive for cough and shortness of breath. Negative for chest  tightness.   Cardiovascular: Negative for chest pain, palpitations and leg swelling.  Gastrointestinal: Negative for abdominal pain, diarrhea, nausea and vomiting.  Genitourinary: Negative for difficulty urinating and dysuria.  Musculoskeletal: Negative for joint swelling and myalgias.  Skin: Negative for color change and rash.  Neurological: Negative for dizziness, light-headedness and headaches.  Hematological: Negative for adenopathy. Does not bruise/bleed easily.  Psychiatric/Behavioral: Negative for agitation and dysphoric mood.       Objective:    Physical Exam Constitutional:      General: She is not in acute distress.    Appearance: Normal appearance. She is well-developed.  HENT:     Nose: Nose normal. No congestion.     Mouth/Throat:     Pharynx: No oropharyngeal exudate or posterior oropharyngeal erythema.  Eyes:     General: No scleral icterus.       Right eye: No discharge.        Left eye: No discharge.  Neck:     Musculoskeletal: Neck supple. No muscular tenderness.     Thyroid: No thyromegaly.  Cardiovascular:     Rate and Rhythm: Normal rate and regular rhythm.  Pulmonary:     Effort: No tachypnea, accessory muscle usage or respiratory distress.     Breath sounds: Normal breath sounds. No decreased breath sounds or wheezing.  Chest:     Breasts:        Right: No inverted nipple, mass, nipple discharge or tenderness (no axillary adenopathy).        Left: No inverted nipple, mass, nipple discharge or tenderness (no axilarry adenopathy).  Abdominal:     General: Bowel sounds are normal.     Palpations: Abdomen is soft.     Tenderness: There is no abdominal tenderness.  Musculoskeletal:        General: No swelling or tenderness.  Lymphadenopathy:     Cervical: No cervical adenopathy.  Skin:    Findings: No erythema or rash.  Neurological:     Mental Status: She is alert and oriented to person, place, and time.  Psychiatric:        Mood and Affect: Mood  normal.        Behavior: Behavior normal.     BP 130/78   Pulse 61   Temp 99.1 F (37.3 C) (Oral)   Ht 5\' 7"  (1.702 m)   Wt 226 lb 12.8 oz (102.9 kg)   LMP 12/27/2000   SpO2 98%   BMI 35.52 kg/m  Wt Readings from Last 3 Encounters:  12/31/18 226 lb 12.8 oz (102.9 kg)  12/24/18 216 lb (98 kg)  11/29/18  232 lb 9.6 oz (105.5 kg)     Lab Results  Component Value Date   WBC 4.0 12/31/2018   HGB 13.6 12/31/2018   HCT 41.4 12/31/2018   PLT 328.0 12/31/2018   GLUCOSE 90 12/31/2018   CHOL 154 12/31/2018   TRIG 104.0 12/31/2018   HDL 49.60 12/31/2018   LDLCALC 84 12/31/2018   ALT 13 12/31/2018   AST 15 12/31/2018   NA 141 12/31/2018   K 3.9 12/31/2018   CL 103 12/31/2018   CREATININE 0.79 12/31/2018   BUN 8 12/31/2018   CO2 31 12/31/2018   TSH 1.39 12/31/2018       Assessment & Plan:   Problem List Items Addressed This Visit    Abnormal Pap smear of cervix    Followed at [facility].       Anemia    Follow cbc.        Cough    Persistent cough.  See recent notes.  Refer to pulmonary.        Relevant Orders   Ambulatory referral to Pulmonology   Essential hypertension, benign    Blood pressure under good control.  Continue same medication regimen.  Follow pressures.  Follow metabolic panel.        Relevant Orders   CBC with Differential/Platelet (Completed)   TSH (Completed)   Basic metabolic panel (Completed)   GERD (gastroesophageal reflux disease)    Controlled on current regimen.  Follow.        Health care maintenance    Physical today 12/31/18.  PAP 08/30/17 - negative with negative HPV.  Mammogram 10/02/18 - birads I.  Colonoscopy 2015.        Hypercholesterolemia    Low cholesterol diet and exercise.  Follow lipid panel.        Relevant Orders   Hepatic function panel (Completed)   Lipid panel (Completed)   Multiple sclerosis (HCC)    Followed by neurology.  Stable.        SOB (shortness of breath)    Shortness of breath with  exertion.  EKG - SR with no acute ischemic changes.  Will have cardiology evaluate with question of need for further cardiac evaluation.        Relevant Orders   EKG 12-Lead (Completed)   Ambulatory referral to Cardiology   Ambulatory referral to Pulmonology   Vitamin D deficiency    Follow vitamin D level.        Relevant Orders   VITAMIN D 25 Hydroxy (Vit-D Deficiency, Fractures) (Completed)    Other Visit Diagnoses    Routine general medical examination at a health care facility    -  Primary       Dale Corry, MD

## 2018-12-31 NOTE — Assessment & Plan Note (Signed)
Physical today 12/31/18.  PAP 08/30/17 - negative with negative HPV.  Mammogram 10/02/18 - birads I.  Colonoscopy 2015.

## 2019-01-02 ENCOUNTER — Other Ambulatory Visit: Payer: Self-pay | Admitting: Internal Medicine

## 2019-01-02 MED ORDER — ESOMEPRAZOLE MAGNESIUM 40 MG PO CPDR: 40.0000 mg | DELAYED_RELEASE_CAPSULE | Freq: Every day | ORAL | 1 refills | Status: DC

## 2019-01-05 ENCOUNTER — Encounter: Payer: Self-pay | Admitting: Internal Medicine

## 2019-01-05 DIAGNOSIS — R0602 Shortness of breath: Secondary | ICD-10-CM | POA: Insufficient documentation

## 2019-01-05 DIAGNOSIS — R059 Cough, unspecified: Secondary | ICD-10-CM | POA: Insufficient documentation

## 2019-01-05 DIAGNOSIS — R05 Cough: Secondary | ICD-10-CM | POA: Insufficient documentation

## 2019-01-05 NOTE — Assessment & Plan Note (Signed)
Persistent cough.  See recent notes.  Refer to pulmonary.

## 2019-01-05 NOTE — Assessment & Plan Note (Signed)
Low cholesterol diet and exercise.  Follow lipid panel.   

## 2019-01-05 NOTE — Assessment & Plan Note (Signed)
Followed by neurology.  Stable  

## 2019-01-05 NOTE — Assessment & Plan Note (Signed)
Followed at [facility].

## 2019-01-05 NOTE — Assessment & Plan Note (Signed)
Follow vitamin D level.  

## 2019-01-05 NOTE — Assessment & Plan Note (Signed)
Follow cbc.  

## 2019-01-05 NOTE — Assessment & Plan Note (Signed)
Blood pressure under good control.  Continue same medication regimen.  Follow pressures.  Follow metabolic panel.   

## 2019-01-05 NOTE — Assessment & Plan Note (Signed)
Controlled on current regimen.  Follow.  

## 2019-01-05 NOTE — Assessment & Plan Note (Signed)
Shortness of breath with exertion.  EKG - SR with no acute ischemic changes.  Will have cardiology evaluate with question of need for further cardiac evaluation.

## 2019-01-15 ENCOUNTER — Other Ambulatory Visit
Admission: RE | Admit: 2019-01-15 | Discharge: 2019-01-15 | Disposition: A | Discharge status: Home / Self Care | Payer: BC Managed Care – PPO | Source: Ambulatory Visit | Attending: Pulmonary Disease | Admitting: Pulmonary Disease

## 2019-01-15 ENCOUNTER — Ambulatory Visit: Payer: BC Managed Care – PPO | Admitting: Pulmonary Disease

## 2019-01-15 ENCOUNTER — Encounter: Payer: Self-pay | Admitting: Pulmonary Disease

## 2019-01-15 VITALS — BP 122/84 | HR 62 | Ht 67.0 in | Wt 224.8 lb

## 2019-01-15 DIAGNOSIS — E669 Obesity, unspecified: Secondary | ICD-10-CM | POA: Diagnosis not present

## 2019-01-15 DIAGNOSIS — J3089 Other allergic rhinitis: Secondary | ICD-10-CM | POA: Insufficient documentation

## 2019-01-15 DIAGNOSIS — R059 Cough, unspecified: Secondary | ICD-10-CM

## 2019-01-15 DIAGNOSIS — R05 Cough: Secondary | ICD-10-CM

## 2019-01-15 MED ORDER — MONTELUKAST SODIUM 10 MG PO TABS: 10.0000 mg | ORAL_TABLET | Freq: Every day | ORAL | 3 refills | Status: DC

## 2019-01-15 NOTE — Progress Notes (Signed)
Subjective:    Patient ID: Jackie White, female    DOB: 1956/09/25, 63 y.o.   MRN: [id number]  HPI Is a 63 year old lifelong never smoker, who presents for evaluation and management of cough.  The patient is kindly referred by Dr. Dale Olivet.  The patient notes that in around July she developed a cough that was mostly nonproductive.  When it is productive it is usually whitish sputum that she produces.  She has not had any hemoptysis.  She noted that she had increasing issues with postnasal drip and was initially treated with Nasacort and prednisone.  She notes that these interventions helped however she has not been as consistent with using the Nasacort.  She notes persistent postnasal drip even when she uses the Nasacort and sometimes this gives her a globus sensation.  She was given also on albuterol inhaler however she notes that this may help at times but other times does not.  She states that when she lays down at night she notices a "rattling" on the right side of her chest.  She has not had any fevers, chills or sweats.  She has not had any orthopnea or paroxysmal nocturnal dyspnea per se.  No lower extremity edema.  He continues to have issues with nasal congestion and postnasal drip.  The patient notes that she had asthma as a child but "outgrew it" she also notes that her mother had "bad" asthma.  Patient does have issues with gastroesophageal reflux however these are well controlled with Nexium.  She has been consistent taking this medication.  She had a chest x-ray performed on 29 November 2018 which was normal.  I have independently reviewed the films.   Past medical history, surgical history, and family history has been reviewed.  They are as noted.  Social history: She has not had any significant occupational exposure.  She has never been in the [employer].  She has resided in [place] prior to relocating to [place].  She has not traveled outside of the country.  No exposure  to tuberculosis.  She has been tested for TB and has been negative in the past.  No exotic animals in the home.  Review of Systems  Constitutional: Negative.   HENT: Positive for congestion and postnasal drip.        Globus sensation  Eyes: Negative.   Respiratory: Positive for cough and wheezing. Negative for choking, chest tightness and shortness of breath.   Cardiovascular: Negative.   Gastrointestinal: Negative.   Endocrine: Negative.   Genitourinary: Negative.   Musculoskeletal: Negative.   Skin: Negative.   Allergic/Immunologic: Positive for environmental allergies.  Neurological: Negative.   Hematological: Negative.   Psychiatric/Behavioral: Negative.   All other systems reviewed and are negative.      Objective:   Physical Exam Constitutional:      General: She is not in acute distress.    Appearance: She is well-developed. She is obese. She is not ill-appearing.  HENT:     Head: Normocephalic and atraumatic.     Nose: Mucosal edema and congestion present.     Right Turbinates: Swollen.     Left Turbinates: Swollen.     Mouth/Throat:     Mouth: Mucous membranes are moist.     Pharynx: Uvula midline. No oropharyngeal exudate.     Comments: Clear postnasal drip noted. Eyes:     Extraocular Movements: Extraocular movements intact.     Pupils: Pupils are equal, round, and reactive to light.  Neck:     Musculoskeletal: Neck supple.     Thyroid: No thyromegaly.     Vascular: No JVD.     Trachea: No tracheal deviation.  Cardiovascular:     Rate and Rhythm: Normal rate and regular rhythm.     Pulses: Normal pulses.     Heart sounds: Normal heart sounds.  Pulmonary:     Effort: Pulmonary effort is normal.     Breath sounds: Normal breath sounds.  Abdominal:     General: Abdomen is protuberant.  Musculoskeletal: Normal range of motion.     Right lower leg: No edema.     Left lower leg: No edema.  Lymphadenopathy:     Cervical: No cervical adenopathy.  Skin:     General: Skin is warm and dry.  Neurological:     General: No focal deficit present.     Mental Status: She is alert and oriented to person, place, and time.  Psychiatric:        Mood and Affect: Mood normal.        Behavior: Behavior normal.     Spirometry was performed today this did not show any evidence of obstruction.  It was essentially normal.  FEV1 was 2.0 L or 87% predicted.  FEV1/FVC was 84%.      Assessment & Plan:   1.  Cough: In non-smokers cough is usually related to upper airway cough syndrome previously noted as postnasal drip syndrome or gastroesophageal reflux/LPR issues.  She has evidence of copious postnasal drip and nasal turbinate edema.  We will give her a trial of Singulair as noted below.  2.  Perennial allergic rhinitis: We have ordered IgE and RAST panel.  She may need referral to allergy.  She will be given a trial of montelukast 1 tablet daily.  She may also use either Zyrtec or Allegra for breakthrough symptoms.  I have recommended that she use the Nasacort consistently and perform nasal hygiene.  We will see her in follow-up in 4 weeks time she is to contact us prior to that time should any new difficulties arise.  3.  Obesity with BMI of 35.2: This issue adds complexity to her management.   Thank you for allowing me to participate in this patient's care.      This chart was dictated using voice recognition software/Dragon.  Despite best efforts to proofread, errors can occur which can change the meaning.  Any change was purely unintentional.

## 2019-01-15 NOTE — Patient Instructions (Signed)
1.  We will give you a trial of montelukast (Singulair) 1 tablet daily.  2.  Continue using Nasacort.  You might also use either Zyrtec at bedtime or Allegra during the day to control symptoms.  3.  We will see him in follow-up in 4 weeks time call sooner should any new difficulties arise.

## 2019-01-15 NOTE — Progress Notes (Signed)
i

## 2019-01-16 ENCOUNTER — Encounter: Payer: Self-pay | Admitting: Pulmonary Disease

## 2019-01-18 LAB — ALLERGENS W/TOTAL IGE AREA 2
Alternaria Alternata IgE: 0.64 kU/L — AB
Aspergillus Fumigatus IgE: <0.1 kU/L
Bermuda Grass IgE: <0.1 kU/L
Cat Dander IgE: <0.1 kU/L
Cedar, Mountain IgE: <0.1 kU/L
Cladosporium Herbarum IgE: <0.1 kU/L
Cockroach, German IgE: <0.1 kU/L
Common Silver Birch IgE: 0.18 kU/L — AB
Cottonwood IgE: <0.1 kU/L
D Farinae IgE: <0.1 kU/L
D Pteronyssinus IgE: <0.1 kU/L
Elm, American IgE: <0.1 kU/L
IgE (Immunoglobulin E), Serum: 24 IU/mL (ref 6–495)
Johnson Grass IgE: <0.1 kU/L
Maple/Box Elder IgE: <0.1 kU/L
Mouse Urine IgE: <0.1 kU/L
Pecan, Hickory IgE: <0.1 kU/L
Penicillium Chrysogen IgE: <0.1 kU/L
Pigweed, Rough IgE: <0.1 kU/L
Ragweed, Short IgE: 0.4 kU/L — AB
Sheep Sorrel IgE Qn: <0.1 kU/L
Timothy Grass IgE: 0.16 kU/L — AB
White Mulberry IgE: <0.1 kU/L

## 2019-01-29 ENCOUNTER — Telehealth: Payer: Self-pay

## 2019-01-29 NOTE — Telephone Encounter (Signed)
Spoke to patient, she is aware of allergy results. Stated the singulair has helped. Will be at f/u apt. Nothing further needed at this time.

## 2019-01-29 NOTE — Telephone Encounter (Signed)
-----   Message from Salena Saner, MD sent at 01/28/2019  4:29 PM EDT ----- Few mild allergies but not dramatic

## 2019-02-07 ENCOUNTER — Other Ambulatory Visit: Payer: Self-pay

## 2019-02-07 MED ORDER — METOPROLOL SUCCINATE ER 25 MG PO TB24: ORAL_TABLET | ORAL | 0 refills | Status: DC

## 2019-02-10 ENCOUNTER — Telehealth: Payer: Self-pay

## 2019-02-10 ENCOUNTER — Ambulatory Visit: Payer: BC Managed Care – PPO | Admitting: Internal Medicine

## 2019-02-10 ENCOUNTER — Other Ambulatory Visit: Payer: Self-pay

## 2019-02-10 ENCOUNTER — Encounter: Payer: Self-pay | Admitting: Internal Medicine

## 2019-02-10 DIAGNOSIS — I10 Essential (primary) hypertension: Secondary | ICD-10-CM

## 2019-02-10 DIAGNOSIS — E7800 Pure hypercholesterolemia, unspecified: Secondary | ICD-10-CM

## 2019-02-10 DIAGNOSIS — K219 Gastro-esophageal reflux disease without esophagitis: Secondary | ICD-10-CM

## 2019-02-10 DIAGNOSIS — D649 Anemia, unspecified: Secondary | ICD-10-CM | POA: Diagnosis not present

## 2019-02-10 DIAGNOSIS — R05 Cough: Secondary | ICD-10-CM

## 2019-02-10 DIAGNOSIS — R059 Cough, unspecified: Secondary | ICD-10-CM

## 2019-02-10 DIAGNOSIS — E559 Vitamin D deficiency, unspecified: Secondary | ICD-10-CM

## 2019-02-10 DIAGNOSIS — G35 Multiple sclerosis: Secondary | ICD-10-CM

## 2019-02-10 NOTE — Assessment & Plan Note (Signed)
Follow cbc.  

## 2019-02-10 NOTE — Assessment & Plan Note (Signed)
Blood pressure under good control.  Continue same medication regimen.  Follow pressures.  Follow metabolic panel.   

## 2019-02-10 NOTE — Telephone Encounter (Signed)
Pt called back and stated that she is ok and does not need to be seen right now. Will go ahead and reschedule for later date. Nothing further needed.

## 2019-02-10 NOTE — Assessment & Plan Note (Signed)
Followed by neurology.  Just evaluated  12/2018.  Stable.

## 2019-02-10 NOTE — Progress Notes (Signed)
Patient ID: Jackie White, female   DOB: Dec 28, 1955, 63 y.o.   MRN: [id number]   Subjective:    Patient ID: Jackie BastaMarida J White, female    DOB: Dec 28, 1955, 63 y.o.   MRN: [id number]  HPI  Patient here for a scheduled follow up.  Saw Dr Elwyn ReachSkeen 02/03/19 for f/u MS.  Stable.  Recommended f/u in 6 months.   Also has been seeing pulmonary.  Last evaluated 01/15/19.  Was placed on singulair. Also instructed to use nasacort.  Reports doing well on this regimen.  Breathing better.  No cough or congestion.  No acid reflux.  No abdominal pain.  Bowels moving.  Feels MS - stable.  Overall she feels she is doing well.      Past Medical History:  Diagnosis Date  . Abnormal Pap smear   . Cervical disc disease    Cervical and lumbar disc disease  . GERD (gastroesophageal reflux disease)   . History of iron deficiency   . Hypertension   . Multiple sclerosis (HCC)   . Personal history of colonic polyps   . Reactive airway disease   . Vitamin D deficiency    Past Surgical History:  Procedure Laterality Date  . BREAST REDUCTION SURGERY    . REDUCTION MAMMAPLASTY Bilateral 2002  . TOTAL ABDOMINAL HYSTERECTOMY  2003   Right Oophrectomy   Family History  Problem Relation Age of Onset  . Stroke Mother   . Hypertension Mother   . Heart disease Maternal Grandmother   . Breast cancer Neg Hx    Social History   Socioeconomic History  . Marital status: Married    Spouse name: Not on file  . Number of children: Not on file  . Years of education: Not on file  . Highest education level: Not on file  Occupational History  . Not on file  Social Needs  . Financial resource strain: Not on file  . Food insecurity:    Worry: Not on file    Inability: Not on file  . Transportation needs:    Medical: Not on file    Non-medical: Not on file  Tobacco Use  . Smoking status: Never Smoker  . Smokeless tobacco: Never Used  Substance and Sexual Activity  . Alcohol use: Yes    Alcohol/week: 0.0 standard  drinks  . Drug use: No  . Sexual activity: Not on file  Lifestyle  . Physical activity:    Days per week: Not on file    Minutes per session: Not on file  . Stress: Not on file  Relationships  . Social connections:    Talks on phone: Not on file    Gets together: Not on file    Attends religious service: Not on file    Active member of club or organization: Not on file    Attends meetings of clubs or organizations: Not on file    Relationship status: Not on file  Other Topics Concern  . Not on file  Social History Narrative  . Not on file    Outpatient Encounter Medications as of 02/10/2019  Medication Sig  . albuterol (PROVENTIL HFA;VENTOLIN HFA) 108 (90 Base) MCG/ACT inhaler Inhale 2 puffs into the lungs every 6 (six) hours as needed for wheezing or shortness of breath.  . esomeprazole (NEXIUM) 40 MG capsule Take 1 capsule (40 mg total) by mouth daily.  . metoprolol succinate (TOPROL-XL) 25 MG 24 hr tablet TAKE 1 TABLET(25 MG) BY MOUTH DAILY  . montelukast (  SINGULAIR) 10 MG tablet Take 1 tablet (10 mg total) by mouth daily.  [employer] (OCREVUS IV) Inject into the vein.  . valsartan-hydrochlorothiazide (DIOVAN-HCT) 320-25 MG tablet TAKE 1 TABLET BY MOUTH DAILY  . VITAMIN D, ERGOCALCIFEROL, PO Take by mouth.   No facility-administered encounter medications on file as of 02/10/2019.     Review of Systems  Constitutional: Negative for appetite change and unexpected weight change.  HENT: Negative for congestion and sinus pressure.   Respiratory: Negative for cough, chest tightness and shortness of breath.   Cardiovascular: Negative for chest pain, palpitations and leg swelling.  Gastrointestinal: Negative for abdominal pain, diarrhea, nausea and vomiting.  Genitourinary: Negative for difficulty urinating and dysuria.  Musculoskeletal: Negative for joint swelling and myalgias.  Skin: Negative for color change and rash.  Neurological: Negative for dizziness, light-headedness  and headaches.  Psychiatric/Behavioral: Negative for agitation and dysphoric mood.       Objective:    Physical Exam Constitutional:      General: She is not in acute distress.    Appearance: Normal appearance.  HENT:     Nose: Nose normal. No congestion.     Mouth/Throat:     Pharynx: No oropharyngeal exudate or posterior oropharyngeal erythema.  Neck:     Musculoskeletal: Neck supple. No muscular tenderness.     Thyroid: No thyromegaly.  Cardiovascular:     Rate and Rhythm: Normal rate and regular rhythm.  Pulmonary:     Effort: No respiratory distress.     Breath sounds: Normal breath sounds. No wheezing.  Abdominal:     General: Bowel sounds are normal.     Palpations: Abdomen is soft.     Tenderness: There is no abdominal tenderness.  Musculoskeletal:        General: No swelling or tenderness.  Lymphadenopathy:     Cervical: No cervical adenopathy.  Skin:    Findings: No erythema or rash.  Neurological:     Mental Status: She is alert.  Psychiatric:        Mood and Affect: Mood normal.        Behavior: Behavior normal.     BP 126/76   Pulse 61   Temp 98.4 F (36.9 C) (Oral)   Resp 16   Wt 221 lb 12.8 oz (100.6 kg)   LMP 12/27/2000   SpO2 97%   BMI 34.74 kg/m  Wt Readings from Last 3 Encounters:  02/10/19 221 lb 12.8 oz (100.6 kg)  01/15/19 224 lb 12.8 oz (102 kg)  12/31/18 226 lb 12.8 oz (102.9 kg)     Lab Results  Component Value Date   WBC 4.0 12/31/2018   HGB 13.6 12/31/2018   HCT 41.4 12/31/2018   PLT 328.0 12/31/2018   GLUCOSE 90 12/31/2018   CHOL 154 12/31/2018   TRIG 104.0 12/31/2018   HDL 49.60 12/31/2018   LDLCALC 84 12/31/2018   ALT 13 12/31/2018   AST 15 12/31/2018   NA 141 12/31/2018   K 3.9 12/31/2018   CL 103 12/31/2018   CREATININE 0.79 12/31/2018   BUN 8 12/31/2018   CO2 31 12/31/2018   TSH 1.39 12/31/2018       Assessment & Plan:   Problem List Items Addressed This Visit    Anemia    Follow cbc.       Cough     Saw pulmonary.  Doing well on singulair.  Follow.        Essential hypertension, benign    Blood  pressure under good control.  Continue same medication regimen.  Follow pressures.  Follow metabolic panel.        GERD (gastroesophageal reflux disease)    Controlled on current regimen.  Follow.        Hypercholesterolemia    Low cholesterol diet and exercise.  Follow lipid panel.      Multiple sclerosis (HCC)    Followed by neurology.  Just evaluated  12/2018.  Stable.        Vitamin D deficiency    On vitamin D supplements.  Follow vitamin D level.           [place], MD

## 2019-02-10 NOTE — Assessment & Plan Note (Signed)
Low cholesterol diet and exercise.  Follow lipid panel.   

## 2019-02-10 NOTE — Assessment & Plan Note (Signed)
On vitamin D supplements.  Follow vitamin D level.   

## 2019-02-10 NOTE — Assessment & Plan Note (Signed)
Controlled on current regimen.  Follow.  

## 2019-02-10 NOTE — Telephone Encounter (Signed)
LM for patient to let us know how she is doing. Need to be seen or r/s?

## 2019-02-10 NOTE — Assessment & Plan Note (Signed)
Saw pulmonary.  Doing well on singulair.  Follow.

## 2019-02-11 ENCOUNTER — Ambulatory Visit: Payer: BC Managed Care – PPO | Admitting: Pulmonary Disease

## 2019-02-19 ENCOUNTER — Telehealth: Payer: Self-pay

## 2019-02-19 NOTE — Telephone Encounter (Signed)
Virtual Visit Pre-Appointment Phone Call  Steps For Call:  1. Confirm consent - "In the setting of the current Covid19 crisis, you are scheduled for a VIDEO visit with your provider on 02/21/2019 at 9:00am.  Just as we do with many in-office visits, in order for you to participate in this visit, we must obtain consent.  If you'd like, I can send this to your mychart (if signed up) or email for you to review.  Otherwise, I can obtain your verbal consent now.  All virtual visits are billed to your insurance company just like a normal visit would be.  By agreeing to a virtual visit, we'd like you to understand that the technology does not allow for your provider to perform an examination, and thus may limit your provider's ability to fully assess your condition.  Finally, though the technology is pretty good, we cannot assure that it will always work on either your or our end, and in the setting of a video visit, we may have to convert it to a phone-only visit.  In either situation, we cannot ensure that we have a secure connection.  Are you willing to proceed?"  2. Give patient instructions for WebEx download to smartphone as below if video visit  3. Advise patient to be prepared with any vital sign or heart rhythm information, their current medicines, and a piece of paper and pen handy for any instructions they may receive the day of their visit  4. Inform patient they will receive a phone call 15 minutes prior to their appointment time (may be from unknown caller ID) so they should be prepared to answer  5. Confirm that appointment type is correct in Epic appointment notes (video vs telephone)    TELEPHONE CALL NOTE  TOSCHA MOOSBRUGGER has been deemed a candidate for a follow-up tele-health visit to limit community exposure during the Covid-19 pandemic. I spoke with the patient via phone to ensure availability of phone/video source, confirm preferred email & phone number, and discuss  instructions and expectations.  I reminded Jackie White to be prepared with any vital sign and/or heart rhythm information that could potentially be obtained via home monitoring, at the time of her visit. I reminded EMIRA WIGLEY to expect a phone call at the time of her visit if her visit.  Did the patient verbally acknowledge consent to treatment? YES  Margrett Rud, [place] 02/19/2019 1:06 PM    CONSENT FOR TELE-HEALTH VISIT - PLEASE REVIEW  I hereby voluntarily request, consent and authorize [facility] and its employed or contracted physicians, physician assistants, nurse practitioners or other licensed health care professionals (the Practitioner), to provide me with telemedicine health care services (the Services") as deemed necessary by the treating Practitioner. I acknowledge and consent to receive the Services by the Practitioner via telemedicine. I understand that the telemedicine visit will involve communicating with the Practitioner through live audiovisual communication technology and the disclosure of certain medical information by electronic transmission. I acknowledge that I have been given the opportunity to request an in-person assessment or other available alternative prior to the telemedicine visit and am voluntarily participating in the telemedicine visit.  I understand that I have the right to withhold or withdraw my consent to the use of telemedicine in the course of my care at any time, without affecting my right to future care or treatment, and that the Practitioner or I may terminate the telemedicine visit at any time. I understand that  I have the right to inspect all information obtained and/or recorded in the course of the telemedicine visit and may receive copies of available information for a reasonable fee.  I understand that some of the potential risks of receiving the Services via telemedicine include:   Delay or interruption in medical evaluation due to  technological equipment failure or disruption;  Information transmitted may not be sufficient (e.g. poor resolution of images) to allow for appropriate medical decision making by the Practitioner; and/or   In rare instances, security protocols could fail, causing a breach of personal health information.  Furthermore, I acknowledge that it is my responsibility to provide information about my medical history, conditions and care that is complete and accurate to the best of my ability. I acknowledge that Practitioner's advice, recommendations, and/or decision may be based on factors not within their control, such as incomplete or inaccurate data provided by me or distortions of diagnostic images or specimens that may result from electronic transmissions. I understand that the practice of medicine is not an exact science and that Practitioner makes no warranties or guarantees regarding treatment outcomes. I acknowledge that I will receive a copy of this consent concurrently upon execution via email to the email address I last provided but may also request a printed copy by calling the office of [facility].    I understand that my insurance will be billed for this visit.   I have read or had this consent read to me.  I understand the contents of this consent, which adequately explains the benefits and risks of the Services being provided via telemedicine.   I have been provided ample opportunity to ask questions regarding this consent and the Services and have had my questions answered to my satisfaction.  I give my informed consent for the services to be provided through the use of telemedicine in my medical care  By participating in this telemedicine visit I agree to the above.

## 2019-02-20 NOTE — Progress Notes (Addendum)
Virtual Visit via Video Note   This visit type was conducted due to national recommendations for restrictions regarding the COVID-19 Pandemic (e.g. social distancing) in an effort to limit this patient's exposure and mitigate transmission in our community.  Due to her co-morbid illnesses, this patient is at least at moderate risk for complications without adequate follow up.  This format is felt to be most appropriate for this patient at this time.  All issues noted in this document were discussed and addressed.  A limited physical exam was performed with this format.  Please refer to the patient's chart for her consent to telehealth for [facility].    Date:  02/20/2019   ID:  Jackie White, Jackie White 15-Mar-1956, MRN [id number]  Patient Location:  [address] [place] [address]   Provider location:   [facility], [place] office  PCP:  Dale El Campo, MD  Cardiologist:  Fonnie Mu  Chief Complaint:  SOB, family hx of CAD, ABN EKG  New Patient, WEBCAM visit   History of Present Illness:    Jackie White is a 63 y.o. female who presents via audio/video conferencing for a telehealth visit today.   The patient does not symptoms concerning for COVID-19 infection (fever, chills, cough, or new SHORTNESS OF BREATH).   Patient has a past medical history of MS Anemia Chronic cough, better on singulair GERD Referred by Dr. Lorin Picket for SOB (seen on 12/31/2018)  works at [employer] as a Sports coach.   Back in February she developed Temperature 101.1 pulse rate of 78 blood pressure 118/72 respiration 16 O2 sats 100%. Flu sx on 12/24/18 Seen in urgent care Treated with tamiflu  Seen shortly after by primary care 1 week later  SOB when getting over the flu Thought it was just congestion Now resolved Feels back to normal  EKG performed December 31, 2018: NSR with diffuse T wave inversion V3 through V6, 3 and aVF  No diabetes, non smoker HCT 41 Total chol 154 CXR  11/2018: normal  Infusion  Q6 months for MS Disease has been relatively well controlled past 2 years  Family hx Aunt has amyloid CAD   Prior CV studies:   The following studies were reviewed today:  Chest x-ray January 2020 normal study  Past Medical History:  Diagnosis Date  . Abnormal Pap smear   . Cervical disc disease    Cervical and lumbar disc disease  . GERD (gastroesophageal reflux disease)   . History of iron deficiency   . Hypertension   . Multiple sclerosis (HCC)   . Personal history of colonic polyps   . Reactive airway disease   . Vitamin D deficiency    Past Surgical History:  Procedure Laterality Date  . BREAST REDUCTION SURGERY    . REDUCTION MAMMAPLASTY Bilateral 2002  . TOTAL ABDOMINAL HYSTERECTOMY  2003   Right Oophrectomy     No outpatient medications have been marked as taking for the 02/21/19 encounter (Appointment) with Antonieta Iba, MD.     Allergies:   Patient has no known allergies.   Social History   Tobacco Use  . Smoking status: Never Smoker  . Smokeless tobacco: Never Used  Substance Use Topics  . Alcohol use: Yes    Alcohol/week: 0.0 standard drinks  . Drug use: No     Current Outpatient Medications on File Prior to Visit  Medication Sig Dispense Refill  . albuterol (PROVENTIL HFA;VENTOLIN HFA) 108 (90 Base) MCG/ACT inhaler  Inhale 2 puffs into the lungs every 6 (six) hours as needed for wheezing or shortness of breath. 1 Inhaler 0  . esomeprazole (NEXIUM) 40 MG capsule Take 1 capsule (40 mg total) by mouth daily. 30 capsule 1  . metoprolol succinate (TOPROL-XL) 25 MG 24 hr tablet TAKE 1 TABLET(25 MG) BY MOUTH DAILY 90 tablet 0  . montelukast (SINGULAIR) 10 MG tablet Take 1 tablet (10 mg total) by mouth daily. 30 tablet 3  . Ocrelizumab (OCREVUS IV) Inject into the vein.    . valsartan-hydrochlorothiazide (DIOVAN-HCT) 320-25 MG tablet TAKE 1 TABLET BY MOUTH DAILY 90 tablet 0  . VITAMIN D, ERGOCALCIFEROL, PO Take by mouth.      No current facility-administered medications on file prior to visit.      Family Hx: The patient's family history includes Heart disease in her maternal grandmother; Hypertension in her mother; Stroke in her mother. There is no history of Breast cancer.  ROS:   Please see the history of present illness.    Review of Systems  Constitutional: Negative.   Respiratory: Positive for shortness of breath.   Cardiovascular: Negative.   Gastrointestinal: Negative.   Musculoskeletal: Negative.   Neurological: Negative.   Psychiatric/Behavioral: Negative.   All other systems reviewed and are negative.     Labs/Other Tests and Data Reviewed:    Recent Labs: 12/31/2018: ALT 13; BUN 8; Creatinine, Ser 0.79; Hemoglobin 13.6; Platelets 328.0; Potassium 3.9; Sodium 141; TSH 1.39   Recent Lipid Panel Lab Results  Component Value Date/Time   CHOL 154 12/31/2018 10:13 AM   TRIG 104.0 12/31/2018 10:13 AM   HDL 49.60 12/31/2018 10:13 AM   CHOLHDL 3 12/31/2018 10:13 AM   LDLCALC 84 12/31/2018 10:13 AM    Wt Readings from Last 3 Encounters:  02/10/19 221 lb 12.8 oz (100.6 kg)  01/15/19 224 lb 12.8 oz (102 kg)  12/31/18 226 lb 12.8 oz (102.9 kg)     Exam:    Vital Signs: Vital signs as detailed above in HPI Typically blood pressure 120 systolic   Well nourished, well developed female in no acute distress. Constitutional:  oriented to person, place, and time. No distress.  Head: Normocephalic and atraumatic.  Eyes:  no discharge. No scleral icterus.  Neck: Normal range of motion. Neck supple.  Pulmonary/Chest: No audible wheezing, no distress, appears comfortable Musculoskeletal: Normal range of motion.  no  tenderness or deformity.  Neurological:   Coordination normal. Full exam not performed Skin:  No rash Psychiatric:  normal mood and affect. behavior is normal. Thought content normal.    ASSESSMENT & PLAN:     Shortness of breath Seemed to present following flu February 2020  She has since recovered At that time she did have an abnormal EKG with diffuse T wave inversions different from prior EKG in 2017 Findings discussed with her in detail, etiology unclear Unclear if this EKG abnormality has resolved since then as she is starting to feel better Certainly in the differential we should include pericarditis, myopericarditis in the setting of the flu Could consider underlying ischemia though she is feeling well with few risk factors but there is strong family history -Long discussion with her concerning various treatment options, We have recommended EKG once virus outbreak has resolved If abnormal EKG would proceed with echocardiogram If normal EKG will defer to her whether she would like echocardiogram  Abnormal EKG Repeat EKG ordered as above.  This can be done after viral outbreak as she feels well Given  family history of coronary disease also suggested if she has persistent abnormal EKG she could do echocardiogram and even CT coronary calcium score for risk stratification  Family history of coronary artery disease She has very few risk factors with no diabetes no smoking and cholesterol is excellent She is very concerned about her family history of atherosclerosis Suggested she consider CT coronary calcium scoring at a later date  Essential hypertension Reports blood pressure relatively well controlled No changes made   COVID-19 Education: The signs and symptoms of COVID-19 were discussed with the patient and how to seek care for testing (follow up with PCP or arrange E-visit).  The importance of social distancing was discussed today.  Patient Risk:   After full review of this patients clinical status, I feel that they are at least moderate risk at this time.  Time:   Today, I have spent 25 minutes with the patient with telehealth technology discussing abnormal EKG, shortness of breath differential, viral contributors to myopericarditis, family history  of coronary disease, screening testing, role of echocardiography with shortness of breath.     Medication Adjustments/Labs and Tests Ordered: Current medicines are reviewed at length with the patient today.  Concerns regarding medicines are outlined above.   Tests Ordered: No tests ordered   Medication Changes: No changes made   Disposition: Follow-up as needed   Signed, Julien Nordmann, MD  02/20/2019 10:02 PM    [facility] [address], [place], [place] [address]

## 2019-02-21 ENCOUNTER — Telehealth (INDEPENDENT_AMBULATORY_CARE_PROVIDER_SITE_OTHER): Payer: BC Managed Care – PPO | Admitting: Cardiovascular Disease

## 2019-02-21 ENCOUNTER — Other Ambulatory Visit: Payer: Self-pay

## 2019-02-21 DIAGNOSIS — Z8249 Family history of ischemic heart disease and other diseases of the circulatory system: Secondary | ICD-10-CM | POA: Diagnosis not present

## 2019-02-21 DIAGNOSIS — I10 Essential (primary) hypertension: Secondary | ICD-10-CM

## 2019-02-21 DIAGNOSIS — G35 Multiple sclerosis: Secondary | ICD-10-CM

## 2019-02-21 DIAGNOSIS — R0602 Shortness of breath: Secondary | ICD-10-CM | POA: Diagnosis not present

## 2019-02-21 DIAGNOSIS — R9431 Abnormal electrocardiogram [ECG] [EKG]: Secondary | ICD-10-CM

## 2019-02-21 NOTE — Patient Instructions (Addendum)
  After virus outbreak has resolved Please call for EKG in our office, previous EKG done through primary care with diffuse T wave inversions   Echocardiogram offered for shortness of breath back in February and in the setting of abnormal EKG She will call if she would like to have this ordered  Also please call at your convenience for CT coronary calcium scoring given strong family history of coronary artery disease - Please call [phone] to schedule this at your convenience.  - The cost is $150. - This is done at our [facility].    Medication Instructions:  No changes  If you need a refill on your cardiac medications before your next appointment, please call your pharmacy.    Lab work: No new labs needed   If you have labs (blood work) drawn today and your tests are completely normal, you will receive your results only by: Marland Kitchen MyChart Message (if you have MyChart) OR . A paper copy in the mail If you have any lab test that is abnormal or we need to change your treatment, we will call you to review the results.   Testing/Procedures: No new testing needed   Follow-Up: At [facility], you and your health needs are our priority.  As part of our continuing mission to provide you with exceptional heart care, we have created designated Provider Care Teams.  These Care Teams include your primary Cardiologist (physician) and Advanced Practice Providers (APPs -  Physician Assistants and Nurse Practitioners) who all work together to provide you with the care you need, when you need it.  . You will need a follow up appointment as needed .   Marland Kitchen Providers on your designated Care Team:   . Nicolasa Ducking, NP . Eula Listen, PA-C . Marisue Ivan, PA-C  Any Other Special Instructions Will Be Listed Below (If Applicable).  For educational health videos Log in to : [email or web address] Or : [email or web address], password : [email or web address]

## 2019-02-26 ENCOUNTER — Other Ambulatory Visit: Payer: Self-pay | Admitting: Internal Medicine

## 2019-02-26 ENCOUNTER — Encounter: Payer: Self-pay | Admitting: Internal Medicine

## 2019-02-26 ENCOUNTER — Other Ambulatory Visit: Payer: Self-pay

## 2019-02-26 MED ORDER — ESOMEPRAZOLE MAGNESIUM 40 MG PO CPDR: 40.0000 mg | DELAYED_RELEASE_CAPSULE | Freq: Every day | ORAL | 1 refills | Status: DC

## 2019-02-26 MED ORDER — VALSARTAN-HYDROCHLOROTHIAZIDE 320-25 MG PO TABS: 1.0000 | ORAL_TABLET | Freq: Every day | ORAL | 0 refills | Status: DC

## 2019-03-17 ENCOUNTER — Ambulatory Visit: Payer: BC Managed Care – PPO | Admitting: Cardiovascular Disease

## 2019-04-17 ENCOUNTER — Other Ambulatory Visit: Payer: Self-pay

## 2019-04-17 DIAGNOSIS — R0602 Shortness of breath: Secondary | ICD-10-CM

## 2019-04-17 DIAGNOSIS — R9431 Abnormal electrocardiogram [ECG] [EKG]: Secondary | ICD-10-CM

## 2019-04-17 NOTE — Progress Notes (Signed)
g

## 2019-04-23 ENCOUNTER — Telehealth: Payer: Self-pay | Admitting: Pulmonary Disease

## 2019-04-23 NOTE — Telephone Encounter (Signed)
Nothing further needed 

## 2019-04-23 NOTE — Telephone Encounter (Signed)
Pt had a missed call and would like a call back 

## 2019-04-23 NOTE — Telephone Encounter (Signed)
Appt. Updated. Nothing further needed.  °

## 2019-04-23 NOTE — Telephone Encounter (Signed)
Called pt to reschedule 04/28/2019 appt. To 05/02/2019.

## 2019-04-23 NOTE — Telephone Encounter (Signed)
Called patient for COVID-19 pre-screening for in office visit.  Have you recently traveled any where out of the local area in the last 2 weeks? No  Have you been in close contact with a person diagnosed with COVID-19 within the last 2 weeks? No  Do you currently have any of the following symptoms? If so, when did they start? Cough     Diarrhea   Joint Pain Fever      Muscle Pain   Red eyes Shortness of breath   Abdominal pain  Vomiting Loss of smell    Rash    Sore Throat Headache    Weakness   Bruising or bleeding  No other symptoms just chest congestion that she's had for a "while", per pt.  Okay to proceed with visit. (date)  / Needs to reschedule visit. (date)

## 2019-04-23 NOTE — Telephone Encounter (Signed)
Spoke to pt, who stated that chest congestion has been present x94mo.  Per Dr. Jayme Cloud, okay to proceed with appt.

## 2019-04-23 NOTE — Telephone Encounter (Addendum)
Lm to verify length of sx.

## 2019-04-24 ENCOUNTER — Ambulatory Visit: Payer: BC Managed Care – PPO | Admitting: Pulmonary Disease

## 2019-04-24 ENCOUNTER — Ambulatory Visit (INDEPENDENT_AMBULATORY_CARE_PROVIDER_SITE_OTHER): Payer: BC Managed Care – PPO

## 2019-04-24 ENCOUNTER — Encounter: Payer: Self-pay | Admitting: Pulmonary Disease

## 2019-04-24 ENCOUNTER — Other Ambulatory Visit: Payer: Self-pay

## 2019-04-24 VITALS — BP 118/80 | HR 60 | Temp 97.0°F | Ht 67.0 in | Wt 221.2 lb

## 2019-04-24 DIAGNOSIS — R0600 Dyspnea, unspecified: Secondary | ICD-10-CM

## 2019-04-24 DIAGNOSIS — J3089 Other allergic rhinitis: Secondary | ICD-10-CM

## 2019-04-24 DIAGNOSIS — R059 Cough, unspecified: Secondary | ICD-10-CM

## 2019-04-24 DIAGNOSIS — R05 Cough: Secondary | ICD-10-CM | POA: Diagnosis not present

## 2019-04-24 DIAGNOSIS — K219 Gastro-esophageal reflux disease without esophagitis: Secondary | ICD-10-CM | POA: Diagnosis not present

## 2019-04-24 DIAGNOSIS — G35 Multiple sclerosis: Secondary | ICD-10-CM

## 2019-04-24 MED ORDER — BECLOMETHASONE DIPROP HFA 80 MCG/ACT IN AERB: 2.0000 | INHALATION_SPRAY | Freq: Two times a day (BID) | RESPIRATORY_TRACT | 0 refills | Status: DC

## 2019-04-24 NOTE — Progress Notes (Signed)
Subjective:    Patient ID: Jackie White, female    DOB: [birth year], 63 y.o.   MRN: [id number]  HPI  Is a 63 year old lifelong never smoker, who presents for follow-up of cough.  The patient notes that in around July 2019 she developed a cough that was mostly nonproductive.  When it is productive it is usually whitish sputum that she produces.  She has not had any hemoptysis.  She noted that she had increasing issues with postnasal drip and was initially treated with Nasacort and prednisone.  She notes that these interventions helped however she has not been as consistent with using the Nasacort.  She notes persistent postnasal drip even when she uses the Nasacort and sometimes this gives her a globus sensation.  She was given also on albuterol inhaler however she notes that this may help at times but other times does not.  She states that when she lays down at night she notices a "rattling" and wheezing sleep on the right side of her chest.  She has not had any fevers, chills or sweats.  She has not had any orthopnea or paroxysmal nocturnal dyspnea per se.  No lower extremity edema.  He continues to have issues with nasal congestion and postnasal drip.  She was tried on Singulair however this has not helped.  She also recently was noted to have an abnormal EKG that showed T-segment abnormality.  She does have bradycardia.  The patient does have issues with gastroesophageal reflux previously controlled with Nexium but now having some breakthrough symptoms in the evening.  He has started to notice some dyspnea on exertion particularly towards evening time.  She is concerned about congestive heart failure.   Review of Systems  Constitutional: Negative.   HENT: Positive for congestion and postnasal drip.        Globus sensation  Eyes: Negative.   Respiratory: Positive for cough, choking, shortness of breath and wheezing. Negative for chest tightness and stridor.   Cardiovascular: Negative.    Gastrointestinal:       Gastroesophageal reflux symptoms  Endocrine: Negative.   Genitourinary: Negative.   Musculoskeletal: Negative.   Skin: Negative.   Allergic/Immunologic: Positive for environmental allergies.  Neurological: Negative.   Hematological: Negative.   Psychiatric/Behavioral: Negative.   All other systems reviewed and are negative.      Objective:   Physical Exam Constitutional:      General: She is not in acute distress.    Appearance: She is well-developed. She is obese. She is not ill-appearing.  HENT:     Head: Normocephalic and atraumatic.     Nose:     Comments: Unable to examine nose mouth and throat due to masking requirements during COVID-19. Eyes:     Extraocular Movements: Extraocular movements intact.     Pupils: Pupils are equal, round, and reactive to light.  Neck:     Musculoskeletal: Neck supple.     Thyroid: No thyromegaly.     Vascular: No JVD.     Trachea: No tracheal deviation.  Cardiovascular:     Rate and Rhythm: Normal rate and regular rhythm.     Pulses: Normal pulses.     Heart sounds: Normal heart sounds.     Comments: Reviewed 2 EKG leads with EKO stethoscope and no ST abnormality was noted. Pulmonary:     Effort: Pulmonary effort is normal.     Breath sounds: Normal breath sounds.  Abdominal:     General: Abdomen is protuberant. There  is no distension.  Musculoskeletal: Normal range of motion.     Right lower leg: No edema.     Left lower leg: No edema.  Lymphadenopathy:     Cervical: No cervical adenopathy.  Skin:    General: Skin is warm and dry.  Neurological:     General: No focal deficit present.     Mental Status: She is alert and oriented to person, place, and time.  Psychiatric:        Mood and Affect: Mood normal.        Behavior: Behavior normal.         Assessment & Plan:   1.  Dyspnea: Nothing to suggest pulmonary etiology.  She has had normal EKGs previously.  Will obtain 2D echo to evaluate for  potential cardiac causes of dyspnea to include diastolic dysfunction and/or pulmonary hypertension.  2.  Cough: In non-smokers cough is usually related to upper airway cough syndrome previously noted as postnasal drip syndrome or gastroesophageal reflux/LPR issues.  See management as below.  We will give her a trial of Qvar in the event that some of her issues may be due to eosinophilic bronchitis.  In addition, the patient is on ocrelizumab, cough is a side effect of these types of medications.  The patient has been made aware of this and realizes that her symptom may persist despite all these treatments.  However, in her situation the benefits of the medication outweigh minor side effects.  Since we are giving her a trial of Qvar I will see her in follow-up in 4 to 6 weeks time, she is to contact us prior to that time should any new difficulties arise.  3.  Perennial allergic rhinitis: She did not have overtly elevated IgE.  She does appear to have some minor sensitivities to grasses and dust mites.  She did not find Singulair very helpful.  Will discontinue this.  Recommend Allegra OR Claritin continue nasal hygiene.  Continue Nasacort.  4.  Laryngopharyngeal reflux: Patient has had some breakthrough symptoms gastroesophageal reflux which trigger LPR, this despite use of Nexium.  Will add Pepcid AC at bedtime.  Patient was instructed to get over-the-counter.  5.  Multiple Sclerosis: This issue adds complexity to her management.    This chart was dictated using voice recognition software/Dragon.  Despite best efforts to proofread, errors can occur which can change the meaning.  Any change was purely unintentional.

## 2019-04-24 NOTE — Patient Instructions (Signed)
1.  You have been scheduled for an echocardiogram this will be done today.  2.  Your cough is likely related to airway inflammation caused by postnasal drip and perhaps some reflux issues.  Your medication utilized for MS also can cause the side effect.  However your medication is very important to treat your MS.  3.  Take Allegra or Claritin at bedtime along with a Pepcid AC.  You can get these over-the-counter.  4.  Stop taking Singulair as it has not been effective.  5.  We will give you a trial of an inhaler called Qvar use 2 puffs twice a day, rinse your mouth well after use.  This hopefully will reduce airway inflammation.  6.  We will see you in follow-up in 4 to 6 weeks time call us prior to that should any new difficulties arise.

## 2019-04-25 LAB — ECHOCARDIOGRAM COMPLETE
Height: 67 in
Weight: 3539.2 oz

## 2019-04-28 ENCOUNTER — Ambulatory Visit: Payer: BC Managed Care – PPO | Admitting: Pulmonary Disease

## 2019-04-28 ENCOUNTER — Telehealth: Payer: Self-pay

## 2019-04-28 ENCOUNTER — Ambulatory Visit
Admission: RE | Admit: 2019-04-28 | Discharge: 2019-04-28 | Disposition: A | Discharge status: Home / Self Care | Payer: BC Managed Care – PPO | Source: Ambulatory Visit | Attending: Internal Medicine | Admitting: Internal Medicine

## 2019-04-28 ENCOUNTER — Other Ambulatory Visit: Payer: Self-pay

## 2019-04-28 DIAGNOSIS — R0602 Shortness of breath: Secondary | ICD-10-CM | POA: Insufficient documentation

## 2019-04-28 DIAGNOSIS — R9431 Abnormal electrocardiogram [ECG] [EKG]: Secondary | ICD-10-CM

## 2019-04-28 NOTE — Telephone Encounter (Signed)
LM for patient to call for results.

## 2019-04-28 NOTE — Telephone Encounter (Signed)
-----   Message from Tyler Pita, MD sent at 04/28/2019  9:09 AM EDT ----- Mild diastolic dysfunction (stiff heart) but otherwise OK

## 2019-04-28 NOTE — Telephone Encounter (Signed)
Spoke to pt and relayed below results. Nothing further is needed.

## 2019-05-20 ENCOUNTER — Other Ambulatory Visit: Payer: Self-pay | Admitting: Internal Medicine

## 2019-05-20 ENCOUNTER — Telehealth: Payer: Self-pay | Admitting: *Deleted

## 2019-05-20 NOTE — Telephone Encounter (Signed)

## 2019-05-21 ENCOUNTER — Ambulatory Visit (INDEPENDENT_AMBULATORY_CARE_PROVIDER_SITE_OTHER)
Admission: RE | Admit: 2019-05-21 | Discharge: 2019-05-21 | Disposition: A | Discharge status: Home / Self Care | Payer: Self-pay | Source: Ambulatory Visit | Attending: Cardiovascular Disease | Admitting: Cardiovascular Disease

## 2019-05-21 ENCOUNTER — Other Ambulatory Visit: Payer: Self-pay

## 2019-05-21 DIAGNOSIS — Z8249 Family history of ischemic heart disease and other diseases of the circulatory system: Secondary | ICD-10-CM

## 2019-05-22 MED ORDER — METOPROLOL SUCCINATE ER 25 MG PO TB24: ORAL_TABLET | ORAL | 0 refills | Status: DC

## 2019-05-22 MED ORDER — VALSARTAN-HYDROCHLOROTHIAZIDE 320-25 MG PO TABS: 1.0000 | ORAL_TABLET | Freq: Every day | ORAL | 0 refills | Status: DC

## 2019-05-28 ENCOUNTER — Ambulatory Visit (INDEPENDENT_AMBULATORY_CARE_PROVIDER_SITE_OTHER): Payer: BC Managed Care – PPO | Admitting: Pulmonary Disease

## 2019-05-28 ENCOUNTER — Encounter: Payer: Self-pay | Admitting: Pulmonary Disease

## 2019-05-28 DIAGNOSIS — R05 Cough: Secondary | ICD-10-CM

## 2019-05-28 DIAGNOSIS — I5189 Other ill-defined heart diseases: Secondary | ICD-10-CM | POA: Diagnosis not present

## 2019-05-28 DIAGNOSIS — R059 Cough, unspecified: Secondary | ICD-10-CM

## 2019-05-28 DIAGNOSIS — J324 Chronic pansinusitis: Secondary | ICD-10-CM | POA: Diagnosis not present

## 2019-05-28 DIAGNOSIS — G35 Multiple sclerosis: Secondary | ICD-10-CM | POA: Diagnosis not present

## 2019-05-28 NOTE — Progress Notes (Signed)
Subjective:    Patient ID: Jackie White, female    DOB: February 06, 1956, 63 y.o.   MRN: [id number]  Virtual Visit Via Video or Telephone Note:   This visit type was conducted due to national recommendations for restrictions regarding the COVID-19 pandemic.  This format is felt to be most appropriate for this patient at this time.  All issues noted in this document were discussed and addressed.  No physical exam was performed (except for noted visual exam findings with Video Visits).    I connected with Jackie White by telephone at 09;06 hours and verified that I am speaking with the correct person using two identifiers. Location patient: home Location provider: [facility] Persons participating in the virtual visit: patient, provider   I discussed the limitations, risks, security and privacy concerns of performing an evaluation and management service by video and the availability of in person appointments. The patient expressed understanding and agreed to proceed. HPI Patient is a 63 year old lifelong never smoker whom we are connecting with for discussion of follow-up of symptoms of cough and upper respiratory congestion which occurs mostly at nighttime.  Symptoms first appeared in or around July 2019 (possibly earlier).  She notes increasing issues with postnasal drip and has been treated with prednisone, nasal sprays and antihistamines.  None of these issues have improved the symptoms.  She notices that when she lays down at night she has a "rattling" and wheezing which she notes occurs around the neck area when she lays down.  This is consistent with retained mucus.  She has not developed any issues with fevers chills or sweats.  No sore throat.  No anosmia.  No orthopnea or paroxysmal nocturnal dyspnea.  She tried Singulair in the past without relief.  She is currently on Claritin without improvement.  She is to undergo cardiac evaluation for an abnormal EKG.  She has had a  2D echo that is consistent with possible diastolic dysfunction.  Cardiac CT which was negative.  Of note the lung windows on her cardiac CT performed on 1 July, were normal.  Her allergy testing by [facility] was essentially negative.  She has not been evaluated by ENT.  As noted most of her symptoms appear to be upper airway related.  She was tried on Qvar however this is not helped her at all she feels that albuterol helps her most when her symptoms occur as it helps her clear secretions.  Of note she has been on Ocrevus since August 2017.  Review of Systems  Constitutional: Negative.   HENT: Positive for congestion, postnasal drip and sinus pressure.   Eyes: Negative.   Respiratory: Positive for cough and wheezing (Relates mostly in the neck area at nighttime associated with increased mucus).   Cardiovascular: Negative.   Gastrointestinal: Negative.   Endocrine: Negative.   Genitourinary: Negative.   Musculoskeletal: Negative.   Skin: Negative.   Neurological: Positive for numbness (Left side, chronic due to MS).  Hematological: Negative.   Psychiatric/Behavioral: Negative.   All other systems reviewed and are negative.      Objective:   Physical Exam  No physical exam was performed as this was a telephone visit. Patient has fluent speech with no hoarseness noted.      Assessment & Plan:   1.  Persistent cough, upper airway congestion: She has failed treatment with multiple modalities for this.  Will obtain CT scan of the sinuses to evaluate for potential chronic sinusitis responsible for the symptoms.  There  is an association with chronic sinusitis and MS and there is also the possibility that her symptoms are related to side effects from Merna.  Her pulmonary function was assessed with spirometry on her initial visit and this was normal.  There is no evidence of lung parenchymal abnormality on the cardiac CT performed on 1 July.  For now continue with nasal hygiene, and Claritin as  well as nasal steroid spray.  She may need evaluation by ENT.  See the patient in follow-up in 3 to 4 weeks time.  Qvar will be discontinued as the patient has not been consistent in using it due to the side effect of dry mouth.  Continue albuterol as needed.  She feels that this helps with mucociliary clearance.  2.  Possible chronic pansinusitis: CT scan of the sinuses as above.  3.  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  The benefits of the Ocrevus however outweigh these mostly nuisance side effects.  4.  Diastolic dysfunction: We discussed the echocardiogram results.  As noted she also recently had a cardiac CT.  She is to be evaluated by Dr. Rockey Situ for these issues.   Total visit time: 20 minutes  This chart was dictated using voice recognition software/Dragon.  Despite best efforts to proofread, errors can occur which can change the meaning.  Any change was purely unintentional.

## 2019-05-31 NOTE — Progress Notes (Signed)
Cardiology Office Note  Date:  06/02/2019   ID:  [name], DOB [birth year], MRN [id number]  PCP:  Einar Pheasant, MD   Chief Complaint  Patient presents with  . Other    Patient denies chest pain. Patient c/o Some SOB and swelling. meds reviewed verbally with patient.     HPI:  [name] is a 63 year old woman with past medical history of MS Anemia Chronic cough, better on singulair GERD Who presents for follow-up of her SOB (seen on 12/31/2018), leg swelling  Periodic leg swelling,  Sitting for prolonged periods of time at work, on her feet all day, Case manager at [employer] She is concerned about having amyloid, based on family history  CT coronary calcium scoring of 0 Discussed with her in detail  Long discussion concerning echo findings below, diastolic dysfunction, elevated right heart pressures She does not want another diuretic such as Lasix  Echo 04/24/2019,  1. The left ventricle has normal systolic function with an ejection fraction of 60-65%. The cavity size was normal. Left ventricular diastolic Doppler parameters are consistent with pseudonormalization.  2. The right ventricle has normal systolic function. The cavity was normal. There is no increase in right ventricular wall thickness. Right ventricular systolic pressure is mildly elevated with an estimated pressure of 41.9 mmHg.  3. Left atrial size was mildly dilated.  4. Tricuspid valve regurgitation is moderate.  No EKG on today's visit EKG April 28, 2019 showed no T wave abnormality, essentially normal EKG  Other past medical history reviewed  Back in February she developed Temperature 101.1 pulse rate of 78 blood pressure 118/72 respiration 16 O2 sats 100%. Flu sx on 12/24/18 Seen in urgent care Treated with tamiflu  Seen shortly after by primary care 1 week later  SOB when getting over the flu Thought it was just congestion Now resolved Feels back to normal  EKG performed December 31, 2018:  NSR with diffuse T wave inversion V3 through V6, 3 and aVF -----Repeat EKG April 28, 2019 showed no T wave abnormality, essentially normal EKG  No diabetes, non smoker HCT 41 Total chol 154 CXR 11/2018: normal  Infusion  Q6 months for MS Disease has been relatively well controlled past 2 years  Family hx Aunt has amyloid CAD  PMH:   has a past medical history of Abnormal Pap smear, Cervical disc disease, GERD (gastroesophageal reflux disease), History of iron deficiency, Hypertension, Multiple sclerosis ([place]), Personal history of colonic polyps, Reactive airway disease, and Vitamin D deficiency.  PSH:    Past Surgical History:  Procedure Laterality Date  . BREAST REDUCTION SURGERY    . REDUCTION MAMMAPLASTY Bilateral 2002  . TOTAL ABDOMINAL HYSTERECTOMY  2003   Right Oophrectomy    Current Outpatient Medications  Medication Sig Dispense Refill  . albuterol (PROVENTIL HFA;VENTOLIN HFA) 108 (90 Base) MCG/ACT inhaler Inhale 2 puffs into the lungs every 6 (six) hours as needed for wheezing or shortness of breath. 1 Inhaler 0  . esomeprazole (NEXIUM) 40 MG capsule Take 1 capsule (40 mg total) by mouth daily. 90 capsule 1  . metoprolol succinate (TOPROL-XL) 25 MG 24 hr tablet TAKE 1 TABLET(25 MG) BY MOUTH DAILY 90 tablet 0  . Ocrelizumab (OCREVUS IV) Inject into the vein.    . valsartan-hydrochlorothiazide (DIOVAN-HCT) 320-25 MG tablet Take 1 tablet by mouth daily. 90 tablet 0  . VITAMIN D, ERGOCALCIFEROL, PO Take by mouth.     No current facility-administered medications for this visit.  Allergies:   Patient has no known allergies.   Social History:  The patient  reports that she has never smoked. She has never used smokeless tobacco. She reports current alcohol use. She reports that she does not use drugs.   Family History:   family history includes Heart disease in her maternal grandmother; Hypertension in her mother; Stroke in her mother.    Review of Systems: Review of  Systems  Constitutional: Negative.   HENT: Negative.   Respiratory: Positive for shortness of breath.   Cardiovascular: Positive for leg swelling.  Gastrointestinal: Negative.   Musculoskeletal: Negative.   Neurological: Negative.   Psychiatric/Behavioral: Negative.   All other systems reviewed and are negative.   PHYSICAL EXAM: VS:  BP 130/82 (BP Location: Left Arm, Patient Position: Sitting, Cuff Size: Normal)   Pulse (!) 59   Ht 5' 7.5" (1.715 m)   Wt 218 lb (98.9 kg)   LMP 12/27/2000   BMI 33.64 kg/m  , BMI Body mass index is 33.64 kg/m. GEN: Well nourished, well developed, in no acute distress  HEENT: normal  Neck: no JVD, carotid bruits, or masses Cardiac: RRR; no murmurs, rubs, or gallops,no edema  Respiratory:  clear to auscultation bilaterally, normal work of breathing GI: soft, nontender, nondistended, + BS MS: no deformity or atrophy  Skin: warm and dry, no rash Neuro:  Strength and sensation are intact Psych: euthymic mood, full affect   Recent Labs: 12/31/2018: ALT 13; BUN 8; Creatinine, Ser 0.79; Hemoglobin 13.6; Platelets 328.0; Potassium 3.9; Sodium 141; TSH 1.39    Lipid Panel Lab Results  Component Value Date   CHOL 154 12/31/2018   HDL 49.60 12/31/2018   LDLCALC 84 12/31/2018   TRIG 104.0 12/31/2018      Wt Readings from Last 3 Encounters:  06/02/19 218 lb (98.9 kg)  04/24/19 221 lb 3.2 oz (100.3 kg)  02/10/19 221 lb 12.8 oz (100.6 kg)      ASSESSMENT AND PLAN:  Problem List Items Addressed This Visit      Cardiology Problems   Essential hypertension     Other   Multiple sclerosis (HCC)    Other Visit Diagnoses    SOB (shortness of breath)    -  Primary     Shortness of breath Suspect deconditioning, weight Unable to exclude mildly elevated right heart pressures, diastolic dysfunction She does not want Lasix at this time Blood pressure relatively well controlled  htn Blood pressure well controlled, no changes to her  medications were made  MS Has worked with physical therapy, has chronic leg weakness  Family history of amyloid We will research her imaging, find out if it is worth further testing Aunt with amyloid, not a first-degree relative   Disposition:   F/U  12 months  No orders of the defined types were placed in this encounter.    Signed, Dossie Arbour, M.D., Ph.D. 06/02/2019  [facility] [place], [place] [phone]

## 2019-06-02 ENCOUNTER — Encounter: Payer: Self-pay | Admitting: Cardiovascular Disease

## 2019-06-02 ENCOUNTER — Other Ambulatory Visit: Payer: Self-pay

## 2019-06-02 ENCOUNTER — Ambulatory Visit: Payer: BC Managed Care – PPO | Admitting: Cardiovascular Disease

## 2019-06-02 VITALS — BP 130/82 | HR 59 | Ht 67.5 in | Wt 218.0 lb

## 2019-06-02 DIAGNOSIS — G35 Multiple sclerosis: Secondary | ICD-10-CM

## 2019-06-02 DIAGNOSIS — I10 Essential (primary) hypertension: Secondary | ICD-10-CM

## 2019-06-02 DIAGNOSIS — R0602 Shortness of breath: Secondary | ICD-10-CM | POA: Diagnosis not present

## 2019-06-02 NOTE — Patient Instructions (Signed)

## 2019-06-13 ENCOUNTER — Ambulatory Visit: Payer: BC Managed Care – PPO

## 2019-06-15 ENCOUNTER — Other Ambulatory Visit: Payer: Self-pay | Admitting: Pulmonary Disease

## 2019-06-19 ENCOUNTER — Ambulatory Visit
Admission: RE | Admit: 2019-06-19 | Discharge: 2019-06-19 | Disposition: A | Discharge status: Home / Self Care | Payer: BC Managed Care – PPO | Source: Ambulatory Visit | Attending: Pulmonary Disease | Admitting: Pulmonary Disease

## 2019-06-19 ENCOUNTER — Other Ambulatory Visit: Payer: Self-pay

## 2019-06-19 ENCOUNTER — Ambulatory Visit: Payer: BC Managed Care – PPO | Admitting: Pulmonary Disease

## 2019-06-19 DIAGNOSIS — J324 Chronic pansinusitis: Secondary | ICD-10-CM | POA: Diagnosis present

## 2019-06-19 DIAGNOSIS — R059 Cough, unspecified: Secondary | ICD-10-CM

## 2019-06-19 DIAGNOSIS — G35 Multiple sclerosis: Secondary | ICD-10-CM | POA: Diagnosis present

## 2019-06-19 DIAGNOSIS — R05 Cough: Secondary | ICD-10-CM | POA: Diagnosis not present

## 2019-06-19 DIAGNOSIS — G35D Multiple sclerosis, unspecified: Secondary | ICD-10-CM

## 2019-06-26 ENCOUNTER — Ambulatory Visit (INDEPENDENT_AMBULATORY_CARE_PROVIDER_SITE_OTHER): Payer: BC Managed Care – PPO | Admitting: Pulmonary Disease

## 2019-06-26 ENCOUNTER — Encounter: Payer: Self-pay | Admitting: Pulmonary Disease

## 2019-06-26 DIAGNOSIS — G35 Multiple sclerosis: Secondary | ICD-10-CM

## 2019-06-26 DIAGNOSIS — K219 Gastro-esophageal reflux disease without esophagitis: Secondary | ICD-10-CM

## 2019-06-26 DIAGNOSIS — R059 Cough, unspecified: Secondary | ICD-10-CM

## 2019-06-26 DIAGNOSIS — J3089 Other allergic rhinitis: Secondary | ICD-10-CM | POA: Diagnosis not present

## 2019-06-26 DIAGNOSIS — R05 Cough: Secondary | ICD-10-CM

## 2019-06-26 DIAGNOSIS — E669 Obesity, unspecified: Secondary | ICD-10-CM

## 2019-06-26 MED ORDER — OLOPATADINE HCL 0.6 % NA SOLN: 2.0000 | Freq: Two times a day (BID) | NASAL | 3 refills | Status: DC

## 2019-06-26 NOTE — Patient Instructions (Signed)
1.  Will start Patanase nasal, 2 inhalations twice a day to each nostril.  2.  You may use Milta Deiters med nasal rinses as directed in the package.  Mix with distilled water.  3.  We will see her in follow-up in 2 months time call sooner should any new difficulties arise.

## 2019-06-26 NOTE — Progress Notes (Signed)
Subjective:    Patient ID: Jackie White, female    DOB: 09-08-1956, 63 y.o.   MRN: [id number]  Virtual Visit Via Video or Telephone Note:   This visit type was conducted due to national recommendations for restrictions regarding the COVID-19 pandemic .  This format is felt to be most appropriate for this patient at this time.  All issues noted in this document were discussed and addressed.  No physical exam was performed (except for noted visual exam findings with Video Visits).    I connected with Vladimir Faster by a video enabled telemedicine application or telephone at 09:50 hours and verified that I was speaking with the correct person using two identifiers. Location patient: home Location provider: [facility] Persons participating in the virtual visit: patient, physician   I discussed the limitations, risks, security and privacy concerns of performing an evaluation and management service by video and the availability of in person appointments. The patient expressed understanding and agreed to proceed.   HPI 63 year old lifelong never smoker with continued issues with cough.  Follow-up from prior via phone visit continues to have upper respiratory congestion and "rattling" in the chest that occurs at nighttime when she lays down.  Has had issues with continued postnasal drip.  At one of her initial visits she was given a trial of Qvar for potential eosinophilic bronchitis.  She states that this did not help her at all but that albuterol does help her.  She states albuterol helps her clear secretions.  She has been on Ocrevus for MS symptoms since August 2017.  Ocrevus has known issues with effects of cough, chest tightness, shortness of breath, and nasal congestion among others.  The side effect profile is not limited to just upper airway infections.  Previous spirometry performed on 15 January 2019 was entirely normal.  No evidence of obstruction.  No issues with  mid flows.  She had CT scan of the sinuses on 30 July without evidence of sinusitis.  Only some mucoperiosteal thickening.  Patient has undergone cardiac evaluation by Dr. Mariah Milling with no evidence of coronary artery disease.   Review of Systems  Constitutional: Negative.   HENT: Positive for congestion, postnasal drip and sinus pressure.   Eyes: Negative.   Respiratory: Positive for cough and wheezing (Relates mostly in the neck area at nighttime associated with increased mucus).   Cardiovascular: Negative.   Gastrointestinal: Negative.   Endocrine: Negative.   Genitourinary: Negative.   Musculoskeletal: Negative.   Skin: Negative.   Neurological: Positive for numbness (Left side, chronic due to MS).  Hematological: Negative.   Psychiatric/Behavioral: Negative.   All other systems reviewed and are negative.      Objective:   Physical Exam  Physical exam not performed as this was via telephone visit.      Assessment & Plan:  1.  Persistent cough, upper airway congestion: She has failed treatment with multiple modalities for this.  Will obtain CT scan of the sinuses to evaluate for potential chronic sinusitis responsible for the symptoms.  There is an association with chronic sinusitis and MS and there is also the possibility that her symptoms are related to side effects from Ocrevus.  Her pulmonary function was assessed with spirometry on her initial visit and this was normal.  There is no evidence of lung parenchymal abnormality on the cardiac CT performed on 1 July.  For now continue with nasal hygiene, and Claritin as well as nasal steroid spray.  She may need  evaluation by ENT.  See the patient in follow-up in 3 to 4 weeks time.  Qvar will be discontinued as the patient has not been consistent in using it due to the side effect of dry mouth.  Continue albuterol as needed.  She feels that this helps with mucociliary clearance.  2.  No evidence of chronic pansinusitis: CT scan of  the sinuses show no pansinusitis.  ENT evaluation recommended nasal hygiene.  I have recommended NeilMed nasal rinses, will switch the patient to Patanase nasal 2 inhalations twice a day to each nostril for her chronic nasal congestion however, she was advised that this may be related to the [place]mos.  3.  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  The benefits of the Ocrevus however outweigh these mostly nuisance side effects.  4.  Diastolic dysfunction: We discussed the echocardiogram results.  As noted she also recently had a cardiac CT.   he is being followed by Dr. Rockey Situ for these issues.   We would like to see the patient back in follow-up in 2 months time.  At that time he would have to be an in person visit for proper physical examination.   Total time of this visit 15 minutes.

## 2019-08-12 ENCOUNTER — Other Ambulatory Visit: Payer: Self-pay

## 2019-08-13 ENCOUNTER — Other Ambulatory Visit: Payer: Self-pay

## 2019-08-13 ENCOUNTER — Ambulatory Visit: Payer: BC Managed Care – PPO | Admitting: Internal Medicine

## 2019-08-13 ENCOUNTER — Ambulatory Visit (INDEPENDENT_AMBULATORY_CARE_PROVIDER_SITE_OTHER): Payer: BC Managed Care – PPO

## 2019-08-13 VITALS — BP 124/78 | HR 60 | Temp 97.5°F | Resp 16 | Wt 220.4 lb

## 2019-08-13 DIAGNOSIS — E559 Vitamin D deficiency, unspecified: Secondary | ICD-10-CM

## 2019-08-13 DIAGNOSIS — E7800 Pure hypercholesterolemia, unspecified: Secondary | ICD-10-CM | POA: Diagnosis not present

## 2019-08-13 DIAGNOSIS — R05 Cough: Secondary | ICD-10-CM

## 2019-08-13 DIAGNOSIS — R062 Wheezing: Secondary | ICD-10-CM | POA: Diagnosis not present

## 2019-08-13 DIAGNOSIS — D649 Anemia, unspecified: Secondary | ICD-10-CM

## 2019-08-13 DIAGNOSIS — Z1231 Encounter for screening mammogram for malignant neoplasm of breast: Secondary | ICD-10-CM | POA: Diagnosis not present

## 2019-08-13 DIAGNOSIS — I10 Essential (primary) hypertension: Secondary | ICD-10-CM | POA: Diagnosis not present

## 2019-08-13 DIAGNOSIS — K219 Gastro-esophageal reflux disease without esophagitis: Secondary | ICD-10-CM

## 2019-08-13 DIAGNOSIS — G35 Multiple sclerosis: Secondary | ICD-10-CM | POA: Diagnosis not present

## 2019-08-13 DIAGNOSIS — R059 Cough, unspecified: Secondary | ICD-10-CM

## 2019-08-13 LAB — CBC WITH DIFFERENTIAL/PLATELET
Basophils Absolute: 0 10*3/uL (ref 0.0–0.1)
Basophils Relative: 0.8 % (ref 0.0–3.0)
Eosinophils Absolute: 0.3 10*3/uL (ref 0.0–0.7)
Eosinophils Relative: 5.6 % — ABNORMAL HIGH (ref 0.0–5.0)
HCT: 42.5 % (ref 36.0–46.0)
Hemoglobin: 13.7 g/dL (ref 12.0–15.0)
Lymphocytes Relative: 32.1 % (ref 12.0–46.0)
Lymphs Abs: 1.8 10*3/uL (ref 0.7–4.0)
MCHC: 32.3 g/dL (ref 30.0–36.0)
MCV: 85.5 fl (ref 78.0–100.0)
Monocytes Absolute: 0.6 10*3/uL (ref 0.1–1.0)
Monocytes Relative: 10 % (ref 3.0–12.0)
Neutro Abs: 2.9 10*3/uL (ref 1.4–7.7)
Neutrophils Relative %: 51.5 % (ref 43.0–77.0)
Platelets: 284 10*3/uL (ref 150.0–400.0)
RBC: 4.98 Mil/uL (ref 3.87–5.11)
RDW: 14.5 % (ref 11.5–15.5)
WBC: 5.6 10*3/uL (ref 4.0–10.5)

## 2019-08-13 LAB — HEPATIC FUNCTION PANEL
ALT: 13 U/L (ref 0–35)
AST: 15 U/L (ref 0–37)
Albumin: 4.2 g/dL (ref 3.5–5.2)
Alkaline Phosphatase: 83 U/L (ref 39–117)
Bilirubin, Direct: 0 mg/dL (ref 0.0–0.3)
Total Bilirubin: 0.7 mg/dL (ref 0.2–1.2)
Total Protein: 6.9 g/dL (ref 6.0–8.3)

## 2019-08-13 LAB — BASIC METABOLIC PANEL
BUN: 12 mg/dL (ref 6–23)
CO2: 32 mEq/L (ref 19–32)
Calcium: 10.1 mg/dL (ref 8.4–10.5)
Chloride: 99 mEq/L (ref 96–112)
Creatinine, Ser: 0.76 mg/dL (ref 0.40–1.20)
GFR: 93.53 mL/min (ref >60.00)
Glucose, Bld: 92 mg/dL (ref 70–99)
Potassium: 3.9 mEq/L (ref 3.5–5.1)
Sodium: 138 mEq/L (ref 135–145)

## 2019-08-13 LAB — LIPID PANEL
Cholesterol: 180 mg/dL (ref 0–200)
HDL: 75.3 mg/dL (ref >39.00)
LDL Cholesterol: 85 mg/dL (ref 0–99)
NonHDL: 104.35
Total CHOL/HDL Ratio: 2
Triglycerides: 95 mg/dL (ref 0.0–149.0)
VLDL: 19 mg/dL (ref 0.0–40.0)

## 2019-08-13 LAB — VITAMIN D 25 HYDROXY (VIT D DEFICIENCY, FRACTURES): VITD: 44.08 ng/mL (ref 30.00–100.00)

## 2019-08-13 NOTE — Progress Notes (Signed)
Patient ID: Jackie White, female   DOB: January 27, 1956, 63 y.o.   MRN: [id number]   Subjective:    Patient ID: Jackie White, female    DOB: Sep 11, 1956, 63 y.o.   MRN: [id number]  HPI  Patient here for a scheduled follow up.  She reports persistent congestion/cough.  She has seen pulmonary.  Have treated acid reflux and allergies.  Pulmonary was questioning if related to her MS treatment and being more prone to respiratory infections.  Was instructed to use patanase and saline rinses.  She denies any fever.  No chest congest or sob.  No chest pain or tightness.  No acid reflux.  No abdominal pain.  Bowels moving.  Saw neurology.  They agreed with ENT evaluation, stating that while there is an increased incidence of URIs with ocrelizumab, it does not appear she has been having infections.  Discussed with pt and she is in agreement for ENT evaluation.  Has seen cardiology.  Had CT coronary calcium score - 0.  Last evaluated 06/02/19- stable.  Recommended f/u in one year.  PFTs normal.  On singulair.  Still some issues with her right knee.  Desires no further intervention at this time.     Past Medical History:  Diagnosis Date  . Abnormal Pap smear   . Cervical disc disease    Cervical and lumbar disc disease  . GERD (gastroesophageal reflux disease)   . History of iron deficiency   . Hypertension   . Multiple sclerosis ([place])   . Personal history of colonic polyps   . Reactive airway disease   . Vitamin D deficiency    Past Surgical History:  Procedure Laterality Date  . BREAST REDUCTION SURGERY    . REDUCTION MAMMAPLASTY Bilateral 2002  . TOTAL ABDOMINAL HYSTERECTOMY  2003   Right Oophrectomy   Family History  Problem Relation Age of Onset  . Stroke Mother   . Hypertension Mother   . Heart disease Maternal Grandmother   . Breast cancer Neg Hx    Social History   Socioeconomic History  . Marital status: Married    Spouse name: Not on file  . Number of children: Not on file  .  Years of education: Not on file  . Highest education level: Not on file  Occupational History  . Not on file  Social Needs  . Financial resource strain: Not on file  . Food insecurity    Worry: Not on file    Inability: Not on file  . Transportation needs    Medical: Not on file    Non-medical: Not on file  Tobacco Use  . Smoking status: Never Smoker  . Smokeless tobacco: Never Used  Substance and Sexual Activity  . Alcohol use: Yes    Alcohol/week: 0.0 standard drinks  . Drug use: No  . Sexual activity: Not on file  Lifestyle  . Physical activity    Days per week: Not on file    Minutes per session: Not on file  . Stress: Not on file  Relationships  . Social Herbalist on phone: Not on file    Gets together: Not on file    Attends religious service: Not on file    Active member of club or organization: Not on file    Attends meetings of clubs or organizations: Not on file    Relationship status: Not on file  Other Topics Concern  . Not on file  Social History Narrative  .  Not on file    Outpatient Encounter Medications as of 08/13/2019  Medication Sig  . albuterol (PROVENTIL HFA;VENTOLIN HFA) 108 (90 Base) MCG/ACT inhaler Inhale 2 puffs into the lungs every 6 (six) hours as needed for wheezing or shortness of breath.  . esomeprazole (NEXIUM) 40 MG capsule Take 1 capsule (40 mg total) by mouth daily.  . metoprolol succinate (TOPROL-XL) 25 MG 24 hr tablet TAKE 1 TABLET(25 MG) BY MOUTH DAILY  . Ocrelizumab (OCREVUS IV) Inject into the vein.  Marland Kitchen Olopatadine HCl 0.6 % SOLN Place 2 sprays into the nose 2 (two) times daily.  . valsartan-hydrochlorothiazide (DIOVAN-HCT) 320-25 MG tablet Take 1 tablet by mouth daily.  Marland Kitchen VITAMIN D, ERGOCALCIFEROL, PO Take by mouth.   No facility-administered encounter medications on file as of 08/13/2019.    Review of Systems  Constitutional: Negative for appetite change and unexpected weight change.  HENT: Positive for congestion.  Negative for sinus pressure.   Respiratory: Positive for cough. Negative for chest tightness and shortness of breath.   Cardiovascular: Negative for chest pain, palpitations and leg swelling.  Gastrointestinal: Negative for abdominal pain, diarrhea, nausea and vomiting.  Genitourinary: Negative for difficulty urinating and dysuria.  Musculoskeletal: Negative for joint swelling and myalgias.  Skin: Negative for color change and rash.  Neurological: Negative for dizziness, light-headedness and headaches.  Psychiatric/Behavioral: Negative for agitation and dysphoric mood.       Objective:    Physical Exam Constitutional:      General: She is not in acute distress.    Appearance: Normal appearance.  HENT:     Head: Normocephalic and atraumatic.     Right Ear: External ear normal.     Left Ear: External ear normal.  Eyes:     General: No scleral icterus.       Right eye: No discharge.        Left eye: No discharge.     Conjunctiva/sclera: Conjunctivae normal.  Neck:     Musculoskeletal: Neck supple. No muscular tenderness.     Thyroid: No thyromegaly.  Cardiovascular:     Rate and Rhythm: Normal rate and regular rhythm.  Pulmonary:     Effort: No respiratory distress.     Breath sounds: Normal breath sounds. No wheezing.  Abdominal:     General: Bowel sounds are normal.     Palpations: Abdomen is soft.     Tenderness: There is no abdominal tenderness.  Musculoskeletal:        General: No swelling or tenderness.  Lymphadenopathy:     Cervical: No cervical adenopathy.  Skin:    Findings: No erythema or rash.  Neurological:     Mental Status: She is alert.  Psychiatric:        Mood and Affect: Mood normal.        Behavior: Behavior normal.     BP 124/78   Pulse 60   Temp (!) 97.5 F (36.4 C)   Resp 16   Wt 220 lb 6.4 oz (100 kg)   LMP 12/27/2000   SpO2 98%   BMI 34.01 kg/m  Wt Readings from Last 3 Encounters:  08/13/19 220 lb 6.4 oz (100 kg)  06/02/19 218 lb  (98.9 kg)  04/24/19 221 lb 3.2 oz (100.3 kg)     Lab Results  Component Value Date   WBC 5.6 08/13/2019   HGB 13.7 08/13/2019   HCT 42.5 08/13/2019   PLT 284.0 08/13/2019   GLUCOSE 92 08/13/2019   CHOL 180 08/13/2019  TRIG 95.0 08/13/2019   HDL 75.30 08/13/2019   LDLCALC 85 08/13/2019   ALT 13 08/13/2019   AST 15 08/13/2019   NA 138 08/13/2019   K 3.9 08/13/2019   CL 99 08/13/2019   CREATININE 0.76 08/13/2019   BUN 12 08/13/2019   CO2 32 08/13/2019   TSH 1.39 12/31/2018    Ct Maxillofacial Wo Contrast  Result Date: 06/19/2019 CLINICAL DATA:  Recurrence sinus drainage and congestion for 1 year. EXAM: CT MAXILLOFACIAL WITHOUT CONTRAST TECHNIQUE: Multidetector CT imaging of the maxillofacial structures was performed. Multiplanar CT image reconstructions were also generated. COMPARISON:  MR brain January 17, 2016 FINDINGS: Osseous: No fracture or mandibular dislocation. No destructive process. Orbits: Negative. No traumatic or inflammatory finding. Sinuses: Minimal mucoperiosteal thickening is identified in ethmoid sinuses. The other visualized sinuses are clear. No air-fluid level is identified in the sinuses. The mastoid air cells are clear. Soft tissues: Negative. Limited intracranial: None. IMPRESSION: Minimal mucoperiosteal thickening of ethmoid sinuses. No evidence of sinusitis. Electronically Signed   By: Sherian Rein M.D.   On: 06/19/2019 14:45       Assessment & Plan:   Problem List Items Addressed This Visit    Anemia    Follow cbc.       Cough - Primary    Has tried treatment for acid reflux and various allergy treatments.  Saw pulmonary.  On singulair.  Still persistent congestion in throat and cough.  Agree with pulmonary and neurology, will refer to ENT for evaluation.  Recheck cxr.        Relevant Orders   Ambulatory referral to ENT   Essential hypertension    Blood pressure under good control.  Continue same medication regimen.  Follow pressures.  Follow  metabolic panel.        Relevant Orders   CBC with Differential/Platelet (Completed)   Basic metabolic panel (Completed)   GERD (gastroesophageal reflux disease)    Controlled on current regimen.        Hypercholesterolemia    Low cholesterol diet and exercise.  Follow lipid panel.        Relevant Orders   Hepatic function panel (Completed)   Lipid panel (Completed)   Multiple sclerosis (HCC)    On ocrelizumab.  Followed by neurology.  Stable.        Vitamin D deficiency    Follow vitamin D level.        Relevant Orders   VITAMIN D 25 Hydroxy (Vit-D Deficiency, Fractures) (Completed)   Wheezing   Relevant Orders   DG Chest 2 View (Completed)    Other Visit Diagnoses    Visit for screening mammogram       Relevant Orders   MM 3D SCREEN BREAST BILATERAL       Dale Italy, MD

## 2019-08-14 ENCOUNTER — Encounter: Payer: Self-pay | Admitting: Internal Medicine

## 2019-08-17 ENCOUNTER — Encounter: Payer: Self-pay | Admitting: Internal Medicine

## 2019-08-17 NOTE — Assessment & Plan Note (Signed)
Follow vitamin D level.  

## 2019-08-17 NOTE — Assessment & Plan Note (Signed)
On ocrelizumab.  Followed by neurology.  Stable.

## 2019-08-17 NOTE — Assessment & Plan Note (Signed)
Blood pressure under good control.  Continue same medication regimen.  Follow pressures.  Follow metabolic panel.   

## 2019-08-17 NOTE — Assessment & Plan Note (Signed)
Controlled on current regimen.   

## 2019-08-17 NOTE — Assessment & Plan Note (Signed)
Follow cbc.  

## 2019-08-17 NOTE — Assessment & Plan Note (Addendum)
Has tried treatment for acid reflux and various allergy treatments.  Saw pulmonary.  On singulair.  Still persistent congestion in throat and cough.  Agree with pulmonary and neurology, will refer to ENT for evaluation.  Recheck cxr.

## 2019-08-17 NOTE — Assessment & Plan Note (Signed)
Low cholesterol diet and exercise.  Follow lipid panel.   

## 2019-09-01 ENCOUNTER — Encounter: Payer: Self-pay | Admitting: Internal Medicine

## 2019-09-01 ENCOUNTER — Other Ambulatory Visit: Payer: Self-pay | Admitting: Internal Medicine

## 2019-09-03 ENCOUNTER — Encounter: Payer: Self-pay | Admitting: Internal Medicine

## 2019-09-03 MED ORDER — VALSARTAN-HYDROCHLOROTHIAZIDE 320-25 MG PO TABS: 1.0000 | ORAL_TABLET | Freq: Every day | ORAL | 1 refills | Status: DC

## 2019-09-03 MED ORDER — METOPROLOL SUCCINATE ER 25 MG PO TB24: ORAL_TABLET | ORAL | 1 refills | Status: DC

## 2019-09-04 ENCOUNTER — Other Ambulatory Visit: Payer: Self-pay

## 2019-09-04 MED ORDER — ESOMEPRAZOLE MAGNESIUM 40 MG PO CPDR: 40.0000 mg | DELAYED_RELEASE_CAPSULE | Freq: Every day | ORAL | 1 refills | Status: DC

## 2019-09-15 ENCOUNTER — Ambulatory Visit: Payer: BC Managed Care – PPO | Admitting: Pulmonary Disease

## 2019-09-15 ENCOUNTER — Other Ambulatory Visit: Payer: Self-pay

## 2019-09-15 VITALS — BP 132/84 | HR 67 | Temp 97.6°F | Ht 67.5 in | Wt 218.0 lb

## 2019-09-15 DIAGNOSIS — E669 Obesity, unspecified: Secondary | ICD-10-CM

## 2019-09-15 DIAGNOSIS — J683 Other acute and subacute respiratory conditions due to chemicals, gases, fumes and vapors: Secondary | ICD-10-CM | POA: Diagnosis not present

## 2019-09-15 DIAGNOSIS — R05 Cough: Secondary | ICD-10-CM

## 2019-09-15 DIAGNOSIS — J3089 Other allergic rhinitis: Secondary | ICD-10-CM

## 2019-09-15 DIAGNOSIS — E66812 Obesity, class 2: Secondary | ICD-10-CM

## 2019-09-15 DIAGNOSIS — G35 Multiple sclerosis: Secondary | ICD-10-CM | POA: Diagnosis not present

## 2019-09-15 DIAGNOSIS — R059 Cough, unspecified: Secondary | ICD-10-CM

## 2019-09-15 MED ORDER — BREO ELLIPTA 100-25 MCG/INH IN AEPB: 1.0000 | INHALATION_SPRAY | Freq: Every day | RESPIRATORY_TRACT | 11 refills | Status: DC

## 2019-09-15 MED ORDER — ALBUTEROL SULFATE HFA 108 (90 BASE) MCG/ACT IN AERS: 2.0000 | INHALATION_SPRAY | Freq: Four times a day (QID) | RESPIRATORY_TRACT | 6 refills | Status: DC | PRN

## 2019-09-15 NOTE — Patient Instructions (Signed)
1.  We will schedule breathing test.  This will expand on the breathing test you had in February.  2.  We will give you a trial of Breo Ellipta 1 inhalation daily.  3.  A refill for albuterol has been sent to your pharmacy.  4.  Let us see you in follow-up after the breathing test is performed.  Call sooner should any new difficulties arise.

## 2019-09-15 NOTE — Progress Notes (Signed)
Subjective:    Patient ID: Jackie White, female    DOB: February 01, 1956, 63 y.o.   MRN: [id number]  HPI 63 year old lifelong never smoker, with a history of MS on Ocrevus, presents for follow-up on the issue of cough and wheezing.  Her symptoms are mostly nocturnal.  She notes some relief with albuterol.  We first evaluated her for this issue in February 2020 with a totally normal spirometry at that time.  She has been evaluated by ENT that did not feel her issues were related to sinus disease.  She is currently on Patanase for chronic nasal congestion which she states has helped her.  She has been evaluated for diastolic dysfunction and is being followed by Dr. Mariah Milling no major coronary disease has been noted by cardiac CT.  The patient is somewhat distressed today because her symptoms have become worse particularly at nighttime.  Previously we had given her a trial of Qvar which she stated was not helpful this was with the working diagnosis of potential eosinophilic bronchitis versus cough variant asthma.  However the patient now tells me that she only used this medication twice which would not have been an adequate therapeutic trial.  She does note improvement with albuterol.  She has not had any fevers, chills or sweats.  She has not had any purulent sputum production.  When she does produce sputum it is mostly white.  As noted she does note "gurgling" and wheezing when she lays down at night.  Dr. Mariah Milling did note mild levator pressures related to diastolic dysfunction but at present the patient has declined diuretic.  She has not had formal PFTs due to the COVID-19 pandemic restrictions on these however we will schedule her now that these have resumed on a limited scale.   Review of Systems  Constitutional: Negative.   HENT: Positive for congestion, postnasal drip and sinus pressure.   Eyes: Negative.   Respiratory: Positive for cough and wheezing (Relates mostly in the neck area at nighttime  associated with increased mucus).   Cardiovascular: Negative.   Gastrointestinal: Negative.        No GERD symptoms.  Endocrine: Negative.   Genitourinary: Negative.   Musculoskeletal: Negative.   Skin: Negative.   Neurological: Positive for numbness (Left side, chronic due to MS).  Hematological: Negative.   Psychiatric/Behavioral: Negative.   All other systems reviewed and are negative.      Objective:   Physical Exam Constitutional:      General: She is not in acute distress.    Appearance: She is well-developed. She is obese. She is not ill-appearing.  HENT:     Head: Normocephalic and atraumatic.     Nose:     Comments: Unable to examine nose mouth and throat due to masking requirements during COVID-19. Eyes:     Extraocular Movements: Extraocular movements intact.     Pupils: Pupils are equal, round, and reactive to light.  Neck:     Musculoskeletal: Neck supple.     Thyroid: No thyromegaly.     Vascular: No JVD.     Trachea: No tracheal deviation.  Cardiovascular:     Rate and Rhythm: Normal rate and regular rhythm.     Pulses: Normal pulses.     Heart sounds: Normal heart sounds.  Pulmonary:     Effort: Pulmonary effort is normal.     Breath sounds: Normal air entry. Transmitted upper airway sounds present. Examination of the right-upper field reveals wheezing. Examination of the left-upper  field reveals wheezing. Wheezing present. No rhonchi or rales.  Abdominal:     General: Abdomen is protuberant. There is no distension.  Musculoskeletal: Normal range of motion.     Right lower leg: No edema.     Left lower leg: No edema.  Lymphadenopathy:     Cervical: No cervical adenopathy.  Skin:    General: Skin is warm and dry.  Neurological:     General: No focal deficit present.     Mental Status: She is alert and oriented to person, place, and time.  Psychiatric:        Mood and Affect: Mood normal.        Behavior: Behavior normal.        Assessment &  Plan:   1.  Cough: She continues to be bothered by the symptoms particularly at nighttime.  Today she did exhibit some bronchospasm/wheezing.  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  However, the patient only used this medication twice which was not an adequate trial.  Given that she does have some bronchospasm today will proceed with giving her a trial of Breo Ellipta 100/25, 1 inhalation daily.  She was shown the proper use of the dry powder inhaler.  She was asked to at the least give this medication 2 weeks time before discontinuing the trial.  She also was asked to notify us if she needs to discontinue the medication.  We will schedule formal PFTs now that these have resumed on a limited scale with the COVID-19 restrictions.  We will see her in follow-up after these are done. Ultimately though this may be related to [facility] (see [facility] monograph, [employer] monograph).  2.  Reactive airways disease versus cough variant asthma: PFTs as above.  Trial of Breo Ellipta as above.  The patient also received a refill on her albuterol inhaler.  3.  Perennial allergic rhinitis: This symptom may also be aggravated by Ocrevus.  She has however improved in this regard by the use of Patanase.  Continue Patanase and nasal hygiene with NeilMed nasal rinses.  4.  Multiple sclerosis: This issue adds complexity to her management.  As noted she is on Ocrevus.  5.  Obesity class II: Weight loss is recommended.  This chart was dictated using voice recognition software/Dragon.  Despite best efforts to proofread, errors can occur which can change the meaning.  Any change was purely unintentional.

## 2019-09-16 ENCOUNTER — Encounter: Payer: Self-pay | Admitting: Pulmonary Disease

## 2019-10-08 ENCOUNTER — Ambulatory Visit
Admission: RE | Admit: 2019-10-08 | Discharge: 2019-10-08 | Disposition: A | Discharge status: Home / Self Care | Payer: BC Managed Care – PPO | Source: Ambulatory Visit | Attending: Internal Medicine | Admitting: Internal Medicine

## 2019-10-08 DIAGNOSIS — Z1231 Encounter for screening mammogram for malignant neoplasm of breast: Secondary | ICD-10-CM | POA: Diagnosis present

## 2019-10-30 ENCOUNTER — Telehealth: Payer: Self-pay | Admitting: Pulmonary Disease

## 2019-10-30 ENCOUNTER — Other Ambulatory Visit: Payer: Self-pay

## 2019-10-30 NOTE — Telephone Encounter (Signed)
Pt is aware of date/time of covid test.   

## 2019-11-03 ENCOUNTER — Other Ambulatory Visit
Admission: RE | Admit: 2019-11-03 | Discharge: 2019-11-03 | Disposition: A | Discharge status: Home / Self Care | Payer: BC Managed Care – PPO | Source: Ambulatory Visit | Attending: Pulmonary Disease | Admitting: Pulmonary Disease

## 2019-11-03 ENCOUNTER — Other Ambulatory Visit: Payer: Self-pay

## 2019-11-03 DIAGNOSIS — Z20828 Contact with and (suspected) exposure to other viral communicable diseases: Secondary | ICD-10-CM | POA: Diagnosis not present

## 2019-11-03 DIAGNOSIS — Z01812 Encounter for preprocedural laboratory examination: Secondary | ICD-10-CM | POA: Insufficient documentation

## 2019-11-03 LAB — SARS CORONAVIRUS 2 (TAT 6-24 HRS): SARS Coronavirus 2: NEGATIVE

## 2019-11-04 ENCOUNTER — Other Ambulatory Visit: Payer: Self-pay

## 2019-11-04 ENCOUNTER — Ambulatory Visit: Payer: BC Managed Care – PPO | Attending: Pulmonary Disease

## 2019-11-04 DIAGNOSIS — R059 Cough, unspecified: Secondary | ICD-10-CM

## 2019-11-04 DIAGNOSIS — R05 Cough: Secondary | ICD-10-CM | POA: Diagnosis not present

## 2019-11-04 MED ORDER — ALBUTEROL SULFATE (2.5 MG/3ML) 0.083% IN NEBU
2.5000 mg | INHALATION_SOLUTION | Freq: Once | RESPIRATORY_TRACT | Status: AC
  Administered 2019-11-04: 2.5 mg via RESPIRATORY_TRACT
  Filled 2019-11-04: qty 3

## 2019-11-19 ENCOUNTER — Ambulatory Visit (INDEPENDENT_AMBULATORY_CARE_PROVIDER_SITE_OTHER): Payer: BC Managed Care – PPO | Admitting: Pulmonary Disease

## 2019-11-19 ENCOUNTER — Encounter: Payer: Self-pay | Admitting: Pulmonary Disease

## 2019-11-19 DIAGNOSIS — G35 Multiple sclerosis: Secondary | ICD-10-CM | POA: Diagnosis not present

## 2019-11-19 DIAGNOSIS — J3089 Other allergic rhinitis: Secondary | ICD-10-CM

## 2019-11-19 DIAGNOSIS — J45991 Cough variant asthma: Secondary | ICD-10-CM | POA: Diagnosis not present

## 2019-11-19 NOTE — Patient Instructions (Signed)
1.  You likely have cough variant asthma  2.  Continue Breo Ellipta  3.  Follow-up in 6 months time, call sooner should any new difficulties arise.

## 2019-11-19 NOTE — Progress Notes (Signed)
   Subjective:    Patient ID: Jackie White, female    DOB: 07-10-1956, 63 y.o.   MRN: [id number] Virtual Visit Via Video or Telephone Note:   This visit type was conducted due to national recommendations for restrictions regarding the COVID-19 pandemic .  This format is felt to be most appropriate for this patient at this time.  All issues noted in this document were discussed and addressed.  No physical exam was performed (except for noted visual exam findings with Video Visits).    I connected with Deborah Chalk by telephone at 9:30 AM and verified that I was speaking with the correct person using two identifiers. Location patient: home Location provider: [facility] Persons participating in the virtual visit: patient, physician   I discussed the limitations, risks, security and privacy concerns of performing an evaluation and management service by video and the availability of in person appointments. The patient expressed understanding and agreed to proceed.  HPI   63 year old lifelong never smoker, with a history of MS on Ocrevus, following up on the issue of cough and wheezing.  Her symptoms are mostly nocturnal.  She notes some relief with albuterol.  She had failed a Qvar trial previously, however she only took 2 doses of the medication.  During her last visit on 26 October, we started her on [place] after noticing bronchospasm during that visit.  She has noted that Adair Patter has helped her with regards to the cough and the nighttime congestion.  She did have pulmonary function testing performed on 15 December, these show an FEV1 of 2.14 L or 89% pred and postbronchodilator a net change of 7%.  On airways resistance there is mild to moderate increased airways resistance.  The technician noted that the maneuvers were affected by coughing these can sometimes exaggerate FEV1 numbers.  The patient notes that her symptoms are markedly improved and she feels she is  doing well with the [facility]  She has issues with perennial rhinitis controlled with Patanase.  No other symptoms are elicited.   Review of Systems A 10 point review of systems was performed and it is as noted above otherwise negative.    Objective:   Physical Exam  No physical exam performed as this was a telephone visit.  The patient however had fluent speech with no interruptions, no cough noted during conversation.      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 Derrill Kay, MD [facility]   This note was dictated using voice recognition software/Dragon.  Despite best efforts to proofread, errors can occur which can change the meaning.  Any change was purely unintentional.

## 2019-11-20 ENCOUNTER — Encounter: Payer: Self-pay | Admitting: Internal Medicine

## 2019-12-19 ENCOUNTER — Ambulatory Visit: Payer: BC Managed Care – PPO | Admitting: Internal Medicine

## 2019-12-31 ENCOUNTER — Encounter: Payer: Self-pay | Admitting: Internal Medicine

## 2019-12-31 ENCOUNTER — Other Ambulatory Visit: Payer: Self-pay

## 2019-12-31 ENCOUNTER — Ambulatory Visit (INDEPENDENT_AMBULATORY_CARE_PROVIDER_SITE_OTHER): Payer: BC Managed Care – PPO | Admitting: Internal Medicine

## 2019-12-31 DIAGNOSIS — D649 Anemia, unspecified: Secondary | ICD-10-CM

## 2019-12-31 DIAGNOSIS — I10 Essential (primary) hypertension: Secondary | ICD-10-CM

## 2019-12-31 DIAGNOSIS — E7800 Pure hypercholesterolemia, unspecified: Secondary | ICD-10-CM

## 2019-12-31 DIAGNOSIS — K219 Gastro-esophageal reflux disease without esophagitis: Secondary | ICD-10-CM | POA: Diagnosis not present

## 2019-12-31 DIAGNOSIS — G35 Multiple sclerosis: Secondary | ICD-10-CM

## 2019-12-31 DIAGNOSIS — R059 Cough, unspecified: Secondary | ICD-10-CM

## 2019-12-31 DIAGNOSIS — R05 Cough: Secondary | ICD-10-CM | POA: Diagnosis not present

## 2019-12-31 DIAGNOSIS — E559 Vitamin D deficiency, unspecified: Secondary | ICD-10-CM

## 2019-12-31 DIAGNOSIS — M25561 Pain in right knee: Secondary | ICD-10-CM

## 2019-12-31 NOTE — Telephone Encounter (Signed)
Already spoke with patient

## 2019-12-31 NOTE — Progress Notes (Signed)
Patient ID: Jackie White, female   DOB: [birth year], 64 y.o.   MRN: [id number]   Virtual Visit via video Note  This visit type was conducted due to national recommendations for restrictions regarding the COVID-19 pandemic (e.g. social distancing).  This format is felt to be most appropriate for this patient at this time.  All issues noted in this document were discussed and addressed.  No physical exam was performed (except for noted visual exam findings with Video Visits).   I connected with [facility] by a video enabled telemedicine application and verified that I am speaking with the correct person using two identifiers. Location patient: home Location provider: work  Persons participating in the virtual visit: patient, provider  The limitations, risks, security and privacy concerns of performing an evaluation and management service by video and the availability of in person appointments have been discussed.  The patient expressed understanding and agreed to proceed.   Reason for visit: scheduled follow up.   HPI: Hs been having pain in her right knee.  Seeing ortho.  Last evaluated 11/28/19.  Continue physical therapy and ice.  Continue f/u with ortho.  Tries to stay active.  No chest pain or sob reported.  No acid reflux.  No abdominal pain or bowel changes reported.  Has seen Dr Patsey Berthold for cough - felt to be cough variant asthma.  Using breo.  Breathing stable.  Seeing neurology for MS - ocrevus.  Handling stress.     ROS: See pertinent positives and negatives per HPI.  Past Medical History:  Diagnosis Date  . Abnormal Pap smear   . Cervical disc disease    Cervical and lumbar disc disease  . GERD (gastroesophageal reflux disease)   . History of iron deficiency   . Hypertension   . Multiple sclerosis ([place])   . Personal history of colonic polyps   . Reactive airway disease   . Vitamin D deficiency     Past Surgical History:  Procedure Laterality Date  . BREAST  REDUCTION SURGERY    . REDUCTION MAMMAPLASTY Bilateral 2002  . TOTAL ABDOMINAL HYSTERECTOMY  2003   Right Oophrectomy    Family History  Problem Relation Age of Onset  . Stroke Mother   . Hypertension Mother   . Heart disease Maternal Grandmother   . Breast cancer Neg Hx     SOCIAL HX: reviewed.    Current Outpatient Medications:  .  albuterol (VENTOLIN HFA) 108 (90 Base) MCG/ACT inhaler, Inhale 2 puffs into the lungs every 6 (six) hours as needed for wheezing or shortness of breath (cough)., Disp: 18 g, Rfl: 6 .  esomeprazole (NEXIUM) 40 MG capsule, Take 1 capsule (40 mg total) by mouth daily., Disp: 90 capsule, Rfl: 1 .  fluticasone furoate-vilanterol (BREO ELLIPTA) 100-25 MCG/INH AEPB, Inhale 1 puff into the lungs daily., Disp: 28 each, Rfl: 11 .  metoprolol succinate (TOPROL-XL) 25 MG 24 hr tablet, TAKE 1 TABLET(25 MG) BY MOUTH DAILY, Disp: 90 tablet, Rfl: 1 .  Ocrelizumab (OCREVUS IV), Inject into the vein., Disp: , Rfl:  .  Olopatadine HCl 0.6 % SOLN, Place 2 sprays into the nose 2 (two) times daily., Disp: 30.5 g, Rfl: 3 .  valsartan-hydrochlorothiazide (DIOVAN-HCT) 320-25 MG tablet, Take 1 tablet by mouth daily., Disp: 90 tablet, Rfl: 1 .  VITAMIN D, ERGOCALCIFEROL, PO, Take by mouth., Disp: , Rfl:   EXAM:  GENERAL: alert, oriented, appears well and in no acute distress  HEENT: atraumatic, conjunttiva clear, no obvious  abnormalities on inspection of external nose and ears  NECK: normal movements of the head and neck  LUNGS: on inspection no signs of respiratory distress, breathing rate appears normal, no obvious gross SOB, gasping or wheezing  CV: no obvious cyanosis  PSYCH/NEURO: pleasant and cooperative, no obvious depression or anxiety, speech and thought processing grossly intact  ASSESSMENT AND PLAN:  Discussed the following assessment and plan:  Anemia Follow cbc.   Cough Continue treatment for acid reflux.  Seeing pulmonary.  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  Continue breo.     Essential hypertension Blood pressure has been under good control.  Continue current medication regimen.  Follow pressures.  Follow metabolic panel.   GERD (gastroesophageal reflux disease) Controlled.    Hypercholesterolemia Low cholesterol diet and exercise.  Follow lipid panel.   Multiple sclerosis On ocrevus.  Followed by neurology.  Stable.   Right knee pain Saw ortho.  S/p injection.  Physical therapy.   Vitamin D deficiency Follow vitamin D level.      I discussed the assessment and treatment plan with the patient. The patient was provided an opportunity to ask questions and all were answered. The patient agreed with the plan and demonstrated an understanding of the instructions.   The patient was advised to call back or seek an in-person evaluation if the symptoms worsen or if the condition fails to improve as anticipated.   Dale [place], MD

## 2020-01-04 ENCOUNTER — Encounter: Payer: Self-pay | Admitting: Internal Medicine

## 2020-01-04 NOTE — Assessment & Plan Note (Signed)
Follow vitamin D level.  

## 2020-01-04 NOTE — Assessment & Plan Note (Signed)
Continue treatment for acid reflux.  Seeing pulmonary.  Felt to have cough variant asthma.  Continue breo.

## 2020-01-04 NOTE — Assessment & Plan Note (Signed)
Follow cbc.  

## 2020-01-04 NOTE — Assessment & Plan Note (Signed)
Blood pressure has been under good control.  Continue current medication regimen.  Follow pressures.  Follow metabolic panel.  

## 2020-01-04 NOTE — Assessment & Plan Note (Signed)
On ocrevus.  Followed by neurology.  Stable.

## 2020-01-04 NOTE — Assessment & Plan Note (Signed)
Controlled.  

## 2020-01-04 NOTE — Assessment & Plan Note (Signed)
Low cholesterol diet and exercise.  Follow lipid panel.   

## 2020-01-04 NOTE — Assessment & Plan Note (Signed)
Saw ortho.  S/p injection.  Physical therapy.

## 2020-02-06 ENCOUNTER — Encounter: Payer: Self-pay | Admitting: Internal Medicine

## 2020-02-29 ENCOUNTER — Other Ambulatory Visit: Payer: Self-pay | Admitting: Internal Medicine

## 2020-03-01 ENCOUNTER — Other Ambulatory Visit: Payer: Self-pay

## 2020-03-01 MED ORDER — ESOMEPRAZOLE MAGNESIUM 40 MG PO CPDR: DELAYED_RELEASE_CAPSULE | ORAL | 1 refills | Status: DC

## 2020-03-01 MED ORDER — METOPROLOL SUCCINATE ER 25 MG PO TB24: ORAL_TABLET | ORAL | 1 refills | Status: DC

## 2020-03-01 MED ORDER — VALSARTAN-HYDROCHLOROTHIAZIDE 320-25 MG PO TABS: 1.0000 | ORAL_TABLET | Freq: Every day | ORAL | 1 refills | Status: DC

## 2020-05-12 ENCOUNTER — Encounter: Payer: Self-pay | Admitting: Internal Medicine

## 2020-05-12 ENCOUNTER — Other Ambulatory Visit (HOSPITAL_COMMUNITY)
Admission: RE | Admit: 2020-05-12 | Discharge: 2020-05-12 | Disposition: A | Discharge status: Home / Self Care | Payer: BC Managed Care – PPO | Source: Ambulatory Visit | Attending: Internal Medicine | Admitting: Internal Medicine

## 2020-05-12 ENCOUNTER — Ambulatory Visit (INDEPENDENT_AMBULATORY_CARE_PROVIDER_SITE_OTHER): Payer: BC Managed Care – PPO | Admitting: Internal Medicine

## 2020-05-12 ENCOUNTER — Other Ambulatory Visit: Payer: Self-pay

## 2020-05-12 VITALS — BP 124/80 | HR 67 | Temp 96.6°F | Resp 16 | Ht 68.0 in | Wt 218.0 lb

## 2020-05-12 DIAGNOSIS — R05 Cough: Secondary | ICD-10-CM

## 2020-05-12 DIAGNOSIS — R059 Cough, unspecified: Secondary | ICD-10-CM

## 2020-05-12 DIAGNOSIS — E7800 Pure hypercholesterolemia, unspecified: Secondary | ICD-10-CM | POA: Diagnosis not present

## 2020-05-12 DIAGNOSIS — I10 Essential (primary) hypertension: Secondary | ICD-10-CM

## 2020-05-12 DIAGNOSIS — Z124 Encounter for screening for malignant neoplasm of cervix: Secondary | ICD-10-CM

## 2020-05-12 DIAGNOSIS — E559 Vitamin D deficiency, unspecified: Secondary | ICD-10-CM | POA: Diagnosis not present

## 2020-05-12 DIAGNOSIS — G35 Multiple sclerosis: Secondary | ICD-10-CM | POA: Diagnosis not present

## 2020-05-12 DIAGNOSIS — K219 Gastro-esophageal reflux disease without esophagitis: Secondary | ICD-10-CM

## 2020-05-12 DIAGNOSIS — D649 Anemia, unspecified: Secondary | ICD-10-CM

## 2020-05-12 DIAGNOSIS — Z0000 Encounter for general adult medical examination without abnormal findings: Secondary | ICD-10-CM

## 2020-05-12 LAB — CBC WITH DIFFERENTIAL/PLATELET
Basophils Absolute: 0.1 10*3/uL (ref 0.0–0.1)
Basophils Relative: 0.9 % (ref 0.0–3.0)
Eosinophils Absolute: 0.6 10*3/uL (ref 0.0–0.7)
Eosinophils Relative: 10.1 % — ABNORMAL HIGH (ref 0.0–5.0)
HCT: 38.8 % (ref 36.0–46.0)
Hemoglobin: 12.7 g/dL (ref 12.0–15.0)
Lymphocytes Relative: 26.3 % (ref 12.0–46.0)
Lymphs Abs: 1.6 10*3/uL (ref 0.7–4.0)
MCHC: 32.7 g/dL (ref 30.0–36.0)
MCV: 86.6 fl (ref 78.0–100.0)
Monocytes Absolute: 0.6 10*3/uL (ref 0.1–1.0)
Monocytes Relative: 9.8 % (ref 3.0–12.0)
Neutro Abs: 3.2 10*3/uL (ref 1.4–7.7)
Neutrophils Relative %: 52.9 % (ref 43.0–77.0)
Platelets: 281 10*3/uL (ref 150.0–400.0)
RBC: 4.48 Mil/uL (ref 3.87–5.11)
RDW: 14.6 % (ref 11.5–15.5)
WBC: 6 10*3/uL (ref 4.0–10.5)

## 2020-05-12 LAB — BASIC METABOLIC PANEL
BUN: 12 mg/dL (ref 6–23)
CO2: 33 mEq/L — ABNORMAL HIGH (ref 19–32)
Calcium: 9.6 mg/dL (ref 8.4–10.5)
Chloride: 103 mEq/L (ref 96–112)
Creatinine, Ser: 0.79 mg/dL (ref 0.40–1.20)
GFR: 89.22 mL/min (ref >60.00)
Glucose, Bld: 89 mg/dL (ref 70–99)
Potassium: 3.9 mEq/L (ref 3.5–5.1)
Sodium: 141 mEq/L (ref 135–145)

## 2020-05-12 LAB — LIPID PANEL
Cholesterol: 182 mg/dL (ref 0–200)
HDL: 83.6 mg/dL (ref >39.00)
LDL Cholesterol: 86 mg/dL (ref 0–99)
NonHDL: 98.58
Total CHOL/HDL Ratio: 2
Triglycerides: 61 mg/dL (ref 0.0–149.0)
VLDL: 12.2 mg/dL (ref 0.0–40.0)

## 2020-05-12 LAB — HEPATIC FUNCTION PANEL
ALT: 16 U/L (ref 0–35)
AST: 14 U/L (ref 0–37)
Albumin: 4.2 g/dL (ref 3.5–5.2)
Alkaline Phosphatase: 81 U/L (ref 39–117)
Bilirubin, Direct: 0.1 mg/dL (ref 0.0–0.3)
Total Bilirubin: 0.7 mg/dL (ref 0.2–1.2)
Total Protein: 6.3 g/dL (ref 6.0–8.3)

## 2020-05-12 LAB — TSH: TSH: 1.94 u[IU]/mL (ref 0.35–4.50)

## 2020-05-12 NOTE — Assessment & Plan Note (Signed)
Physical today 05/12/20.  PAP 05/12/20.  Mammogram 10/08/19 - Birads I.  Colonoscopy 2015.

## 2020-05-12 NOTE — Progress Notes (Addendum)
Patient ID: Jackie White, female   DOB: 05-19-1956, 64 y.o.   MRN: [id number]   Subjective:    Patient ID: Jackie White, female    DOB: October 15, 1956, 64 y.o.   MRN: [id number]  HPI This visit occurred during the SARS-CoV-2 public health emergency.  Safety protocols were in place, including screening questions prior to the visit, additional usage of staff PPE, and extensive cleaning of exam room while observing appropriate contact time as indicated for disinfecting solutions.  Patient here for her physical exam.  She reports she is doing relatively well.  Still with persistent cough/congestion.  Evaluated by pulmonary.  Started on [place].  Felt did help some at night.  Per pulmonary's last note, PFTs noted mild to moderate increased airways resistance.  Per note, like cough variant asthma versus eosinophilic bronchitis.  Still using breo.  Had recommended continuing breo and f/u in 6 months.  Also recommended continuing patanase.  States persistent cough.  Has also seen ENT for persistent cough.  recommended treating reflux.  Was questioning what further treatment available.  No acid reflux.  No chest pain or sob.  No abdominal pain or bowel change.  On ocrevus for MS.  Recently evaluated by Dr Elwyn Reach.  Feels MS stable on this medication.     Past Medical History:  Diagnosis Date  . Abnormal Pap smear   . Cervical disc disease    Cervical and lumbar disc disease  . GERD (gastroesophageal reflux disease)   . History of iron deficiency   . Hypertension   . Multiple sclerosis ([facility])   . Personal history of colonic polyps   . Reactive airway disease   . Vitamin D deficiency    Past Surgical History:  Procedure Laterality Date  . BREAST REDUCTION SURGERY    . REDUCTION MAMMAPLASTY Bilateral 2002  . TOTAL ABDOMINAL HYSTERECTOMY  2003   Right Oophrectomy   Family History  Problem Relation Age of Onset  . Stroke Mother   . Hypertension Mother   . Heart disease Maternal Grandmother   .  Breast cancer Neg Hx    Social History   Socioeconomic History  . Marital status: Married    Spouse name: Not on file  . Number of children: Not on file  . Years of education: Not on file  . Highest education level: Not on file  Occupational History  . Not on file  Tobacco Use  . Smoking status: Never Smoker  . Smokeless tobacco: Never Used  Vaping Use  . Vaping Use: Never used  Substance and Sexual Activity  . Alcohol use: Yes    Alcohol/week: 0.0 standard drinks  . Drug use: No  . Sexual activity: Not on file  Other Topics Concern  . Not on file  Social History Narrative  . Not on file   Social Determinants of Health   Financial Resource Strain:   . Difficulty of Paying Living Expenses:   Food Insecurity:   . Worried About Programme researcher, broadcasting/film/video in the Last Year:   . Barista in the Last Year:   Transportation Needs:   . Freight forwarder (Medical):   Marland Kitchen Lack of Transportation (Non-Medical):   Physical Activity:   . Days of Exercise per Week:   . Minutes of Exercise per Session:   Stress:   . Feeling of Stress :   Social Connections:   . Frequency of Communication with Friends and Family:   . Frequency of Social Gatherings  with Friends and Family:   . Attends Religious Services:   . Active Member of Clubs or Organizations:   . Attends Banker Meetings:   Marland Kitchen Marital Status:     Outpatient Encounter Medications as of 05/12/2020  Medication Sig  . albuterol (VENTOLIN HFA) 108 (90 Base) MCG/ACT inhaler Inhale 2 puffs into the lungs every 6 (six) hours as needed for wheezing or shortness of breath (cough).  . esomeprazole (NEXIUM) 40 MG capsule TAKE 1 CAPSULE(40 MG) BY MOUTH DAILY  . fluticasone furoate-vilanterol (BREO ELLIPTA) 100-25 MCG/INH AEPB Inhale 1 puff into the lungs daily.  . metoprolol succinate (TOPROL-XL) 25 MG 24 hr tablet Take on tablet by mouth daily.  [employer] (OCREVUS IV) Inject into the vein.  .  valsartan-hydrochlorothiazide (DIOVAN-HCT) 320-25 MG tablet Take 1 tablet by mouth daily.  . [DISCONTINUED] Olopatadine HCl 0.6 % SOLN Place 2 sprays into the nose 2 (two) times daily.  . [DISCONTINUED] VITAMIN D, ERGOCALCIFEROL, PO Take by mouth.   No facility-administered encounter medications on file as of 05/12/2020.    Review of Systems  Constitutional: Negative for appetite change and unexpected weight change.  HENT: Negative for congestion and sinus pressure.   Eyes: Negative for pain and visual disturbance.  Respiratory: Positive for cough. Negative for chest tightness and shortness of breath.   Cardiovascular: Negative for chest pain, palpitations and leg swelling.  Gastrointestinal: Negative for abdominal pain, diarrhea, nausea and vomiting.  Genitourinary: Negative for difficulty urinating and dysuria.  Musculoskeletal: Negative for joint swelling and myalgias.  Skin: Negative for color change and rash.  Neurological: Negative for dizziness, light-headedness and headaches.  Hematological: Negative for adenopathy. Does not bruise/bleed easily.  Psychiatric/Behavioral: Negative for agitation and dysphoric mood.       Objective:    Physical Exam Vitals reviewed.  Constitutional:      General: She is not in acute distress.    Appearance: Normal appearance. She is well-developed.  HENT:     Head: Normocephalic and atraumatic.     Right Ear: External ear normal.     Left Ear: External ear normal.  Eyes:     General: No scleral icterus.       Right eye: No discharge.        Left eye: No discharge.     Conjunctiva/sclera: Conjunctivae normal.  Neck:     Thyroid: No thyromegaly.  Cardiovascular:     Rate and Rhythm: Normal rate and regular rhythm.  Pulmonary:     Effort: No tachypnea, accessory muscle usage or respiratory distress.     Breath sounds: Normal breath sounds. No decreased breath sounds or wheezing.  Chest:     Breasts:        Right: No inverted nipple,  mass, nipple discharge or tenderness (no axillary adenopathy).        Left: No inverted nipple, mass, nipple discharge or tenderness (no axilarry adenopathy).  Abdominal:     General: Bowel sounds are normal.     Palpations: Abdomen is soft.     Tenderness: There is no abdominal tenderness.  Genitourinary:    Comments: Normal external genitalia.  Vaginal vault without lesions. S/p hysterectomy.   Pap smear performed.  Could not appreciate any adnexal masses or tenderness.   Musculoskeletal:        General: No swelling or tenderness.     Cervical back: Neck supple. No tenderness.  Lymphadenopathy:     Cervical: No cervical adenopathy.  Skin:    Findings:  No erythema or rash.  Neurological:     Mental Status: She is alert and oriented to person, place, and time.  Psychiatric:        Mood and Affect: Mood normal.        Behavior: Behavior normal.     BP 124/80   Pulse 67   Temp (!) 96.6 F (35.9 C)   Resp 16   Ht 5\' 8"  (1.727 m)   Wt 218 lb (98.9 kg)   LMP 12/27/2000   SpO2 97%   BMI 33.15 kg/m  Wt Readings from Last 3 Encounters:  05/12/20 218 lb (98.9 kg)  12/31/19 217 lb (98.4 kg)  09/15/19 218 lb (98.9 kg)     Lab Results  Component Value Date   WBC 6.0 05/12/2020   HGB 12.7 05/12/2020   HCT 38.8 05/12/2020   PLT 281.0 05/12/2020   GLUCOSE 89 05/12/2020   CHOL 182 05/12/2020   TRIG 61.0 05/12/2020   HDL 83.60 05/12/2020   LDLCALC 86 05/12/2020   ALT 16 05/12/2020   AST 14 05/12/2020   NA 141 05/12/2020   K 3.9 05/12/2020   CL 103 05/12/2020   CREATININE 0.79 05/12/2020   BUN 12 05/12/2020   CO2 33 (H) 05/12/2020   TSH 1.94 05/12/2020    Pulmonary Function Test [facility] Only  Result Date: 11/05/2019 Spirometry Data Is Acceptable and Reproducible No obvious evidence of Obstructive Airways disease or Restrictive Lung disease Consider outpatient follow up with Pulmonary if needed. Clinical Correlation Advised       Assessment & Plan:   Problem List  Items Addressed This Visit    Anemia    Follow cbc.       Cough    Acid reflux controlled.  Seeing pulmonary.  Persistent cough.  On breo.  D/w pulmonary regarding further treatment given persistent symptoms.  PFTs as outlined.  Has seen ENT.  Recommended treating reflux.        Essential hypertension    Blood pressure doing well on metoprolol and diovan/hctz.  Follow pressures.  Follow metabolic panel.        GERD (gastroesophageal reflux disease)    No acid reflux symptoms.  On nexium.        Health care maintenance    Physical today 05/12/20.  PAP 05/12/20.  Mammogram 10/08/19 - Birads I.  Colonoscopy 2015.        Hypercholesterolemia - Primary    Low cholesterol diet and exercise.  Follow lipid panel.       Relevant Orders   TSH (Completed)   Lipid panel (Completed)   Hepatic function panel (Completed)   Basic metabolic panel (Completed)   Multiple sclerosis ([place])    On ocrevus.  Seeing Dr Gypsy Balsam.        Relevant Orders   CBC with Differential/Platelet (Completed)   Vitamin D deficiency    Follow vitamin d level.         Other Visit Diagnoses    Cervical cancer screening       Relevant Orders   Cytology - PAP( [place]) (Completed)       Einar Pheasant, MD

## 2020-05-13 LAB — CYTOLOGY - PAP
Comment: NEGATIVE
Diagnosis: NEGATIVE
High risk HPV: NEGATIVE

## 2020-05-23 ENCOUNTER — Encounter: Payer: Self-pay | Admitting: Internal Medicine

## 2020-05-23 NOTE — Assessment & Plan Note (Signed)
Follow cbc.  

## 2020-05-23 NOTE — Assessment & Plan Note (Signed)
No acid reflux symptoms.  On nexium.   

## 2020-05-23 NOTE — Assessment & Plan Note (Signed)
Follow vitamin d level.   

## 2020-05-23 NOTE — Assessment & Plan Note (Signed)
On ocrevus.  Seeing Dr Jayme Cloud.

## 2020-05-23 NOTE — Assessment & Plan Note (Signed)
Blood pressure doing well on metoprolol and diovan/hctz.  Follow pressures.  Follow metabolic panel.

## 2020-05-23 NOTE — Assessment & Plan Note (Signed)
Low cholesterol diet and exercise.  Follow lipid panel.   

## 2020-05-23 NOTE — Assessment & Plan Note (Addendum)
Acid reflux controlled.  Seeing pulmonary.  Persistent cough.  On breo.  D/w pulmonary regarding further treatment given persistent symptoms.  PFTs as outlined.  Has seen ENT.  Recommended treating reflux.

## 2020-06-07 NOTE — Progress Notes (Signed)
Cardiology Office Note  Date:  06/08/2020   ID:  Jackie White White 02-Jul-1956, MRN [id number]  PCP:  [facility], [place]   Chief Complaint  Patient presents with  . other    12 month follow up. Meds reviewed by the pt. verbally. Pt. c/o shortness of breath with little to no exertion.     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[facility] [employer]  Echocardiogram June 2020 with normal ejection fraction Grade 2 diastolic dysfunction, mildly elevated right heart pressures, mildly dilated left atrium  On her last visit she declined Lasix  On today's visit she reports having significant Congestion, Lots of coughing,"stuff comes up", some yellow color Has been going on for about a year Previously seen by pulmonary, scheduled to see pulmonary again Significant cough at night, some in day On breo, Ventolin inhaler Some wheezing Unclear if she has nasal congestion  She is concerned about side effects from her Jackie White medication because of her chest congestion We did look into side effects,   Lifestyle discussed in detail Does not eat out Denies excessive salt intake  Working with PT for her knee, started aqua therapy Retired, would like to travel No regular exercise program  Lab work reviewed with her Total cholesterol 182, LDL 86 Normal renal function Glu 86  EKG personally reviewed by myself on todays visit NSR rate 64 no significant ST or T wave changes  Other past medical history reviewed  Infusion  Q6 months for Jackie White Disease has been relatively  well controlled past 2 years  Family hx Aunt has amyloid CAD  PMH:   has a past medical history of Abnormal Pap smear, Cervical disc disease, GERD (gastroesophageal reflux disease), History of iron deficiency, Hypertension, Multiple sclerosis (HCC), Personal history of colonic polyps, Reactive airway disease, and Vitamin D deficiency.  PSH:    Past Surgical History:  Procedure Laterality Date  . BREAST REDUCTION SURGERY    . REDUCTION MAMMAPLASTY Bilateral 2002  . TOTAL ABDOMINAL HYSTERECTOMY  2003   Right Oophrectomy    Current Outpatient Medications  Medication Sig Dispense Refill  . albuterol (VENTOLIN HFA) 108 (90 Base) MCG/ACT inhaler Inhale 2 puffs into the lungs every 6 (six) hours as needed for wheezing or shortness of breath (cough). 18 g 6  . esomeprazole (NEXIUM) 40 MG capsule TAKE 1 CAPSULE(40 MG) BY MOUTH DAILY 90 capsule 1  . fluticasone furoate-vilanterol (BREO ELLIPTA) 100-25 MCG/INH AEPB Inhale 1 puff into the lungs daily. 28 each 11  . metoprolol succinate (TOPROL-XL) 25 MG 24 hr tablet Take on tablet by mouth daily. 90 tablet 1  . Ocrelizumab (OCREVUS IV) Inject into the vein.    . valsartan-hydrochlorothiazide (DIOVAN-HCT) 320-25 MG tablet Take 1 tablet by mouth daily. 90 tablet 1   No current facility-administered medications for this visit.     Allergies:   Patient has no known allergies.   Social History:  The patient  reports that she has never smoked. She has never used smokeless tobacco. She reports current alcohol use. She  reports that she does not use drugs.   Family History:   family history includes Heart disease in her maternal grandmother; Hypertension in her mother; Stroke in her mother.    Review of Systems: Review of Systems  Constitutional: Negative.   HENT: Negative.   Respiratory: Positive for cough.   Cardiovascular: Negative.   Gastrointestinal: Negative.   Musculoskeletal: Negative.   Neurological: Negative.    Psychiatric/Behavioral: Negative.   All other systems reviewed and are negative.   PHYSICAL EXAM: VS:  BP 130/80 (BP Location: Left Arm, Patient Position: Sitting, Cuff Size: Large)   Pulse 64   Ht 5' 7.5" (1.715 m)   Wt 220 lb (99.8 kg)   LMP 12/27/2000   SpO2 98%   BMI 33.95 kg/m  , BMI Body mass index is 33.95 kg/m. Constitutional:  oriented to person, place, and time. No distress.  HENT:  Head: Grossly normal Eyes:  no discharge. No scleral icterus.  Neck: No JVD, no carotid bruits  Cardiovascular: Regular rate and rhythm, no murmurs appreciated Pulmonary/Chest: Clear to auscultation bilaterally, no wheezes or rails Abdominal: Soft.  no distension.  no tenderness.  Musculoskeletal: Normal range of motion Neurological:  normal muscle tone. Coordination normal. No atrophy Skin: Skin warm and dry Psychiatric: normal affect, pleasant   Recent Labs: 05/12/2020: ALT 16; BUN 12; Creatinine, Ser 0.79; Hemoglobin 12.7; Platelets 281.0; Potassium 3.9; Sodium 141; TSH 1.94    Lipid Panel Lab Results  Component Value Date   CHOL 182 05/12/2020   HDL 83.60 05/12/2020   LDLCALC 86 05/12/2020   TRIG 61.0 05/12/2020      Wt Readings from Last 3 Encounters:  06/08/20 220 lb (99.8 kg)  05/12/20 218 lb (98.9 kg)  12/31/19 217 lb (98.4 kg)      ASSESSMENT AND PLAN:  Problem List Items Addressed This Visit      Cardiology Problems   Essential hypertension   Relevant Orders   EKG 12-Lead   Hypercholesterolemia     Other   Multiple sclerosis (HCC)   Shortness of breath    Other Visit Diagnoses    SOB (shortness of breath)    -  Primary   Relevant Orders   EKG 12-Lead     Shortness of breath Reported on prior office visit Recommended regular exercise program, consider regular aqua therapy given chronic knee pain for which she is receiving PT Lifestyle modification, strict diet for weight loss  Essential hypertension Blood pressure stable on valsartan HCTZ with  metoprolol Potassium level reviewed, stable No changes to medications  Jackie White Has worked with physical therapy, has chronic leg weakness Knee pain, doing aqua therapy  Cough Worsening issue over the past year, followed by pulmonary Researched and discussed side effects of her Jackie White medication with her today which can be associated with respiratory infections She is on several inhalers, no improvement in symptoms, mild expiratory wheezing on exam She has follow-up with pulmonary, unclear if she needs antibiotics for chronic bronchitis.    Family history of amyloid EKG essentially normal, echocardiogram with no LVH, CT scan chest no acute findings No further work-up at this time, details discussed with her    Total encounter time more than 25 minutes  Greater than 50% was spent in counseling and coordination of care with the patient   Orders Placed This Encounter  Procedures  . EKG 12-Lead     Signed, Dossie Arbour, M.D., Ph.D. 06/08/2020  [facility] [place], [place] [phone]

## 2020-06-08 ENCOUNTER — Ambulatory Visit
Admission: RE | Admit: 2020-06-08 | Discharge: 2020-06-08 | Disposition: A | Discharge status: Home / Self Care | Payer: BC Managed Care – PPO | Source: Ambulatory Visit | Attending: Pulmonary Disease | Admitting: Pulmonary Disease

## 2020-06-08 ENCOUNTER — Ambulatory Visit
Admission: RE | Admit: 2020-06-08 | Discharge: 2020-06-08 | Disposition: A | Discharge status: Home / Self Care | Payer: BC Managed Care – PPO | Attending: Pulmonary Disease | Admitting: Pulmonary Disease

## 2020-06-08 ENCOUNTER — Other Ambulatory Visit
Admission: RE | Admit: 2020-06-08 | Discharge: 2020-06-08 | Disposition: A | Discharge status: Home / Self Care | Payer: BC Managed Care – PPO | Source: Home / Self Care | Attending: Pulmonary Disease | Admitting: Pulmonary Disease

## 2020-06-08 ENCOUNTER — Ambulatory Visit (INDEPENDENT_AMBULATORY_CARE_PROVIDER_SITE_OTHER): Payer: BC Managed Care – PPO | Admitting: Cardiovascular Disease

## 2020-06-08 ENCOUNTER — Encounter: Payer: Self-pay | Admitting: Pulmonary Disease

## 2020-06-08 ENCOUNTER — Ambulatory Visit: Payer: BC Managed Care – PPO | Admitting: Pulmonary Disease

## 2020-06-08 ENCOUNTER — Other Ambulatory Visit: Payer: Self-pay

## 2020-06-08 ENCOUNTER — Encounter: Payer: Self-pay | Admitting: Cardiovascular Disease

## 2020-06-08 VITALS — BP 130/80 | HR 64 | Temp 97.8°F | Ht 67.5 in | Wt 219.6 lb

## 2020-06-08 VITALS — BP 130/80 | HR 64 | Ht 67.5 in | Wt 220.0 lb

## 2020-06-08 DIAGNOSIS — G35 Multiple sclerosis: Secondary | ICD-10-CM

## 2020-06-08 DIAGNOSIS — E7800 Pure hypercholesterolemia, unspecified: Secondary | ICD-10-CM

## 2020-06-08 DIAGNOSIS — I10 Essential (primary) hypertension: Secondary | ICD-10-CM

## 2020-06-08 DIAGNOSIS — J45991 Cough variant asthma: Secondary | ICD-10-CM

## 2020-06-08 DIAGNOSIS — R062 Wheezing: Secondary | ICD-10-CM

## 2020-06-08 DIAGNOSIS — R059 Cough, unspecified: Secondary | ICD-10-CM

## 2020-06-08 DIAGNOSIS — R0602 Shortness of breath: Secondary | ICD-10-CM | POA: Diagnosis not present

## 2020-06-08 DIAGNOSIS — R05 Cough: Secondary | ICD-10-CM | POA: Diagnosis not present

## 2020-06-08 DIAGNOSIS — R053 Chronic cough: Secondary | ICD-10-CM

## 2020-06-08 DIAGNOSIS — D7218 Eosinophilia in diseases classified elsewhere: Secondary | ICD-10-CM

## 2020-06-08 MED ORDER — TRELEGY ELLIPTA 200-62.5-25 MCG/INH IN AEPB: 1.0000 | INHALATION_SPRAY | Freq: Every day | RESPIRATORY_TRACT | 0 refills | Status: DC

## 2020-06-08 MED ORDER — AZITHROMYCIN 250 MG PO TABS: ORAL_TABLET | ORAL | 0 refills | Status: AC

## 2020-06-08 NOTE — Progress Notes (Signed)
 Subjective:    Patient ID: Jackie White, female    DOB: 06-18-1956, 64 y.o.   MRN: [id number]  HPI 64 year old lifelong never smoker, with a history of MS on Ocrevus , following up on the issue of cough and wheezing.  Her symptoms are mostly nocturnal.  She notes some relief with albuterol .  She was last evaluated 19 November 2019 via telephone visit (pandemic directed).  At that time a 6 months follow-up was recommended.  It was recommended that she continue Breo Ellipta .  She continues to have issues with cough and production of yellowish sputum.  We have discussed with her the impact of Ocrevus  on respiratory symptoms.  She does not endorse any fevers, chills or sweats  Review of Systems A 10 point review of systems was performed and it is as noted above otherwise negative.  No Known Allergies  Current Meds  Medication Sig   albuterol  (VENTOLIN  HFA) 108 (90 Base) MCG/ACT inhaler Inhale 2 puffs into the lungs every 6 (six) hours as needed for wheezing or shortness of breath (cough).   azithromycin  (ZITHROMAX ) 250 MG tablet Take 2 tablets (500 mg) on  Day 1,  followed by 1 tablet (250 mg) once daily on Days 2 through 5.   esomeprazole  (NEXIUM ) 40 MG capsule TAKE 1 CAPSULE(40 MG) BY MOUTH DAILY   fluticasone  furoate-vilanterol (BREO ELLIPTA ) 100-25 MCG/INH AEPB Inhale 1 puff into the lungs daily.   Fluticasone -Umeclidin-Vilant (TRELEGY ELLIPTA ) 200-62.5-25 MCG/INH AEPB Inhale 1 puff into the lungs daily.   metoprolol  succinate (TOPROL -XL) 25 MG 24 hr tablet Take on tablet by mouth daily.   Ocrelizumab  (OCREVUS  IV) Inject into the vein.   valsartan -hydrochlorothiazide  (DIOVAN -HCT) 320-25 MG tablet Take 1 tablet by mouth daily.   Immunization History  Administered Date(s) Administered   Influenza Split 09/26/2012, 09/24/2013   Influenza, High Dose Seasonal PF 09/08/2019   Influenza-Unspecified 08/27/2014, 09/21/2015, 09/20/2016, 09/10/2018   PFIZER SARS-COV-2 Vaccination 02/10/2020,  03/02/2020   Zoster Recombinat (Shingrix) 09/11/2019        Objective:   Physical Exam BP 130/80 (BP Location: Left Arm, Cuff Size: Normal)   Pulse 64   Temp 97.8 F (36.6 C) (Temporal)   Ht 5' 7.5 (1.715 m)   Wt 219 lb 9.6 oz (99.6 kg)   LMP 12/27/2000   SpO2 98%   BMI 33.89 kg/m   GENERAL: Well-developed, well-nourished woman, no acute distress HEAD: Normocephalic, atraumatic.  EYES: Pupils equal, round, reactive to light.  No scleral icterus.  MOUTH: Nose/mouth/throat not examined due to institutional masking requirements for COVID-19. NECK: Supple. No thyromegaly. Trachea midline. No JVD.  No adenopathy. PULMONARY: Air entry bilaterally.  Coarse breath sounds, intermittent end expiratory wheeze particularly in the upper lung zones. CARDIOVASCULAR: S1 and S2. Regular rate and rhythm.  GASTROINTESTINAL: Benign MUSCULOSKELETAL: No joint deformity, no clubbing, no edema.  NEUROLOGIC: No overt focal deficit, no gait disturbance, speech is fluent. SKIN: Intact,warm,dry. PSYCH: Mood and behavior normal.      Assessment & Plan:     ICD-10-CM   1. Cough  R05 Hypersensitivity Pneumonitis    DG Chest 2 View    2. Eosinophilic bronchitis  J40    D72.18     3. 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 Multiple sclerosis (HCC)  G35     5. Wheezing  R06.2      Meds ordered this encounter  Medications   Fluticasone -Umeclidin-Vilant (TRELEGY ELLIPTA ) 200-62.5-25 MCG/INH AEPB    Sig: Inhale 1 puff into  the lungs daily.    Dispense:  28 each    Refill:  0    Lot Number?:   [id number]    Expiration Date?:   07/21/2021    Manufacturer?:   Gl[employer]Smith[employer] [[id number]]    Quantity:   2   azithromycin  (ZITHROMAX ) 250 MG tablet    Sig: Take 2 tablets (500 mg) on  Day 1,  followed by 1 tablet (250 mg) once daily on Days 2 through 5.    Dispense:  6 each    Refill:  0   See the patient in follow-up in 6 months time call sooner should any new problems arise.  KYM Leita Sanders, MD [facility]    *This note was generated using voice recognition software.  Despite best efforts to proofread, errors can occur which can change the meaning. Any transcriptional errors that result from this process are unintentional and may not be fully corrected at the time of dictation.

## 2020-06-08 NOTE — Patient Instructions (Signed)

## 2020-06-08 NOTE — Patient Instructions (Addendum)
We are going to do a chest x-ray of the lungs to evaluate for any potential issues  We will do a blood draw to test for molds  I am changing your inhaler to Trelegy Ellipta 200/62.5/25, 1 inhalation daily.  Make sure you rinse your mouth well after you use it use a little sodium bicarbonate (baking soda) in the water to clear the medication well and prevent thrush  Let us know how the Trelegy works for you.  Do not use the Breo while taking Trelegy.  I am also giving you a trial of Azithromycin, let us know how this works for you.  Ultimately I think some of these issues may be related to the Ocrevus.  We will see you in follow-up in 3 to 4 weeks time call sooner should any new difficulties arise.

## 2020-06-11 LAB — HYPERSENSITIVITY PNEUMONITIS
A. Pullulans Abs: NEGATIVE
A.Fumigatus #1 Abs: NEGATIVE
Micropolyspora faeni, IgG: NEGATIVE
Pigeon Serum Abs: NEGATIVE
Thermoact. Saccharii: NEGATIVE
Thermoactinomyces vulgaris, IgG: NEGATIVE

## 2020-07-06 ENCOUNTER — Ambulatory Visit (INDEPENDENT_AMBULATORY_CARE_PROVIDER_SITE_OTHER): Payer: BC Managed Care – PPO | Admitting: Adult Health

## 2020-07-06 ENCOUNTER — Other Ambulatory Visit: Payer: Self-pay

## 2020-07-06 ENCOUNTER — Encounter: Payer: Self-pay | Admitting: Adult Health

## 2020-07-06 DIAGNOSIS — J45991 Cough variant asthma: Secondary | ICD-10-CM

## 2020-07-06 MED ORDER — MONTELUKAST SODIUM 10 MG PO TABS: 10.0000 mg | ORAL_TABLET | Freq: Every day | ORAL | 11 refills | Status: DC

## 2020-07-06 MED ORDER — PREDNISONE 20 MG PO TABS: 20.0000 mg | ORAL_TABLET | Freq: Every day | ORAL | 0 refills | Status: DC

## 2020-07-06 NOTE — Assessment & Plan Note (Signed)
Cough variant asthma with suspected allergic component as eosinophils are elevated. Patient is currently having active wheezing. We will treat with a short course of prednisone. Previous chest x-ray and cardiac CT showed clear lungs. We will hold off on dedicated high-resolution CT chest. Previous pulmonary function testing showed no airflow obstruction or restriction. DLCO was slightly decreased at 77%. We rechallenge with Singulair and Zyrtec as Jackie White is now on Breo to see if this combination will help to better control her allergic symptoms. Jackie White would not be a candidate for Biologics as Jackie White is on active treatment for her MS We will add cough control regimen with Delsym.  Plan  Patient Instructions  Begin Prednisone 20mg  daily 4 days , take with food  Stop TRELEGY .  Restart BREO 1 puff daily , rinse after use.  Begin Singulair 10mg  At bedtime   Begin Zyrtec 10mg  At bedtime   Begin Delsym 2 tsp Twice daily  For cough As needed   Sips of water to soothe throat and avoid throat clearing .  Continue on Nexium 40mg  At bedtime  .  Albuterol inhaler As needed   Follow up in 2-3 months and As needed with Dr.  Please contact office for sooner follow up if symptoms do not improve or worsen or seek emergency care

## 2020-07-06 NOTE — Patient Instructions (Addendum)
Begin Prednisone 20mg  daily 4 days , take with food  Stop TRELEGY .  Restart BREO 1 puff daily , rinse after use.  Begin Singulair 10mg  At bedtime   Begin Zyrtec 10mg  At bedtime   Begin Delsym 2 tsp Twice daily  For cough As needed   Sips of water to soothe throat and avoid throat clearing .  Continue on Nexium 40mg  At bedtime  .  Albuterol inhaler As needed   Follow up in 2-3 months and As needed with Dr.  Please contact office for sooner follow up if symptoms do not improve or worsen or seek emergency care

## 2020-07-06 NOTE — Progress Notes (Signed)
@Patient  ID: , female    DOB: January 13, 1956, 64 y.o.   MRN: [id number]  Chief Complaint  Patient presents with  . Follow-up    asthma     Referring provider: [id number], MD  HPI: 65 year old female never smoker followed for chronic cough, cough variant asthma Medical history significant for multiple sclerosis on Ocrevus  TEST/EVENTS :  Pulmonary function testing December 2020 showed FEV1 at 89%, no significant bronchodilator response.  07/06/2020 Follow up : Chronic Cough /cough variant asthma  Patient returns for a 1 month follow-up. Patient was seen last visit with ongoing chronic cough. Patient's cough is minimally productive she has intermittent wheezing seems with symptoms are worse at night. These have been going on for 2 to 3 years. She has had an extensive work-up. Has been tried on Qvar in the past without any perceived benefit. Was given Breo with some decrease in her daily symptoms. Chest x-ray has remained clear. Cardiac CT May 21, 2019 showed clear lungs. Hypersensitivity pneumonitis panel was negative. IgE was 24., Allergy profile mildly positive for grass, ragweed and tree. Labs have showed elevated eosinophils last visit eosinophils increased from 300-600 absolute count Patient has been tried on Singulair 1.5 years ago without any perceived benefit. Last visit was changed from [facility] [employer] to Trelegy but had no perceived benefit. Wants to go back to [place].    No Known Allergies  Immunization History  Administered Date(s) Administered  . Influenza Split 09/26/2012, 09/24/2013  . Influenza, High Dose Seasonal PF 09/08/2019  . Influenza-Unspecified 08/27/2014, 09/21/2015, 09/20/2016, 09/10/2018  . PFIZER SARS-COV-2 Vaccination 02/10/2020, 03/02/2020  . Zoster Recombinat (Shingrix) 09/11/2019    Past Medical History:  Diagnosis Date  . Abnormal Pap smear   . Cervical disc disease    Cervical and lumbar disc disease  . GERD (gastroesophageal reflux  disease)   . History of iron deficiency   . Hypertension   . Multiple sclerosis (HCC)   . Personal history of colonic polyps   . Reactive airway disease   . Vitamin D deficiency     Tobacco History: Social History   Tobacco Use  Smoking Status Never Smoker  Smokeless Tobacco Never Used   Counseling given: Not Answered   Outpatient Medications Prior to Visit  Medication Sig Dispense Refill  . albuterol (VENTOLIN HFA) 108 (90 Base) MCG/ACT inhaler Inhale 2 puffs into the lungs every 6 (six) hours as needed for wheezing or shortness of breath (cough). 18 g 6  . esomeprazole (NEXIUM) 40 MG capsule TAKE 1 CAPSULE(40 MG) BY MOUTH DAILY 90 capsule 1  . metoprolol succinate (TOPROL-XL) 25 MG 24 hr tablet Take on tablet by mouth daily. 90 tablet 1  . Ocrelizumab (OCREVUS IV) Inject into the vein.    . valsartan-hydrochlorothiazide (DIOVAN-HCT) 320-25 MG tablet Take 1 tablet by mouth daily. 90 tablet 1  . Fluticasone-Umeclidin-Vilant (TRELEGY ELLIPTA) 200-62.5-25 MCG/INH AEPB Inhale 1 puff into the lungs daily. 28 each 0  . fluticasone furoate-vilanterol (BREO ELLIPTA) 100-25 MCG/INH AEPB Inhale 1 puff into the lungs daily. (Patient not taking: Reported on 07/06/2020) 28 each 11   No facility-administered medications prior to visit.     Review of Systems:   Constitutional:   No  weight loss, night sweats,  Fevers, chills, + fatigue, or  lassitude.  HEENT:   No headaches,  Difficulty swallowing,  Tooth/dental problems, or  Sore throat,                No sneezing, itching,  ear ache, nasal congestion, post nasal drip,   CV:  No chest pain,  Orthopnea, PND, swelling in lower extremities, anasarca, dizziness, palpitations, syncope.   GI  No heartburn, indigestion, abdominal pain, nausea, vomiting, diarrhea, change in bowel habits, loss of appetite, bloody stools.   Resp:    No chest wall deformity  Skin: no rash or lesions.  GU: no dysuria, change in color of urine, no urgency or  frequency.  No flank pain, no hematuria   MS:  No joint pain or swelling.  No decreased range of motion.  No back pain.    Physical Exam  BP 126/76 (BP Location: Left Arm, Cuff Size: Normal)   Pulse 77   Temp 97.7 F (36.5 C) (Temporal)   Ht 5' 7.5" (1.715 m)   Wt 221 lb 3.2 oz (100.3 kg)   LMP 12/27/2000   SpO2 96%   BMI 34.13 kg/m   GEN: A/Ox3; pleasant , NAD, well nourished    HEENT:  [place]/AT,   , NOSE-clear, THROAT-clear, no lesions, no postnasal drip or exudate noted.   NECK:  Supple w/ fair ROM; no JVD; normal carotid impulses w/o bruits; no thyromegaly or nodules palpated; no lymphadenopathy.    RESP  Few trace exp wheezes   no accessory muscle use, no dullness to percussion  CARD:  RRR, no m/r/g, no peripheral edema, pulses intact, no cyanosis or clubbing.  GI:   Soft & nt; nml bowel sounds; no organomegaly or masses detected.   Musco: Warm bil, no deformities or joint swelling noted.   Neuro: alert, no focal deficits noted.    Skin: Warm, no lesions or rashes    Lab Results:  CBC    Component Value Date/Time   WBC 6.0 05/12/2020 0930   RBC 4.48 05/12/2020 0930   HGB 12.7 05/12/2020 0930   HCT 38.8 05/12/2020 0930   PLT 281.0 05/12/2020 0930   MCV 86.6 05/12/2020 0930   MCH 27.1 03/01/2016 1324   MCHC 32.7 05/12/2020 0930   RDW 14.6 05/12/2020 0930   LYMPHSABS 1.6 05/12/2020 0930   MONOABS 0.6 05/12/2020 0930   EOSABS 0.6 05/12/2020 0930   BASOSABS 0.1 05/12/2020 0930    BMET    Component Value Date/Time   NA 141 05/12/2020 0930   NA 142 07/09/2012 0000   K 3.9 05/12/2020 0930   CL 103 05/12/2020 0930   CO2 33 (H) 05/12/2020 0930   GLUCOSE 89 05/12/2020 0930   BUN 12 05/12/2020 0930   BUN 16 07/09/2012 0000   CREATININE 0.79 05/12/2020 0930   CREATININE 0.82 10/12/2012 0842   CALCIUM 9.6 05/12/2020 0930   GFRNONAA >60 03/01/2016 1324   GFRNONAA >60 10/12/2012 0842   GFRAA >60 03/01/2016 1324   GFRAA >60 10/12/2012 0842    BNP No  results found for: BNP  ProBNP No results found for: PROBNP  Imaging: DG Chest 2 View  Result Date: 06/09/2020 CLINICAL DATA:  64 year old female with a history of cough EXAM: CHEST - 2 VIEW COMPARISON:  08/13/2019 FINDINGS: Cardiomediastinal silhouette unchanged in size and contour. No pneumothorax. No pleural effusion. No confluent airspace disease. Coarsened interstitial markings similar to the prior. No displaced fracture.  Degenerative changes of the spine IMPRESSION: . negative for acute cardiopulmonary disease. Electronically Signed   By: Gilmer Mor D.O.   On: 06/09/2020 11:37      No flowsheet data found.  No results found for: NITRICOXIDE      Assessment & Plan:  Cough variant asthma Cough variant asthma with suspected allergic component as eosinophils are elevated. Patient is currently having active wheezing. We will treat with a short course of prednisone. Previous chest x-ray and cardiac CT showed clear lungs. We will hold off on dedicated high-resolution CT chest. Previous pulmonary function testing showed no airflow obstruction or restriction. DLCO was slightly decreased at 77%. We rechallenge with Singulair and Zyrtec as she is now on Breo to see if this combination will help to better control her allergic symptoms. She would not be a candidate for Biologics as she is on active treatment for her MS We will add cough control regimen with Delsym.  Plan  Patient Instructions  Begin Prednisone 20mg  daily 4 days , take with food  Stop TRELEGY .  Restart BREO 1 puff daily , rinse after use.  Begin Singulair 10mg  At bedtime   Begin Zyrtec 10mg  At bedtime   Begin Delsym 2 tsp Twice daily  For cough As needed   Sips of water to soothe throat and avoid throat clearing .  Continue on Nexium 40mg  At bedtime  .  Albuterol inhaler As needed   Follow up in 2-3 months and As needed with Dr.  Please contact office for sooner follow up if symptoms do not improve or  worsen or seek emergency care          , NP 07/06/2020

## 2020-08-06 NOTE — Progress Notes (Signed)
Agree with the details of the procedure as noted by Tammy Parrett, NP.  C. Laura Caprice Mccaffrey, MD [place] [facility] 

## 2020-08-25 ENCOUNTER — Other Ambulatory Visit: Payer: Self-pay | Admitting: Internal Medicine

## 2020-09-14 ENCOUNTER — Other Ambulatory Visit: Payer: Self-pay

## 2020-09-14 ENCOUNTER — Ambulatory Visit (INDEPENDENT_AMBULATORY_CARE_PROVIDER_SITE_OTHER): Payer: BC Managed Care – PPO | Admitting: Internal Medicine

## 2020-09-14 ENCOUNTER — Encounter: Payer: Self-pay | Admitting: Internal Medicine

## 2020-09-14 VITALS — BP 122/84 | HR 68 | Temp 98.4°F | Ht 67.5 in | Wt 219.6 lb

## 2020-09-14 DIAGNOSIS — E7800 Pure hypercholesterolemia, unspecified: Secondary | ICD-10-CM

## 2020-09-14 DIAGNOSIS — K219 Gastro-esophageal reflux disease without esophagitis: Secondary | ICD-10-CM

## 2020-09-14 DIAGNOSIS — I10 Essential (primary) hypertension: Secondary | ICD-10-CM

## 2020-09-14 DIAGNOSIS — D649 Anemia, unspecified: Secondary | ICD-10-CM | POA: Diagnosis not present

## 2020-09-14 DIAGNOSIS — G35 Multiple sclerosis: Secondary | ICD-10-CM

## 2020-09-14 DIAGNOSIS — E559 Vitamin D deficiency, unspecified: Secondary | ICD-10-CM

## 2020-09-14 DIAGNOSIS — L989 Disorder of the skin and subcutaneous tissue, unspecified: Secondary | ICD-10-CM

## 2020-09-14 DIAGNOSIS — Z23 Encounter for immunization: Secondary | ICD-10-CM | POA: Diagnosis not present

## 2020-09-14 DIAGNOSIS — R059 Cough, unspecified: Secondary | ICD-10-CM

## 2020-09-14 LAB — CBC WITH DIFFERENTIAL/PLATELET
Basophils Absolute: 0.1 10*3/uL (ref 0.0–0.1)
Basophils Relative: 1 % (ref 0.0–3.0)
Eosinophils Absolute: 0.8 10*3/uL — ABNORMAL HIGH (ref 0.0–0.7)
Eosinophils Relative: 11.2 % — ABNORMAL HIGH (ref 0.0–5.0)
HCT: 42 % (ref 36.0–46.0)
Hemoglobin: 13.9 g/dL (ref 12.0–15.0)
Lymphocytes Relative: 28.1 % (ref 12.0–46.0)
Lymphs Abs: 1.9 10*3/uL (ref 0.7–4.0)
MCHC: 33 g/dL (ref 30.0–36.0)
MCV: 85.2 fl (ref 78.0–100.0)
Monocytes Absolute: 0.8 10*3/uL (ref 0.1–1.0)
Monocytes Relative: 11.3 % (ref 3.0–12.0)
Neutro Abs: 3.4 10*3/uL (ref 1.4–7.7)
Neutrophils Relative %: 48.4 % (ref 43.0–77.0)
Platelets: 304 10*3/uL (ref 150.0–400.0)
RBC: 4.93 Mil/uL (ref 3.87–5.11)
RDW: 14.7 % (ref 11.5–15.5)
WBC: 6.9 10*3/uL (ref 4.0–10.5)

## 2020-09-14 LAB — BASIC METABOLIC PANEL WITH GFR
BUN: 14 mg/dL (ref 6–23)
CO2: 33 meq/L — ABNORMAL HIGH (ref 19–32)
Calcium: 9.9 mg/dL (ref 8.4–10.5)
Chloride: 102 meq/L (ref 96–112)
Creatinine, Ser: 0.81 mg/dL (ref 0.40–1.20)
GFR: 77.83 mL/min (ref >60.00)
Glucose, Bld: 83 mg/dL (ref 70–99)
Potassium: 4.2 meq/L (ref 3.5–5.1)
Sodium: 141 meq/L (ref 135–145)

## 2020-09-14 LAB — LIPID PANEL
Cholesterol: 197 mg/dL (ref 0–200)
HDL: 86.9 mg/dL (ref >39.00)
LDL Cholesterol: 97 mg/dL (ref 0–99)
NonHDL: 109.64
Total CHOL/HDL Ratio: 2
Triglycerides: 65 mg/dL (ref 0.0–149.0)
VLDL: 13 mg/dL (ref 0.0–40.0)

## 2020-09-14 LAB — HEPATIC FUNCTION PANEL
ALT: 11 U/L (ref 0–35)
AST: 13 U/L (ref 0–37)
Albumin: 4.1 g/dL (ref 3.5–5.2)
Alkaline Phosphatase: 84 U/L (ref 39–117)
Bilirubin, Direct: 0.1 mg/dL (ref 0.0–0.3)
Total Bilirubin: 0.7 mg/dL (ref 0.2–1.2)
Total Protein: 6.1 g/dL (ref 6.0–8.3)

## 2020-09-14 NOTE — Progress Notes (Signed)
Patient ID: MATTALYN ANDEREGG, female   DOB: Feb 04, 1956, 64 y.o.   MRN: [id number]   Subjective:    Patient ID: [name], female    DOB: 1955/12/10, 64 y.o.   MRN: [id number]  HPI This visit occurred during the SARS-CoV-2 public health emergency.  Safety protocols were in place, including screening questions prior to the visit, additional usage of staff PPE, and extensive cleaning of exam room while observing appropriate contact time as indicated for disinfecting solutions.  Patient here for a scheduled follow up.  Has MS.  Followed by neurology.  On ocrelizumab.  Overall feels things are stable on this medication.  Has a persistent cough.  Has seen pulmonary.  Takes delsym at night. Treated with prednisone.  Helped some.  Using breo - helped some.  Still with cough.  Was questioning if could be caused by ocrelizumab.  She feels this medication is working for her and does not want to change (even if this was the cause).  Unclear etiology of cough.  No acid reflux.  Taking singulair.  No abdominal pain.  Bowels moving.  Lesion left leg - persistent.  Discussed referral to dermatology.    Past Medical History:  Diagnosis Date  . Abnormal Pap smear   . Cervical disc disease    Cervical and lumbar disc disease  . GERD (gastroesophageal reflux disease)   . History of iron deficiency   . Hypertension   . Multiple sclerosis (HCC)   . Personal history of colonic polyps   . Reactive airway disease   . Vitamin D deficiency    Past Surgical History:  Procedure Laterality Date  . BREAST REDUCTION SURGERY    . REDUCTION MAMMAPLASTY Bilateral 2002  . TOTAL ABDOMINAL HYSTERECTOMY  2003   Right Oophrectomy   Family History  Problem Relation Age of Onset  . Stroke Mother   . Hypertension Mother   . Heart disease Maternal Grandmother   . Breast cancer Neg Hx    Social History   Socioeconomic History  . Marital status: Married    Spouse name: Not on file  . Number of children: Not on file   . Years of education: Not on file  . Highest education level: Not on file  Occupational History  . Not on file  Tobacco Use  . Smoking status: Never Smoker  . Smokeless tobacco: Never Used  Vaping Use  . Vaping Use: Never used  Substance and Sexual Activity  . Alcohol use: Yes    Alcohol/week: 0.0 standard drinks  . Drug use: No  . Sexual activity: Not on file  Other Topics Concern  . Not on file  Social History Narrative  . Not on file   Social Determinants of Health   Financial Resource Strain:   . Difficulty of Paying Living Expenses: Not on file  Food Insecurity:   . Worried About Programme researcher, broadcasting/film/video in the Last Year: Not on file  . Ran Out of Food in the Last Year: Not on file  Transportation Needs:   . Lack of Transportation (Medical): Not on file  . Lack of Transportation (Non-Medical): Not on file  Physical Activity:   . Days of Exercise per Week: Not on file  . Minutes of Exercise per Session: Not on file  Stress:   . Feeling of Stress : Not on file  Social Connections:   . Frequency of Communication with Friends and Family: Not on file  . Frequency of Social Gatherings with  Friends and Family: Not on file  . Attends Religious Services: Not on file  . Active Member of Clubs or Organizations: Not on file  . Attends Banker Meetings: Not on file  . Marital Status: Not on file    Outpatient Encounter Medications as of 09/14/2020  Medication Sig  . esomeprazole (NEXIUM) 40 MG capsule TAKE 1 CAPSULE(40 MG) BY MOUTH DAILY  . metoprolol succinate (TOPROL-XL) 25 MG 24 hr tablet TAKE 1 TABLET BY MOUTH DAILY  . montelukast (SINGULAIR) 10 MG tablet Take 1 tablet (10 mg total) by mouth at bedtime.  Fran Lowes (OCREVUS IV) Inject into the vein.  . valsartan-hydrochlorothiazide (DIOVAN-HCT) 320-25 MG tablet TAKE 1 TABLET BY MOUTH DAILY  . [DISCONTINUED] fluticasone furoate-vilanterol (BREO ELLIPTA) 100-25 MCG/INH AEPB Inhale 1 puff into the lungs daily.    Marland Kitchen albuterol (VENTOLIN HFA) 108 (90 Base) MCG/ACT inhaler Inhale 2 puffs into the lungs every 6 (six) hours as needed for wheezing or shortness of breath (cough). (Patient not taking: Reported on 09/14/2020)  . [DISCONTINUED] predniSONE (DELTASONE) 20 MG tablet Take 1 tablet (20 mg total) by mouth daily with breakfast.   No facility-administered encounter medications on file as of 09/14/2020.    Review of Systems  Constitutional: Negative for appetite change and unexpected weight change.  HENT: Negative for congestion and sinus pressure.   Respiratory: Positive for cough. Negative for chest tightness and shortness of breath.   Cardiovascular: Negative for chest pain, palpitations and leg swelling.  Gastrointestinal: Negative for abdominal pain, diarrhea, nausea and vomiting.  Genitourinary: Negative for difficulty urinating and dysuria.  Musculoskeletal: Negative for joint swelling and myalgias.  Skin: Negative for color change and rash.  Neurological: Negative for dizziness, light-headedness and headaches.  Psychiatric/Behavioral: Negative for agitation and dysphoric mood.       Objective:    Physical Exam Vitals reviewed.  Constitutional:      General: She is not in acute distress.    Appearance: Normal appearance.  HENT:     Head: Normocephalic and atraumatic.     Right Ear: External ear normal.     Left Ear: External ear normal.  Eyes:     General: No scleral icterus.       Right eye: No discharge.        Left eye: No discharge.     Conjunctiva/sclera: Conjunctivae normal.  Neck:     Thyroid: No thyromegaly.  Cardiovascular:     Rate and Rhythm: Normal rate and regular rhythm.  Pulmonary:     Effort: No respiratory distress.     Breath sounds: Normal breath sounds. No wheezing.  Abdominal:     General: Bowel sounds are normal.     Palpations: Abdomen is soft.     Tenderness: There is no abdominal tenderness.  Musculoskeletal:        General: No swelling or  tenderness.     Cervical back: Neck supple. No tenderness.  Lymphadenopathy:     Cervical: No cervical adenopathy.  Skin:    Findings: No erythema or rash.  Neurological:     Mental Status: She is alert.  Psychiatric:        Mood and Affect: Mood normal.        Behavior: Behavior normal.     BP 122/84   Pulse 68   Temp 98.4 F (36.9 C) (Oral)   Ht 5' 7.5" (1.715 m)   Wt 219 lb 9.6 oz (99.6 kg)   LMP 12/27/2000   SpO2  97%   BMI 33.89 kg/m  Wt Readings from Last 3 Encounters:  09/14/20 219 lb 9.6 oz (99.6 kg)  07/06/20 221 lb 3.2 oz (100.3 kg)  06/08/20 219 lb 9.6 oz (99.6 kg)     Lab Results  Component Value Date   WBC 6.9 09/14/2020   HGB 13.9 09/14/2020   HCT 42.0 09/14/2020   PLT 304.0 09/14/2020   GLUCOSE 83 09/14/2020   CHOL 197 09/14/2020   TRIG 65.0 09/14/2020   HDL 86.90 09/14/2020   LDLCALC 97 09/14/2020   ALT 11 09/14/2020   AST 13 09/14/2020   NA 141 09/14/2020   K 4.2 09/14/2020   CL 102 09/14/2020   CREATININE 0.81 09/14/2020   BUN 14 09/14/2020   CO2 33 (H) 09/14/2020   TSH 1.94 05/12/2020    DG Chest 2 View  Result Date: 06/09/2020 CLINICAL DATA:  64 year old female with a history of cough EXAM: CHEST - 2 VIEW COMPARISON:  08/13/2019 FINDINGS: Cardiomediastinal silhouette unchanged in size and contour. No pneumothorax. No pleural effusion. No confluent airspace disease. Coarsened interstitial markings similar to the prior. No displaced fracture.  Degenerative changes of the spine IMPRESSION: . negative for acute cardiopulmonary disease. Electronically Signed   By: Gilmer Mor D.O.   On: 06/09/2020 11:37       Assessment & Plan:   Problem List Items Addressed This Visit    Vitamin D deficiency    Follow vitamin D level.        Skin lesion    Left leg lesion.  Refer to dermatology.        Relevant Orders   Ambulatory referral to Dermatology   Multiple sclerosis (HCC) - Primary    On ocrevus.  Doing well.  Continue f/u with  neurlology.        Relevant Orders   CBC with Differential/Platelet (Completed)   Hypercholesterolemia    Low cholesterol diet and exercise.  Follow lipid panel.        Relevant Orders   Hepatic function panel (Completed)   Lipid panel (Completed)   GERD (gastroesophageal reflux disease)    Acid reflux controlled.  On nexium.  Follow.        Essential hypertension    On metoprolol and diovan/hctz.  Blood pressure doing well.  Follow pressures.  Follow metabolic panel.       Relevant Orders   Basic metabolic panel (Completed)   Cough    Acid reflux controlled.  Has seen pulmonary.  Breo and singulair  helped.  Prednisone - helped while on prednisone.  Using delsym at night.  Is some better, but still with persistent cough.  Has seen pulmonary. Discussed f/u with pulmonary to discuss further w/up or treatment.         Anemia    Follow cbc.           Dale Gotham, MD

## 2020-09-16 ENCOUNTER — Other Ambulatory Visit: Payer: Self-pay | Admitting: Internal Medicine

## 2020-09-16 ENCOUNTER — Other Ambulatory Visit: Payer: Self-pay | Admitting: Pulmonary Disease

## 2020-09-16 DIAGNOSIS — Z1231 Encounter for screening mammogram for malignant neoplasm of breast: Secondary | ICD-10-CM

## 2020-09-19 ENCOUNTER — Encounter: Payer: Self-pay | Admitting: Internal Medicine

## 2020-09-19 DIAGNOSIS — L989 Disorder of the skin and subcutaneous tissue, unspecified: Secondary | ICD-10-CM | POA: Insufficient documentation

## 2020-09-19 NOTE — Assessment & Plan Note (Signed)
Acid reflux controlled.  Has seen pulmonary.  Breo and singulair  helped.  Prednisone - helped while on prednisone.  Using delsym at night.  Is some better, but still with persistent cough.  Has seen pulmonary. Discussed f/u with pulmonary to discuss further w/up or treatment.

## 2020-09-19 NOTE — Assessment & Plan Note (Signed)
Follow cbc.  

## 2020-09-19 NOTE — Assessment & Plan Note (Signed)
Follow vitamin D level.  

## 2020-09-19 NOTE — Assessment & Plan Note (Signed)
On ocrevus.  Doing well.  Continue f/u with neurlology.

## 2020-09-19 NOTE — Assessment & Plan Note (Signed)
Left leg lesion.  Refer to dermatology.

## 2020-09-19 NOTE — Assessment & Plan Note (Signed)
On metoprolol and diovan/hctz.  Blood pressure doing well.  Follow pressures.  Follow metabolic panel.

## 2020-09-19 NOTE — Assessment & Plan Note (Signed)
Low cholesterol diet and exercise.  Follow lipid panel.   

## 2020-09-19 NOTE — Assessment & Plan Note (Signed)
Acid reflux controlled.  On nexium.  Follow.

## 2020-10-25 ENCOUNTER — Other Ambulatory Visit: Payer: Self-pay

## 2020-10-25 ENCOUNTER — Ambulatory Visit
Admission: RE | Admit: 2020-10-25 | Discharge: 2020-10-25 | Disposition: A | Discharge status: Home / Self Care | Payer: BC Managed Care – PPO | Source: Ambulatory Visit | Attending: Internal Medicine | Admitting: Internal Medicine

## 2020-10-25 DIAGNOSIS — Z1231 Encounter for screening mammogram for malignant neoplasm of breast: Secondary | ICD-10-CM | POA: Insufficient documentation

## 2020-12-07 ENCOUNTER — Encounter: Payer: Self-pay | Admitting: Internal Medicine

## 2020-12-07 ENCOUNTER — Telehealth (INDEPENDENT_AMBULATORY_CARE_PROVIDER_SITE_OTHER): Payer: BC Managed Care – PPO | Admitting: Internal Medicine

## 2020-12-07 VITALS — Ht 67.5 in | Wt 219.0 lb

## 2020-12-07 DIAGNOSIS — R059 Cough, unspecified: Secondary | ICD-10-CM | POA: Diagnosis not present

## 2020-12-07 DIAGNOSIS — G35 Multiple sclerosis: Secondary | ICD-10-CM | POA: Diagnosis not present

## 2020-12-07 MED ORDER — PREDNISONE 10 MG PO TABS: ORAL_TABLET | ORAL | 0 refills | Status: DC

## 2020-12-07 MED ORDER — AZITHROMYCIN 250 MG PO TABS: ORAL_TABLET | ORAL | 0 refills | Status: DC

## 2020-12-07 NOTE — Progress Notes (Signed)
Patient ID: Jackie White, female   DOB: [birth year], 65 y.o.   MRN: [id number]   Virtual Visit via video Note  This visit type was conducted due to national recommendations for restrictions regarding the COVID-19 pandemic (e.g. social distancing).  This format is felt to be most appropriate for this patient at this time.  All issues noted in this document were discussed and addressed.  No physical exam was performed (except for noted visual exam findings with Video Visits).   I connected with Celese Banner by a video enabled telemedicine application and verified that I am speaking with the correct person using two identifiers. Location patient: home Location provider: work Persons participating in the virtual visit: patient, provider  The limitations, risks, security and privacy concerns of performing an evaluation and management service by video and the availability of in person appointments have been discussed.  It has also been discussed with the patient that there may be a patient responsible charge related to this service. The patient expressed understanding and agreed to proceed.   Reason for visit: work in appt.   HPI: Work in for increased cough and congestion.  Has a history of chronic cough.  Sees pulmonary.  Using Breo.  Has albuterol if needed.  Saw pulmonary 07/06/20 - history of cough variant asthma.  Has a history of intermittent wheezing and cough.  Worse at night.  cxr clear.  Cardiac CT - clear lungs.  Taking singulair.  Over the last week (starting approximately 12/02/20 - noticed increased cough.  Felt like something in her throat/chest that she could not get out.  Occasionally productive.  No headache.  Runny nose.  Nasal congestion.  No body aches.  Does report deep congestion.  Some sob with going up stairs.  Using albuterol inhaler 2x/day.  No chest pain.  Started mucinex DM.  Using her breo.  No nausea or vomiting.  covid test 1/16 - negative.     ROS: See pertinent  positives and negatives per HPI.  Past Medical History:  Diagnosis Date   Abnormal Pap smear    Cervical disc disease    Cervical and lumbar disc disease   GERD (gastroesophageal reflux disease)    History of iron deficiency    Hypertension    Multiple sclerosis (HCC)    Personal history of colonic polyps    Reactive airway disease    Vitamin D deficiency     Past Surgical History:  Procedure Laterality Date   BREAST REDUCTION SURGERY     REDUCTION MAMMAPLASTY Bilateral 2002   TOTAL ABDOMINAL HYSTERECTOMY  2003   Right Oophrectomy    Family History  Problem Relation Age of Onset   Stroke Mother    Hypertension Mother    Heart disease Maternal Grandmother    Breast cancer Neg Hx     SOCIAL HX: reviewed.    Current Outpatient Medications:    albuterol (VENTOLIN HFA) 108 (90 Base) MCG/ACT inhaler, Inhale 2 puffs into the lungs every 6 (six) hours as needed for wheezing or shortness of breath (cough)., Disp: 18 g, Rfl: 6   azithromycin (ZITHROMAX) 250 MG tablet, Take 2 tablets x 1 day and then 1 tablet per day for four more days., Disp: 6 tablet, Rfl: 0   BREO ELLIPTA 100-25 MCG/INH AEPB, INHALE 1 PUFF INTO THE LUNGS DAILY, Disp: 60 each, Rfl: 11   esomeprazole (NEXIUM) 40 MG capsule, TAKE 1 CAPSULE(40 MG) BY MOUTH DAILY, Disp: 90 capsule, Rfl: 1   metoprolol succinate (  TOPROL-XL) 25 MG 24 hr tablet, TAKE 1 TABLET BY MOUTH DAILY, Disp: 90 tablet, Rfl: 1   montelukast (SINGULAIR) 10 MG tablet, Take 1 tablet (10 mg total) by mouth at bedtime., Disp: 30 tablet, Rfl: 11   Ocrelizumab (OCREVUS IV), Inject into the vein., Disp: , Rfl:    predniSONE (DELTASONE) 10 MG tablet, Take 4 tablets x 1 day and then decrease by 1/2 tablet per day until down to zero mg., Disp: 18 tablet, Rfl: 0   valsartan-hydrochlorothiazide (DIOVAN-HCT) 320-25 MG tablet, TAKE 1 TABLET BY MOUTH DAILY, Disp: 90 tablet, Rfl: 1  EXAM:  GENERAL: alert, oriented, appears well and in no  acute distress  HEENT: atraumatic, conjunttiva clear, no obvious abnormalities on inspection of external nose and ears  NECK: normal movements of the head and neck  LUNGS: on inspection no signs of respiratory distress, breathing rate appears normal, no obvious gross SOB, gasping or wheezing  CV: no obvious cyanosis  PSYCH/NEURO: pleasant and cooperative, no obvious depression or anxiety, speech and thought processing grossly intact  ASSESSMENT AND PLAN:  Discussed the following assessment and plan:  Problem List Items Addressed This Visit    Cough - Primary    Has a history of chronic cough and cough variant asthma.  With increased cough and congestion as outlined.  Home covid test negative.  Discussed possible covid.  Given worsening symptoms, will schedule for nasal swab.  Also check cxr.  Saline nasal spray and steroid nasal spray as directed.  Mucinex/robitussin DM as directed.  Continue Breo.  Has albuterol if needed.  Prednisone taper as directed.  Follow closely.  Call with update.  Discussed quarantine.       Relevant Orders   DG Chest 2 View (Completed)   COVID-19, Flu A+B and RSV (Completed)   Multiple sclerosis (HCC)    On ocrevus.  Stable.  Continue f/u with neurology.           I discussed the assessment and treatment plan with the patient. The patient was provided an opportunity to ask questions and all were answered. The patient agreed with the plan and demonstrated an understanding of the instructions.   The patient was advised to call back or seek an in-person evaluation if the symptoms worsen or if the condition fails to improve as anticipated.    Dale Altona, MD

## 2020-12-08 ENCOUNTER — Ambulatory Visit
Admission: RE | Admit: 2020-12-08 | Discharge: 2020-12-08 | Disposition: A | Discharge status: Home / Self Care | Payer: BC Managed Care – PPO | Source: Ambulatory Visit | Attending: Internal Medicine | Admitting: Internal Medicine

## 2020-12-08 ENCOUNTER — Encounter: Payer: Self-pay | Admitting: *Deleted

## 2020-12-08 ENCOUNTER — Other Ambulatory Visit: Payer: BC Managed Care – PPO

## 2020-12-08 DIAGNOSIS — R059 Cough, unspecified: Secondary | ICD-10-CM

## 2020-12-10 LAB — COVID-19, FLU A+B AND RSV
Influenza A, NAA: NOT DETECTED
Influenza B, NAA: NOT DETECTED
RSV, NAA: NOT DETECTED
SARS-CoV-2, NAA: NOT DETECTED

## 2020-12-12 ENCOUNTER — Encounter: Payer: Self-pay | Admitting: Internal Medicine

## 2020-12-12 NOTE — Assessment & Plan Note (Signed)
On ocrevus.  Stable.  Continue f/u with neurology.

## 2020-12-12 NOTE — Assessment & Plan Note (Signed)
Has a history of chronic cough and cough variant asthma.  With increased cough and congestion as outlined.  Home covid test negative.  Discussed possible covid.  Given worsening symptoms, will schedule for nasal swab.  Also check cxr.  Saline nasal spray and steroid nasal spray as directed.  Mucinex/robitussin DM as directed.  Continue Breo.  Has albuterol if needed.  Prednisone taper as directed.  Follow closely.  Call with update.  Discussed quarantine.

## 2021-01-04 ENCOUNTER — Ambulatory Visit: Payer: BC Managed Care – PPO | Admitting: Pulmonary Disease

## 2021-01-04 ENCOUNTER — Encounter: Payer: Self-pay | Admitting: Pulmonary Disease

## 2021-01-04 ENCOUNTER — Other Ambulatory Visit: Payer: Self-pay

## 2021-01-04 VITALS — BP 132/74 | HR 76 | Temp 97.0°F | Ht 67.5 in | Wt 223.0 lb

## 2021-01-04 DIAGNOSIS — R059 Cough, unspecified: Secondary | ICD-10-CM

## 2021-01-04 DIAGNOSIS — G35 Multiple sclerosis: Secondary | ICD-10-CM

## 2021-01-04 DIAGNOSIS — J40 Bronchitis, not specified as acute or chronic: Secondary | ICD-10-CM | POA: Diagnosis not present

## 2021-01-04 DIAGNOSIS — J45991 Cough variant asthma: Secondary | ICD-10-CM

## 2021-01-04 DIAGNOSIS — D7218 Eosinophilia in diseases classified elsewhere: Secondary | ICD-10-CM

## 2021-01-04 MED ORDER — BREO ELLIPTA 200-25 MCG/INH IN AEPB: 1.0000 | INHALATION_SPRAY | Freq: Every day | RESPIRATORY_TRACT | 0 refills | Status: AC

## 2021-01-04 MED ORDER — SPIRIVA RESPIMAT 1.25 MCG/ACT IN AERS
2.0000 | INHALATION_SPRAY | Freq: Every day | RESPIRATORY_TRACT | 0 refills | Status: DC
Start: 2021-01-04 — End: 2021-05-19

## 2021-01-04 NOTE — Patient Instructions (Signed)
We are giving you a trial of increased dose of Breo this will be 200/25, 2 inhalations daily in the evening.  Trial of Spiriva 1.25 mcg, 2 inhalations daily in the morning.  We are doing a CT chest mostly to look at your airways.  We're also repeating your breathing tests.  We have placed an order for a clearance device: Acapella flutter valve to see if this helps clearing your mucus.  We will see you in follow-up in 4 to 6 weeks time call sooner should any new problems arise.  Let us know how the inhalers are doing for you and we can send a new prescription to your pharmacy for them.

## 2021-01-04 NOTE — Progress Notes (Signed)
Subjective:    Patient ID: Jackie White, female    DOB: August 23, 1956, 65 y.o.   MRN: [id number]  Chief Complaint  Patient presents with  . Follow-up    C/o prod cough with thick yellow sputum and sob with exertion.    HPI  [name] is a 65 year old lifelong never smoker who presents here for follow-up of cough, tenacious sputum production with plugs and dyspnea on exertion.  Her care is complicated by use of Ocrevus to manage her multiple sclerosis.  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  He was seen by our nurse practitioner Rubye Oaks, NP on 06 July 2020.  She was noted at that time to have elevation on her eosinophils which had not been demonstrated before.  She did not like Trelegy Ellipta so she was switched back to [place].  She was rechallenged with Singulair and Zyrtec.  She unfortunately would not be a candidate for Biologics as she is on Ocrevus for her MS.  She presents for follow-up from that visit.  Clearance of her secretions.  They are very tenacious.  She continues to have significant cough.  She does not overtly complain of reflux symptoms.  DATA: 11/04/2019 PFTs: FEV1 2.14 L or 89% predicted FVC 2.51 L or 82% predicted, minimal response to bronchodilator (7% net change).  FEV1/FVC 85%, lung volumes normal.  Diffusion capacity normal by [facility]. .  Review of Systems Patient Active Problem List   Diagnosis Date Noted  . History of colon polyps 03/10/2021  . History of iron deficiency 03/10/2021  . PUD (peptic ulcer disease) 03/10/2021  . Aortic atherosclerosis (HCC) 01/24/2021  . Skin lesion 09/19/2020  . Cough variant asthma 11/19/2019  . Abnormal EKG 02/21/2019  . Family history of coronary artery disease 02/21/2019  . Cough 01/05/2019  . Shortness of breath 01/05/2019  . Wheezing 07/21/2018  . Impingement syndrome of left shoulder region 05/01/2017  . Osteoarthritis of knee 05/01/2017  . Trochanteric bursitis  of right hip 05/01/2017  . Left shoulder pain 04/24/2017  . Right knee pain 04/24/2017  . Hemorrhoids 08/02/2016  . Low back pain 05/17/2016  . Right hip pain 05/17/2016  . Chest tightness 03/01/2016  . Rash 01/31/2015  . Health care maintenance 01/31/2015  . Left knee pain 10/12/2013  . Pain in joint involving lower leg 10/12/2013  . GERD (gastroesophageal reflux disease) 12/29/2012  . Abnormal Pap smear of cervix 12/29/2012  . Essential hypertension 12/25/2012  . Anemia 12/25/2012  . Degeneration of lumbar or lumbosacral intervertebral disc 12/25/2012  . Hypercholesterolemia 12/25/2012  . Vitamin D deficiency 12/25/2012  . Multiple sclerosis (HCC) 12/25/2012   Current Meds  Medication Sig  . albuterol (VENTOLIN HFA) 108 (90 Base) MCG/ACT inhaler Inhale 2 puffs into the lungs every 6 (six) hours as needed for wheezing or shortness of breath (cough).  Marland Kitchen BREO ELLIPTA 100-25 MCG/INH AEPB INHALE 1 PUFF INTO THE LUNGS DAILY  . esomeprazole (NEXIUM) 40 MG capsule TAKE 1 CAPSULE(40 MG) BY MOUTH DAILY  . metoprolol succinate (TOPROL-XL) 25 MG 24 hr tablet TAKE 1 TABLET BY MOUTH DAILY  . montelukast (SINGULAIR) 10 MG tablet Take 1 tablet (10 mg total) by mouth at bedtime.  Fran Lowes (OCREVUS IV) Inject into the vein.  Marland Kitchen Olopatadine HCl 0.6 % SOLN Place 2 sprays into the nose in the morning and at bedtime.  . valsartan-hydrochlorothiazide (DIOVAN-HCT) 320-25 MG tablet TAKE 1 TABLET BY MOUTH DAILY  . [DISCONTINUED] azithromycin (ZITHROMAX)  250 MG tablet Take 2 tablets x 1 day and then 1 tablet per day for four more days.  . [DISCONTINUED] predniSONE (DELTASONE) 10 MG tablet Take 4 tablets x 1 day and then decrease by 1/2 tablet per day until down to zero mg.   Social History   Tobacco Use  . Smoking status: Never Smoker  . Smokeless tobacco: Never Used  Substance Use Topics  . Alcohol use: Yes    Alcohol/week: 0.0 standard drinks   Immunization History  Administered Date(s)  Administered  . Influenza Split 09/26/2012, 09/24/2013  . Influenza, High Dose Seasonal PF 09/08/2019  . Influenza,inj,Quad PF,6+ Mos 09/14/2020  . Influenza-Unspecified 08/27/2014, 09/21/2015, 09/20/2016, 09/10/2018  . PFIZER(Purple Top)SARS-COV-2 Vaccination 02/10/2020, 03/02/2020, 11/16/2020  . Zoster Recombinat (Shingrix) 09/11/2019        Objective:   Physical Exam BP 132/74 (BP Location: Left Arm, Cuff Size: Normal)   Pulse 76   Temp (!) 97 F (36.1 C) (Temporal)   Ht 5' 7.5" (1.715 m)   Wt 223 lb (101.2 kg)   LMP 12/27/2000   SpO2 100%   BMI 34.41 kg/m  GENERAL: Overweight woman, no acute distress.  Fully ambulatory.  No conversational dyspnea.  Occasional congested cough. HEAD: Normocephalic, atraumatic.  EYES: Pupils equal, round, reactive to light.  No scleral icterus.  MOUTH: Nose/mouth/throat not examined due to masking requirements for COVID 19. NECK: Supple. No thyromegaly. Trachea midline. No JVD.  No adenopathy. PULMONARY: Good air entry bilaterally.  She has faint end expiratory wheezes throughout today.   CARDIOVASCULAR: S1 and S2. Regular rate and rhythm.  No rubs, murmurs or gallops heard. ABDOMEN: Benign. MUSCULOSKELETAL: No joint deformity, no clubbing, no edema.  NEUROLOGIC: No focal deficit, no gait disturbance, speech is fluent. SKIN: Intact,warm,dry.  No rashes. PSYCH: Mood and behavior normal     Assessment & Plan:     ICD-10-CM   1. Cough variant asthma  J45.991    Increase Breo to 200/25, 1 puff daily Add Spiriva Samples provided for the patient  2. Cough  R05.9 Pulmonary Function Test [facility] Only    CT Chest Wo Contrast    AMB REFERRAL FOR DME   Likely related to cough variant asthma Cannot exclude interstitial disease CT chest  3. Eosinophilic bronchitis  J40    D72.18    Increasing Breo dose as above  4. Multiple sclerosis (HCC)  G35    This issue adds complexity to her management She is on Ocrevus which can exacerbate respiratory  symptoms   Orders Placed This Encounter  Procedures  . CT Chest Wo Contrast    Standing Status:   Future    Number of Occurrences:   1    Standing Expiration Date:   01/04/2022    Scheduling Instructions:     Next available.    Order Specific Question:   Preferred imaging location?    Answer:   [facility]  . AMB REFERRAL FOR DME    Referral Priority:   Routine    Referral Type:   Durable Medical Equipment Purchase    Number of Visits Requested:   1  . Pulmonary Function Test [facility] Only    Standing Status:   Future    Number of Occurrences:   1    Standing Expiration Date:   01/04/2022    Scheduling Instructions:     4-6wk    Order Specific Question:   Full PFT: includes the following: basic spirometry, spirometry pre & post bronchodilator, diffusion  capacity (DLCO), lung volumes    Answer:   Full PFT   Meds ordered this encounter  Medications  . fluticasone furoate-vilanterol (BREO ELLIPTA) 200-25 MCG/INH AEPB    Sig: Inhale 1 puff into the lungs daily for 1 day.    Dispense:  14 each    Refill:  0    Order Specific Question:   Lot Number?    Answer:   Aline August    Order Specific Question:   Expiration Date?    Answer:   07/21/2021    Order Specific Question:   Manufacturer?    Answer:   [employer] [12]    Order Specific Question:   Quantity    Answer:   2  . Tiotropium Bromide Monohydrate (SPIRIVA RESPIMAT) 1.25 MCG/ACT AERS    Sig: Inhale 2 puffs into the lungs daily.    Dispense:  8 g    Refill:  0    Order Specific Question:   Lot Number?    Answer:   [id number]    Order Specific Question:   Expiration Date?    Answer:   11/20/2021    Order Specific Question:   Quantity    Answer:   2   We will proceed with testing as above.  Most recently she has not shown eosinophilia which has been a new issue.  Have increased Breo to see if this helps control her symptoms.  She is unfortunately not a candidate for Biologics due to her Ocrevus therapy for MS.  Tenacious  secretions and have ordered a flutter valve for her to utilize to help with secretion clearance.  We will see her in follow-up in 4 to 6 weeks time she is to contact us prior to that time should any new difficulties arise.  Gailen Shelter, MD [facility]   *This note was dictated using voice recognition software/Dragon.  Despite best efforts to proofread, errors can occur which can change the meaning.  Any change was purely unintentional.

## 2021-01-17 ENCOUNTER — Other Ambulatory Visit: Payer: Self-pay

## 2021-01-17 ENCOUNTER — Ambulatory Visit: Payer: BC Managed Care – PPO | Admitting: Internal Medicine

## 2021-01-17 ENCOUNTER — Encounter: Payer: Self-pay | Admitting: Internal Medicine

## 2021-01-17 DIAGNOSIS — I10 Essential (primary) hypertension: Secondary | ICD-10-CM

## 2021-01-17 DIAGNOSIS — E7800 Pure hypercholesterolemia, unspecified: Secondary | ICD-10-CM

## 2021-01-17 DIAGNOSIS — D649 Anemia, unspecified: Secondary | ICD-10-CM

## 2021-01-17 DIAGNOSIS — K219 Gastro-esophageal reflux disease without esophagitis: Secondary | ICD-10-CM

## 2021-01-17 DIAGNOSIS — R062 Wheezing: Secondary | ICD-10-CM | POA: Diagnosis not present

## 2021-01-17 DIAGNOSIS — R059 Cough, unspecified: Secondary | ICD-10-CM

## 2021-01-17 DIAGNOSIS — G35 Multiple sclerosis: Secondary | ICD-10-CM

## 2021-01-17 NOTE — Progress Notes (Signed)
Patient ID: EISLEY BARBER, female   DOB: Jul 19, 1956, 65 y.o.   MRN: [id number]   Subjective:    Patient ID: [name], female    DOB: Jan 07, 1956, 65 y.o.   MRN: [id number]  HPI This visit occurred during the SARS-CoV-2 public health emergency.  Safety protocols were in place, including screening questions prior to the visit, additional usage of staff PPE, and extensive cleaning of exam room while observing appropriate contact time as indicated for disinfecting solutions.  Patient here for a scheduled follow up.  Here to follow up regarding persistent cough.  Seeing pulmonary.  Just evaluated.  Planning for CT tomorrow and scheduled for PFTs.  Was started on spiriva.  Does not like spiriva. Was given flutter valve.  Has breo.  Still with persistent cough.  Trial of inhalers, antihistamine, PPI - has not seemed to make a big difference.  No chest pain.  Breathing overall stable.  No abdominal pain.  Bowels moving.  ocrevus infusion due next month.  She plans to discuss with her neurologist regarding the question if her symptoms could be related to her MS medication.  Has lost weight.   Past Medical History:  Diagnosis Date  . Abnormal Pap smear   . Cervical disc disease    Cervical and lumbar disc disease  . GERD (gastroesophageal reflux disease)   . History of iron deficiency   . Hypertension   . Multiple sclerosis (HCC)   . Personal history of colonic polyps   . Reactive airway disease   . Vitamin D deficiency    Past Surgical History:  Procedure Laterality Date  . BREAST REDUCTION SURGERY    . REDUCTION MAMMAPLASTY Bilateral 2002  . TOTAL ABDOMINAL HYSTERECTOMY  2003   Right Oophrectomy   Family History  Problem Relation Age of Onset  . Stroke Mother   . Hypertension Mother   . Heart disease Maternal Grandmother   . Breast cancer Neg Hx    Social History   Socioeconomic History  . Marital status: Married    Spouse name: Not on file  . Number of children: Not on file   . Years of education: Not on file  . Highest education level: Not on file  Occupational History  . Not on file  Tobacco Use  . Smoking status: Never Smoker  . Smokeless tobacco: Never Used  Vaping Use  . Vaping Use: Never used  Substance and Sexual Activity  . Alcohol use: Yes    Alcohol/week: 0.0 standard drinks  . Drug use: No  . Sexual activity: Not on file  Other Topics Concern  . Not on file  Social History Narrative  . Not on file   Social Determinants of Health   Financial Resource Strain: Not on file  Food Insecurity: Not on file  Transportation Needs: Not on file  Physical Activity: Not on file  Stress: Not on file  Social Connections: Not on file    Outpatient Encounter Medications as of 01/17/2021  Medication Sig  . albuterol (VENTOLIN HFA) 108 (90 Base) MCG/ACT inhaler Inhale 2 puffs into the lungs every 6 (six) hours as needed for wheezing or shortness of breath (cough).  Marland Kitchen BREO ELLIPTA 100-25 MCG/INH AEPB INHALE 1 PUFF INTO THE LUNGS DAILY  . esomeprazole (NEXIUM) 40 MG capsule TAKE 1 CAPSULE(40 MG) BY MOUTH DAILY  . metoprolol succinate (TOPROL-XL) 25 MG 24 hr tablet TAKE 1 TABLET BY MOUTH DAILY  . montelukast (SINGULAIR) 10 MG tablet Take 1 tablet (10  mg total) by mouth at bedtime.  Fran Lowes (OCREVUS IV) Inject into the vein.  Marland Kitchen Olopatadine HCl 0.6 % SOLN Place 2 sprays into the nose in the morning and at bedtime.  . Tiotropium Bromide Monohydrate (SPIRIVA RESPIMAT) 1.25 MCG/ACT AERS Inhale 2 puffs into the lungs daily.  . valsartan-hydrochlorothiazide (DIOVAN-HCT) 320-25 MG tablet TAKE 1 TABLET BY MOUTH DAILY   No facility-administered encounter medications on file as of 01/17/2021.    Review of Systems  Constitutional: Negative for appetite change and unexpected weight change.  HENT: Negative for congestion and sinus pressure.   Respiratory: Positive for cough and wheezing. Negative for chest tightness and shortness of breath.   Cardiovascular:  Negative for chest pain, palpitations and leg swelling.  Gastrointestinal: Negative for abdominal pain, diarrhea, nausea and vomiting.  Genitourinary: Negative for difficulty urinating and dysuria.  Musculoskeletal: Negative for joint swelling and myalgias.  Skin: Negative for color change and rash.  Neurological: Negative for dizziness, light-headedness and headaches.  Psychiatric/Behavioral: Negative for agitation and dysphoric mood.       Objective:    Physical Exam Vitals reviewed.  Constitutional:      General: She is not in acute distress.    Appearance: Normal appearance.  HENT:     Head: Normocephalic and atraumatic.     Right Ear: External ear normal.     Left Ear: External ear normal.     Mouth/Throat:     Mouth: Oropharynx is clear and moist.  Eyes:     General: No scleral icterus.       Right eye: No discharge.        Left eye: No discharge.     Conjunctiva/sclera: Conjunctivae normal.  Neck:     Thyroid: No thyromegaly.  Cardiovascular:     Rate and Rhythm: Normal rate and regular rhythm.  Pulmonary:     Effort: No respiratory distress.     Breath sounds: Normal breath sounds. No wheezing.  Abdominal:     General: Bowel sounds are normal.     Palpations: Abdomen is soft.     Tenderness: There is no abdominal tenderness.  Musculoskeletal:        General: No swelling, tenderness or edema.     Cervical back: Neck supple. No tenderness.  Lymphadenopathy:     Cervical: No cervical adenopathy.  Skin:    Findings: No erythema or rash.  Neurological:     Mental Status: She is alert.  Psychiatric:        Mood and Affect: Mood normal.        Behavior: Behavior normal.     BP 128/78   Pulse 69   Temp 97.9 F (36.6 C) (Oral)   Resp 16   Ht 5\' 8"  (1.727 m)   Wt 208 lb (94.3 kg)   LMP 12/27/2000   SpO2 98%   BMI 31.63 kg/m  Wt Readings from Last 3 Encounters:  01/17/21 208 lb (94.3 kg)  01/04/21 223 lb (101.2 kg)  12/07/20 219 lb (99.3 kg)      Lab Results  Component Value Date   WBC 6.9 09/14/2020   HGB 13.9 09/14/2020   HCT 42.0 09/14/2020   PLT 304.0 09/14/2020   GLUCOSE 83 09/14/2020   CHOL 197 09/14/2020   TRIG 65.0 09/14/2020   HDL 86.90 09/14/2020   LDLCALC 97 09/14/2020   ALT 11 09/14/2020   AST 13 09/14/2020   NA 141 09/14/2020   K 4.2 09/14/2020   CL 102 09/14/2020  CREATININE 0.81 09/14/2020   BUN 14 09/14/2020   CO2 33 (H) 09/14/2020   TSH 1.94 05/12/2020    DG Chest 2 View  Result Date: 12/08/2020 CLINICAL DATA:  Cough and congestion. EXAM: CHEST - 2 VIEW COMPARISON:  June 08, 2020 FINDINGS: The heart size and mediastinal contours are within normal limits. Both lungs are clear. Multilevel degenerative changes seen throughout the thoracic spine. Mild levoscoliosis of the lower thoracic spine is also noted. IMPRESSION: Stable exam, without active cardiopulmonary disease. Electronically Signed   By: Aram Candela M.D.   On: 12/08/2020 23:22       Assessment & Plan:   Problem List Items Addressed This Visit    Anemia    Follow cbc.       Cough    History of chronic cough and documented question of cough variant asthma.  Has tried various inhalers.  Does not appear to help.  Treating acid reflux. Has tried trial of antihistamines and nasal spray.  Still persistent symptoms.  Has CT chest planned for tomorrow.  Scheduled for PFTs.        Essential hypertension    Blood pressure doing well.  Continue metoprolol and diovan/hctz.  Follow pressures.  Follow metabolic panel.       GERD (gastroesophageal reflux disease)    Reports no notice of acid reflux.  No nexium.  Follow.       Hypercholesterolemia    Low cholesterol diet and exercise.  Follow lipid panel.       Multiple sclerosis (HCC)    Receiving ocrevus infusions.  Has been stable on this medication.  Has f/u with neurology next month.  Plans to discuss.        Wheezing    Persistent intermittent cough and wheezing.  Seeing  pulmonary. Trial of inhalers as outlined.  Has breo.  Did not like spiriva.  Planning for CT tomorrow.  PFTs scheduled.  Follow.            Dale Lilburn, MD

## 2021-01-18 ENCOUNTER — Ambulatory Visit
Admission: RE | Admit: 2021-01-18 | Discharge: 2021-01-18 | Disposition: A | Discharge status: Home / Self Care | Payer: BC Managed Care – PPO | Source: Ambulatory Visit | Attending: Pulmonary Disease | Admitting: Pulmonary Disease

## 2021-01-18 DIAGNOSIS — R059 Cough, unspecified: Secondary | ICD-10-CM | POA: Insufficient documentation

## 2021-01-19 ENCOUNTER — Other Ambulatory Visit
Admission: RE | Admit: 2021-01-19 | Discharge: 2021-01-19 | Disposition: A | Discharge status: Home / Self Care | Payer: BC Managed Care – PPO | Source: Ambulatory Visit | Attending: Pulmonary Disease | Admitting: Pulmonary Disease

## 2021-01-19 ENCOUNTER — Other Ambulatory Visit: Payer: Self-pay

## 2021-01-19 DIAGNOSIS — Z01812 Encounter for preprocedural laboratory examination: Secondary | ICD-10-CM | POA: Insufficient documentation

## 2021-01-19 DIAGNOSIS — Z20822 Contact with and (suspected) exposure to covid-19: Secondary | ICD-10-CM | POA: Insufficient documentation

## 2021-01-19 DIAGNOSIS — K219 Gastro-esophageal reflux disease without esophagitis: Secondary | ICD-10-CM

## 2021-01-19 LAB — SARS CORONAVIRUS 2 (TAT 6-24 HRS): SARS Coronavirus 2: NEGATIVE

## 2021-01-20 ENCOUNTER — Encounter: Payer: Self-pay | Admitting: Internal Medicine

## 2021-01-20 ENCOUNTER — Ambulatory Visit: Payer: BC Managed Care – PPO | Attending: Pulmonary Disease

## 2021-01-20 DIAGNOSIS — R059 Cough, unspecified: Secondary | ICD-10-CM | POA: Diagnosis present

## 2021-01-20 LAB — PULMONARY FUNCTION TEST ARMC ONLY
DL/VA % pred: 98 %
DL/VA: 4.02 ml/min/mmHg/L
DLCO unc % pred: 77 %
DLCO unc: 17.52 ml/min/mmHg
FEF 25-75 Post: 2.02 L/sec
FEF 25-75 Pre: 1.4 L/sec
FEF2575-%Change-Post: 44 %
FEF2575-%Pred-Post: 89 %
FEF2575-%Pred-Pre: 62 %
FEV1-%Change-Post: 10 %
FEV1-%Pred-Post: 73 %
FEV1-%Pred-Pre: 66 %
FEV1-Post: 1.73 L
FEV1-Pre: 1.56 L
FEV1FVC-%Change-Post: 4 %
FEV1FVC-%Pred-Pre: 97 %
FEV6-%Change-Post: 5 %
FEV6-%Pred-Post: 73 %
FEV6-%Pred-Pre: 69 %
FEV6-Post: 2.14 L
FEV6-Pre: 2.03 L
FEV6FVC-%Pred-Post: 103 %
FEV6FVC-%Pred-Pre: 103 %
FVC-%Change-Post: 5 %
FVC-%Pred-Post: 71 %
FVC-%Pred-Pre: 67 %
FVC-Post: 2.14 L
Post FEV1/FVC ratio: 81 %
Post FEV6/FVC ratio: 100 %
Pre FEV1/FVC ratio: 77 %
Pre FEV6/FVC Ratio: 100 %
RV % pred: 99 %
RV: 2.2 L
TLC % pred: 81 %
TLC: 4.52 L

## 2021-01-20 MED ORDER — ALBUTEROL SULFATE (2.5 MG/3ML) 0.083% IN NEBU
2.5000 mg | INHALATION_SOLUTION | Freq: Once | RESPIRATORY_TRACT | Status: AC
  Administered 2021-01-20: 2.5 mg via RESPIRATORY_TRACT
  Filled 2021-01-20: qty 3

## 2021-01-20 NOTE — Telephone Encounter (Signed)
This is not uncommon that we see this on a scan.  We work on risk factor modification.  Can schedule an appt to discuss further treatment.  Ok to schedule virtual if desires.

## 2021-01-23 ENCOUNTER — Encounter: Payer: Self-pay | Admitting: Internal Medicine

## 2021-01-23 NOTE — Assessment & Plan Note (Signed)
Follow cbc.  

## 2021-01-23 NOTE — Assessment & Plan Note (Signed)
Low cholesterol diet and exercise.  Follow lipid panel.   

## 2021-01-23 NOTE — Assessment & Plan Note (Signed)
History of chronic cough and documented question of cough variant asthma.  Has tried various inhalers.  Does not appear to help.  Treating acid reflux. Has tried trial of antihistamines and nasal spray.  Still persistent symptoms.  Has CT chest planned for tomorrow.  Scheduled for PFTs.

## 2021-01-23 NOTE — Assessment & Plan Note (Signed)
Receiving ocrevus infusions.  Has been stable on this medication.  Has f/u with neurology next month.  Plans to discuss.

## 2021-01-23 NOTE — Assessment & Plan Note (Signed)
Blood pressure doing well.  Continue metoprolol and diovan/hctz.  Follow pressures.  Follow metabolic panel.

## 2021-01-23 NOTE — Assessment & Plan Note (Signed)
Reports no notice of acid reflux.  No nexium.  Follow.

## 2021-01-23 NOTE — Assessment & Plan Note (Signed)
Persistent intermittent cough and wheezing.  Seeing pulmonary. Trial of inhalers as outlined.  Has breo.  Did not like spiriva.  Planning for CT tomorrow.  PFTs scheduled.  Follow.

## 2021-01-24 ENCOUNTER — Encounter: Payer: Self-pay | Admitting: Internal Medicine

## 2021-01-24 ENCOUNTER — Telehealth (INDEPENDENT_AMBULATORY_CARE_PROVIDER_SITE_OTHER): Payer: BC Managed Care – PPO | Admitting: Internal Medicine

## 2021-01-24 ENCOUNTER — Encounter: Payer: Self-pay | Admitting: *Deleted

## 2021-01-24 DIAGNOSIS — G35 Multiple sclerosis: Secondary | ICD-10-CM

## 2021-01-24 DIAGNOSIS — K219 Gastro-esophageal reflux disease without esophagitis: Secondary | ICD-10-CM

## 2021-01-24 DIAGNOSIS — E7800 Pure hypercholesterolemia, unspecified: Secondary | ICD-10-CM | POA: Diagnosis not present

## 2021-01-24 DIAGNOSIS — I700 Atherosclerosis of aorta: Secondary | ICD-10-CM | POA: Diagnosis not present

## 2021-01-24 MED ORDER — ROSUVASTATIN CALCIUM 5 MG PO TABS
ORAL_TABLET | ORAL | 1 refills | Status: DC
Start: 2021-01-24 — End: 2021-07-20

## 2021-01-24 NOTE — Progress Notes (Signed)
Patient ID: Jackie White, female   DOB: 1955-12-10, 65 y.o.   MRN: [id number]   Virtual Visit via video Note  This visit type was conducted due to national recommendations for restrictions regarding the COVID-19 pandemic (e.g. social distancing).  This format is felt to be most appropriate for this patient at this time.  All issues noted in this document were discussed and addressed.  No physical exam was performed (except for noted visual exam findings with Video Visits).   I connected with Kathyann Spaugh by a video enabled telemedicine application and verified that I am speaking with the correct person using two identifiers. Location patient: home Location provider: work  Persons participating in the virtual visit: patient, provider  The limitations, risks, security and privacy concerns of performing an evaluation and management service by video and the availability of in person appointments have been discussed.  It has also been discussed with the patient that there may be a patient responsible charge related to this service. The patient expressed understanding and agreed to proceed.   Reason for visit: work in appt  HPI: Work in appt to discuss her recent CT scan - specifically aortic atherosclerosis.  Found on recent CT scan ordered by pulmonary to evaluate persistent cough.  CT revealed esophageal air fluid level - suggests dysmotility or gastroesophageal reflux and aortic atherosclerosis.  Discussed aortic atherosclerosis.  Discuss risk factor modification.  Discussed starting a statin medication.  Questions answered.  Discussed starting low dose a few days per week to see if tolerates.  No chest pain reported.  Breathing stable.  Persistent cough.  No abdominal pain.    ROS: See pertinent positives and negatives per HPI.  Past Medical History:  Diagnosis Date  . Abnormal Pap smear   . Cervical disc disease    Cervical and lumbar disc disease  . GERD (gastroesophageal reflux  disease)   . History of iron deficiency   . Hypertension   . Multiple sclerosis (HCC)   . Personal history of colonic polyps   . Reactive airway disease   . Vitamin D deficiency     Past Surgical History:  Procedure Laterality Date  . BREAST REDUCTION SURGERY    . REDUCTION MAMMAPLASTY Bilateral 2002  . TOTAL ABDOMINAL HYSTERECTOMY  2003   Right Oophrectomy    Family History  Problem Relation Age of Onset  . Stroke Mother   . Hypertension Mother   . Heart disease Maternal Grandmother   . Breast cancer Neg Hx     SOCIAL HX: reviewed.    Current Outpatient Medications:  .  rosuvastatin (CRESTOR) 5 MG tablet, Take one tablet q Monday, Wednesday and Friday., Disp: 39 tablet, Rfl: 1 .  albuterol (VENTOLIN HFA) 108 (90 Base) MCG/ACT inhaler, Inhale 2 puffs into the lungs every 6 (six) hours as needed for wheezing or shortness of breath (cough)., Disp: 18 g, Rfl: 6 .  BREO ELLIPTA 100-25 MCG/INH AEPB, INHALE 1 PUFF INTO THE LUNGS DAILY, Disp: 60 each, Rfl: 11 .  esomeprazole (NEXIUM) 40 MG capsule, TAKE 1 CAPSULE(40 MG) BY MOUTH DAILY, Disp: 90 capsule, Rfl: 1 .  metoprolol succinate (TOPROL-XL) 25 MG 24 hr tablet, TAKE 1 TABLET BY MOUTH DAILY, Disp: 90 tablet, Rfl: 1 .  montelukast (SINGULAIR) 10 MG tablet, Take 1 tablet (10 mg total) by mouth at bedtime., Disp: 30 tablet, Rfl: 11 .  Ocrelizumab (OCREVUS IV), Inject into the vein., Disp: , Rfl:  .  Olopatadine HCl 0.6 % SOLN, Place 2  sprays into the nose in the morning and at bedtime., Disp: , Rfl:  .  Tiotropium Bromide Monohydrate (SPIRIVA RESPIMAT) 1.25 MCG/ACT AERS, Inhale 2 puffs into the lungs daily., Disp: 8 g, Rfl: 0 .  valsartan-hydrochlorothiazide (DIOVAN-HCT) 320-25 MG tablet, TAKE 1 TABLET BY MOUTH DAILY, Disp: 90 tablet, Rfl: 1  EXAM:  GENERAL: alert, oriented, appears well and in no acute distress  HEENT: atraumatic, conjunttiva clear, no obvious abnormalities on inspection of external nose and ears  NECK: normal  movements of the head and neck  LUNGS: on inspection no signs of respiratory distress, breathing rate appears normal, no obvious gross SOB, gasping or wheezing  CV: no obvious cyanosis  PSYCH/NEURO: pleasant and cooperative, no obvious depression or anxiety, speech and thought processing grossly intact  ASSESSMENT AND PLAN:  Discussed the following assessment and plan:  Problem List Items Addressed This Visit    Aortic atherosclerosis (HCC)    The 10-year ASCVD risk score Denman George DC Jr., et al., 2013) is: 6.2%   Values used to calculate the score:     Age: 3 years     Sex: Female     Is Non-Hispanic African American: Yes     Diabetic: No     Tobacco smoker: No     Systolic Blood Pressure: 128 mmHg     Is BP treated: Yes     HDL Cholesterol: 86.9 mg/dL     Total Cholesterol: 197 mg/dL  Given finding of aortic atherosclerosis on CT scan, start crestor as outlined.  Follow.       Relevant Medications   rosuvastatin (CRESTOR) 5 MG tablet   GERD (gastroesophageal reflux disease)    CT as outlined.  Being referred to GI for further evaluation.       Hypercholesterolemia    Given aortic atherosclerosis noted on CT scan, it was decided to start crest.  Will start low dose 3 days per week.  Follow lipid panel and liver function tests.       Relevant Medications   rosuvastatin (CRESTOR) 5 MG tablet   Multiple sclerosis (HCC)    Receiving ocrevus infusions.           I discussed the assessment and treatment plan with the patient. The patient was provided an opportunity to ask questions and all were answered. The patient agreed with the plan and demonstrated an understanding of the instructions.   The patient was advised to call back or seek an in-person evaluation if the symptoms worsen or if the condition fails to improve as anticipated.    Dale Dunn Loring, MD

## 2021-01-24 NOTE — Assessment & Plan Note (Addendum)
The 10-year ASCVD risk score Denman George DC Montez Hageman., et al., 2013) is: 6.2%   Values used to calculate the score:     Age: 65 years     Sex: Female     Is Non-Hispanic African American: Yes     Diabetic: No     Tobacco smoker: No     Systolic Blood Pressure: 128 mmHg     Is BP treated: Yes     HDL Cholesterol: 86.9 mg/dL     Total Cholesterol: 197 mg/dL  Given finding of aortic atherosclerosis on CT scan, start crestor as outlined.  Follow.

## 2021-01-26 ENCOUNTER — Encounter: Payer: Self-pay | Admitting: Internal Medicine

## 2021-01-26 DIAGNOSIS — E876 Hypokalemia: Secondary | ICD-10-CM

## 2021-01-26 DIAGNOSIS — E7800 Pure hypercholesterolemia, unspecified: Secondary | ICD-10-CM

## 2021-01-28 NOTE — Telephone Encounter (Signed)
LMTCB

## 2021-01-28 NOTE — Telephone Encounter (Signed)
Please call and notify pt that I reviewed her labs and her potassium was slightly decreased. Inform her and send info on foods with increased potassium.  We will follow.  I would like to recheck her potassium and also check her cholesterol - fasting lab appt in the next 10-14 days.

## 2021-01-30 ENCOUNTER — Encounter: Payer: Self-pay | Admitting: Internal Medicine

## 2021-01-30 NOTE — Assessment & Plan Note (Signed)
CT as outlined.  Being referred to GI for further evaluation.

## 2021-01-30 NOTE — Assessment & Plan Note (Signed)
Given aortic atherosclerosis noted on CT scan, it was decided to start crest.  Will start low dose 3 days per week.  Follow lipid panel and liver function tests.

## 2021-01-30 NOTE — Assessment & Plan Note (Signed)
Receiving ocrevus infusions.

## 2021-02-02 NOTE — Telephone Encounter (Signed)
Spoke with pt. Appt has been rescheduled.

## 2021-02-10 ENCOUNTER — Other Ambulatory Visit: Payer: Self-pay

## 2021-02-10 ENCOUNTER — Other Ambulatory Visit (INDEPENDENT_AMBULATORY_CARE_PROVIDER_SITE_OTHER): Payer: BC Managed Care – PPO

## 2021-02-10 DIAGNOSIS — E876 Hypokalemia: Secondary | ICD-10-CM

## 2021-02-10 DIAGNOSIS — E7800 Pure hypercholesterolemia, unspecified: Secondary | ICD-10-CM | POA: Diagnosis not present

## 2021-02-10 LAB — LIPID PANEL
Cholesterol: 147 mg/dL (ref 0–200)
HDL: 76.9 mg/dL (ref >39.00)
LDL Cholesterol: 57 mg/dL (ref 0–99)
NonHDL: 70.34
Total CHOL/HDL Ratio: 2
Triglycerides: 67 mg/dL (ref 0.0–149.0)
VLDL: 13.4 mg/dL (ref 0.0–40.0)

## 2021-02-10 LAB — POTASSIUM: Potassium: 4.3 mEq/L (ref 3.5–5.1)

## 2021-02-11 ENCOUNTER — Other Ambulatory Visit: Payer: BC Managed Care – PPO

## 2021-02-14 ENCOUNTER — Encounter: Payer: Self-pay | Admitting: Pulmonary Disease

## 2021-02-14 ENCOUNTER — Ambulatory Visit: Payer: BC Managed Care – PPO | Admitting: Pulmonary Disease

## 2021-02-14 ENCOUNTER — Other Ambulatory Visit: Payer: Self-pay

## 2021-02-14 VITALS — BP 120/80 | HR 87 | Temp 97.0°F | Ht 67.5 in | Wt 218.8 lb

## 2021-02-14 DIAGNOSIS — J45991 Cough variant asthma: Secondary | ICD-10-CM | POA: Diagnosis not present

## 2021-02-14 DIAGNOSIS — G35 Multiple sclerosis: Secondary | ICD-10-CM

## 2021-02-14 DIAGNOSIS — K219 Gastro-esophageal reflux disease without esophagitis: Secondary | ICD-10-CM

## 2021-02-14 MED ORDER — BREO ELLIPTA 200-25 MCG/INH IN AEPB: 1.0000 | INHALATION_SPRAY | Freq: Every day | RESPIRATORY_TRACT | 11 refills | Status: DC

## 2021-02-14 MED ORDER — FLUTICASONE FUROATE-VILANTEROL 200-25 MCG/INH IN AEPB: 1.0000 | INHALATION_SPRAY | Freq: Every day | RESPIRATORY_TRACT | Status: DC

## 2021-02-14 MED ORDER — ALBUTEROL SULFATE HFA 108 (90 BASE) MCG/ACT IN AERS: 2.0000 | INHALATION_SPRAY | Freq: Four times a day (QID) | RESPIRATORY_TRACT | 6 refills | Status: DC | PRN

## 2021-02-14 MED ORDER — BREO ELLIPTA 200-25 MCG/INH IN AEPB: 1.0000 | INHALATION_SPRAY | Freq: Every day | RESPIRATORY_TRACT | 0 refills | Status: DC

## 2021-02-14 NOTE — Progress Notes (Signed)
Subjective:    Patient ID: Jackie White, female    DOB: 05-21-1956, 65 y.o.   MRN: [id number] Chief Complaint  Patient presents with   Follow-up    Review CT and PFT--cough has improved but is still present- prod cough with yellow sputum.    HPI Jackie White is 65 year old lifelong never smoker who presents here for follow-up of cough, tenacious sputum production and dyspnea on exertion.  This is a scheduled visit.  She was last evaluated on 04 January 2021.  Her symptoms are likely aggravated by the use of Ocrevus for multiple sclerosis.  Unfortunately she has not been inclined to try a.  Off of the Ocrevus for fear of an MS exacerbation.  She does relatively well with Breo Ellipta 200/25 feels that this is what has helped her the most.  She has been on Spiriva Respimat trial of 1.25 mcg 2 puffs daily and is unsure if this is offering any significant relief.  She does note that she is doing somewhat better than previously.  PFTs are as noted below.  She has a mild restrictive physiology likely related to weight.  She does however have significant small airways component with bronchodilator response.  This is consistent with asthma.  CT chest has not shown any parenchymal abnormalities she does have some evidence of potential esophageal dysmotility with potential reflux.  She is on PPI therapy.  She has not had any fevers, chills or sweats.  No purulent sputum production and no hemoptysis.  Sputum is for the most part grayish to white.  This can tend to be tenacious and have plugs.  No other symptoms endorsed today.  DATA: 04/24/2019 2D echo: LVEF 60 to 65% mild diastolic dysfunction, mild increased LA size.  Moderate tricuspid regurg. 11/04/2019 PFTs: FEV1 2.14 L or 89% predicted FVC 2.51 L or 82% predicted, minimal response to bronchodilator (7% net change).  FEV1/FVC 85%, lung volumes normal.  Diffusion capacity normal by Kco. 01/19/2021 CT chest: No lung parenchymal abnormalities.  No mediastinal  adenopathy.  Esophageal air-fluid levels suggestive of dysmotility and/or reflux. 01/20/2021 PFTs: FEV1 1.56 L or 66% predicted, FVC 2.03 L or 67% predicted, FEV1/FVC 77%.  Mild restriction likely due to obesity (ERV 23%), significant small airways component with bronchodilator reversibility.  Normal diffusion capacity by Kco.  Consistent with airways reactivity/asthma with concomitant restrictive physiology due to obesity  Review of Systems A 10 point review of systems was performed and it is as noted above otherwise negative.  Patient Active Problem List   Diagnosis Date Noted   Aortic atherosclerosis (HCC) 01/24/2021   Skin lesion 09/19/2020   Cough variant asthma 11/19/2019   Abnormal EKG 02/21/2019   Family history of coronary artery disease 02/21/2019   Cough 01/05/2019   Shortness of breath 01/05/2019   Wheezing 07/21/2018   Left shoulder pain 04/24/2017   Right knee pain 04/24/2017   Hemorrhoids 08/02/2016   Low back pain 05/17/2016   Right hip pain 05/17/2016   Chest tightness 03/01/2016   Rash 01/31/2015   Health care maintenance 01/31/2015   Left knee pain 10/12/2013   GERD (gastroesophageal reflux disease) 12/29/2012   Abnormal Pap smear of cervix 12/29/2012   Essential hypertension 12/25/2012   Anemia 12/25/2012   Degeneration of lumbar or lumbosacral intervertebral disc 12/25/2012   Hypercholesterolemia 12/25/2012   Vitamin D deficiency 12/25/2012   Multiple sclerosis (HCC) 12/25/2012   No Known Allergies  Current Meds  Medication Sig   albuterol (VENTOLIN HFA) 108 (90  Base) MCG/ACT inhaler Inhale 2 puffs into the lungs every 6 (six) hours as needed for wheezing or shortness of breath (cough).   BREO ELLIPTA 100-25 MCG/INH AEPB INHALE 1 PUFF INTO THE LUNGS DAILY   esomeprazole (NEXIUM) 40 MG capsule TAKE 1 CAPSULE(40 MG) BY MOUTH DAILY   metoprolol succinate (TOPROL-XL) 25 MG 24 hr tablet TAKE 1 TABLET BY MOUTH DAILY   montelukast (SINGULAIR) 10 MG tablet Take  1 tablet (10 mg total) by mouth at bedtime.   Ocrelizumab (OCREVUS IV) Inject into the vein.   Olopatadine HCl 0.6 % SOLN Place 2 sprays into the nose in the morning and at bedtime.   rosuvastatin (CRESTOR) 5 MG tablet Take one tablet q Monday, Wednesday and Friday.   Tiotropium Bromide Monohydrate (SPIRIVA RESPIMAT) 1.25 MCG/ACT AERS Inhale 2 puffs into the lungs daily.   valsartan-hydrochlorothiazide (DIOVAN-HCT) 320-25 MG tablet TAKE 1 TABLET BY MOUTH DAILY   Immunization History  Administered Date(s) Administered   Influenza Split 09/26/2012, 09/24/2013   Influenza, High Dose Seasonal PF 09/08/2019   Influenza,inj,Quad PF,6+ Mos 09/14/2020   Influenza-Unspecified 08/27/2014, 09/21/2015, 09/20/2016, 09/10/2018   PFIZER(Purple Top)SARS-COV-2 Vaccination 02/10/2020, 03/02/2020, 11/16/2020   Zoster Recombinat (Shingrix) 09/11/2019       Objective:   Physical Exam BP 120/80 (BP Location: Left Arm, Cuff Size: Normal)   Pulse 87   Temp (!) 97 F (36.1 C) (Temporal)   Ht 5' 7.5" (1.715 m)   Wt 218 lb 12.8 oz (99.2 kg)   LMP 12/27/2000   SpO2 100%   BMI 33.76 kg/m  GENERAL: Overweight woman, no acute distress.  Fully ambulatory.  No conversational dyspnea.  Occasional congested cough. HEAD: Normocephalic, atraumatic. EYES: Pupils equal, round, reactive to light.  No scleral icterus. MOUTH: Nose/mouth/throat not examined due to masking requirements for COVID 19. NECK: Supple. No thyromegaly. Trachea midline. No JVD.  No adenopathy. PULMONARY: Good air entry bilaterally.  She has faint end expiratory wheezes throughout today.   CARDIOVASCULAR: S1 and S2. Regular rate and rhythm.  No rubs, murmurs or gallops heard. ABDOMEN: Benign. MUSCULOSKELETAL: No joint deformity, no clubbing, no edema. NEUROLOGIC: No focal deficit, no gait disturbance, speech is fluent. SKIN: Intact,warm,dry.  No rashes. PSYCH: Mood and behavior normal      Assessment & Plan:      ICD-10-CM   1. Cough  variant asthma  J45.991 fluticasone furoate-vilanterol (BREO ELLIPTA) 200-25 MCG/INH AEPB    DISCONTINUED: fluticasone furoate-vilanterol (BREO ELLIPTA) 200-25 MCG/INH 1 puff   Breo Ellipta 200/25, 1 inhalation daily Strict management of reflux To see GI    2. 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 Multiple sclerosis (HCC)  G35    On Ocrevus This also worsens cough as well     Meds ordered this encounter  Medications   DISCONTD: fluticasone furoate-vilanterol (BREO ELLIPTA) 200-25 MCG/INH 1 puff   albuterol (VENTOLIN HFA) 108 (90 Base) MCG/ACT inhaler    Sig: Inhale 2 puffs into the lungs every 6 (six) hours as needed for wheezing or shortness of breath (cough).    Dispense:  18 g    Refill:  6   DISCONTD: fluticasone furoate-vilanterol (BREO ELLIPTA) 200-25 MCG/INH AEPB    Sig: Inhale 1 puff into the lungs daily.    Dispense:  30 each    Refill:  11   fluticasone furoate-vilanterol (BREO ELLIPTA) 200-25  MCG/INH AEPB    Sig: Inhale 1 puff into the lungs daily.    Dispense:  14 each    Refill:  0    Order Specific Question:   Lot Number?    Answer:   Aline August    Order Specific Question:   Expiration Date?    Answer:   08/19/2021   To continue her medications that she is doing.  Consider a break off of the Ocrevus to see if this helps her symptoms.  We will see her in follow-up in 3 months time she is to contact us sooner should any new difficulties arise.  Gailen Shelter, MD [facility]   *This note was dictated using voice recognition software/Dragon.  Despite best efforts to proofread, errors can occur which can change the meaning.  Any change was purely unintentional.

## 2021-02-14 NOTE — Patient Instructions (Signed)
We have sent refills to your pharmacy for the albuterol.  We have also sent a prescription for the [facility].   Make sure you rinse your mouth well after you use the Breo.   We will see you in follow-up in 3 months time call sooner should any new difficulties arise.

## 2021-02-15 IMAGING — MG DIGITAL SCREENING BILAT W/ TOMO W/ CAD
8 series · 8 of 24 positions shown · non-contrast
Comparison: Previous exam(s).

ACR Breast Density Category a: The breast tissue is almost entirely
fatty.

CLINICAL DATA: Screening.

EXAM:
DIGITAL SCREENING BILATERAL MAMMOGRAM WITH TOMO AND CAD

[L CC synth-2D]
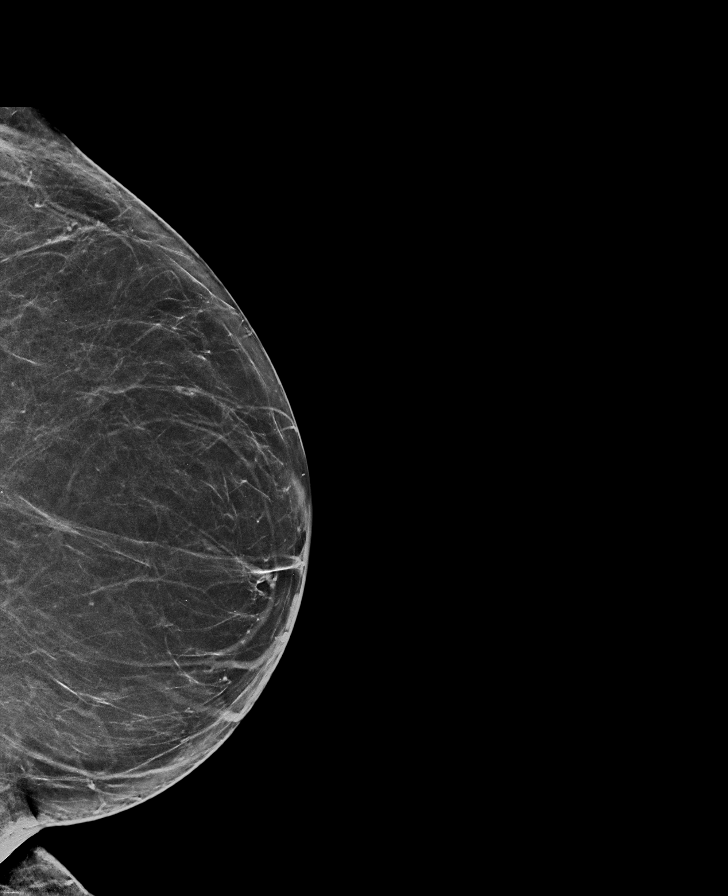

[L MLO synth-2D]
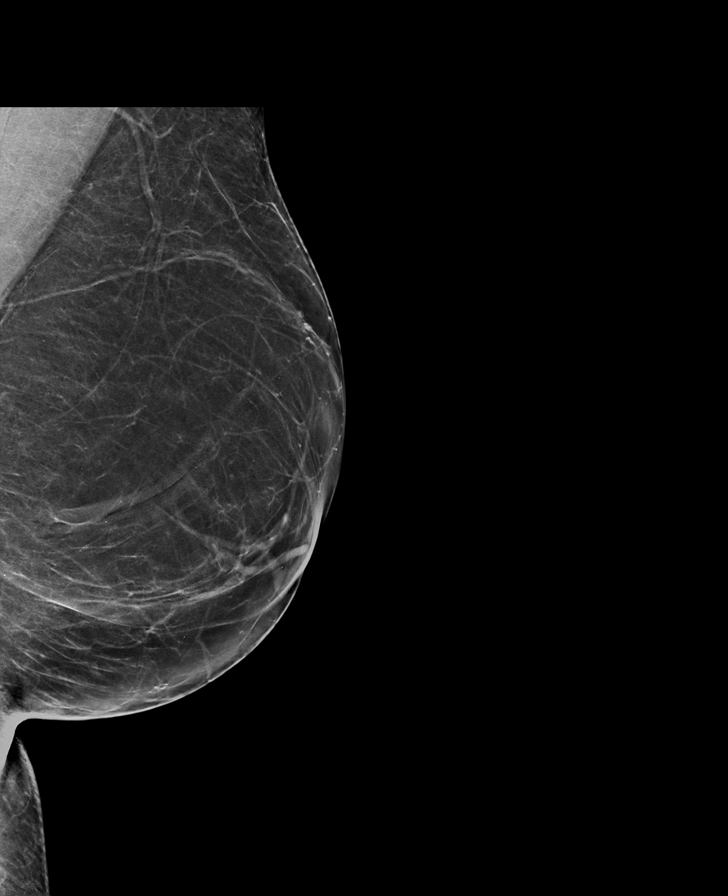

[R MLO synth-2D]
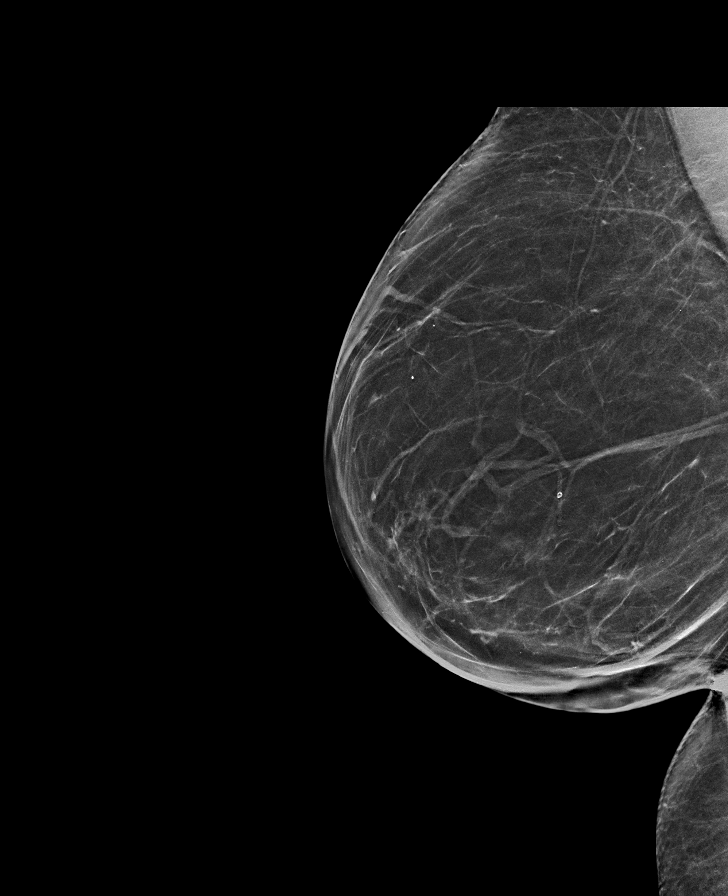

[R CC synth-2D]
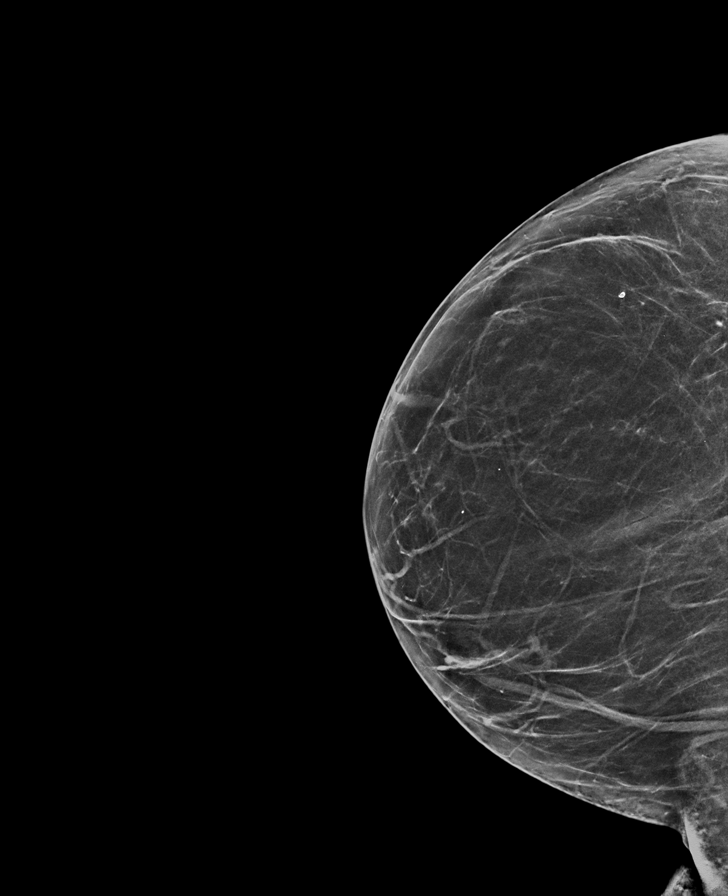

[L CC tomo · tomo slice 33/65.0]
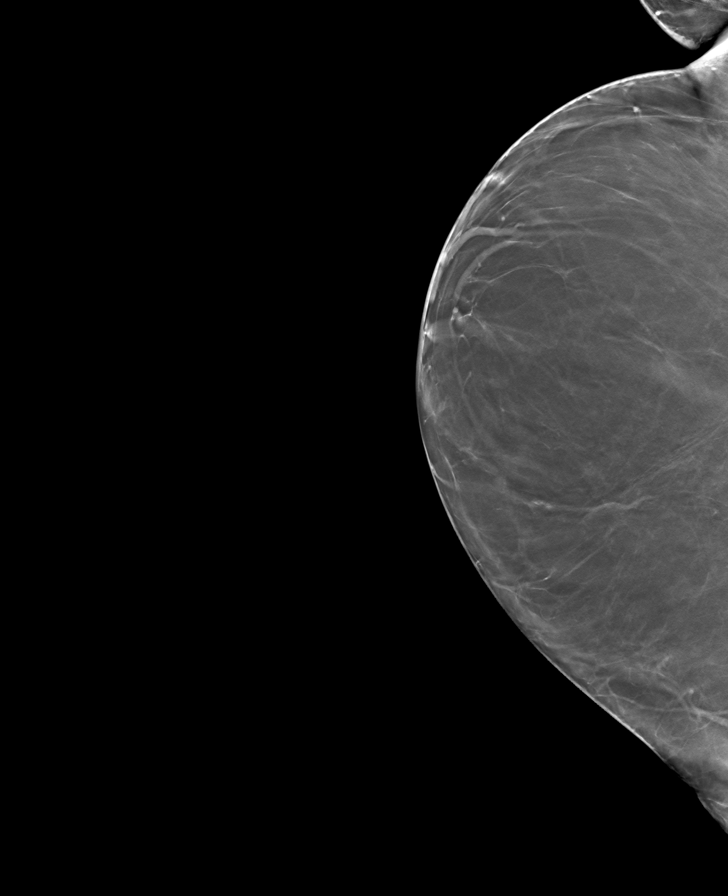

[R CC tomo · tomo slice 32/63.0]
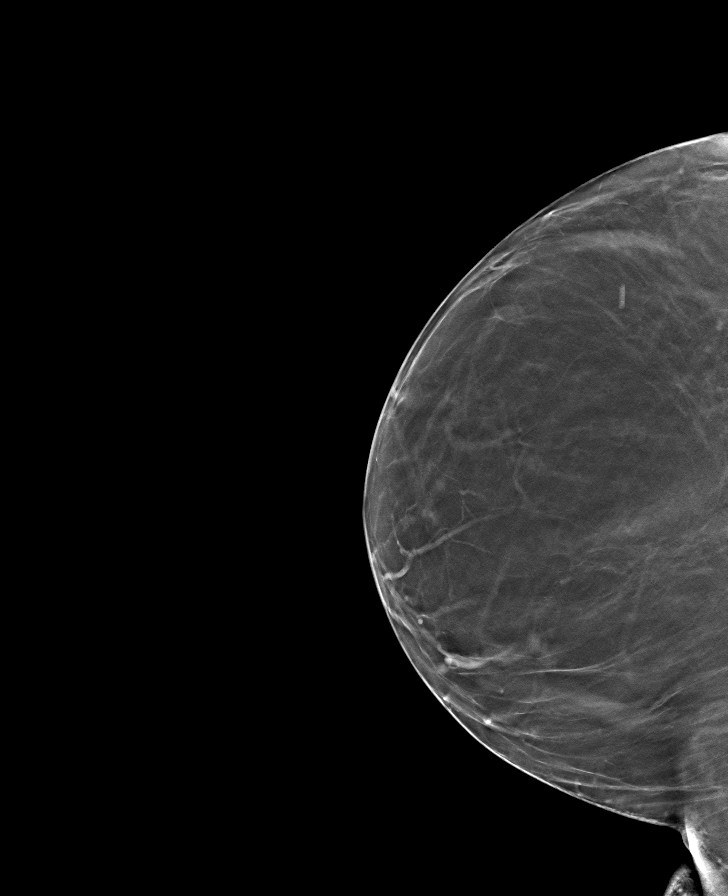

[L MLO tomo · tomo slice 37/73.0]
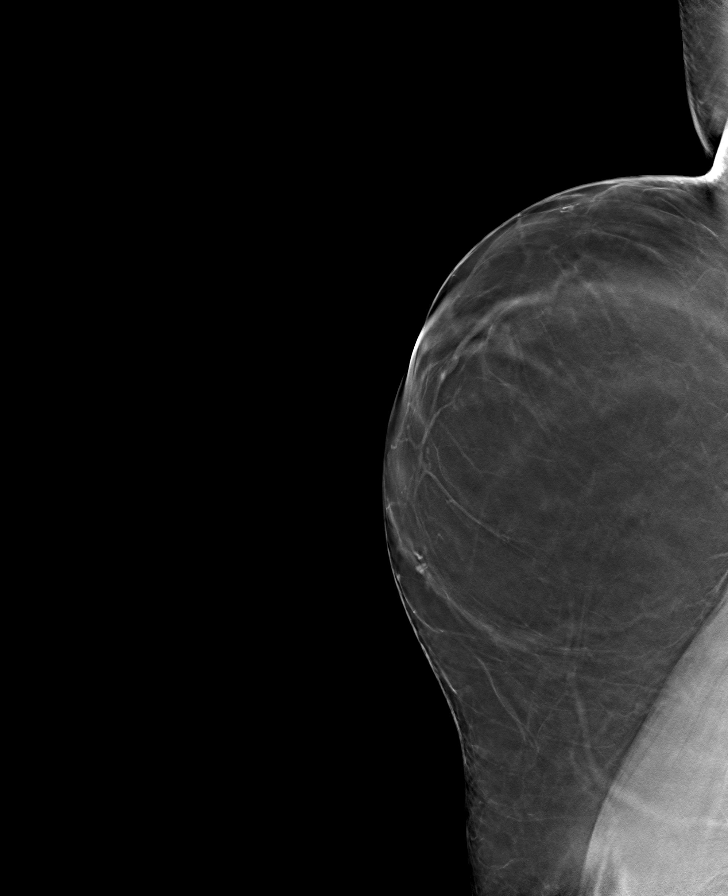

[R MLO tomo · tomo slice 35/70.0]
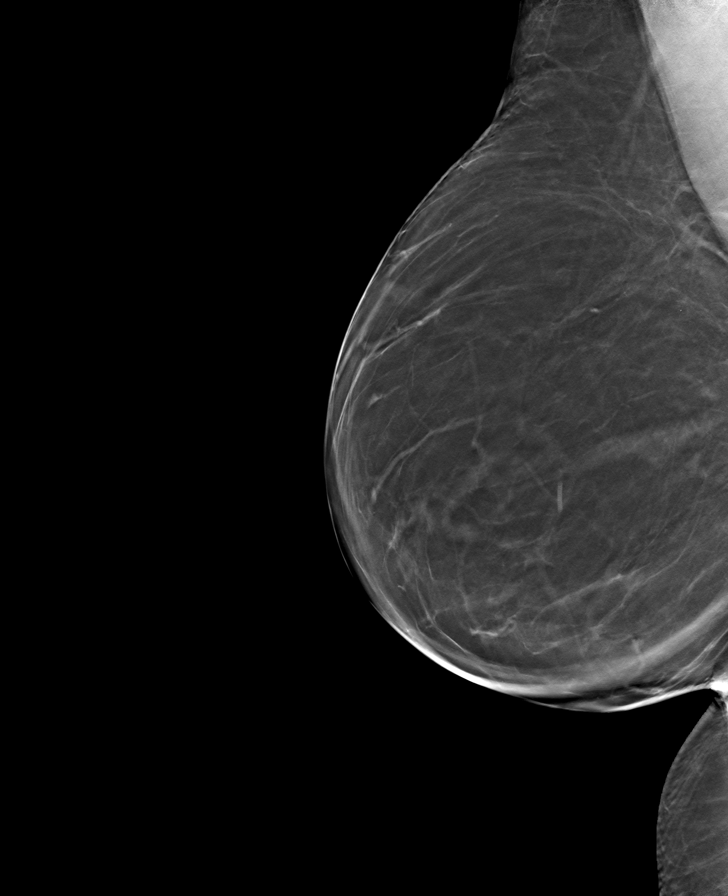

[8 of 24 positions shown; findings below may reference images not displayed]

FINDINGS: There are no findings suspicious for malignancy. Images were
processed with CAD.
IMPRESSION: No mammographic evidence of malignancy. A result letter of this
screening mammogram will be mailed directly to the patient.

RECOMMENDATION:
Screening mammogram in one year. (Code:[id number])

BI-RADS CATEGORY  1: Negative.

## 2021-02-20 ENCOUNTER — Other Ambulatory Visit: Payer: Self-pay | Admitting: Internal Medicine

## 2021-03-10 ENCOUNTER — Other Ambulatory Visit: Payer: Self-pay

## 2021-03-10 ENCOUNTER — Encounter: Payer: Self-pay | Admitting: Gastroenterology

## 2021-03-10 ENCOUNTER — Ambulatory Visit: Payer: BC Managed Care – PPO | Admitting: Gastroenterology

## 2021-03-10 VITALS — BP 146/81 | HR 74 | Temp 98.1°F | Ht 67.5 in | Wt 215.8 lb

## 2021-03-10 DIAGNOSIS — Z8639 Personal history of other endocrine, nutritional and metabolic disease: Secondary | ICD-10-CM | POA: Insufficient documentation

## 2021-03-10 DIAGNOSIS — R933 Abnormal findings on diagnostic imaging of other parts of digestive tract: Secondary | ICD-10-CM

## 2021-03-10 DIAGNOSIS — K219 Gastro-esophageal reflux disease without esophagitis: Secondary | ICD-10-CM

## 2021-03-10 DIAGNOSIS — K279 Peptic ulcer, site unspecified, unspecified as acute or chronic, without hemorrhage or perforation: Secondary | ICD-10-CM | POA: Insufficient documentation

## 2021-03-10 DIAGNOSIS — R1319 Other dysphagia: Secondary | ICD-10-CM

## 2021-03-10 DIAGNOSIS — Z1211 Encounter for screening for malignant neoplasm of colon: Secondary | ICD-10-CM

## 2021-03-10 DIAGNOSIS — Z86010 Personal history of colonic polyps: Secondary | ICD-10-CM | POA: Insufficient documentation

## 2021-03-10 MED ORDER — PEG 3350-KCL-NA BICARB-NACL 420 G PO SOLR: ORAL | 0 refills | Status: DC

## 2021-03-10 NOTE — Progress Notes (Signed)
Jackie White [address]  [address]  [place], [place] [address]  Main: [phone]  Fax: [phone]   Gastroenterology Consultation  Referring Provider:     Salena Saner, MD Primary Care Physician:  Dale [place], MD Reason for Consultation:     GERD        HPI:    Chief Complaint  Patient presents with  . Gastroesophageal Reflux    Jackie White is a 65 y.o. y/o female referred for consultation & management  by Dr. Lorin Picket, Westley Hummer, MD.  Patient recently and reported having cough and reported this to her PCP which led to a CT scan which showed esophageal air-fluid levels suggesting dysmotility or GERD.  Patient states cough has significantly improved.  She is not coughing during my entire evaluation today.  No shortness of breath.  She does report chronic history of reflux and has been on Nexium for 8 to 10 years.  States it was discontinued for 1 month but had to be resumed due to recurrence in symptoms.  Describes having an upper endoscopy 18 years ago and was found to have gastric ulcers.  Procedure report not available.  Does report solid food dysphagia, specifically to potatoes.  States when it occurs she drinks water and symptoms improved.  No recent upper endoscopy.  No nausea or vomiting or weight loss.  Last colonoscopy, 2014 with Dr. Bluford Kaufmann for family history of colon cancer that was normal and repeat was recommended in 5 years.  Good prep reported, extent of exam cecum, 4-minute withdrawal time.  On  Past Medical History:  Diagnosis Date  . Abnormal Pap smear   . Cervical disc disease    Cervical and lumbar disc disease  . GERD (gastroesophageal reflux disease)   . History of iron deficiency   . Hypertension   . Multiple sclerosis (HCC)   . Personal history of colonic polyps   . Reactive airway disease   . Vitamin D deficiency     Past Surgical History:  Procedure Laterality Date  . BREAST REDUCTION SURGERY    . REDUCTION MAMMAPLASTY  Bilateral 2002  . TOTAL ABDOMINAL HYSTERECTOMY  2003   Right Oophrectomy    Prior to Admission medications   Medication Sig Start Date End Date Taking? Authorizing Provider  albuterol (VENTOLIN HFA) 108 (90 Base) MCG/ACT inhaler Inhale 2 puffs into the lungs every 6 (six) hours as needed for wheezing or shortness of breath (cough). 02/14/21  Yes Salena Saner, MD  ergocalciferol (VITAMIN D2) 1.25 MG (50000 UT) capsule Take 1 tablet by mouth daily.   Yes [provider]  esomeprazole (NEXIUM) 40 MG capsule TAKE 1 CAPSULE(40 MG) BY MOUTH DAILY 02/21/21  Yes Dale Purdy, MD  fluticasone furoate-vilanterol (BREO ELLIPTA) 200-25 MCG/INH AEPB Inhale 1 puff into the lungs daily. 02/14/21  Yes Salena Saner, MD  metoprolol succinate (TOPROL-XL) 25 MG 24 hr tablet TAKE 1 TABLET BY MOUTH DAILY 02/21/21  Yes Dale Newell, MD  montelukast (SINGULAIR) 10 MG tablet Take 1 tablet (10 mg total) by mouth at bedtime. 07/06/20  Yes Parrett, [name], NP  Ocrelizumab (OCREVUS IV) Inject into the vein.   Yes [provider]  Olopatadine HCl 0.6 % SOLN Place 2 sprays into the nose in the morning and at bedtime.   Yes [provider]  polyethylene glycol-electrolytes (NULYTELY) 420 g solution Prepare according to package instructions. Starting at 5:00 PM: Drink one 8 oz glass of mixture every 15 minutes until  you finish half of the jug. Five hours prior to procedure, drink 8 oz glass of mixture every 15 minutes until it is all gone. Make sure you do not drink anything 4 hours prior to your procedure. 03/10/21  Yes Jackie White B, MD  rosuvastatin (CRESTOR) 5 MG tablet Take one tablet q Monday, Wednesday and Friday. 01/24/21  Yes Dale [place], MD  Tiotropium Bromide Monohydrate (SPIRIVA RESPIMAT) 1.25 MCG/ACT AERS Inhale 2 puffs into the lungs daily. 01/04/21  Yes Salena Saner, MD  valsartan-hydrochlorothiazide (DIOVAN-HCT) 320-25 MG tablet TAKE 1 TABLET BY MOUTH DAILY 02/21/21   Yes Dale [place], MD    Family History  Problem Relation Age of Onset  . Stroke Mother   . Hypertension Mother   . Heart disease Maternal Grandmother   . Breast cancer Neg Hx   Leiomyosarcoma of the colon and mother who died from it  Social History   Tobacco Use  . Smoking status: Never Smoker  . Smokeless tobacco: Never Used  Vaping Use  . Vaping Use: Never used  Substance Use Topics  . Alcohol use: Yes    Alcohol/week: 0.0 standard drinks  . Drug use: No    Allergies as of 03/10/2021  . (No Known Allergies)    Review of Systems:    All systems reviewed and negative except where noted in HPI.   Physical Exam:  BP (!) 146/81   Pulse 74   Temp 98.1 F (36.7 C) (Oral)   Ht 5' 7.5" (1.715 m)   Wt 215 lb 12.8 oz (97.9 kg)   LMP 12/27/2000   BMI 33.30 kg/m  Patient's last menstrual period was 12/27/2000. Psych:  Alert and cooperative. Normal mood and affect. General:   Alert,  Well-developed, well-nourished, pleasant and cooperative in NAD Head:  Normocephalic and atraumatic. Eyes:  Sclera clear, no icterus.   Conjunctiva pink. Ears:  Normal auditory acuity. Nose:  No deformity, discharge, or lesions. Mouth:  No deformity or lesions,oropharynx pink & moist. Neck:  Supple; no masses or thyromegaly. Abdomen:  Normal bowel sounds.  No bruits.  Soft, non-tender and non-distended without masses, hepatosplenomegaly or hernias noted.  No guarding or rebound tenderness.    Msk:  Symmetrical without gross deformities. Good, equal movement & strength bilaterally. Pulses:  Normal pulses noted. Extremities:  No clubbing or edema.  No cyanosis. Neurologic:  Alert and oriented x3;  grossly normal neurologically. Skin:  Intact without significant lesions or rashes. No jaundice. Lymph Nodes:  No significant cervical adenopathy. Psych:  Alert and cooperative. Normal mood and affect.   Labs: CBC    Component Value Date/Time   WBC 6.9 09/14/2020 1005   RBC 4.93 09/14/2020  1005   HGB 13.9 09/14/2020 1005   HCT 42.0 09/14/2020 1005   PLT 304.0 09/14/2020 1005   MCV 85.2 09/14/2020 1005   MCH 27.1 03/01/2016 1324   MCHC 33.0 09/14/2020 1005   RDW 14.7 09/14/2020 1005   LYMPHSABS 1.9 09/14/2020 1005   MONOABS 0.8 09/14/2020 1005   EOSABS 0.8 (H) 09/14/2020 1005   BASOSABS 0.1 09/14/2020 1005   CMP     Component Value Date/Time   NA 141 09/14/2020 1005   NA 142 07/09/2012 0000   K 4.3 02/10/2021 0956   CL 102 09/14/2020 1005   CO2 33 (H) 09/14/2020 1005   GLUCOSE 83 09/14/2020 1005   BUN 14 09/14/2020 1005   BUN 16 07/09/2012 0000   CREATININE 0.81 09/14/2020 1005   CREATININE 0.82 10/12/2012 0842  CALCIUM 9.9 09/14/2020 1005   PROT 6.1 09/14/2020 1005   ALBUMIN 4.1 09/14/2020 1005   AST 13 09/14/2020 1005   ALT 11 09/14/2020 1005   ALKPHOS 84 09/14/2020 1005   BILITOT 0.7 09/14/2020 1005   GFRNONAA >60 03/01/2016 1324   GFRNONAA >60 10/12/2012 0842   GFRAA >60 03/01/2016 1324   GFRAA >60 10/12/2012 0842    Imaging Studies: No results found.  Assessment and Plan:   Jackie White is a 65 y.o. y/o female has been referred for GERD  Patient symptoms of dysphagia, combined with CT showing esophageal air-fluid level suggesting dysmotility, is an indication for further evaluation with upper endoscopy to rule out strictures or any other underlying abnormalities  Alternative options of conservative management were discussed in detail, including but not limited to medication management, foregoing endoscopic procedures at this time and others.    In addition, patient has had chronic GERD and is about 65 years of age, and has been on chronic PPI for a decade.  She would benefit from Barrett's screening as well  Patient educated extensively on acid reflux lifestyle modification, including buying a bed wedge, not eating 3 hrs before bedtime, diet modifications, and handout given for the same.   Patient also due for colonoscopy due to family  history of colon cancer in mother  I have discussed alternative options, risks & benefits,  which include, but are not limited to, bleeding, infection, perforation,respiratory complication & drug reaction.  The patient agrees with this plan & written consent will be obtained.    (Risks of PPI use were discussed with patient including bone loss, C. Diff diarrhea, pneumonia, infections, CKD, electrolyte abnormalities.  Pt. Verbalizes understanding and chooses to continue the medication.)   Dr Jackie White  Speech recognition software was used to dictate the above note.

## 2021-03-11 ENCOUNTER — Encounter: Payer: Self-pay | Admitting: Pulmonary Disease

## 2021-03-11 NOTE — Progress Notes (Signed)
Thank you for your input!

## 2021-03-16 ENCOUNTER — Ambulatory Visit
Admission: RE | Admit: 2021-03-16 | Discharge: 2021-03-16 | Disposition: A | Discharge status: Home / Self Care | Payer: BC Managed Care – PPO | Attending: Gastroenterology | Admitting: Gastroenterology

## 2021-03-16 ENCOUNTER — Ambulatory Visit: Payer: BC Managed Care – PPO | Admitting: Anesthesiology

## 2021-03-16 ENCOUNTER — Encounter
Admission: RE | Disposition: A | Discharge status: Home / Self Care | Payer: Self-pay | Source: Home / Self Care | Attending: Gastroenterology

## 2021-03-16 ENCOUNTER — Encounter: Payer: Self-pay | Admitting: Gastroenterology

## 2021-03-16 ENCOUNTER — Other Ambulatory Visit: Payer: Self-pay

## 2021-03-16 DIAGNOSIS — K5730 Diverticulosis of large intestine without perforation or abscess without bleeding: Secondary | ICD-10-CM | POA: Diagnosis not present

## 2021-03-16 DIAGNOSIS — Z79899 Other long term (current) drug therapy: Secondary | ICD-10-CM | POA: Diagnosis not present

## 2021-03-16 DIAGNOSIS — Z13810 Encounter for screening for upper gastrointestinal disorder: Secondary | ICD-10-CM

## 2021-03-16 DIAGNOSIS — K449 Diaphragmatic hernia without obstruction or gangrene: Secondary | ICD-10-CM | POA: Diagnosis not present

## 2021-03-16 DIAGNOSIS — Z1211 Encounter for screening for malignant neoplasm of colon: Secondary | ICD-10-CM | POA: Insufficient documentation

## 2021-03-16 DIAGNOSIS — Z7951 Long term (current) use of inhaled steroids: Secondary | ICD-10-CM | POA: Diagnosis not present

## 2021-03-16 DIAGNOSIS — K2970 Gastritis, unspecified, without bleeding: Secondary | ICD-10-CM | POA: Insufficient documentation

## 2021-03-16 DIAGNOSIS — K2289 Other specified disease of esophagus: Secondary | ICD-10-CM | POA: Insufficient documentation

## 2021-03-16 DIAGNOSIS — Z800 Family history of malignant neoplasm of digestive organs: Secondary | ICD-10-CM | POA: Insufficient documentation

## 2021-03-16 DIAGNOSIS — K222 Esophageal obstruction: Secondary | ICD-10-CM | POA: Insufficient documentation

## 2021-03-16 DIAGNOSIS — K648 Other hemorrhoids: Secondary | ICD-10-CM | POA: Diagnosis not present

## 2021-03-16 DIAGNOSIS — K319 Disease of stomach and duodenum, unspecified: Secondary | ICD-10-CM | POA: Diagnosis not present

## 2021-03-16 DIAGNOSIS — R1319 Other dysphagia: Secondary | ICD-10-CM

## 2021-03-16 DIAGNOSIS — Z8719 Personal history of other diseases of the digestive system: Secondary | ICD-10-CM | POA: Insufficient documentation

## 2021-03-16 DIAGNOSIS — R1310 Dysphagia, unspecified: Secondary | ICD-10-CM | POA: Diagnosis present

## 2021-03-16 DIAGNOSIS — K317 Polyp of stomach and duodenum: Secondary | ICD-10-CM | POA: Diagnosis not present

## 2021-03-16 HISTORY — PX: ESOPHAGOGASTRODUODENOSCOPY (EGD) WITH PROPOFOL: SHX5813

## 2021-03-16 HISTORY — PX: COLONOSCOPY WITH PROPOFOL: SHX5780

## 2021-03-16 SURGERY — COLONOSCOPY WITH PROPOFOL
Anesthesia: General

## 2021-03-16 MED ORDER — SODIUM CHLORIDE 0.9 % IV SOLN
INTRAVENOUS | Status: DC
Start: 2021-03-16 — End: 2021-03-16

## 2021-03-16 MED ORDER — PROPOFOL 500 MG/50ML IV EMUL
INTRAVENOUS | Status: AC
  Filled 2021-03-16: qty 50

## 2021-03-16 MED ORDER — PROPOFOL 10 MG/ML IV BOLUS
INTRAVENOUS | Status: AC
  Filled 2021-03-16: qty 20

## 2021-03-16 MED ORDER — PROPOFOL 10 MG/ML IV BOLUS
INTRAVENOUS | Status: DC | PRN
  Administered 2021-03-16: 20 mg via INTRAVENOUS
  Administered 2021-03-16: 10 mg via INTRAVENOUS
  Administered 2021-03-16: 20 mg via INTRAVENOUS
  Administered 2021-03-16: 50 mg via INTRAVENOUS
  Administered 2021-03-16: 20 mg via INTRAVENOUS

## 2021-03-16 MED ORDER — PROPOFOL 500 MG/50ML IV EMUL
INTRAVENOUS | Status: DC | PRN
  Administered 2021-03-16: 180 ug/kg/min via INTRAVENOUS

## 2021-03-16 MED ORDER — EPHEDRINE SULFATE 50 MG/ML IJ SOLN
INTRAMUSCULAR | Status: DC | PRN
  Administered 2021-03-16: 10 mg via INTRAVENOUS

## 2021-03-16 MED ORDER — LIDOCAINE HCL (CARDIAC) PF 100 MG/5ML IV SOSY
PREFILLED_SYRINGE | INTRAVENOUS | Status: DC | PRN
  Administered 2021-03-16: 100 mg via INTRAVENOUS

## 2021-03-16 NOTE — Anesthesia Preprocedure Evaluation (Signed)
Anesthesia Evaluation  Patient identified by MRN, date of birth, ID band Patient awake    Reviewed: Allergy & Precautions, H&P , NPO status , Patient's Chart, lab work & pertinent test results  History of Anesthesia Complications Negative for: history of anesthetic complications  Airway Mallampati: III  TM Distance: >3 FB Neck ROM: full    Dental  (+) Chipped   Pulmonary shortness of breath and with exertion, asthma ,    Pulmonary exam normal        Cardiovascular Exercise Tolerance: Good hypertension, (-) anginaNormal cardiovascular exam     Neuro/Psych negative neurological ROS  negative psych ROS   GI/Hepatic Neg liver ROS, PUD, GERD  Medicated and Controlled,  Endo/Other  negative endocrine ROS  Renal/GU negative Renal ROS  negative genitourinary   Musculoskeletal  (+) Arthritis ,   Abdominal   Peds  Hematology negative hematology ROS (+)   Anesthesia Other Findings Past Medical History: No date: Abnormal Pap smear No date: Cervical disc disease     Comment:  Cervical and lumbar disc disease No date: GERD (gastroesophageal reflux disease) No date: History of iron deficiency No date: Hypertension No date: Multiple sclerosis (HCC) No date: Personal history of colonic polyps No date: Reactive airway disease No date: Vitamin D deficiency  Past Surgical History: No date: BREAST REDUCTION SURGERY 2002: REDUCTION MAMMAPLASTY; Bilateral 2003: TOTAL ABDOMINAL HYSTERECTOMY     Comment:  Right Oophrectomy     Reproductive/Obstetrics negative OB ROS                             Anesthesia Physical Anesthesia Plan  ASA: III  Anesthesia Plan: General   Post-op Pain Management:    Induction: Intravenous  PONV Risk Score and Plan: Propofol infusion and TIVA  Airway Management Planned: Natural Airway and Nasal Cannula  Additional Equipment:   Intra-op Plan:    Post-operative Plan:   Informed Consent: I have reviewed the patients History and Physical, chart, labs and discussed the procedure including the risks, benefits and alternatives for the proposed anesthesia with the patient or authorized representative who has indicated his/her understanding and acceptance.     Dental Advisory Given  Plan Discussed with: Anesthesiologist, CRNA and Surgeon  Anesthesia Plan Comments: (Patient consented for risks of anesthesia including but not limited to:  - adverse reactions to medications - risk of airway placement if required - damage to eyes, teeth, lips or other oral mucosa - nerve damage due to positioning  - sore throat or hoarseness - Damage to heart, brain, nerves, lungs, other parts of body or loss of life  Patient voiced understanding.)        Anesthesia Quick Evaluation

## 2021-03-16 NOTE — Anesthesia Postprocedure Evaluation (Signed)
Anesthesia Post Note  Patient: [name]  Procedure(s) Performed: COLONOSCOPY WITH PROPOFOL (N/A ) ESOPHAGOGASTRODUODENOSCOPY (EGD) WITH PROPOFOL (N/A )  Patient location during evaluation: Endoscopy Anesthesia Type: General Level of consciousness: awake and alert Pain management: pain level controlled Vital Signs Assessment: post-procedure vital signs reviewed and stable Respiratory status: spontaneous breathing, nonlabored ventilation, respiratory function stable and patient connected to nasal cannula oxygen Cardiovascular status: blood pressure returned to baseline and stable Postop Assessment: no apparent nausea or vomiting Anesthetic complications: no   No complications documented.   Last Vitals:  Vitals:   03/16/21 1148 03/16/21 1158  BP: (!) 119/47 119/83  Pulse: 69 60  Resp: (!) 21 17  Temp:    SpO2: 100% 100%    Last Pain:  Vitals:   03/16/21 1200  TempSrc:   PainSc: 0-No pain                 Cleda Mccreedy Kollins Fenter

## 2021-03-16 NOTE — Anesthesia Procedure Notes (Signed)
Date/Time: 03/16/2021 10:50 AM Performed by: Henrietta Hoover, CRNA Pre-anesthesia Checklist: Emergency Drugs available, Patient identified, Suction available, Patient being monitored and Timeout performed Patient Re-evaluated:Patient Re-evaluated prior to induction Oxygen Delivery Method: Nasal cannula Placement Confirmation: positive ETCO2

## 2021-03-16 NOTE — Transfer of Care (Signed)
Immediate Anesthesia Transfer of Care Note  Patient: Jackie White  Procedure(s) Performed: COLONOSCOPY WITH PROPOFOL (N/A ) ESOPHAGOGASTRODUODENOSCOPY (EGD) WITH PROPOFOL (N/A )  Patient Location: PACU  Anesthesia Type:General  Level of Consciousness: awake, alert  and oriented  Airway & Oxygen Therapy: Patient Spontanous Breathing and Patient connected to nasal cannula oxygen  Post-op Assessment: Report given to RN and Post -op Vital signs reviewed and stable  Post vital signs: Reviewed and stable  Last Vitals:  Vitals Value Taken Time  BP 118/79 03/16/21 1141  Temp 35.6 C 03/16/21 1138  Pulse 74 03/16/21 1144  Resp 25 03/16/21 1144  SpO2 100 % 03/16/21 1144  Vitals shown include unvalidated device data.  Last Pain:  Vitals:   03/16/21 1138  TempSrc: Tympanic  PainSc: 0-No pain         Complications: No complications documented.

## 2021-03-16 NOTE — H&P (Signed)
Melodie Bouillon, MD [address], [address], [place], [place], [address] [address], [place], [place], [address] Phone: [phone]  Fax: [phone]  Primary Care Physician:  Dale South Hill, MD   Pre-Procedure History & Physical: HPI:  Jackie White is a 65 y.o. female is here for a colonoscopy and EGD.   Past Medical History:  Diagnosis Date  . Abnormal Pap smear   . Cervical disc disease    Cervical and lumbar disc disease  . GERD (gastroesophageal reflux disease)   . History of iron deficiency   . Hypertension   . Multiple sclerosis (HCC)   . Personal history of colonic polyps   . Reactive airway disease   . Vitamin D deficiency     Past Surgical History:  Procedure Laterality Date  . BREAST REDUCTION SURGERY    . REDUCTION MAMMAPLASTY Bilateral 2002  . TOTAL ABDOMINAL HYSTERECTOMY  2003   Right Oophrectomy    Prior to Admission medications   Medication Sig Start Date End Date Taking? Authorizing Provider  ergocalciferol (VITAMIN D2) 1.25 MG (50000 UT) capsule Take 1 tablet by mouth daily.   Yes [provider]  esomeprazole (NEXIUM) 40 MG capsule TAKE 1 CAPSULE(40 MG) BY MOUTH DAILY 02/21/21  Yes Dale Rosamond, MD  fluticasone furoate-vilanterol (BREO ELLIPTA) 200-25 MCG/INH AEPB Inhale 1 puff into the lungs daily. 02/14/21  Yes Salena Saner, MD  metoprolol succinate (TOPROL-XL) 25 MG 24 hr tablet TAKE 1 TABLET BY MOUTH DAILY 02/21/21  Yes Dale San Jose, MD  montelukast (SINGULAIR) 10 MG tablet Take 1 tablet (10 mg total) by mouth at bedtime. 07/06/20  Yes Parrett, [name], NP  Ocrelizumab (OCREVUS IV) Inject into the vein.   Yes [provider]  polyethylene glycol-electrolytes (NULYTELY) 420 g solution Prepare according to package instructions. Starting at 5:00 PM: Drink one 8 oz glass of mixture every 15 minutes until you finish half of the jug. Five hours prior to procedure, drink 8 oz glass of mixture every 15 minutes  until it is all gone. Make sure you do not drink anything 4 hours prior to your procedure. 03/10/21  Yes Melodie Bouillon B, MD  rosuvastatin (CRESTOR) 5 MG tablet Take one tablet q Monday, Wednesday and Friday. 01/24/21  Yes Dale Somers, MD  Tiotropium Bromide Monohydrate (SPIRIVA RESPIMAT) 1.25 MCG/ACT AERS Inhale 2 puffs into the lungs daily. 01/04/21  Yes Salena Saner, MD  valsartan-hydrochlorothiazide (DIOVAN-HCT) 320-25 MG tablet TAKE 1 TABLET BY MOUTH DAILY 02/21/21  Yes Dale McLean, MD  albuterol (VENTOLIN HFA) 108 (90 Base) MCG/ACT inhaler Inhale 2 puffs into the lungs every 6 (six) hours as needed for wheezing or shortness of breath (cough). 02/14/21   Salena Saner, MD  Olopatadine HCl 0.6 % SOLN Place 2 sprays into the nose in the morning and at bedtime.    [provider]    Allergies as of 03/10/2021  . (No Known Allergies)    Family History  Problem Relation Age of Onset  . Stroke Mother   . Hypertension Mother   . Heart disease Maternal Grandmother   . Breast cancer Neg Hx     Social History   Socioeconomic History  . Marital status: Married    Spouse name: Not on file  . Number of children: Not on file  . Years of education: Not on file  . Highest education level: Not on file  Occupational History  . Not on file  Tobacco Use  . Smoking status:  Never Smoker  . Smokeless tobacco: Never Used  Vaping Use  . Vaping Use: Never used  Substance and Sexual Activity  . Alcohol use: Yes    Alcohol/week: 0.0 standard drinks  . Drug use: No  . Sexual activity: Not on file  Other Topics Concern  . Not on file  Social History Narrative  . Not on file   Social Determinants of Health   Financial Resource Strain: Not on file  Food Insecurity: Not on file  Transportation Needs: Not on file  Physical Activity: Not on file  Stress: Not on file  Social Connections: Not on file  Intimate Partner Violence: Not on file    Review of Systems: See  HPI, otherwise negative ROS  Physical Exam: BP (!) 146/93   Pulse 65   Temp 97.9 F (36.6 C) (Temporal)   Resp 16   LMP 12/27/2000   SpO2 100%  General:   Alert,  pleasant and cooperative in NAD Head:  Normocephalic and atraumatic. Neck:  Supple; no masses or thyromegaly. Lungs:  Clear throughout to auscultation, normal respiratory effort.    Heart:  +S1, +S2, Regular rate and rhythm, No edema. Abdomen:  Soft, nontender and nondistended. Normal bowel sounds, without guarding, and without rebound.   Neurologic:  Alert and  oriented x4;  grossly normal neurologically.  Impression/Plan: Jackie White is here for a colonoscopy to be performed for family history of colon cancer and EGD for Acid Reflux, barretts screening, dysphagia, abnormal imaging of the esophagus.  Risks, benefits, limitations, and alternatives regarding the procedures have been reviewed with the patient.  Questions have been answered.  All parties agreeable.   Pasty Spillers, MD  03/16/2021, 10:38 AM

## 2021-03-16 NOTE — Op Note (Signed)
[facility] Gastroenterology Patient Name: Jackie White Procedure Date: 03/16/2021 10:48 AM MRN: [id number] Account #: [id number] Date of Birth: 1956/03/26 Admit Type: Outpatient Age: 65 Room: [facility] ENDO ROOM 3 Gender: Female Note Status: Finalized Procedure:             Colonoscopy Indications:           Screening in patient at increased risk: Family history                         of 1st-degree relative with colorectal cancer Providers:             Gearlene Godsil B. Maximino Greenland MD, MD Referring MD:          Dale Marcus, MD (Referring MD) Medicines:             Monitored Anesthesia Care Complications:         No immediate complications. Procedure:             Pre-Anesthesia Assessment:                        - Prior to the procedure, a History and Physical was                         performed, and patient medications, allergies and                         sensitivities were reviewed. The patient's tolerance                         of previous anesthesia was reviewed.                        - The risks and benefits of the procedure and the                         sedation options and risks were discussed with the                         patient. All questions were answered and informed                         consent was obtained.                        - Patient identification and proposed procedure were                         verified prior to the procedure by the physician, the                         nurse, the anesthetist and the technician. The                         procedure was verified in the pre-procedure area in                         the procedure room in the endoscopy suite.                        -  ASA Grade Assessment: II - A patient with mild                         systemic disease.                        - After reviewing the risks and benefits, the patient                         was deemed in satisfactory condition to undergo the                          procedure.                        After obtaining informed consent, the colonoscope was                         passed under direct vision. Throughout the procedure,                         the patient's blood pressure, pulse, and oxygen                         saturations were monitored continuously. The                         Colonoscope was introduced through the anus and                         advanced to the the cecum, identified by appendiceal                         orifice and ileocecal valve. The colonoscopy was                         performed with ease. The patient tolerated the                         procedure well. The quality of the bowel preparation                         was good. Findings:      Hemorrhoids were found on perianal exam.      Multiple diverticula were found in the sigmoid colon.      The exam was otherwise without abnormality.      The rectum, sigmoid colon, descending colon, transverse colon, ascending       colon and cecum appeared normal.      Non-bleeding internal hemorrhoids were found during retroflexion.      No additional abnormalities were found on retroflexion. Impression:            - Hemorrhoids found on perianal exam.                        - Diverticulosis in the sigmoid colon.                        - The examination was otherwise normal.                        -  The rectum, sigmoid colon, descending colon,                         transverse colon, ascending colon and cecum are normal.                        - Non-bleeding internal hemorrhoids.                        - No specimens collected. Recommendation:        - Discharge patient to home.                        - Resume previous diet.                        - Continue present medications.                        - Repeat colonoscopy in 5 years for screening purposes.                        - Return to primary care physician as previously                          scheduled.                        - The findings and recommendations were discussed with                         the patient.                        - The findings and recommendations were discussed with                         the patient's family.                        - High fiber diet. Procedure Code(s):     --- Professional ---                        [id number], Colonoscopy, flexible; diagnostic, including                         collection of specimen(s) by brushing or washing, when                         performed (separate procedure) Diagnosis Code(s):     --- Professional ---                        Z80.0, Family history of malignant neoplasm of                         digestive organs CPT copyright 2019 American Medical Association. All rights reserved. The codes documented in this report are preliminary and upon coder review may  be revised to meet current compliance requirements.  Melodie Bouillon, MD Michel Bickers B. Maximino Greenland MD, MD 03/16/2021 11:40:36 AM This report has been signed electronically. Number  of Addenda: 0 Note Initiated On: 03/16/2021 10:48 AM Scope Withdrawal Time: 0 hours 14 minutes 44 seconds  Total Procedure Duration: 0 hours 18 minutes 12 seconds       [facility]

## 2021-03-16 NOTE — Op Note (Signed)
[facility] Gastroenterology Patient Name: Jackie White Procedure Date: 03/16/2021 10:49 AM MRN: [id number] Account #: [id number] Date of Birth: May 13, 1956 Admit Type: Outpatient Age: 65 Room: [facility] ENDO ROOM 3 Gender: Female Note Status: Finalized Procedure:             Upper GI endoscopy Indications:           Dysphagia, Screening for Barrett's esophagus Providers:             Humzah Harty B. Maximino Greenland MD, MD Referring MD:          Dale Keota, MD (Referring MD) Medicines:             Monitored Anesthesia Care Complications:         No immediate complications. Procedure:             Pre-Anesthesia Assessment:                        - Prior to the procedure, a History and Physical was                         performed, and patient medications, allergies and                         sensitivities were reviewed. The patient's tolerance                         of previous anesthesia was reviewed.                        - The risks and benefits of the procedure and the                         sedation options and risks were discussed with the                         patient. All questions were answered and informed                         consent was obtained.                        - Patient identification and proposed procedure were                         verified prior to the procedure by the physician, the                         nurse, the anesthesiologist, the anesthetist and the                         technician. The procedure was verified in the                         procedure room.                        - ASA Grade Assessment: II - A patient with mild  systemic disease.                        After obtaining informed consent, the endoscope was                         passed under direct vision. Throughout the procedure,                         the patient's blood pressure, pulse, and oxygen                         saturations were  monitored continuously. The Endoscope                         was introduced through the mouth, and advanced to the                         second part of duodenum. The upper GI endoscopy was                         accomplished with ease. The patient tolerated the                         procedure well. Findings:      Mucosal changes including feline appearance were found in the entire       esophagus. Biopsies were obtained from the proximal and distal esophagus       with cold forceps for histology of suspected eosinophilic esophagitis.      A widely patent Schatzki ring was found at the gastroesophageal junction.      The Z-line was regular.      The exam of the esophagus was otherwise normal.      Patchy mildly erythematous mucosa without bleeding was found in the       gastric antrum. Biopsies were taken with a cold forceps for histology.       Biopsies were obtained in the gastric body, at the incisura and in the       gastric antrum with cold forceps for histology.      A hiatal hernia was present.      A few sessile polyps with no stigmata of recent bleeding were found in       the gastric fundus. Biopsies were taken with a cold forceps for       histology.      The exam of the stomach was otherwise normal.      The duodenal bulb, second portion of the duodenum and examined duodenum       were normal. Impression:            - Esophageal mucosal changes suggestive of                         eosinophilic esophagitis. Biopsied.                        - Widely patent Schatzki ring.                        - Erythematous mucosa in the antrum. Biopsied.                        -  Hiatal hernia.                        - A few gastric polyps. Biopsied.                        - Normal duodenal bulb, second portion of the duodenum                         and examined duodenum.                        - Biopsies were obtained in the gastric body, at the                         incisura and  in the gastric antrum. Recommendation:        - Await pathology results.                        - Discharge patient to home (with escort).                        - Advance diet as tolerated.                        - Continue present medications.                        - Patient has a contact number available for                         emergencies. The signs and symptoms of potential                         delayed complications were discussed with the patient.                         Return to normal activities tomorrow. Written                         discharge instructions were provided to the patient.                        - Discharge patient to home (with escort).                        - The findings and recommendations were discussed with                         the patient.                        - The findings and recommendations were discussed with                         the patient's family.                        - Follow an antireflux regimen. Procedure Code(s):     --- Professional ---  78295, Esophagogastroduodenoscopy, flexible,                         transoral; with biopsy, single or multiple Diagnosis Code(s):     --- Professional ---                        K22.8, Other specified diseases of esophagus                        K22.2, Esophageal obstruction                        K31.89, Other diseases of stomach and duodenum                        K44.9, Diaphragmatic hernia without obstruction or                         gangrene                        K31.7, Polyp of stomach and duodenum                        R13.10, Dysphagia, unspecified                        Z13.810, Encounter for screening for upper                         gastrointestinal disorder CPT copyright 2019 American Medical Association. All rights reserved. The codes documented in this report are preliminary and upon coder review may  be revised to meet current compliance  requirements.  Melodie Bouillon, MD Michel Bickers B. Maximino Greenland MD, MD 03/16/2021 11:12:04 AM This report has been signed electronically. Number of Addenda: 0 Note Initiated On: 03/16/2021 10:49 AM Estimated Blood Loss:  Estimated blood loss: none.      [facility]

## 2021-03-17 ENCOUNTER — Encounter: Payer: Self-pay | Admitting: Gastroenterology

## 2021-03-18 LAB — SURGICAL PATHOLOGY

## 2021-03-22 ENCOUNTER — Encounter: Payer: Self-pay | Admitting: Gastroenterology

## 2021-04-19 ENCOUNTER — Telehealth: Payer: Self-pay | Admitting: Pulmonary Disease

## 2021-04-19 DIAGNOSIS — J45991 Cough variant asthma: Secondary | ICD-10-CM

## 2021-04-19 MED ORDER — ALBUTEROL SULFATE (2.5 MG/3ML) 0.083% IN NEBU: 2.5000 mg | INHALATION_SOLUTION | RESPIRATORY_TRACT | 2 refills | Status: DC | PRN

## 2021-04-19 NOTE — Telephone Encounter (Signed)
Ok to send in order for nebulizer and albuterol solution. She should resume Spiriva and Breo. I would have her take mucinex 600mg  twice daily. If symptoms persist needs visit in person if availability or televisit

## 2021-04-19 NOTE — Telephone Encounter (Signed)
Patient is aware of below recommendations and voiced her understanding.  Rx for albuterol solution and neb machine has been sent to preferred pharmacy.  Nothing further needed at this time.

## 2021-04-19 NOTE — Telephone Encounter (Signed)
Called and spoke to patient.  Patient is requesting Rx for nebulizer machine and albuterol neb solution.  She reports of increased sob with exertion, wheezing, chest tightness and prod cough with yellow sputum. Sx developed over the weekend.  She traveled this weekend and forgot to take spiriva and breo with her. She used albuterol HFA over the weekend with temporary relief in sx.  Denies fever, chills or sweats.  Fully vaccinated against covid and flu.  Beth, please advise. Thanks

## 2021-04-25 ENCOUNTER — Telehealth: Payer: Self-pay | Admitting: Pulmonary Disease

## 2021-04-25 MED ORDER — AZITHROMYCIN 250 MG PO TABS: ORAL_TABLET | ORAL | 0 refills | Status: AC

## 2021-04-25 MED ORDER — PREDNISONE 10 MG (21) PO TBPK: ORAL_TABLET | ORAL | 0 refills | Status: DC

## 2021-04-25 NOTE — Telephone Encounter (Signed)
I would recommend a prednisone taper pack of 21 tablets as directed in the package.  Also Azithromycin Dosepak.  I suspect she has bronchitis.  Lets see her in follow-up in 3 to 4 weeks with either me or the nurse practitioner.

## 2021-04-25 NOTE — Telephone Encounter (Signed)
Please refer to 04/19/2021 phone note. Patient stated that nebulizer machine arrived on Friday. She did neb treatments over the weekend Q4H with no relief in sx.  She reports of prod cough with clear to yellow sputum, increased sob, chest tightness and wheezing.  Denied f/c/s. She is on spiriva and breo daily. Fully vaccinated against covid and flu.  Negative home covid test last week.  Dr. Jayme Cloud, please advise. Thanks

## 2021-04-25 NOTE — Telephone Encounter (Signed)
Rx for prednisone and zpak has been sent top referred pharmacy.  Pending OV for 05/17/21. Nothing further needed at this time.

## 2021-05-17 ENCOUNTER — Ambulatory Visit: Payer: BC Managed Care – PPO | Admitting: Pulmonary Disease

## 2021-05-17 ENCOUNTER — Encounter: Payer: Self-pay | Admitting: Pulmonary Disease

## 2021-05-17 ENCOUNTER — Other Ambulatory Visit: Payer: Self-pay

## 2021-05-17 VITALS — BP 138/82 | HR 66 | Temp 97.9°F | Ht 67.5 in | Wt 205.0 lb

## 2021-05-17 DIAGNOSIS — J4541 Moderate persistent asthma with (acute) exacerbation: Secondary | ICD-10-CM

## 2021-05-17 DIAGNOSIS — R059 Cough, unspecified: Secondary | ICD-10-CM

## 2021-05-17 DIAGNOSIS — G35 Multiple sclerosis: Secondary | ICD-10-CM

## 2021-05-17 DIAGNOSIS — R0602 Shortness of breath: Secondary | ICD-10-CM | POA: Diagnosis not present

## 2021-05-17 MED ORDER — NUZYRA 150 MG PO TABS: 2.0000 | ORAL_TABLET | Freq: Every day | ORAL | 0 refills | Status: DC

## 2021-05-17 MED ORDER — PREDNISONE 10 MG (21) PO TBPK: ORAL_TABLET | ORAL | 0 refills | Status: DC

## 2021-05-17 MED ORDER — MONTELUKAST SODIUM 10 MG PO TABS: 10.0000 mg | ORAL_TABLET | Freq: Every day | ORAL | 11 refills | Status: DC

## 2021-05-17 NOTE — Patient Instructions (Signed)
We are giving you an antibiotic called Riki Altes that will be 2 tablets daily, you will be getting the prescription through the specialty pharmacy.  We sent prescriptions for Singulair and prednisone taper to your pharmacy.  Continue Breo.  Let me know if they have tried to substitute it with generic.  We will see you in follow-up in 4 to 6 weeks time, you may see me or the nurse practitioner.  Since you are seeing Dr. Lorin Picket tomorrow I will let them do the chest x-ray in their facility.

## 2021-05-17 NOTE — Progress Notes (Signed)
Subjective:    Patient ID: Jackie White, female    DOB: 1956/08/01, 65 y.o.   MRN: [id number] Chief Complaint  Patient presents with   Follow-up    C/o prod cough with thick yellow sputum, wheezing and mild sob. Cough worsens at night.    HPI Married is a 65 year old lifelong never smoker who presents here for follow-up of cough, tenacious sputum production and dyspnea on exertion.  This is a scheduled visit.  Symptoms towards the end of May of increasing shortness of breath to the point that she could not take several steps before getting very short of breath.  She did not have fevers or chills at that time but the shortness of breath started quite suddenly.  She also has associated purulent sputum production.  The character of the sputum has changed somewhat.  On phone consultation on 6 June she was given prednisone and azithromycin.  This improve her symptoms somewhat however, these have worsened again.  Resents today with persistent symptoms.  She may be feeling somewhat better but continues to have significant shortness of breath.  Her cough sounds fairly congested today.  Recall that she has MS and is on Ocrevus.  She has had persistent cough since starting Ocrevus as well as persistent issues with upper and lower respiratory infections which is also a side effect of Ocrevus.  She has however reluctant to stop this medication because of her MS.  No other symptoms are voiced today.  DATA: 04/24/2019 2D echo: LVEF 60 to 65% mild diastolic dysfunction, mild increased LA size.  Moderate tricuspid regurg. 11/04/2019 PFTs: FEV1 2.14 L or 89% predicted FVC 2.51 L or 82% predicted, minimal response to bronchodilator (7% net change).  FEV1/FVC 85%, lung volumes normal.  Diffusion capacity normal by [facility]. 01/20/2021 PFTs: FEV1 1.56 L or 66% predicted, FVC 2.03 L or 67% predicted, FEV1/FVC 77%.  Mild restriction likely due to obesity (ERV 23%), significant small airways component with bronchodilator  reversibility.  Normal diffusion capacity by [facility].  Consistent with airways reactivity/asthma with concomitant restrictive physiology due to obesity  Review of Systems A 10 point review of systems was performed and it is as noted above otherwise negative.  Past Medical History:  Diagnosis Date   Abnormal Pap smear    Cervical disc disease    Cervical and lumbar disc disease   GERD (gastroesophageal reflux disease)    History of iron deficiency    Hypertension    Multiple sclerosis (HCC)    Personal history of colonic polyps    Reactive airway disease    Vitamin D deficiency    Social History   Tobacco Use   Smoking status: Never   Smokeless tobacco: Never  Substance Use Topics   Alcohol use: Yes    Alcohol/week: 0.0 standard drinks   No Known Allergies  Current Meds  Medication Sig   albuterol (PROVENTIL) (2.5 MG/3ML) 0.083% nebulizer solution Take 3 mLs (2.5 mg total) by nebulization every 4 (four) hours as needed for wheezing or shortness of breath.   albuterol (VENTOLIN HFA) 108 (90 Base) MCG/ACT inhaler Inhale 2 puffs into the lungs every 6 (six) hours as needed for wheezing or shortness of breath (cough).   ergocalciferol (VITAMIN D2) 1.25 MG (50000 UT) capsule Take 1 tablet by mouth daily.   esomeprazole (NEXIUM) 40 MG capsule TAKE 1 CAPSULE(40 MG) BY MOUTH DAILY   fluticasone furoate-vilanterol (BREO ELLIPTA) 200-25 MCG/INH AEPB Inhale 1 puff into the lungs daily.   metoprolol  succinate (TOPROL-XL) 25 MG 24 hr tablet TAKE 1 TABLET BY MOUTH DAILY   montelukast (SINGULAIR) 10 MG tablet Take 1 tablet (10 mg total) by mouth at bedtime.   Ocrelizumab (OCREVUS IV) Inject into the vein.   Olopatadine HCl 0.6 % SOLN Place 2 sprays into the nose in the morning and at bedtime.   rosuvastatin (CRESTOR) 5 MG tablet Take one tablet q Monday, Wednesday and Friday.   SUMAtriptan (IMITREX) 100 MG tablet Take 1 tablet by mouth as needed.   valsartan-hydrochlorothiazide (DIOVAN-HCT)  320-25 MG tablet TAKE 1 TABLET BY MOUTH DAILY   [DISCONTINUED] polyethylene glycol-electrolytes (NULYTELY) 420 g solution Prepare according to package instructions. Starting at 5:00 PM: Drink one 8 oz glass of mixture every 15 minutes until you finish half of the jug. Five hours prior to procedure, drink 8 oz glass of mixture every 15 minutes until it is all gone. Make sure you do not drink anything 4 hours prior to your procedure.   [DISCONTINUED] predniSONE (STERAPRED UNI-PAK 21 TAB) 10 MG (21) TBPK tablet Use as directed   Immunization History  Administered Date(s) Administered   Influenza Split 09/26/2012, 09/24/2013   Influenza, High Dose Seasonal PF 09/08/2019   Influenza,inj,Quad PF,6+ Mos 09/14/2020   Influenza-Unspecified 08/27/2014, 09/21/2015, 09/20/2016, 09/10/2018   PFIZER(Purple Top)SARS-COV-2 Vaccination 02/10/2020, 03/02/2020, 11/16/2020, 03/18/2021   Zoster Recombinat (Shingrix) 09/11/2019     Objective:   Physical Exam BP 138/82 (BP Location: Left Arm, Cuff Size: Normal)   Pulse 66   Temp 97.9 F (36.6 C) (Temporal)   Ht 5' 7.5" (1.715 m)   Wt 205 lb (93 kg)   LMP 12/27/2000   SpO2 100%   BMI 31.63 kg/m  GENERAL: Overweight woman, no acute distress.  Fully ambulatory.  No conversational dyspnea.  Occasional congested cough. HEAD: Normocephalic, atraumatic. EYES: Pupils equal, round, reactive to light.  No scleral icterus. MOUTH: Nose/mouth/throat not examined due to masking requirements for COVID 19. NECK: Supple. No thyromegaly. Trachea midline. No JVD.  No adenopathy. PULMONARY: Good air entry bilaterally.  Diffuse rhonchi and end expiratory wheezes very crackles right base.  CARDIOVASCULAR: S1 and S2. Regular rate and rhythm.  No rubs, murmurs or gallops heard. ABDOMEN: Benign. MUSCULOSKELETAL: No joint deformity, no clubbing, no edema. NEUROLOGIC: No focal deficit, no gait disturbance, speech is fluent. SKIN: Intact,warm,dry.  No rashes. PSYCH: Mood and  behavior normal     Assessment & Plan:     ICD-10-CM   1. Moderate persistent asthma with acute exacerbation in adult  J45.41    Persistent purulent sputum and wheezing Prednisone taper Nuzyra x7 days Continued issue due to Ocrevus     2. Shortness of breath  R06.02    Likely due to exacerbation Treat as above    3. Cough  R05.9    Poorly controlled asthma Ocrevus known side effect    4. Multiple sclerosis (HCC)  G35    On Ocrevus      Meds ordered this encounter  Medications   montelukast (SINGULAIR) 10 MG tablet    Sig: Take 1 tablet (10 mg total) by mouth daily.    Dispense:  30 tablet    Refill:  11   predniSONE (STERAPRED UNI-PAK 21 TAB) 10 MG (21) TBPK tablet    Sig: Take as directed in package    Dispense:  21 each    Refill:  0   Omadacycline Tosylate (NUZYRA) 150 MG TABS    Sig: Take 2 tablets by mouth daily.  Dispense:  10 tablet    Refill:  0    ATTN Brandon   Persistent issues with a prolonged exacerbation of asthma.  Unfortunately she is on Ocrevus and this will lead to more exacerbations and worsening of her cough symptoms.  She is however reluctant to discontinue this medication due to her underlying MS.  We will see her in follow-up in 4 to 6 weeks time she is to contact us prior to that time should any new difficulties arise.  She is to see Dr. Dale Timberlane tomorrow and she prefers to have a chest x-ray performed at her office to rule out infiltrate.  I do recommend that this is done.  We also discontinued Spiriva as she does not find it helpful.  Gailen Shelter, MD [facility]   *This note was dictated using voice recognition software/Dragon.  Despite best efforts to proofread, errors can occur which can change the meaning.  Any change was purely unintentional.

## 2021-05-18 ENCOUNTER — Encounter: Payer: Self-pay | Admitting: Pulmonary Disease

## 2021-05-18 ENCOUNTER — Encounter: Payer: Self-pay | Admitting: Internal Medicine

## 2021-05-18 ENCOUNTER — Encounter: Payer: BC Managed Care – PPO | Admitting: Internal Medicine

## 2021-05-18 NOTE — Telephone Encounter (Signed)
Pt called to follow up and would like a call back

## 2021-05-18 NOTE — Telephone Encounter (Signed)
Called patient back to discuss. Patient stated that when she was here earlier she did not decline doing the virtual, she was declining having [facility]. Patient stated that she does understand the policy for not being able to come into the office but also stated that she has been seen in the office before with same symptoms. Patient would like to do a virtual to do a follow up and discuss her routine care. Recently had colonoscopy, etc. I apologized for the misunderstanding and scheduled patient for 4:00 tomorrow. Patient understood and was not upset at end of call.

## 2021-05-18 NOTE — Telephone Encounter (Signed)
See other note.  Pt has f/u appt with me.

## 2021-05-18 NOTE — Telephone Encounter (Signed)
Called patient to discuss this message. Had to leave voicemail. Patient came into office today for physical. Was advised that per our policy, visit would have to be switched to a virtual visit. Pt had visit with pulmonary yesterday and was prescribed prednisone taper and abx to treat asthma exacerbation. She noted in that visit- productive cough- yellow phlegm, increased SOB, etc. Patient stated this is not COVID. Explained to patient that despite her having chronic issues, pulmonary is treating her for an acute exacerbation and the symptoms do not pass criteria to have in office visit. Also explained that we could switch her appointment to a virtual so that she could discuss any acute concerns and also do the cxr that Dr Jay[facility] Cloud [facility]. Patient declined virtual visit and stated that she would reschedule her physical.

## 2021-05-19 ENCOUNTER — Encounter: Payer: Self-pay | Admitting: Internal Medicine

## 2021-05-19 ENCOUNTER — Telehealth (INDEPENDENT_AMBULATORY_CARE_PROVIDER_SITE_OTHER): Payer: BC Managed Care – PPO | Admitting: Internal Medicine

## 2021-05-19 DIAGNOSIS — K219 Gastro-esophageal reflux disease without esophagitis: Secondary | ICD-10-CM | POA: Diagnosis not present

## 2021-05-19 DIAGNOSIS — I700 Atherosclerosis of aorta: Secondary | ICD-10-CM

## 2021-05-19 DIAGNOSIS — G35 Multiple sclerosis: Secondary | ICD-10-CM | POA: Diagnosis not present

## 2021-05-19 DIAGNOSIS — I10 Essential (primary) hypertension: Secondary | ICD-10-CM

## 2021-05-19 DIAGNOSIS — E7800 Pure hypercholesterolemia, unspecified: Secondary | ICD-10-CM

## 2021-05-19 DIAGNOSIS — R0602 Shortness of breath: Secondary | ICD-10-CM

## 2021-05-19 NOTE — Progress Notes (Signed)
Patient ID: Jackie White, female   DOB: 07-10-1956, 65 y.o.   MRN: [id number]   Virtual Visit via video Note  This visit type was conducted due to national recommendations for restrictions regarding the COVID-19 pandemic (e.g. social distancing).  This format is felt to be most appropriate for this patient at this time.  All issues noted in this document were discussed and addressed.  No physical exam was performed (except for noted visual exam findings with Video Visits).   I connected with Jackie White by a video enabled telemedicine application and verified that I am speaking with the correct person using two identifiers. Location patient: home Location provider: work  Persons participating in the virtual visit: patient, provider  The limitations, risks, security and privacy concerns of performing an evaluation and management service by video and the availability of in person appointments have been discussed.  It has also been discussed with the patient that there may be a patient responsible charge related to this service. The patient expressed understanding and agreed to proceed.   Reason for visit: scheduled follow up.   HPI: Follow up regarding her blood pressure and cough.  She saw Dr Jackie White 05/17/21.  Reported increased sob with increased cough and purulent sputum production.  Was given prednisone and azithromycin 04/25/21.  Symptoms improved.  Worsened again after completing.  On ocrevus for treatment of her MS.  Pulmonary questioning if the cough and persistent infectious symptoms are related to the ocrevus.  She has done well - regarding her MS on this medication.  Prescribed Nuzyra and prednisone taper.  Concern over possible pneumonia.  Needs a cxr.  Given above symptoms, her appt here was changed to virtual.  She reports no acute symptoms.  She has been dealing with the above symptoms for a while.  No chest pain.  No increased acid reflux.  No abdominal pain.  Bowels moving.       ROS: See pertinent positives and negatives per HPI.  Past Medical History:  Diagnosis Date   Abnormal Pap smear    Cervical disc disease    Cervical and lumbar disc disease   GERD (gastroesophageal reflux disease)    History of iron deficiency    Hypertension    Multiple sclerosis (HCC)    Personal history of colonic polyps    Reactive airway disease    Vitamin D deficiency     Past Surgical History:  Procedure Laterality Date   BREAST REDUCTION SURGERY     COLONOSCOPY WITH PROPOFOL N/A 03/16/2021   Procedure: COLONOSCOPY WITH PROPOFOL;  Surgeon: Pasty Spillers, MD;  Location: [facility] ENDOSCOPY;  Service: Endoscopy;  Laterality: N/A;   ESOPHAGOGASTRODUODENOSCOPY (EGD) WITH PROPOFOL N/A 03/16/2021   Procedure: ESOPHAGOGASTRODUODENOSCOPY (EGD) WITH PROPOFOL;  Surgeon: Pasty Spillers, MD;  Location: [facility] ENDOSCOPY;  Service: Endoscopy;  Laterality: N/A;   REDUCTION MAMMAPLASTY Bilateral 2002   TOTAL ABDOMINAL HYSTERECTOMY  2003   Right Oophrectomy    Family History  Problem Relation Age of Onset   Stroke Mother    Hypertension Mother    Heart disease Maternal Grandmother    Breast cancer Neg Hx     SOCIAL HX: reviewed.    Current Outpatient Medications:    albuterol (PROVENTIL) (2.5 MG/3ML) 0.083% nebulizer solution, Take 3 mLs (2.5 mg total) by nebulization every 4 (four) hours as needed for wheezing or shortness of breath., Disp: 120 mL, Rfl: 2   albuterol (VENTOLIN HFA) 108 (90 Base) MCG/ACT inhaler, Inhale 2 puffs into  the lungs every 6 (six) hours as needed for wheezing or shortness of breath (cough)., Disp: 18 g, Rfl: 6   ergocalciferol (VITAMIN D2) 1.25 MG (50000 UT) capsule, Take 1 tablet by mouth daily., Disp: , Rfl:    esomeprazole (NEXIUM) 40 MG capsule, TAKE 1 CAPSULE(40 MG) BY MOUTH DAILY, Disp: 90 capsule, Rfl: 1   fluticasone furoate-vilanterol (BREO ELLIPTA) 200-25 MCG/INH AEPB, Inhale 1 puff into the lungs daily., Disp: 14 each, Rfl: 0    metoprolol succinate (TOPROL-XL) 25 MG 24 hr tablet, TAKE 1 TABLET BY MOUTH DAILY, Disp: 90 tablet, Rfl: 1   montelukast (SINGULAIR) 10 MG tablet, Take 1 tablet (10 mg total) by mouth daily., Disp: 30 tablet, Rfl: 11   Ocrelizumab (OCREVUS IV), Inject into the vein., Disp: , Rfl:    Olopatadine HCl 0.6 % SOLN, Place 2 sprays into the nose in the morning and at bedtime., Disp: , Rfl:    Omadacycline Tosylate (NUZYRA) 150 MG TABS, Take 2 tablets by mouth daily., Disp: 10 tablet, Rfl: 0   rosuvastatin (CRESTOR) 5 MG tablet, Take one tablet q Monday, Wednesday and Friday., Disp: 39 tablet, Rfl: 1   SUMAtriptan (IMITREX) 100 MG tablet, Take 1 tablet by mouth as needed., Disp: , Rfl:    valsartan-hydrochlorothiazide (DIOVAN-HCT) 320-25 MG tablet, TAKE 1 TABLET BY MOUTH DAILY, Disp: 90 tablet, Rfl: 1  EXAM:  GENERAL: alert, oriented, appears well and in no acute distress  HEENT: atraumatic, conjunttiva clear, no obvious abnormalities on inspection of external nose and ears  NECK: normal movements of the head and neck  LUNGS: on inspection no signs of respiratory distress, breathing rate appears normal, no obvious gross SOB, gasping or wheezing  CV: no obvious cyanosis  PSYCH/NEURO: pleasant and cooperative, no obvious depression or anxiety, speech and thought processing grossly intact  ASSESSMENT AND PLAN:  Discussed the following assessment and plan:  Problem List Items Addressed This Visit     Aortic atherosclerosis (HCC)    Continue crestor.         Essential hypertension    Blood pressure has been doing well.  Continue metoprolol and diovan/hctz.  Follow pressures.  Follow metabolic panel.        GERD (gastroesophageal reflux disease)    No increased acid reflux reported.  Follow        Hypercholesterolemia    On crestor.  Tolerating.  Follow lipid panel and liver function tests.         Multiple sclerosis (HCC)    Receiving ocrevus.  Followed by neurology.  Has done  well on this medication.        Shortness of breath    Noticed increased sob recently with increased mucus production.  Saw pulmonary.  Being treated with nuzyra and prednisone.  Needs cxr.  Will arrange.  Keep f/u with pulmonary.  spiriva stopped.         Return if symptoms worsen or fail to improve.   I discussed the assessment and treatment plan with the patient. The patient was provided an opportunity to ask questions and all were answered. The patient agreed with the plan and demonstrated an understanding of the instructions.   The patient was advised to call back or seek an in-person evaluation if the symptoms worsen or if the condition fails to improve as anticipated.   Dale [place], MD

## 2021-05-23 ENCOUNTER — Encounter: Payer: Self-pay | Admitting: Internal Medicine

## 2021-05-23 NOTE — Assessment & Plan Note (Signed)
On crestor.  Tolerating.  Follow lipid panel and liver function tests.   

## 2021-05-23 NOTE — Assessment & Plan Note (Signed)
Receiving ocrevus.  Followed by neurology.  Has done well on this medication.  

## 2021-05-23 NOTE — Assessment & Plan Note (Signed)
Blood pressure has been doing well.  Continue metoprolol and diovan/hctz.  Follow pressures.  Follow metabolic panel.  

## 2021-05-23 NOTE — Assessment & Plan Note (Signed)
Noticed increased sob recently with increased mucus production.  Saw pulmonary.  Being treated with nuzyra and prednisone.  Needs cxr.  Will arrange.  Keep f/u with pulmonary.  spiriva stopped.

## 2021-05-23 NOTE — Assessment & Plan Note (Signed)
No increased acid reflux reported.  Follow.  

## 2021-05-23 NOTE — Assessment & Plan Note (Signed)
Continue crestor 

## 2021-05-25 ENCOUNTER — Telehealth: Payer: Self-pay | Admitting: Internal Medicine

## 2021-05-25 DIAGNOSIS — R059 Cough, unspecified: Secondary | ICD-10-CM

## 2021-05-25 NOTE — Telephone Encounter (Signed)
I have placed order for cxr.  Ok to come in - chronic cough and congestion.  Please call Ms Riester and schedule.

## 2021-05-25 NOTE — Telephone Encounter (Signed)
Pt scheduled  

## 2021-05-27 ENCOUNTER — Other Ambulatory Visit: Payer: BC Managed Care – PPO

## 2021-05-27 ENCOUNTER — Ambulatory Visit (INDEPENDENT_AMBULATORY_CARE_PROVIDER_SITE_OTHER): Payer: BC Managed Care – PPO

## 2021-05-27 ENCOUNTER — Other Ambulatory Visit: Payer: Self-pay

## 2021-05-27 DIAGNOSIS — R059 Cough, unspecified: Secondary | ICD-10-CM

## 2021-06-01 ENCOUNTER — Encounter: Payer: BC Managed Care – PPO | Admitting: Internal Medicine

## 2021-06-01 ENCOUNTER — Ambulatory Visit (INDEPENDENT_AMBULATORY_CARE_PROVIDER_SITE_OTHER): Payer: BC Managed Care – PPO | Admitting: Internal Medicine

## 2021-06-01 ENCOUNTER — Other Ambulatory Visit: Payer: Self-pay

## 2021-06-01 DIAGNOSIS — I10 Essential (primary) hypertension: Secondary | ICD-10-CM

## 2021-06-01 DIAGNOSIS — R059 Cough, unspecified: Secondary | ICD-10-CM

## 2021-06-01 DIAGNOSIS — K219 Gastro-esophageal reflux disease without esophagitis: Secondary | ICD-10-CM

## 2021-06-01 DIAGNOSIS — I700 Atherosclerosis of aorta: Secondary | ICD-10-CM | POA: Diagnosis not present

## 2021-06-01 DIAGNOSIS — E7800 Pure hypercholesterolemia, unspecified: Secondary | ICD-10-CM

## 2021-06-01 DIAGNOSIS — G35 Multiple sclerosis: Secondary | ICD-10-CM | POA: Diagnosis not present

## 2021-06-01 DIAGNOSIS — D649 Anemia, unspecified: Secondary | ICD-10-CM

## 2021-06-01 NOTE — Progress Notes (Signed)
Patient ID: Jackie White, female   DOB: Mar 14, 1956, 65 y.o.   MRN: [id number]   Subjective:    Patient ID: Jackie White, female    DOB: [birth year], 65 y.o.   MRN: [id number]  HPI This visit occurred during the SARS-CoV-2 public health emergency.  Safety protocols were in place, including screening questions prior to the visit, additional usage of staff PPE, and extensive cleaning of exam room while observing appropriate contact time as indicated for disinfecting solutions.   Patient here for her physical exam. She was recently reevaluated by pulmonary for sob and cough and congestion.  Treated with prednisone taper and nuzyra.  Feeling better.  Cough is better.  Feeling better than she has in a while.  No chest pain or sob reported.  No abdominal pain.  Bowels moving.  She is interested in knowing her blood type.   Marland Kitchen  Past Medical History:  Diagnosis Date   Abnormal Pap smear    Cervical disc disease    Cervical and lumbar disc disease   GERD (gastroesophageal reflux disease)    History of iron deficiency    Hypertension    Multiple sclerosis (HCC)    Personal history of colonic polyps    Reactive airway disease    Vitamin D deficiency    Past Surgical History:  Procedure Laterality Date   BREAST REDUCTION SURGERY     COLONOSCOPY WITH PROPOFOL N/A 03/16/2021   Procedure: COLONOSCOPY WITH PROPOFOL;  Surgeon: Pasty Spillers, MD;  Location: [facility] END[facility];  Service: Endoscopy;  Laterality: N/A;   ESOPHAGOGASTRODUODENOSCOPY (EGD) WITH PROPOFOL N/A 03/16/2021   Procedure: ESOPHAGOGASTRODUODENOSCOPY (EGD) WITH PROPOFOL;  Surgeon: Pasty Spillers, MD;  Location: [facility] ENDOSCOPY;  Service: Endoscopy;  Laterality: N/A;   REDUCTION MAMMAPLASTY Bilateral 2002   TOTAL ABDOMINAL HYSTERECTOMY  2003   Right Oophrectomy   Family History  Problem Relation Age of Onset   Stroke Mother    Hypertension Mother    Heart disease Maternal Grandmother    Breast cancer Neg Hx    Social  History   Socioeconomic History   Marital status: Married    Spouse name: Not on file   Number of children: Not on file   Years of education: Not on file   Highest education level: Not on file  Occupational History   Not on file  Tobacco Use   Smoking status: Never   Smokeless tobacco: Never  Vaping Use   Vaping Use: Never used  Substance and Sexual Activity   Alcohol use: Yes    Alcohol/week: 0.0 standard drinks   Drug use: No   Sexual activity: Not on file  Other Topics Concern   Not on file  Social History Narrative   Not on file   Social Determinants of Health   Financial Resource Strain: Not on file  Food Insecurity: Not on file  Transportation Needs: Not on file  Physical Activity: Not on file  Stress: Not on file  Social Connections: Not on file    Review of Systems  Constitutional:  Negative for appetite change and unexpected weight change.  HENT:  Negative for congestion and sinus pressure.   Respiratory:  Negative for chest tightness and shortness of breath.        Cough is better.    Cardiovascular:  Negative for chest pain, palpitations and leg swelling.  Gastrointestinal:  Negative for abdominal pain, diarrhea, nausea and vomiting.  Genitourinary:  Negative for difficulty urinating and dysuria.  Musculoskeletal:  Negative for joint swelling and myalgias.  Skin:  Negative for color change and rash.  Neurological:  Negative for dizziness, light-headedness and headaches.  Psychiatric/Behavioral:  Negative for agitation and dysphoric mood.       Objective:    Physical Exam Vitals reviewed.  Constitutional:      General: She is not in acute distress.    Appearance: Normal appearance.  HENT:     Head: Normocephalic and atraumatic.     Right Ear: External ear normal.     Left Ear: External ear normal.  Eyes:     General: No scleral icterus.       Right eye: No discharge.        Left eye: No discharge.     Conjunctiva/sclera: Conjunctivae normal.   Neck:     Thyroid: No thyromegaly.  Cardiovascular:     Rate and Rhythm: Normal rate and regular rhythm.  Pulmonary:     Effort: No respiratory distress.     Breath sounds: Normal breath sounds. No wheezing.  Abdominal:     General: Bowel sounds are normal.     Palpations: Abdomen is soft.     Tenderness: There is no abdominal tenderness.  Musculoskeletal:        General: No swelling or tenderness.     Cervical back: Neck supple. No tenderness.  Lymphadenopathy:     Cervical: No cervical adenopathy.  Skin:    Findings: No erythema or rash.  Neurological:     Mental Status: She is alert.  Psychiatric:        Mood and Affect: Mood normal.        Behavior: Behavior normal.    BP 120/80   Pulse (!) 52   Temp 98.2 F (36.8 C) (Oral)   Ht 5\' 8"  (1.727 m)   Wt 205 lb 6.4 oz (93.2 kg)   LMP 12/27/2000   SpO2 95%   BMI 31.23 kg/m  Wt Readings from Last 3 Encounters:  06/01/21 205 lb 6.4 oz (93.2 kg)  05/19/21 203 lb (92.1 kg)  05/17/21 205 lb (93 kg)    Outpatient Encounter Medications as of 06/01/2021  Medication Sig   albuterol (PROVENTIL) (2.5 MG/3ML) 0.083% nebulizer solution Take 3 mLs (2.5 mg total) by nebulization every 4 (four) hours as needed for wheezing or shortness of breath.   albuterol (VENTOLIN HFA) 108 (90 Base) MCG/ACT inhaler Inhale 2 puffs into the lungs every 6 (six) hours as needed for wheezing or shortness of breath (cough).   ergocalciferol (VITAMIN D2) 1.25 MG (50000 UT) capsule Take 1 tablet by mouth daily.   esomeprazole (NEXIUM) 40 MG capsule TAKE 1 CAPSULE(40 MG) BY MOUTH DAILY   fluticasone furoate-vilanterol (BREO ELLIPTA) 200-25 MCG/INH AEPB Inhale 1 puff into the lungs daily.   metoprolol succinate (TOPROL-XL) 25 MG 24 hr tablet TAKE 1 TABLET BY MOUTH DAILY   montelukast (SINGULAIR) 10 MG tablet Take 1 tablet (10 mg total) by mouth daily.   Ocrelizumab (OCREVUS IV) Inject into the vein.   Olopatadine HCl 0.6 % SOLN Place 2 sprays into the  nose in the morning and at bedtime.   rosuvastatin (CRESTOR) 5 MG tablet Take one tablet q Monday, Wednesday and Friday.   SUMAtriptan (IMITREX) 100 MG tablet Take 1 tablet by mouth as needed.   valsartan-hydrochlorothiazide (DIOVAN-HCT) 320-25 MG tablet TAKE 1 TABLET BY MOUTH DAILY   [DISCONTINUED] Omadacycline Tosylate (NUZYRA) 150 MG TABS Take 2 tablets by mouth daily.   No facility-administered encounter medications  on file as of 06/01/2021.     Lab Results  Component Value Date   WBC 6.9 09/14/2020   HGB 13.9 09/14/2020   HCT 42.0 09/14/2020   PLT 304.0 09/14/2020   GLUCOSE 83 09/14/2020   CHOL 147 02/10/2021   TRIG 67.0 02/10/2021   HDL 76.90 02/10/2021   LDLCALC 57 02/10/2021   ALT 11 09/14/2020   AST 13 09/14/2020   NA 141 09/14/2020   K 4.3 02/10/2021   CL 102 09/14/2020   CREATININE 0.81 09/14/2020   BUN 14 09/14/2020   CO2 33 (H) 09/14/2020   TSH 1.94 05/12/2020       Assessment & Plan:   Problem List Items Addressed This Visit     Anemia    Follow cbc.        Relevant Orders   CBC with Differential/Platelet   Aortic atherosclerosis (HCC)    Continue crestor.        Cough    Recently evaluated by pulmonary.  Treated with prednisone taper and nuzyra.  Cough is better.  Feeling better.  Follow.        Essential hypertension    Blood pressure has been doing well.  Continue metoprolol and diovan/hctz.  Follow pressures.  Follow metabolic panel.        Relevant Orders   TSH   Basic metabolic panel   GERD (gastroesophageal reflux disease)    No acid reflux reported.  On nexium.        Hypercholesterolemia    On crestor.  Low cholesterol diet and exercise.  Follow lipid panel and liver function tests.         Relevant Orders   Hepatic function panel   Lipid panel   Multiple sclerosis (HCC)    Receiving ocrevus.  Followed by neurology.  Has done well on this medication.          Dale Lakeland, MD

## 2021-06-07 ENCOUNTER — Other Ambulatory Visit: Payer: Self-pay | Admitting: Pulmonary Disease

## 2021-06-07 DIAGNOSIS — R059 Cough, unspecified: Secondary | ICD-10-CM

## 2021-06-12 ENCOUNTER — Encounter: Payer: Self-pay | Admitting: Internal Medicine

## 2021-06-12 NOTE — Assessment & Plan Note (Signed)
Receiving ocrevus.  Followed by neurology.  Has done well on this medication.  

## 2021-06-12 NOTE — Assessment & Plan Note (Signed)
On crestor.  Low cholesterol diet and exercise.  Follow lipid panel and liver function tests.   

## 2021-06-12 NOTE — Assessment & Plan Note (Signed)
Blood pressure has been doing well.  Continue metoprolol and diovan/hctz.  Follow pressures.  Follow metabolic panel.  

## 2021-06-12 NOTE — Assessment & Plan Note (Signed)
Continue crestor 

## 2021-06-12 NOTE — Assessment & Plan Note (Signed)
Follow cbc.  

## 2021-06-12 NOTE — Assessment & Plan Note (Signed)
Recently evaluated by pulmonary.  Treated with prednisone taper and nuzyra.  Cough is better.  Feeling better.  Follow.

## 2021-06-12 NOTE — Assessment & Plan Note (Signed)
No acid reflux reported.  On nexium.  

## 2021-06-29 ENCOUNTER — Ambulatory Visit: Payer: BC Managed Care – PPO | Admitting: Pulmonary Disease

## 2021-06-29 ENCOUNTER — Other Ambulatory Visit: Payer: Self-pay

## 2021-06-29 ENCOUNTER — Encounter: Payer: Self-pay | Admitting: Pulmonary Disease

## 2021-06-29 VITALS — BP 122/80 | HR 63 | Temp 97.0°F | Ht 67.5 in | Wt 206.0 lb

## 2021-06-29 DIAGNOSIS — J4541 Moderate persistent asthma with (acute) exacerbation: Secondary | ICD-10-CM | POA: Diagnosis not present

## 2021-06-29 DIAGNOSIS — G35 Multiple sclerosis: Secondary | ICD-10-CM | POA: Diagnosis not present

## 2021-06-29 DIAGNOSIS — J3089 Other allergic rhinitis: Secondary | ICD-10-CM

## 2021-06-29 MED ORDER — PREDNISONE 20 MG PO TABS: 20.0000 mg | ORAL_TABLET | Freq: Every day | ORAL | 0 refills | Status: AC

## 2021-06-29 MED ORDER — AZITHROMYCIN 250 MG PO TABS: ORAL_TABLET | ORAL | 0 refills | Status: AC

## 2021-06-29 NOTE — Patient Instructions (Signed)
Sent in a prescription for a Z-Pak and some prednisone.  Start taking Zyrtec 10 mg at bedtime  We will see you in follow-up in 2 months time call sooner should any new problems arise.

## 2021-06-29 NOTE — Progress Notes (Signed)
Subjective:    Patient ID: Jackie White, female    DOB: 1956/06/29, 65 y.o.   MRN: [id number] Chief Complaint  Patient presents with   Follow-up    Patient states that she has productive cough with thick yellow sputum that started about about a week ago. Denies shortness of breath.    HPI Jackie White is a 65 year old lifelong never smoker who presents here for follow-up of cough, tenacious being production and dyspnea on exertion.  She has moderate persistent asthma.  She has frequent exacerbations and her asthma control is likely aggravated by use of Ocrevus for multiple sclerosis.  She started having issues with persistent cough when she started Ocrevus.  This is also aggravated her lower respiratory infections.  She is reluctant to stop Ocrevus due to her MS.  She has been in good control of her MS with the Ocrevus.  We had not been able to start a biologic for her asthma due to interactions with Ocrevus namely decreasing immune system function.  She does have some modest IgG levels likely due to Ocrevus as well.  All of this adds complexity to her management.  Today she presents again with a minor flare, she has cough with thick yellow sputum started approximately a week ago shortness of breath is at baseline.  No fevers, chills or sweats.  No chest pain.  No other symptomatology.  DATA: 04/24/2019 2D echo: LVEF 60 to 65% mild diastolic dysfunction, mild increased LA size.  Moderate tricuspid regurg. 11/04/2019 PFTs: FEV1 2.14 L or 89% predicted FVC 2.51 L or 82% predicted, minimal response to bronchodilator (7% net change).  FEV1/FVC 85%, lung volumes normal.  Diffusion capacity normal by Kco. 01/20/2021 PFTs: FEV1 1.56 L or 66% predicted, FVC 2.03 L or 67% predicted, FEV1/FVC 77%.  Mild restriction likely due to obesity (ERV 23%), significant small airways component with bronchodilator reversibility.  Normal diffusion capacity by Kco.  Consistent with airways reactivity/asthma with concomitant  restrictive physiology due to obesity 01/25/2021 CBC: Noted to have eosinophilia at 12.2%  Review of Systems A 10 point review of systems was performed and it is as noted above otherwise negative.  Patient Active Problem List   Diagnosis Date Noted   History of colon polyps 03/10/2021   History of iron deficiency 03/10/2021   PUD (peptic ulcer disease) 03/10/2021   Aortic atherosclerosis (HCC) 01/24/2021   Skin lesion 09/19/2020   Cough variant asthma 11/19/2019   Abnormal EKG 02/21/2019   Family history of coronary artery disease 02/21/2019   Cough 01/05/2019   Shortness of breath 01/05/2019   Wheezing 07/21/2018   Impingement syndrome of left shoulder region 05/01/2017   Osteoarthritis of knee 05/01/2017   Trochanteric bursitis of right hip 05/01/2017   Left shoulder pain 04/24/2017   Right knee pain 04/24/2017   Hemorrhoids 08/02/2016   Low back pain 05/17/2016   Right hip pain 05/17/2016   Chest tightness 03/01/2016   Rash 01/31/2015   Health care maintenance 01/31/2015   Left knee pain 10/12/2013   Pain in joint involving lower leg 10/12/2013   GERD (gastroesophageal reflux disease) 12/29/2012   Abnormal Pap smear of cervix 12/29/2012   Essential hypertension 12/25/2012   Anemia 12/25/2012   Degeneration of lumbar or lumbosacral intervertebral disc 12/25/2012   Hypercholesterolemia 12/25/2012   Vitamin D deficiency 12/25/2012   Multiple sclerosis (HCC) 12/25/2012   No Known Allergies  Medications reviewed with the patient are as noted.  Immunization History  Administered Date(s) Administered  Influenza Split 09/26/2012, 09/24/2013   Influenza, High Dose Seasonal PF 09/08/2019   Influenza,inj,Quad PF,6+ Mos 09/14/2020   Influenza-Unspecified 08/27/2014, 09/21/2015, 09/20/2016, 09/10/2018   PFIZER(Purple Top)SARS-COV-2 Vaccination 02/10/2020, 03/02/2020, 11/16/2020, 03/18/2021   Zoster Recombinat (Shingrix) 09/11/2019       Objective:   Physical Exam BP  122/80 (BP Location: Left Arm, Patient Position: Sitting, Cuff Size: Normal)   Pulse 63   Temp (!) 97 F (36.1 C) (Oral)   Ht 5' 7.5" (1.715 m)   Wt 206 lb (93.4 kg)   LMP 12/27/2000   SpO2 98%   BMI 31.79 kg/m  GENERAL: Overweight woman, no acute distress.  Fully ambulatory.  No conversational dyspnea.  Occasional congested cough. HEAD: Normocephalic, atraumatic. EYES: Pupils equal, round, reactive to light.  No scleral icterus. MOUTH: Nose/mouth/throat not examined due to masking requirements for COVID 19. NECK: Supple. No thyromegaly. Trachea midline. No JVD.  No adenopathy. PULMONARY: Good air entry bilaterally.  Diffuse rhonchi and end expiratory wheezes very crackles right base.  CARDIOVASCULAR: S1 and S2. Regular rate and rhythm.  No rubs, murmurs or gallops heard. ABDOMEN: Benign. MUSCULOSKELETAL: No joint deformity, no clubbing, no edema. NEUROLOGIC: No focal deficit, no gait disturbance, speech is fluent. SKIN: Intact,warm,dry.  No rashes. PSYCH: Mood and behavior normal.     Assessment & Plan:     ICD-10-CM   1. Moderate persistent asthma with acute exacerbation in adult  J45.41    Continue her inhalers as they are Prednisone taper pack and Azithromycin for flare Start Zyrtec 10 mg at bedtime Follow-up in 2 months or as needed     2. Multiple sclerosis (HCC)  G35    This issue adds complexity to her management She is on Ocrevus which exacerbates respiratory issues Cannot put on asthma biologic due to Ocrevus use    3. Perennial allergic rhinitis  J30.89    Zyrtec at bedtime Nasal hygiene     Meds ordered this encounter  Medications   azithromycin (ZITHROMAX) 250 MG tablet    Sig: Take 2 tablets (500 mg) on  Day 1,  followed by 1 tablet (250 mg) once daily on Days 2 through 5.    Dispense:  6 each    Refill:  0   predniSONE (DELTASONE) 20 MG tablet    Sig: Take 1 tablet (20 mg total) by mouth daily with breakfast for 5 days.    Dispense:  5 tablet     Refill:  0   We will see the patient in follow-up in 2 months time she is to contact us prior to that time should any new difficulties arise.  Gailen Shelter, MD Advanced Bronchoscopy PCCM [facility]    *This note was dictated using voice recognition software/Dragon.  Despite best efforts to proofread, errors can occur which can change the meaning.  Any change was purely unintentional.

## 2021-07-19 ENCOUNTER — Other Ambulatory Visit: Payer: Self-pay | Admitting: Internal Medicine

## 2021-08-22 ENCOUNTER — Other Ambulatory Visit: Payer: Self-pay | Admitting: Internal Medicine

## 2021-09-22 ENCOUNTER — Encounter: Payer: Self-pay | Admitting: Pulmonary Disease

## 2021-09-22 ENCOUNTER — Ambulatory Visit (INDEPENDENT_AMBULATORY_CARE_PROVIDER_SITE_OTHER): Payer: BC Managed Care – PPO | Admitting: Pulmonary Disease

## 2021-09-22 ENCOUNTER — Other Ambulatory Visit: Payer: Self-pay

## 2021-09-22 VITALS — BP 134/80 | HR 65 | Temp 97.8°F | Ht 67.5 in | Wt 207.8 lb

## 2021-09-22 DIAGNOSIS — R053 Chronic cough: Secondary | ICD-10-CM | POA: Diagnosis not present

## 2021-09-22 DIAGNOSIS — G35 Multiple sclerosis: Secondary | ICD-10-CM | POA: Diagnosis not present

## 2021-09-22 DIAGNOSIS — J4540 Moderate persistent asthma, uncomplicated: Secondary | ICD-10-CM

## 2021-09-22 MED ORDER — PREDNISONE 10 MG (21) PO TBPK
ORAL_TABLET | ORAL | 0 refills | Status: DC
Start: 2021-09-22 — End: 2021-12-28

## 2021-09-22 NOTE — Patient Instructions (Signed)
At your request we sent a prednisone taper pack to your pharmacy.   Continue her medications as they currently are.   We will see him in follow-up in 3 months time call sooner should any new problems arise.

## 2021-09-22 NOTE — Progress Notes (Signed)
Subjective:    Patient ID: Jackie White, female    DOB: 11-27-1955, 65 y.o.   MRN: [id number] Chief Complaint  Patient presents with   Follow-up    C/o mild wheezing.     HPI Jackie White is 65 year old lifelong never smoker who presents for follow-up on the issue of moderate persistent asthma.  This is a scheduled visit.  Today she presents stating that she is actually doing well.  Recall that she had several exacerbations back-to-back in June and in August treated with prednisone and antibiotics, please refer to those notes.  Management has been complicated by need for Ocrevus for multiple sclerosis.  This likely makes her more prone to exacerbations and to cough.  We have been reluctant to start Biologics for her asthma due to need for Ocrevus.  She has not had any fevers, chills or sweats.  No hemoptysis.  Not had any recent flare.  Actually feels well today and looks well today.   Review of Systems A 10 point review of systems was performed and it is as noted above otherwise negative.   Current Meds  Medication Sig   albuterol (PROVENTIL) (2.5 MG/3ML) 0.083% nebulizer solution Take 3 mLs (2.5 mg total) by nebulization every 4 (four) hours as needed for wheezing or shortness of breath.   albuterol (VENTOLIN HFA) 108 (90 Base) MCG/ACT inhaler Inhale 2 puffs into the lungs every 6 (six) hours as needed for wheezing or shortness of breath (cough).   ergocalciferol (VITAMIN D2) 1.25 MG (50000 UT) capsule Take 1 tablet by mouth daily.   esomeprazole (NEXIUM) 40 MG capsule TAKE 1 CAPSULE(40 MG) BY MOUTH DAILY   fluticasone furoate-vilanterol (BREO ELLIPTA) 200-25 MCG/INH AEPB Inhale 1 puff into the lungs daily.   metoprolol succinate (TOPROL-XL) 25 MG 24 hr tablet TAKE 1 TABLET BY MOUTH DAILY   montelukast (SINGULAIR) 10 MG tablet Take 1 tablet (10 mg total) by mouth daily.   Ocrelizumab (OCREVUS IV) Inject into the vein.   rosuvastatin (CRESTOR) 5 MG tablet TAKE 1 TABLET BY MOUTH  EVERY MONDAY, WEDNESDAY AND FRIDAY   SUMAtriptan (IMITREX) 100 MG tablet Take 1 tablet by mouth as needed.   valsartan-hydrochlorothiazide (DIOVAN-HCT) 320-25 MG tablet TAKE 1 TABLET BY MOUTH DAILY        Objective:   Physical Exam BP 134/80 (BP Location: Left Arm, Cuff Size: Normal)   Pulse 65   Temp 97.8 F (36.6 C) (Temporal)   Ht 5' 7.5" (1.715 m)   Wt 207 lb 12.8 oz (94.3 kg)   LMP 12/27/2000   SpO2 99%   BMI 32.07 kg/m   GENERAL: Overweight woman, no acute distress.  Fully ambulatory.  No conversational dyspnea.  HEAD: Normocephalic, atraumatic. EYES: Pupils equal, round, reactive to light.  No scleral icterus. MOUTH: Nose/mouth/throat not examined due to masking requirements for COVID 19. NECK: Supple. No thyromegaly. Trachea midline. No JVD.  No adenopathy. PULMONARY: Good air entry bilaterally.  Clear lung fields no adventitious sounds.   CARDIOVASCULAR: S1 and S2. Regular rate and rhythm.  No rubs, murmurs or gallops heard. ABDOMEN: Benign. MUSCULOSKELETAL: No joint deformity, no clubbing, no edema. NEUROLOGIC: No focal deficit, no gait disturbance, speech is fluent. SKIN: Intact,warm,dry.  No rashes. PSYCH: Mood and behavior normal      Assessment & Plan:     ICD-10-CM   1. Moderate persistent asthma in adult without complication  J45.40    Continue Breo Ellipta 200/25, 1 inhalation daily Continue as needed albuterol  2. Multiple sclerosis (HCC)  G35    On Ocrevus This issue adds complexity to her management Ocrevus leads to more exacerbations/cough    3. Chronic cough  R05.3    Related to Ocrevus use Frequent asthma flares     Patient is doing well.  Continue medications as above.  Consider Riki Altes if has recurrent issues with flareups.  She has been reluctant to come off Ocrevus due to her multiple sclerosis.  Test prior may be a possibility hopefully not of hopefully not interacting with Ocrevus with regards to immunosuppression.  We will see  the patient in follow-up in 3 months time she is to contact us prior to that time should any new difficulties arise.  Gailen Shelter, MD Advanced Bronchoscopy PCCM [facility]    *This note was dictated using voice recognition software/Dragon.  Despite best efforts to proofread, errors can occur which can change the meaning.  Any change was purely unintentional.

## 2021-09-23 ENCOUNTER — Encounter: Payer: Self-pay | Admitting: Pulmonary Disease

## 2021-10-03 ENCOUNTER — Other Ambulatory Visit (INDEPENDENT_AMBULATORY_CARE_PROVIDER_SITE_OTHER): Payer: BC Managed Care – PPO

## 2021-10-03 ENCOUNTER — Other Ambulatory Visit: Payer: Self-pay

## 2021-10-03 DIAGNOSIS — E7800 Pure hypercholesterolemia, unspecified: Secondary | ICD-10-CM | POA: Diagnosis not present

## 2021-10-03 DIAGNOSIS — I10 Essential (primary) hypertension: Secondary | ICD-10-CM

## 2021-10-03 DIAGNOSIS — D649 Anemia, unspecified: Secondary | ICD-10-CM

## 2021-10-03 LAB — CBC WITH DIFFERENTIAL/PLATELET
Basophils Absolute: 0.1 10*3/uL (ref 0.0–0.1)
Basophils Relative: 0.9 % (ref 0.0–3.0)
Eosinophils Absolute: 1 10*3/uL — ABNORMAL HIGH (ref 0.0–0.7)
Eosinophils Relative: 17.3 % — ABNORMAL HIGH (ref 0.0–5.0)
HCT: 41.9 % (ref 36.0–46.0)
Hemoglobin: 13.3 g/dL (ref 12.0–15.0)
Lymphocytes Relative: 30.1 % (ref 12.0–46.0)
Lymphs Abs: 1.8 10*3/uL (ref 0.7–4.0)
MCHC: 31.7 g/dL (ref 30.0–36.0)
MCV: 86.8 fl (ref 78.0–100.0)
Monocytes Absolute: 0.6 10*3/uL (ref 0.1–1.0)
Monocytes Relative: 9.8 % (ref 3.0–12.0)
Neutro Abs: 2.5 10*3/uL (ref 1.4–7.7)
Neutrophils Relative %: 41.9 % — ABNORMAL LOW (ref 43.0–77.0)
Platelets: 267 10*3/uL (ref 150.0–400.0)
RBC: 4.83 Mil/uL (ref 3.87–5.11)
RDW: 14.3 % (ref 11.5–15.5)
WBC: 5.9 10*3/uL (ref 4.0–10.5)

## 2021-10-03 LAB — LIPID PANEL
Cholesterol: 159 mg/dL (ref 0–200)
HDL: 91.8 mg/dL (ref >39.00)
LDL Cholesterol: 56 mg/dL (ref 0–99)
NonHDL: 67.36
Total CHOL/HDL Ratio: 2
Triglycerides: 57 mg/dL (ref 0.0–149.0)
VLDL: 11.4 mg/dL (ref 0.0–40.0)

## 2021-10-03 LAB — HEPATIC FUNCTION PANEL
ALT: 15 U/L (ref 0–35)
AST: 15 U/L (ref 0–37)
Albumin: 4.4 g/dL (ref 3.5–5.2)
Alkaline Phosphatase: 84 U/L (ref 39–117)
Bilirubin, Direct: 0.2 mg/dL (ref 0.0–0.3)
Total Bilirubin: 0.9 mg/dL (ref 0.2–1.2)
Total Protein: 6.4 g/dL (ref 6.0–8.3)

## 2021-10-03 LAB — BASIC METABOLIC PANEL
BUN: 12 mg/dL (ref 6–23)
CO2: 34 mEq/L — ABNORMAL HIGH (ref 19–32)
Calcium: 9.7 mg/dL (ref 8.4–10.5)
Chloride: 102 mEq/L (ref 96–112)
Creatinine, Ser: 0.82 mg/dL (ref 0.40–1.20)
GFR: 76.13 mL/min (ref >60.00)
Glucose, Bld: 87 mg/dL (ref 70–99)
Potassium: 4.3 mEq/L (ref 3.5–5.1)
Sodium: 144 mEq/L (ref 135–145)

## 2021-10-03 LAB — TSH: TSH: 2.22 u[IU]/mL (ref 0.35–5.50)

## 2021-10-05 ENCOUNTER — Other Ambulatory Visit: Payer: Self-pay

## 2021-10-05 ENCOUNTER — Ambulatory Visit (INDEPENDENT_AMBULATORY_CARE_PROVIDER_SITE_OTHER): Payer: BC Managed Care – PPO | Admitting: Internal Medicine

## 2021-10-05 ENCOUNTER — Encounter: Payer: Self-pay | Admitting: Internal Medicine

## 2021-10-05 VITALS — BP 128/84 | HR 67 | Temp 95.0°F | Ht 67.52 in | Wt 206.6 lb

## 2021-10-05 DIAGNOSIS — D721 Eosinophilia, unspecified: Secondary | ICD-10-CM

## 2021-10-05 DIAGNOSIS — I700 Atherosclerosis of aorta: Secondary | ICD-10-CM | POA: Diagnosis not present

## 2021-10-05 DIAGNOSIS — G35 Multiple sclerosis: Secondary | ICD-10-CM

## 2021-10-05 DIAGNOSIS — K219 Gastro-esophageal reflux disease without esophagitis: Secondary | ICD-10-CM | POA: Diagnosis not present

## 2021-10-05 DIAGNOSIS — R062 Wheezing: Secondary | ICD-10-CM

## 2021-10-05 DIAGNOSIS — I10 Essential (primary) hypertension: Secondary | ICD-10-CM

## 2021-10-05 DIAGNOSIS — E7800 Pure hypercholesterolemia, unspecified: Secondary | ICD-10-CM

## 2021-10-05 DIAGNOSIS — D649 Anemia, unspecified: Secondary | ICD-10-CM

## 2021-10-05 DIAGNOSIS — Z86010 Personal history of colonic polyps: Secondary | ICD-10-CM

## 2021-10-05 MED ORDER — AZITHROMYCIN 250 MG PO TABS: ORAL_TABLET | ORAL | 0 refills | Status: AC

## 2021-10-05 MED ORDER — PREDNISONE 10 MG PO TABS: ORAL_TABLET | ORAL | 0 refills | Status: DC

## 2021-10-05 NOTE — Progress Notes (Signed)
Patient ID: Jackie White, female   DOB: 06-28-1956, 65 y.o.   MRN: [id number]   Subjective:    Patient ID: Jackie White, female    DOB: Mar 15, 1956, 65 y.o.   MRN: [id number]  This visit occurred during the SARS-CoV-2 public health emergency.  Safety protocols were in place, including screening questions prior to the visit, additional usage of staff PPE, and extensive cleaning of exam room while observing appropriate contact time as indicated for disinfecting solutions.   Patient here for a scheduled follow up.    HPI Here to follow up regarding - history of asthma, chronic cough and MS.  Being followed by Dr Elwyn Reach for her MS.  She has done well on ocrevus.  MS stable.  Reports plan - Dr Elwyn Reach to discuss with pulmonary regarding treatment  - given possible relation to respiratory symptoms. She was given a rx for trileptal to help with dystonic movements. Discussed.  She does report attending the light show this past weekend.  Was outside.  Reports increased congestion, wheezing  and cough - since.  Has been using nebulizer and rescue inhalers.  Cough productive of thick yellow mucus.  No vomiting.  No abdominal pain or bowel change reported.     Past Medical History:  Diagnosis Date   Abnormal Pap smear    Cervical disc disease    Cervical and lumbar disc disease   GERD (gastroesophageal reflux disease)    History of iron deficiency    Hypertension    Multiple sclerosis (HCC)    Personal history of colonic polyps    Reactive airway disease    Vitamin D deficiency    Past Surgical History:  Procedure Laterality Date   BREAST REDUCTION SURGERY     COLONOSCOPY WITH PROPOFOL N/A 03/16/2021   Procedure: COLONOSCOPY WITH PROPOFOL;  Surgeon: Pasty Spillers, MD;  Location: [facility];  Service: Endoscopy;  Laterality: N/A;   ESOPHAGOGASTRODUODENOSCOPY (EGD) WITH PROPOFOL N/A 03/16/2021   Procedure: ESOPHAGOGASTRODUODENOSCOPY (EGD) WITH PROPOFOL;  Surgeon: Pasty Spillers,  MD;  Location: [facility];  Service: Endoscopy;  Laterality: N/A;   REDUCTION MAMMAPLASTY Bilateral 2002   TOTAL ABDOMINAL HYSTERECTOMY  2003   Right Oophrectomy   Family History  Problem Relation Age of Onset   Stroke Mother    Hypertension Mother    Heart disease Maternal Grandmother    Breast cancer Neg Hx    Social History   Socioeconomic History   Marital status: Married    Spouse name: Not on file   Number of children: Not on file   Years of education: Not on file   Highest education level: Not on file  Occupational History   Not on file  Tobacco Use   Smoking status: Never   Smokeless tobacco: Never  Vaping Use   Vaping Use: Never used  Substance and Sexual Activity   Alcohol use: Yes    Alcohol/week: 0.0 standard drinks   Drug use: No   Sexual activity: Not on file  Other Topics Concern   Not on file  Social History Narrative   Not on file   Social Determinants of Health   Financial Resource Strain: Not on file  Food Insecurity: Not on file  Transportation Needs: Not on file  Physical Activity: Not on file  Stress: Not on file  Social Connections: Not on file     Review of Systems  Constitutional:  Negative for appetite change and unexpected weight change.  HENT:  Positive for  congestion. Negative for sinus pressure.   Respiratory:  Positive for cough and wheezing. Negative for chest tightness.   Cardiovascular:  Negative for chest pain and palpitations.  Gastrointestinal:  Negative for abdominal pain, diarrhea, nausea and vomiting.  Genitourinary:  Negative for difficulty urinating and dysuria.  Musculoskeletal:  Negative for joint swelling and myalgias.  Skin:  Negative for color change and rash.  Neurological:  Negative for dizziness, light-headedness and headaches.  Psychiatric/Behavioral:  Negative for agitation and dysphoric mood.       Objective:     BP 128/84   Pulse 67   Temp (!) 95 F (35 C)   Ht 5' 7.52" (1.715 m)   Wt 206 lb  9.6 oz (93.7 kg)   LMP 12/27/2000   SpO2 95%   BMI 31.86 kg/m  Wt Readings from Last 3 Encounters:  10/05/21 206 lb 9.6 oz (93.7 kg)  09/22/21 207 lb 12.8 oz (94.3 kg)  06/29/21 206 lb (93.4 kg)    Physical Exam Vitals reviewed.  Constitutional:      General: She is not in acute distress.    Appearance: Normal appearance.  HENT:     Head: Normocephalic and atraumatic.     Right Ear: External ear normal.     Left Ear: External ear normal.  Eyes:     General: No scleral icterus.       Right eye: No discharge.        Left eye: No discharge.     Conjunctiva/sclera: Conjunctivae normal.  Neck:     Thyroid: No thyromegaly.  Cardiovascular:     Rate and Rhythm: Normal rate and regular rhythm.  Pulmonary:     Effort: No respiratory distress.     Comments: Increased cough with forced expiration.  Wheezing.   Abdominal:     General: Bowel sounds are normal.     Palpations: Abdomen is soft.     Tenderness: There is no abdominal tenderness.  Musculoskeletal:        General: No swelling or tenderness.     Cervical back: Neck supple. No tenderness.  Lymphadenopathy:     Cervical: No cervical adenopathy.  Skin:    Findings: No erythema or rash.  Neurological:     Mental Status: She is alert.  Psychiatric:        Mood and Affect: Mood normal.        Behavior: Behavior normal.     Outpatient Encounter Medications as of 10/05/2021  Medication Sig   albuterol (PROVENTIL) (2.5 MG/3ML) 0.083% nebulizer solution Take 3 mLs (2.5 mg total) by nebulization every 4 (four) hours as needed for wheezing or shortness of breath.   albuterol (VENTOLIN HFA) 108 (90 Base) MCG/ACT inhaler Inhale 2 puffs into the lungs every 6 (six) hours as needed for wheezing or shortness of breath (cough).   azithromycin (ZITHROMAX) 250 MG tablet Take 2 tablets on day 1, then 1 tablet daily on days 2 through 5   ergocalciferol (VITAMIN D2) 1.25 MG (50000 UT) capsule Take 1 tablet by mouth daily.    esomeprazole (NEXIUM) 40 MG capsule TAKE 1 CAPSULE(40 MG) BY MOUTH DAILY   fluticasone furoate-vilanterol (BREO ELLIPTA) 200-25 MCG/INH AEPB Inhale 1 puff into the lungs daily.   metoprolol succinate (TOPROL-XL) 25 MG 24 hr tablet TAKE 1 TABLET BY MOUTH DAILY   montelukast (SINGULAIR) 10 MG tablet Take 1 tablet (10 mg total) by mouth daily.   Ocrelizumab (OCREVUS IV) Inject into the vein.   predniSONE (DELTASONE)  10 MG tablet Take 4 tablets x 1 day and then decrease by 1/2 tablet per day until down to zero mg.   predniSONE (STERAPRED UNI-PAK 21 TAB) 10 MG (21) TBPK tablet As directed in package.   rosuvastatin (CRESTOR) 5 MG tablet TAKE 1 TABLET BY MOUTH EVERY MONDAY, WEDNESDAY AND FRIDAY   SUMAtriptan (IMITREX) 100 MG tablet Take 1 tablet by mouth as needed.   valsartan-hydrochlorothiazide (DIOVAN-HCT) 320-25 MG tablet TAKE 1 TABLET BY MOUTH DAILY   No facility-administered encounter medications on file as of 10/05/2021.     Lab Results  Component Value Date   WBC 5.9 10/03/2021   HGB 13.3 10/03/2021   HCT 41.9 10/03/2021   PLT 267.0 10/03/2021   GLUCOSE 87 10/03/2021   CHOL 159 10/03/2021   TRIG 57.0 10/03/2021   HDL 91.80 10/03/2021   LDLCALC 56 10/03/2021   ALT 15 10/03/2021   AST 15 10/03/2021   NA 144 10/03/2021   K 4.3 10/03/2021   CL 102 10/03/2021   CREATININE 0.82 10/03/2021   BUN 12 10/03/2021   CO2 34 (H) 10/03/2021   TSH 2.22 10/03/2021      Assessment & Plan:   Problem List Items Addressed This Visit     Anemia    Follow cbc.       Aortic atherosclerosis (HCC)    Continue crestor.       Eosinophilia    Recheck cbc once above treated.       Relevant Orders   CBC with Differential/Platelet   Essential hypertension - Primary    Blood pressure has been doing well.  Continue metoprolol and diovan/hctz.  Follow pressures.  Follow metabolic panel.       GERD (gastroesophageal reflux disease)    No acid reflux reported.  On nexium.       History of  colon polyps    Colonoscopy 03/16/21 - hemorrhoids and diverticulosis.       Hypercholesterolemia    On crestor.  Low cholesterol diet and exercise.  Follow lipid panel and liver function tests.        Multiple sclerosis (HCC)    Receiving ocrevus.  Followed by neurology.  Has done well on this medication. Dr Elwyn Reach plans to discuss treatment with pulmonary.       Wheezing    Increased cough, congestion and wheezing as outlined.  Continue nebulizer treatments, inhalers.  Mucinex/delsym.  Prednisone taper as directed.  Zpak.  Follow.  Call with update.         Dale Deaver, MD

## 2021-10-10 ENCOUNTER — Encounter: Payer: Self-pay | Admitting: Internal Medicine

## 2021-10-10 DIAGNOSIS — D721 Eosinophilia, unspecified: Secondary | ICD-10-CM | POA: Insufficient documentation

## 2021-10-10 NOTE — Assessment & Plan Note (Signed)
Blood pressure has been doing well.  Continue metoprolol and diovan/hctz.  Follow pressures.  Follow metabolic panel.  

## 2021-10-10 NOTE — Assessment & Plan Note (Signed)
Recheck cbc once above treated.

## 2021-10-10 NOTE — Assessment & Plan Note (Signed)
Continue crestor 

## 2021-10-10 NOTE — Assessment & Plan Note (Signed)
Follow cbc.  

## 2021-10-10 NOTE — Assessment & Plan Note (Signed)
Increased cough, congestion and wheezing as outlined.  Continue nebulizer treatments, inhalers.  Mucinex/delsym.  Prednisone taper as directed.  Zpak.  Follow.  Call with update.

## 2021-10-10 NOTE — Assessment & Plan Note (Signed)
No acid reflux reported.  On nexium.  

## 2021-10-10 NOTE — Assessment & Plan Note (Signed)
Colonoscopy 03/16/21 - hemorrhoids and diverticulosis.  

## 2021-10-10 NOTE — Assessment & Plan Note (Signed)
On crestor.  Low cholesterol diet and exercise.  Follow lipid panel and liver function tests.   

## 2021-10-10 NOTE — Assessment & Plan Note (Signed)
Receiving ocrevus.  Followed by neurology.  Has done well on this medication. Dr Elwyn Reach plans to discuss treatment with pulmonary.

## 2021-10-11 ENCOUNTER — Ambulatory Visit: Payer: BC Managed Care – PPO | Admitting: Cardiovascular Disease

## 2021-10-11 ENCOUNTER — Encounter: Payer: Self-pay | Admitting: Cardiovascular Disease

## 2021-10-11 ENCOUNTER — Other Ambulatory Visit: Payer: Self-pay

## 2021-10-11 VITALS — BP 112/78 | HR 55 | Ht 67.5 in | Wt 212.2 lb

## 2021-10-11 DIAGNOSIS — I10 Essential (primary) hypertension: Secondary | ICD-10-CM

## 2021-10-11 DIAGNOSIS — G35 Multiple sclerosis: Secondary | ICD-10-CM | POA: Diagnosis not present

## 2021-10-11 DIAGNOSIS — R0602 Shortness of breath: Secondary | ICD-10-CM

## 2021-10-11 DIAGNOSIS — E7800 Pure hypercholesterolemia, unspecified: Secondary | ICD-10-CM | POA: Diagnosis not present

## 2021-10-11 NOTE — Progress Notes (Signed)
Cardiology Office Note  Date:  10/11/2021   ID:  Jackie White, Jackie White 09/26/1956, MRN [id number]  PCP:  Dale Sneads, MD   Chief Complaint  Patient presents with   12 month follow up     "Doing well." Medications reviewed by the patient verbally.      HPI:  Jackie White is a 65 year-old woman with past medical history of Jackie, stable on infusions every 6 months,  On ocrelizumab Anemia Chronic cough, better on singulair GERD CT coronary calcium scoring of 0, no other abnormalities on CT scan Who presents for follow-up of her SOB (seen on 12/31/2018), leg swelling  LOV 05/2020  CT scan March 2022 reviewed Minimal aortic athero on CT, minimal in the arch Images pulled up and reviewed with her  Discussed risk factors Nonsmoker Nondiabetic  Stable from Jackie, "Little respiratory"  Active Knee pain  EKG personally reviewed by myself on todays visit Nsr rate 55 bpm no ST or T wave changes  Other past medical history reviewed Last seen in clinic July 2020 She reported periodic leg swelling, was sitting for long periods of time at work, also on her feet all day at times, 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  PSH:    Past Surgical History:  Procedure Laterality Date   BREAST REDUCTION SURGERY     COLONOSCOPY WITH PROPOFOL N/A 03/16/2021   Procedure: COLONOSCOPY WITH PROPOFOL;  Surgeon: Pasty Spillers, MD;  Location: [facility];  Service:  Endoscopy;  Laterality: N/A;   ESOPHAGOGASTRODUODENOSCOPY (EGD) WITH PROPOFOL N/A 03/16/2021   Procedure: ESOPHAGOGASTRODUODENOSCOPY (EGD) WITH PROPOFOL;  Surgeon: Pasty Spillers, MD;  Location: [facility];  Service: Endoscopy;  Laterality: N/A;   REDUCTION MAMMAPLASTY Bilateral 2002   TOTAL ABDOMINAL HYSTERECTOMY  2003   Right Oophrectomy    Current Outpatient Medications  Medication Sig Dispense Refill   albuterol (PROVENTIL) (2.5 MG/3ML) 0.083% nebulizer solution Take 3 mLs (2.5 mg total) by nebulization every 4 (four) hours as needed for wheezing or shortness of breath. 120 mL 2   albuterol (VENTOLIN HFA) 108 (90 Base) MCG/ACT inhaler Inhale 2 puffs into the lungs every 6 (six) hours as needed for wheezing or shortness of breath (cough). 18 g 6   ergocalciferol (VITAMIN D2) 1.25 MG (50000 UT) capsule Take 1 tablet by mouth daily.     esomeprazole (NEXIUM) 40 MG capsule TAKE 1 CAPSULE(40 MG) BY MOUTH DAILY 90 capsule 1   fluticasone furoate-vilanterol (BREO ELLIPTA) 200-25 MCG/INH AEPB Inhale 1 puff into the lungs daily. 14 each 0   metoprolol succinate (TOPROL-XL) 25 MG 24 hr tablet TAKE 1 TABLET BY MOUTH DAILY 90 tablet 1   montelukast (SINGULAIR) 10 MG tablet Take 1 tablet (10 mg total) by mouth daily. 30 tablet 11   Ocrelizumab (OCREVUS IV) Inject into the vein.     Oxcarbazepine (TRILEPTAL) 300 MG tablet Take 300 mg by mouth at bedtime as needed.     predniSONE (DELTASONE) 10 MG tablet Take 4 tablets x 1 day and then  decrease by 1/2 tablet per day until down to zero mg. 18 tablet 0   predniSONE (STERAPRED UNI-PAK 21 TAB) 10 MG (21) TBPK tablet As directed in package. 21 tablet 0   rosuvastatin (CRESTOR) 5 MG tablet TAKE 1 TABLET BY MOUTH EVERY MONDAY, WEDNESDAY AND FRIDAY 39 tablet 1   SUMAtriptan (IMITREX) 100 MG tablet Take 1 tablet by mouth as needed.     valsartan-hydrochlorothiazide (DIOVAN-HCT) 320-25 MG tablet TAKE 1 TABLET BY MOUTH DAILY 90 tablet 1   No current  facility-administered medications for this visit.     Allergies:   Patient has no known allergies.   Social History:  The patient  reports that she has never smoked. She has never used smokeless tobacco. She reports current alcohol use. She reports that she does not use drugs.   Family History:   family history includes Heart disease in her maternal grandmother; Hypertension in her mother; Stroke in her mother.    Review of Systems: Review of Systems  Constitutional: Negative.   HENT: Negative.    Respiratory: Negative.    Cardiovascular: Negative.   Gastrointestinal: Negative.   Musculoskeletal: Negative.   Neurological: Negative.   Psychiatric/Behavioral: Negative.    All other systems reviewed and are negative.  PHYSICAL EXAM: VS:  BP 112/78 (BP Location: Left Arm, Patient Position: Sitting, Cuff Size: Normal)   Pulse (!) 55   Ht 5' 7.5" (1.715 m)   Wt 212 lb 4 oz (96.3 kg)   LMP 12/27/2000   SpO2 98%   BMI 32.75 kg/m  , BMI Body mass index is 32.75 kg/m. Constitutional:  oriented to person, place, and time. No distress.  HENT:  Head: Grossly normal Eyes:  no discharge. No scleral icterus.  Neck: No JVD, no carotid bruits  Cardiovascular: Regular rate and rhythm, no murmurs appreciated Pulmonary/Chest: Clear to auscultation bilaterally, no wheezes or rails Abdominal: Soft.  no distension.  no tenderness.  Musculoskeletal: Normal range of motion Neurological:  normal muscle tone. Coordination normal. No atrophy Skin: Skin warm and dry Psychiatric: normal affect, pleasant   Recent Labs: 10/03/2021: ALT 15; BUN 12; Creatinine, Ser 0.82; Hemoglobin 13.3; Platelets 267.0; Potassium 4.3; Sodium 144; TSH 2.22    Lipid Panel Lab Results  Component Value Date   CHOL 159 10/03/2021   HDL 91.80 10/03/2021   LDLCALC 56 10/03/2021   TRIG 57.0 10/03/2021      Wt Readings from Last 3 Encounters:  10/11/21 212 lb 4 oz (96.3 kg)  10/05/21 206 lb 9.6 oz (93.7 kg)   09/22/21 207 lb 12.8 oz (94.3 kg)      ASSESSMENT AND PLAN:  Problem List Items Addressed This Visit       Cardiology Problems   Essential hypertension - Primary   Relevant Orders   EKG 12-Lead   Hypercholesterolemia   Relevant Orders   EKG 12-Lead     Other   Multiple sclerosis (HCC)   Shortness of breath   Relevant Orders   EKG 12-Lead  Shortness of breath Stay active, EKG normal  Essential hypertension Blood pressure is well controlled on today's visit. No changes made to the medications.  Jackie Has worked with physical therapy, has chronic leg weakness Knee pain, doing aqua therapy stable  Aortic athero Minimal athero, in the arch On crestor M/W/F  Hyperlipidemia Cholesterol is at goal on the current lipid regimen. No changes to the medications were made.     Total encounter time more than 25 minutes  Greater than  50% was spent in counseling and coordination of care with the patient   Orders Placed This Encounter  Procedures   EKG 12-Lead      Signed, Dossie Arbour, M.D., Ph.D. 10/11/2021  [facility] [place], [place] [phone]

## 2021-10-11 NOTE — Patient Instructions (Addendum)
Medication Instructions:  No changes  If you need a refill on your cardiac medications before your next appointment, please call your pharmacy.   Lab work: No new labs needed  Testing/Procedures: No new testing needed  Follow-Up: At [facility], you and your health needs are our priority.  As part of our continuing mission to provide you with exceptional heart care, we have created designated Provider Care Teams.  These Care Teams include your primary Cardiologist (physician) and Advanced Practice Providers (APPs -  Physician Assistants and Nurse Practitioners) who all work together to provide you with the care you need, when you need it.  You will need a follow up appointment as needed  Providers on your designated Care Team:   Christopher Berge, NP Ryan Dunn, PA-C Cadence Furth, PA-C  COVID-19 Vaccine Information can be found at: [email or web address] For questions related to vaccine distribution or appointments, please email [email or web address] or call [phone].    

## 2021-10-24 ENCOUNTER — Telehealth: Payer: Self-pay | Admitting: Pulmonary Disease

## 2021-10-24 MED ORDER — DOXYCYCLINE HYCLATE 100 MG PO TABS: 100.0000 mg | ORAL_TABLET | Freq: Two times a day (BID) | ORAL | 0 refills | Status: DC

## 2021-10-24 NOTE — Telephone Encounter (Signed)
Patient is aware of recommendations and voiced her understanding.  Doxy has been sent to preferred pharmacy.  Appt scheduled 11/17/2021 at 1:30, as this is first available.  Nothing further needed at this time.

## 2021-10-24 NOTE — Telephone Encounter (Signed)
Continue prednisone taper, doxycycline 100 mg twice daily x7 days.  Schedule follow-up with me or nurse practitioner 1 to 2 weeks.  If symptoms worsen of course should go to the ED.

## 2021-10-24 NOTE — Telephone Encounter (Signed)
Spoke to patient.  Patient stated that she developed asthma flare three days ago.  C/o sob with exertion, dry cough at times prod with green sputum, wheezing Denied f/c/s or additional sx. She is taking Breo once daily, albuterol solution Q4H and ventolin 5x daily with some relief in sx. Recent course of abx and prednisone 10/06/2021. She has prednisone taper on hand. She started taper this morning. Scheduled for flu shot 10/26/2021. Fully vaccinated against covid and flu.  Dr. Jayme Cloud, please advise.

## 2021-10-25 ENCOUNTER — Encounter: Payer: Self-pay | Admitting: Internal Medicine

## 2021-10-25 ENCOUNTER — Other Ambulatory Visit: Payer: Self-pay | Admitting: Internal Medicine

## 2021-10-25 DIAGNOSIS — Z1231 Encounter for screening mammogram for malignant neoplasm of breast: Secondary | ICD-10-CM

## 2021-10-26 ENCOUNTER — Other Ambulatory Visit: Payer: BC Managed Care – PPO

## 2021-10-26 ENCOUNTER — Ambulatory Visit: Payer: BC Managed Care – PPO

## 2021-11-02 ENCOUNTER — Ambulatory Visit
Admission: RE | Admit: 2021-11-02 | Discharge: 2021-11-02 | Disposition: A | Discharge status: Home / Self Care | Payer: BC Managed Care – PPO | Source: Ambulatory Visit | Attending: Internal Medicine | Admitting: Internal Medicine

## 2021-11-02 ENCOUNTER — Other Ambulatory Visit: Payer: Self-pay

## 2021-11-02 DIAGNOSIS — Z1231 Encounter for screening mammogram for malignant neoplasm of breast: Secondary | ICD-10-CM | POA: Diagnosis present

## 2021-11-10 ENCOUNTER — Other Ambulatory Visit: Payer: BC Managed Care – PPO

## 2021-11-10 ENCOUNTER — Other Ambulatory Visit: Payer: Self-pay

## 2021-11-10 ENCOUNTER — Ambulatory Visit (INDEPENDENT_AMBULATORY_CARE_PROVIDER_SITE_OTHER): Payer: BC Managed Care – PPO

## 2021-11-10 DIAGNOSIS — Z23 Encounter for immunization: Secondary | ICD-10-CM

## 2021-11-10 DIAGNOSIS — D721 Eosinophilia, unspecified: Secondary | ICD-10-CM

## 2021-11-11 LAB — CBC WITH DIFFERENTIAL/PLATELET
Basophils Absolute: 0.1 10*3/uL (ref 0.0–0.1)
Basophils Relative: 0.9 % (ref 0.0–3.0)
Eosinophils Absolute: 0.3 10*3/uL (ref 0.0–0.7)
Eosinophils Relative: 4.5 % (ref 0.0–5.0)
HCT: 39.1 % (ref 36.0–46.0)
Hemoglobin: 12.7 g/dL (ref 12.0–15.0)
Lymphocytes Relative: 34.7 % (ref 12.0–46.0)
Lymphs Abs: 2.2 10*3/uL (ref 0.7–4.0)
MCHC: 32.4 g/dL (ref 30.0–36.0)
MCV: 86.7 fl (ref 78.0–100.0)
Monocytes Absolute: 0.7 10*3/uL (ref 0.1–1.0)
Monocytes Relative: 11.2 % (ref 3.0–12.0)
Neutro Abs: 3.1 10*3/uL (ref 1.4–7.7)
Neutrophils Relative %: 48.7 % (ref 43.0–77.0)
Platelets: 283 10*3/uL (ref 150.0–400.0)
RBC: 4.51 Mil/uL (ref 3.87–5.11)
RDW: 15.3 % (ref 11.5–15.5)
WBC: 6.4 10*3/uL (ref 4.0–10.5)

## 2021-11-17 ENCOUNTER — Other Ambulatory Visit: Payer: Self-pay

## 2021-11-17 ENCOUNTER — Ambulatory Visit: Payer: BC Managed Care – PPO | Admitting: Adult Health

## 2021-11-17 ENCOUNTER — Encounter: Payer: Self-pay | Admitting: Adult Health

## 2021-11-17 DIAGNOSIS — J4550 Severe persistent asthma, uncomplicated: Secondary | ICD-10-CM

## 2021-11-17 DIAGNOSIS — D721 Eosinophilia, unspecified: Secondary | ICD-10-CM | POA: Diagnosis not present

## 2021-11-17 DIAGNOSIS — J45909 Unspecified asthma, uncomplicated: Secondary | ICD-10-CM | POA: Insufficient documentation

## 2021-11-17 DIAGNOSIS — J8283 Eosinophilic asthma: Secondary | ICD-10-CM | POA: Insufficient documentation

## 2021-11-17 NOTE — Assessment & Plan Note (Signed)
Recent lab work shows significant eosinophilia improved and back to baseline after course of antibiotics and steroids.  This was during an acute illness.  Patient is returned back to clinical baseline.  Continue to monitor

## 2021-11-17 NOTE — Patient Instructions (Addendum)
Continue on BREO daily , take in am .  Restart Singulair 10mg  At bedtime   Restart Zyrtec 10mg  At bedtime   Albuterol inhaler 1-2 puffs every 6hrs as needed  Asthma action plan .  Follow up with Dr in 6 weeks as planned and As needed

## 2021-11-17 NOTE — Progress Notes (Signed)
@Patient  ID: Jackie White, female    DOB: 1956/10/03, 65 y.o.   MRN: [id number]  Chief Complaint  Patient presents with   Follow-up    Referring provider: Einar Pheasant, MD  HPI: 65 year old female never smoker followed for allergic asthma, chronic cough Medical history significant for multiple sclerosis on Ocrevus  TEST/EVENTS :  Pulmonary function testing December 2020 showed FEV1 at 89%, no significant bronchodilator response.  Cardiac CT May 21, 2019 showed clear lungs. Hypersensitivity pneumonitis panel was negative. IgE was 24., Allergy profile mildly positive for grass, ragweed and tree. Labs have showed elevated eosinophils last visit eosinophils increased from 300-600 absolute count  11/17/2021 Follow up : Asthma  Patient returns for a follow-up visit.  Patient has underlying allergic asthma and chronic cough.  She is recently had a slow to resolve asthmatic bronchitic exacerbation.  She was seen by her primary care provider and given an antibiotic and steroid taper.  She continued to have some ongoing cough and congestion.  She was called in a 7-day course of doxycycline. She remains on Breo and Singulair.  Patient says overall she is feeling much better . Cough and congestion are improved. Breathing is back to baseline. She is feeling the best she has in a while . She had stopped Singulair and Zyrtec for a while.  We talked about the importance of keeping on her maintenance regimen.  Lab work recently showed elevated eosinophils during acute illness.  After she was treated with antibiotics and steroids.  Eosinophils went back to baseline on most recent labs. She remains active.  Says that her MS is under good control.  Tried Trelegy in past , did not tolerate.    No Known Allergies  Immunization History  Administered Date(s) Administered   Influenza Split 09/26/2012, 09/24/2013   Influenza, High Dose Seasonal PF 09/08/2019   Influenza,inj,Quad PF,6+ Mos  09/14/2020, 11/10/2021   Influenza-Unspecified 08/27/2014, 09/21/2015, 09/20/2016, 09/10/2018   PFIZER(Purple Top)SARS-COV-2 Vaccination 02/10/2020, 03/02/2020, 11/16/2020, 03/18/2021   Pfizer Covid-19 Vaccine Bivalent Booster 49yrs & up 08/25/2021   Zoster Recombinat (Shingrix) 09/11/2019    Past Medical History:  Diagnosis Date   Abnormal Pap smear    Cervical disc disease    Cervical and lumbar disc disease   GERD (gastroesophageal reflux disease)    History of iron deficiency    Hypertension    Multiple sclerosis ([place])    Personal history of colonic polyps    Reactive airway disease    Vitamin D deficiency     Tobacco History: Social History   Tobacco Use  Smoking Status Never  Smokeless Tobacco Never   Counseling given: Not Answered   Outpatient Medications Prior to Visit  Medication Sig Dispense Refill   albuterol (PROVENTIL) (2.5 MG/3ML) 0.083% nebulizer solution Take 3 mLs (2.5 mg total) by nebulization every 4 (four) hours as needed for wheezing or shortness of breath. 120 mL 2   albuterol (VENTOLIN HFA) 108 (90 Base) MCG/ACT inhaler Inhale 2 puffs into the lungs every 6 (six) hours as needed for wheezing or shortness of breath (cough). 18 g 6   doxycycline (VIBRA-TABS) 100 MG tablet Take 1 tablet (100 mg total) by mouth 2 (two) times daily. 14 tablet 0   ergocalciferol (VITAMIN D2) 1.25 MG (50000 UT) capsule Take 1 tablet by mouth daily.     esomeprazole (NEXIUM) 40 MG capsule TAKE 1 CAPSULE(40 MG) BY MOUTH DAILY 90 capsule 1   fluticasone furoate-vilanterol (BREO ELLIPTA) 200-25 MCG/INH AEPB Inhale 1 puff  into the lungs daily. 14 each 0   metoprolol succinate (TOPROL-XL) 25 MG 24 hr tablet TAKE 1 TABLET BY MOUTH DAILY 90 tablet 1   metoprolol succinate (TOPROL-XL) 25 MG 24 hr tablet Take 1 tablet by mouth daily.     montelukast (SINGULAIR) 10 MG tablet Take 1 tablet (10 mg total) by mouth daily. 30 tablet 11   Ocrelizumab (OCREVUS IV) Inject into the vein.      Oxcarbazepine (TRILEPTAL) 300 MG tablet Take 300 mg by mouth at bedtime as needed.     Oxcarbazepine (TRILEPTAL) 300 MG tablet Take by mouth.     predniSONE (STERAPRED UNI-PAK 21 TAB) 10 MG (21) TBPK tablet As directed in package. 21 tablet 0   predniSONE (STERAPRED UNI-PAK 21 TAB) 10 MG (21) TBPK tablet Take 1 tablet by mouth daily.     rosuvastatin (CRESTOR) 5 MG tablet TAKE 1 TABLET BY MOUTH EVERY MONDAY, WEDNESDAY AND FRIDAY 39 tablet 1   SUMAtriptan (IMITREX) 100 MG tablet Take 1 tablet by mouth as needed.     valsartan-hydrochlorothiazide (DIOVAN-HCT) 320-25 MG tablet TAKE 1 TABLET BY MOUTH DAILY 90 tablet 1   rosuvastatin (CRESTOR) 5 MG tablet Take by mouth.     No facility-administered medications prior to visit.     Review of Systems:   Constitutional:   No  weight loss, night sweats,  Fevers, chills,  +fatigue, or  lassitude.  HEENT:   No headaches,  Difficulty swallowing,  Tooth/dental problems, or  Sore throat,                No sneezing, itching, ear ache, nasal congestion, post nasal drip,   CV:  No chest pain,  Orthopnea, PND, swelling in lower extremities, anasarca, dizziness, palpitations, syncope.   GI  No heartburn, indigestion, abdominal pain, nausea, vomiting, diarrhea, change in bowel habits, loss of appetite, bloody stools.   Resp:   No chest wall deformity  Skin: no rash or lesions.  GU: no dysuria, change in color of urine, no urgency or frequency.  No flank pain, no hematuria   MS:  No joint pain or swelling.  No decreased range of motion.  No back pain.    Physical Exam  BP 120/70 (BP Location: Left Arm, Patient Position: Sitting, Cuff Size: Normal)    Pulse 60    Temp (!) 97.1 F (36.2 C) (Oral)    Ht 5\' 7"  (1.702 m)    Wt 212 lb (96.2 kg)    LMP 12/27/2000    SpO2 98%    BMI 33.20 kg/m   GEN: A/Ox3; pleasant , NAD, well nourished    HEENT:  [place]/AT,  NOSE-clear, THROAT-clear, no lesions, no postnasal drip or exudate noted.   NECK:  Supple w/  fair ROM; no JVD; normal carotid impulses w/o bruits; no thyromegaly or nodules palpated; no lymphadenopathy.    RESP  Clear  P & A; w/o, wheezes/ rales/ or rhonchi. no accessory muscle use, no dullness to percussion  CARD:  RRR, no m/r/g, no peripheral edema, pulses intact, no cyanosis or clubbing.  GI:   Soft & nt; nml bowel sounds; no organomegaly or masses detected.   Musco: Warm bil, no deformities or joint swelling noted.   Neuro: alert, no focal deficits noted.    Skin: Warm, no lesions or rashes    Lab Results:  CBC    Component Value Date/Time   WBC 6.4 11/10/2021 1455   RBC 4.51 11/10/2021 1455   HGB 12.7 11/10/2021  1455   HCT 39.1 11/10/2021 1455   PLT 283.0 11/10/2021 1455   MCV 86.7 11/10/2021 1455   MCH 27.1 03/01/2016 1324   MCHC 32.4 11/10/2021 1455   RDW 15.3 11/10/2021 1455   LYMPHSABS 2.2 11/10/2021 1455   MONOABS 0.7 11/10/2021 1455   EOSABS 0.3 11/10/2021 1455   BASOSABS 0.1 11/10/2021 1455    BMET    Component Value Date/Time   NA 144 10/03/2021 0952   NA 142 07/09/2012 0000   K 4.3 10/03/2021 0952   CL 102 10/03/2021 0952   CO2 34 (H) 10/03/2021 0952   GLUCOSE 87 10/03/2021 0952   BUN 12 10/03/2021 0952   BUN 16 07/09/2012 0000   CREATININE 0.82 10/03/2021 0952   CREATININE 0.82 10/12/2012 0842   CALCIUM 9.7 10/03/2021 0952   GFRNONAA >60 03/01/2016 1324   GFRNONAA >60 10/12/2012 0842   GFRAA >60 03/01/2016 1324   GFRAA >60 10/12/2012 0842    BNP No results found for: BNP  ProBNP No results found for: PROBNP  Imaging: MM 3D SCREEN BREAST BILATERAL  Result Date: 11/02/2021 CLINICAL DATA:  Screening. EXAM: DIGITAL SCREENING BILATERAL MAMMOGRAM WITH TOMOSYNTHESIS AND CAD TECHNIQUE: Bilateral screening digital craniocaudal and mediolateral oblique mammograms were obtained. Bilateral screening digital breast tomosynthesis was performed. The images were evaluated with computer-aided detection. COMPARISON:  Previous exam(s). ACR Breast  Density Category a: The breast tissue is almost entirely fatty. FINDINGS: There are no findings suspicious for malignancy. Stable post reduction changes. IMPRESSION: No mammographic evidence of malignancy. A result letter of this screening mammogram will be mailed directly to the patient. RECOMMENDATION: Screening mammogram in one year. (Code:SM-B-01Y) BI-RADS CATEGORY  2: Benign. Electronically Signed   By: Valentino Saxon M.D.   On: 11/02/2021 16:42     PFT Results Latest Ref Rng & Units 01/20/2021  FVC-Predicted Pre % 67  FVC-Post L 2.14  FVC-Predicted Post % 71  Pre FEV1/FVC % % 77  Post FEV1/FCV % % 81  FEV1-Pre L 1.56  FEV1-Predicted Pre % 66  FEV1-Post L 1.73  DLCO uncorrected ml/min/mmHg 17.52  DLCO UNC% % 77  DLVA Predicted % 98  TLC L 4.52  TLC % Predicted % 81  RV % Predicted % 99    No results found for: NITRICOXIDE      Assessment & Plan:   Asthma Severe persistent asthma with allergic phenotype.  Patient prone to frequent exacerbations requiring antibiotics and steroids.  Long discussion today regarding asthma maintenance regimen.  I have advised her to restart Singulair and Zyrtec.  Continue on her Breo.  She has tried triple maintenance inhaler in the past without perceived benefit and difficult to tolerate. If patient continues to have exacerbations may need to look at Biologics however she is on Ocrevus for her MS. This may be contributing to some some intermittent cough and increased URIs however suspect her underlying asthma may need a more aggressive approach if unable to control flares  Plan  Patient Instructions  Continue on BREO daily , take in am .  Restart Singulair 10mg  At bedtime   Restart Zyrtec 10mg  At bedtime   Albuterol inhaler 1-2 puffs every 6hrs as needed  Asthma action plan .  Follow up with Dr Patsey Berthold in 6 weeks as planned and As needed           Eosinophilia Recent lab work shows significant eosinophilia improved and back to  baseline after course of antibiotics and steroids.  This was during an acute illness.  Patient is returned back to clinical baseline.  Continue to monitor     Rubye Oaks, NP 11/17/2021

## 2021-11-17 NOTE — Assessment & Plan Note (Signed)
Severe persistent asthma with allergic phenotype.  Patient prone to frequent exacerbations requiring antibiotics and steroids.  Long discussion today regarding asthma maintenance regimen.  I have advised her to restart Singulair and Zyrtec.  Continue on her Breo.  She has tried triple maintenance inhaler in the past without perceived benefit and difficult to tolerate. If patient continues to have exacerbations may need to look at Biologics however she is on Ocrevus for her MS. This may be contributing to some some intermittent cough and increased URIs however suspect her underlying asthma may need a more aggressive approach if unable to control flares  Plan  Patient Instructions  Continue on BREO daily , take in am .  Restart Singulair 10mg  At bedtime   Restart Zyrtec 10mg  At bedtime   Albuterol inhaler 1-2 puffs every 6hrs as needed  Asthma action plan .  Follow up with Dr in 6 weeks as planned and As needed

## 2021-11-30 NOTE — Progress Notes (Signed)
Agree with the details of the visit as noted by Tammy Parrett, NP.  C. Laura Issabella Rix, MD [facility] 

## 2021-12-28 ENCOUNTER — Ambulatory Visit: Payer: BC Managed Care – PPO | Admitting: Pulmonary Disease

## 2021-12-28 ENCOUNTER — Other Ambulatory Visit: Payer: Self-pay

## 2021-12-28 ENCOUNTER — Telehealth: Payer: Self-pay | Admitting: Pulmonary Disease

## 2021-12-28 ENCOUNTER — Encounter: Payer: Self-pay | Admitting: Pulmonary Disease

## 2021-12-28 VITALS — BP 130/72 | HR 69 | Temp 97.6°F | Ht 67.0 in | Wt 208.0 lb

## 2021-12-28 DIAGNOSIS — D721 Eosinophilia, unspecified: Secondary | ICD-10-CM | POA: Diagnosis not present

## 2021-12-28 DIAGNOSIS — G35 Multiple sclerosis: Secondary | ICD-10-CM | POA: Diagnosis not present

## 2021-12-28 DIAGNOSIS — J4551 Severe persistent asthma with (acute) exacerbation: Secondary | ICD-10-CM

## 2021-12-28 MED ORDER — PREDNISONE 10 MG (21) PO TBPK: ORAL_TABLET | ORAL | 1 refills | Status: DC

## 2021-12-28 MED ORDER — DOXYCYCLINE HYCLATE 100 MG PO TABS: 100.0000 mg | ORAL_TABLET | Freq: Two times a day (BID) | ORAL | 1 refills | Status: AC

## 2021-12-28 NOTE — Progress Notes (Signed)
Subjective:    Patient ID: Jackie White, female    DOB: 19-May-1956, 66 y.o.   MRN: [id number]  Patient Care Team: Dale Osceola, MD as PCP - General (Internal Medicine)  Chief Complaint  Patient presents with   Follow-up    Sob with exertion, prod cough with yellow sputum and wheezing.    HPI Jackie White presents today for follow-up.  He is a 66 year old lifelong never smoker who presents for follow-up on the issue of moderate to severe persistent asthma with frequent exacerbations and chronic cough.  She also has a history of eosinophilia noted previously.  She has multiple sclerosis and is on Ocrevus which unfortunately makes the patient more prone to exacerbations in the cough.  In the past we have been reluctant to start Biologics for asthma due to the Ocrevus.  She last saw our nurse practitioner Jackie White on 17 November 2021 time had noticed that she had had to require a steroid taper and antibiotic from her primary provider.  Patient has been reluctant to discontinue Ocrevus because her MS is doing well on this medication.  Over the last 3 to 4 days she has noted increasing shortness of breath, increased congestion wheezing and sputum productive of yellowish to greenish sputum she has not had any fevers, chills or sweats.  She does not endorse any other symptomatology.   Review of Systems A 10 point review of systems was performed and it is as noted above otherwise negative.  Patient Active Problem List   Diagnosis Date Noted   Asthma 11/17/2021   Eosinophilia 10/10/2021   History of colon polyps 03/10/2021   History of iron deficiency 03/10/2021   PUD (peptic ulcer disease) 03/10/2021   Aortic atherosclerosis (HCC) 01/24/2021   Skin lesion 09/19/2020   Cough variant asthma 11/19/2019   Abnormal EKG 02/21/2019   Family history of coronary artery disease 02/21/2019   Cough 01/05/2019   Shortness of breath 01/05/2019   Wheezing 07/21/2018   Impingement syndrome of left  shoulder region 05/01/2017   Osteoarthritis of knee 05/01/2017   Trochanteric bursitis of right hip 05/01/2017   Left shoulder pain 04/24/2017   Right knee pain 04/24/2017   Hemorrhoids 08/02/2016   Low back pain 05/17/2016   Right hip pain 05/17/2016   Chest tightness 03/01/2016   Rash 01/31/2015   Health care maintenance 01/31/2015   Left knee pain 10/12/2013   Pain in joint involving lower leg 10/12/2013   GERD (gastroesophageal reflux disease) 12/29/2012   Abnormal Pap smear of cervix 12/29/2012   Essential hypertension 12/25/2012   Anemia 12/25/2012   Degeneration of lumbar or lumbosacral intervertebral disc 12/25/2012   Hypercholesterolemia 12/25/2012   Vitamin D deficiency 12/25/2012   Multiple sclerosis (HCC) 12/25/2012   Social History   Tobacco Use   Smoking status: Never   Smokeless tobacco: Never  Substance Use Topics   Alcohol use: Yes    Alcohol/week: 0.0 standard drinks   No Known Allergies  Current Meds  Medication Sig   albuterol (PROVENTIL) (2.5 MG/3ML) 0.083% nebulizer solution Take 3 mLs (2.5 mg total) by nebulization every 4 (four) hours as needed for wheezing or shortness of breath.   albuterol (VENTOLIN HFA) 108 (90 Base) MCG/ACT inhaler Inhale 2 puffs into the lungs every 6 (six) hours as needed for wheezing or shortness of breath (cough).   cetirizine (ZYRTEC) 10 MG tablet Take by mouth.   ergocalciferol (VITAMIN D2) 1.25 MG (50000 UT) capsule Take 1 tablet by mouth daily.  esomeprazole (NEXIUM) 40 MG capsule TAKE 1 CAPSULE(40 MG) BY MOUTH DAILY   fluticasone furoate-vilanterol (BREO ELLIPTA) 200-25 MCG/INH AEPB Inhale 1 puff into the lungs daily.   metoprolol succinate (TOPROL-XL) 25 MG 24 hr tablet TAKE 1 TABLET BY MOUTH DAILY   montelukast (SINGULAIR) 10 MG tablet Take 1 tablet (10 mg total) by mouth daily.   Ocrelizumab (OCREVUS IV) Inject into the vein.   Oxcarbazepine (TRILEPTAL) 300 MG tablet Take 300 mg by mouth at bedtime as needed.    Oxcarbazepine (TRILEPTAL) 300 MG tablet Take by mouth.   rosuvastatin (CRESTOR) 5 MG tablet Take by mouth.   SUMAtriptan (IMITREX) 100 MG tablet Take 1 tablet by mouth as needed.   valsartan-hydrochlorothiazide (DIOVAN-HCT) 320-25 MG tablet TAKE 1 TABLET BY MOUTH DAILY   Immunization History  Administered Date(s) Administered   Influenza Split 09/26/2012, 09/24/2013   Influenza, High Dose Seasonal PF 09/08/2019   Influenza,inj,Quad PF,6+ Mos 09/14/2020, 11/10/2021   Influenza-Unspecified 08/27/2014, 09/21/2015, 09/20/2016, 09/10/2018   PFIZER(Purple Top)SARS-COV-2 Vaccination 02/10/2020, 03/02/2020, 11/16/2020, 03/18/2021   Pfizer Covid-19 Vaccine Bivalent Booster 79yrs & up 08/25/2021   Zoster Recombinat (Shingrix) 09/11/2019      Objective:   Physical Exam BP 130/72 (BP Location: Left Arm, Cuff Size: Large)   Pulse 69   Temp 97.6 F (36.4 C) (Oral)   Ht 5\' 7"  (1.702 m)   Wt 208 lb (94.3 kg)   LMP 12/27/2000   SpO2 100%   BMI 32.58 kg/m  GENERAL: Overweight woman, no acute distress.  Fully ambulatory.  No conversational dyspnea.  HEAD: Normocephalic, atraumatic.  Sounds nasally congested. EYES: Pupils equal, round, reactive to light.  No scleral icterus. MOUTH: Nose/mouth/throat not examined due to masking requirements for COVID 19. NECK: Supple. No thyromegaly. Trachea midline. No JVD.  No adenopathy. PULMONARY: Good air entry bilaterally.  Few rhonchi and end expiratory wheezes noted.   CARDIOVASCULAR: S1 and S2. Regular rate and rhythm.  No rubs, murmurs or gallops heard. ABDOMEN: Benign. MUSCULOSKELETAL: No joint deformity, no clubbing, no edema. NEUROLOGIC: No focal deficit, no gait disturbance, speech is fluent. SKIN: Intact,warm,dry.  No rashes. PSYCH: Mood and behavior normal     Assessment & Plan:     ICD-10-CM   1. Severe persistent asthma with acute exacerbation  J45.51    Prednisone taper and doxycycline Initiate process to get her enrolled in  [facility] Continue rescue albuterol as needed Follow-up 6 to 8 weeks time     2. Eosinophilia, unspecified type  D72.10    Related to her asthma diagnosis    3. Multiple sclerosis (HCC)  G35    This issue adds complexity to her management Currently on Ocrevus     Meds ordered this encounter  Medications   predniSONE (STERAPRED UNI-PAK 21 TAB) 10 MG (21) TBPK tablet    Sig: Take as directed in the package.  This is a taper pack.    Dispense:  21 tablet    Refill:  1   doxycycline (VIBRA-TABS) 100 MG tablet    Sig: Take 1 tablet (100 mg total) by mouth 2 (two) times daily for 7 days.    Dispense:  14 tablet    Refill:  1   She will be seen in follow-up in 6 to 8 weeks time she is to contact us prior to that time should any new difficulties arise.  We will initiate process to get her enrolled in [place] as this does not appear to interact with Ocrevus.  She definitely needs  better controller medication for her asthma with frequent exacerbations.   Jackie Shelter, MD [facility]    *This note was dictated using voice recognition software/Dragon.  Despite best efforts to proofread, errors can occur which can change the meaning. Any transcriptional errors that result from this process are unintentional and may not be fully corrected at the time of dictation.

## 2021-12-28 NOTE — Patient Instructions (Signed)
We have sent in a prescription for prednisone and doxycycline.  We have started the process to get you enrolled in the [place] program.  Your first visit or 2 will be at our [facility] infusion center when starting this medication but then you should be able to do self injection at home.  This is we will see him in follow-up in 6 to 8 weeks time call sooner should any new problems arise.

## 2021-12-28 NOTE — Telephone Encounter (Signed)
Tezspire enrollment form completed and faxed to pharmacy team

## 2021-12-28 NOTE — Telephone Encounter (Signed)
[name] is now FDA-approved for self-administration with pre-filled pen on 12/22/21 however unsure when it will become available to be ordered. Per OV note review, patient aware that first few injections may need to be completed at infusion center in [place].  I spoke with patient who states she will not be starting until April 2023 and is comfortable coming to [place] to receive Tezspire injections and she is comfortable with self-administration. Per [name] rep, [facility] may have Tezspire on formulary by end of March 2023. Will attempt to run authorization through [facility] through pharmacy benefits in approximately 2-3 weeks.  Will place Tezspire together form in "AWAITING RESPONSE" folder in pharmacy office  Dose: 210mg  SQ every 4 weeks Dx: J45.50 (severe persistent asthma, uncomplicated)  , PharmD, MPH, BCPS Clinical Pharmacist (Rheumatology and Pulmonology)

## 2022-02-01 ENCOUNTER — Other Ambulatory Visit: Payer: BC Managed Care – PPO

## 2022-02-03 ENCOUNTER — Other Ambulatory Visit: Payer: Self-pay | Admitting: Internal Medicine

## 2022-02-03 ENCOUNTER — Ambulatory Visit: Payer: BC Managed Care – PPO | Admitting: Internal Medicine

## 2022-02-07 NOTE — Telephone Encounter (Signed)
Submitted a Prior Authorization request to [facility] for TEZSPIRE autoinjectors via CoverMyMeds. Will update once we receive a response. ? ?Key: [id number] ? ?Knox Saliva, PharmD, MPH, BCPS ?Clinical Pharmacist (Rheumatology and Pulmonology) ? ?

## 2022-02-16 ENCOUNTER — Other Ambulatory Visit: Payer: Self-pay

## 2022-02-16 ENCOUNTER — Telehealth (INDEPENDENT_AMBULATORY_CARE_PROVIDER_SITE_OTHER): Payer: BC Managed Care – PPO | Admitting: Internal Medicine

## 2022-02-16 ENCOUNTER — Encounter: Payer: Self-pay | Admitting: Internal Medicine

## 2022-02-16 VITALS — Ht 67.0 in | Wt 207.0 lb

## 2022-02-16 DIAGNOSIS — J4521 Mild intermittent asthma with (acute) exacerbation: Secondary | ICD-10-CM | POA: Diagnosis not present

## 2022-02-16 DIAGNOSIS — U071 COVID-19: Secondary | ICD-10-CM

## 2022-02-16 MED ORDER — MOLNUPIRAVIR EUA 200MG CAPSULE: 4.0000 | ORAL_CAPSULE | Freq: Two times a day (BID) | ORAL | 0 refills | Status: AC

## 2022-02-16 NOTE — Telephone Encounter (Signed)
Patient called office and was scheduled for a virtual with Dr Shirlee Latch. ?

## 2022-02-16 NOTE — Progress Notes (Signed)
Telephone Note ? ?I connected with Jackie White ? on 02/16/22 at 11:20 AM EDT by telephone application and verified that I am speaking with the correct person using two identifiers. ? Location patient: [place] ?Location provider:work or home office ?Persons participating in the virtual visit: patient, provider ? ?I discussed the limitations and requested verbal permission for telemedicine visit. The patient expressed understanding and agreed to proceed. ? ? ?HPI: ? ?Acute telemedicine visit for : ?Tested + covid 02/15/22 with 2 rapid tests after travel to [place] with layover in [place]. H/o asthma and MS she was given Doxycycline and prednisone px by pulm prior to leave. She has prn albuterol and breo 200-25 ?She did wear a mask on the plane to [place]  ? ?C/o chills diarrhea, cough with phelgm clear to light yellow, sob worse Tuesday needed wheelchair for baggage claim, subjective fever check and 98.5 F.  ?She has had 5 pfizer vaccines ?Tried delsym, Robitussin dm, Mucinex   ? ? ?-Pertinent past medical history: see below ?-Pertinent medication allergies:No Known Allergies ?-COVID-19 vaccine status:  ?Immunization History  ?Administered Date(s) Administered  ? Influenza Split 09/26/2012, 09/24/2013  ? Influenza, High Dose Seasonal PF 09/08/2019  ? Influenza,inj,Quad PF,6+ Mos 09/14/2020, 11/10/2021  ? Influenza-Unspecified 08/27/2014, 09/21/2015, 09/20/2016, 09/10/2018  ? PFIZER(Purple Top)SARS-COV-2 Vaccination 02/10/2020, 03/02/2020, 11/16/2020, 03/18/2021  ? 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 Zoster Recombinat (Shingrix) 09/11/2019  ? ? ? ?ROS: See pertinent positives and negatives per HPI. ? ?Past Medical History:  ?Diagnosis Date  ? Abnormal Pap smear   ? Cervical disc disease   ? Cervical and lumbar disc disease  ? COVID-19   ? 02/06/22  ? GERD (gastroesophageal reflux disease)   ? History of iron deficiency   ? Hypertension   ? Multiple sclerosis (HCC)   ? Personal history of colonic  polyps   ? Reactive airway disease   ? Vitamin D deficiency   ? ? ?Past Surgical History:  ?Procedure Laterality Date  ? BREAST REDUCTION SURGERY    ? COLONOSCOPY WITH PROPOFOL N/A 03/16/2021  ? Procedure: COLONOSCOPY WITH PROPOFOL;  Surgeon: Pasty Spillers, MD;  Location: [facility];  Service: Endoscopy;  Laterality: N/A;  ? ESOPHAGOGASTRODUODENOSCOPY (EGD) WITH PROPOFOL N/A 03/16/2021  ? Procedure: ESOPHAGOGASTRODUODENOSCOPY (EGD) WITH PROPOFOL;  Surgeon: Pasty Spillers, MD;  Location: [facility];  Service: Endoscopy;  Laterality: N/A;  ? REDUCTION MAMMAPLASTY Bilateral 2002  ? TOTAL ABDOMINAL HYSTERECTOMY  2003  ? Right Oophrectomy  ? ? ? ?Current Outpatient Medications:  ?  albuterol (VENTOLIN HFA) 108 (90 Base) MCG/ACT inhaler, Inhale 2 puffs into the lungs every 6 (six) hours as needed for wheezing or shortness of breath (cough)., Disp: 18 g, Rfl: 6 ?  cetirizine (ZYRTEC) 10 MG tablet, Take by mouth., Disp: , Rfl:  ?  doxycycline (VIBRA-TABS) 100 MG tablet, Take 100 mg by mouth 2 (two) times daily., Disp: , Rfl:  ?  esomeprazole (NEXIUM) 40 MG capsule, TAKE 1 CAPSULE(40 MG) BY MOUTH DAILY, Disp: 90 capsule, Rfl: 1 ?  fluticasone furoate-vilanterol (BREO ELLIPTA) 200-25 MCG/INH AEPB, Inhale 1 puff into the lungs daily., Disp: 14 each, Rfl: 0 ?  metoprolol succinate (TOPROL-XL) 25 MG 24 hr tablet, TAKE 1 TABLET BY MOUTH DAILY, Disp: 90 tablet, Rfl: 1 ?  molnupiravir EUA (LAGEVRIO) 200 mg CAPS capsule, Take 4 capsules (800 mg total) by mouth 2 (two) times daily for 5 days. With food, Disp: 40 capsule, Rfl: 0 ?  montelukast (SINGULAIR) 10  MG tablet, Take 1 tablet (10 mg total) by mouth daily., Disp: 30 tablet, Rfl: 11 ?  Ocrelizumab (OCREVUS IV), Inject into the vein., Disp: , Rfl:  ?  predniSONE (STERAPRED UNI-PAK 21 TAB) 10 MG (21) TBPK tablet, Take as directed in the package.  This is a taper pack., Disp: 21 tablet, Rfl: 1 ?  rosuvastatin (CRESTOR) 5 MG tablet, TAKE 1 TABLET BY MOUTH EVERY  MONDAY, WEDNESDAY, FRIDAY, Disp: 39 tablet, Rfl: 2 ?  valsartan-hydrochlorothiazide (DIOVAN-HCT) 320-25 MG tablet, TAKE 1 TABLET BY MOUTH DAILY, Disp: 90 tablet, Rfl: 1 ?  albuterol (PROVENTIL) (2.5 MG/3ML) 0.083% nebulizer solution, Take 3 mLs (2.5 mg total) by nebulization every 4 (four) hours as needed for wheezing or shortness of breath. (Patient not taking: Reported on 02/16/2022), Disp: 120 mL, Rfl: 2 ?  ergocalciferol (VITAMIN D2) 1.25 MG (50000 UT) capsule, Take 1 tablet by mouth daily. (Patient not taking: Reported on 02/16/2022), Disp: , Rfl:  ?  Oxcarbazepine (TRILEPTAL) 300 MG tablet, Take 300 mg by mouth at bedtime as needed. (Patient not taking: Reported on 02/16/2022), Disp: , Rfl:  ?  Oxcarbazepine (TRILEPTAL) 300 MG tablet, Take by mouth. (Patient not taking: Reported on 02/16/2022), Disp: , Rfl:  ?  SUMAtriptan (IMITREX) 100 MG tablet, Take 1 tablet by mouth as needed. (Patient not taking: Reported on 02/16/2022), Disp: , Rfl:  ? ?EXAM: ? ?VITALS per patient if applicable: ? ?GENERAL: alert, oriented, appears well and in no acute distress ? ?LUNGS: on inspection no signs of respiratory distress, breathing rate appears normal, no obvious gross SOB, gasping or wheezing ?+cough on the phone exam  ? ?PSYCH/NEURO: pleasant and cooperative, no obvious depression or anxiety, speech and thought processing grossly intact ? ?ASSESSMENT AND PLAN: ? ?Discussed the following assessment and plan: ? ?COVID-19 - Plan: molnupiravir EUA (LAGEVRIO) 800 mg CAPS capsule mg bid  ? ?Mild intermittent asthma with exacerbation ?Taking doxycycline and prednisone already  ?Will continue breo 200-25 qd and prn albuterol, nebs  ? ?If needing prescription strength medication we will need to make an appointment with a provider.  ?These are over the counter medication options:  ?Mucinex dm green label for cough or robitussin DM  ?Multivitamin or below vitamins  ?Vitamin C 1000 mg daily.  ?Vitamin D3 4000 Iu (units) daily.  ?Zinc 100  mg daily.  ?Quercetin 250-500 mg 2 times per day   ?Elderberry  ?Oil of oregano  ?cepacol or chloroseptic spray ?Warm salt water gargles +hydrogen peroxide ?Sugar free cough drops  ?Warm tea with honey and lemon  ?Hydration  ?Try to eat though you dont feel like it   ?Tylenol or Advil  ?Nasal saline and Flonase 2 sprays nasal congestion  ?If sneezing/runny nose over the counter allergy pill claritin,allegra, zyrtec, xyzal ?Quarantine x 10-14 days 14 days preferred  ? ?Monitor pulse oximeter, buy from [facility] if oxygen is less than 90 please go to the hospital.  ?   ?   ?Are you feeling really sick? Shortness of breath, cough, chest pain?, dizziness? Confusion  ? If so let me know  ?If worsening, go to hospital or [facility] clinic Urgent care for further treatment.    ? ? ?-we discussed possible serious and likely etiologies, options for evaluation and workup, limitations of telemedicine visit vs in person visit, treatment, treatment risks and precautions. Pt is agreeable to treatment via telemedicine at this moment.  ? ?I discussed the assessment and treatment plan with the patient. The patient was provided an opportunity  to ask questions and all were answered. The patient agreed with the plan and demonstrated an understanding of the instructions. ?  ? ?Time spent 20 minutes ?Pasty Spillers McLean-Scocuzza, MD  ? ?

## 2022-02-16 NOTE — Telephone Encounter (Signed)
Noted  

## 2022-02-16 NOTE — Patient Instructions (Signed)

## 2022-02-17 ENCOUNTER — Other Ambulatory Visit: Payer: Self-pay | Admitting: Internal Medicine

## 2022-02-20 ENCOUNTER — Telehealth: Payer: Self-pay | Admitting: Pulmonary Disease

## 2022-02-20 NOTE — Telephone Encounter (Signed)
Patient tested positive for covid 02/16/2022. She is aware that visit can be virtual. ?PCP started antiviral. She stated sx have improved since.  ?She is aware to continue doxy and hold prednisone per Dr. Jayme Cloud verbally.  ?Nothing further needed.  ? ? ?

## 2022-02-20 NOTE — Telephone Encounter (Signed)
Okay to do virtual, patient currently with COVID. ?

## 2022-02-20 NOTE — Telephone Encounter (Signed)
Dr. Jayme Cloud, please advise if okay for 02/22/2022 visit to be virtual? Thanks ?

## 2022-02-22 ENCOUNTER — Telehealth (INDEPENDENT_AMBULATORY_CARE_PROVIDER_SITE_OTHER): Payer: BC Managed Care – PPO | Admitting: Pulmonary Disease

## 2022-02-22 DIAGNOSIS — G35 Multiple sclerosis: Secondary | ICD-10-CM | POA: Diagnosis not present

## 2022-02-22 DIAGNOSIS — J4550 Severe persistent asthma, uncomplicated: Secondary | ICD-10-CM | POA: Diagnosis not present

## 2022-02-22 DIAGNOSIS — U071 COVID-19: Secondary | ICD-10-CM

## 2022-02-22 DIAGNOSIS — D721 Eosinophilia, unspecified: Secondary | ICD-10-CM | POA: Diagnosis not present

## 2022-02-22 NOTE — Progress Notes (Signed)
Subjective:    Patient ID: Jackie White, female    DOB: [birth year], 66 y.o.   MRN: [id number] Patient Care Team: Dale Bankston, MD as PCP - General (Internal Medicine)  Chief Complaint  Patient presents with   Follow-up        ~~~~~~~~~~~~~~~~~~~~~~~~~~~~~~~~~~~~~~~~~~~~~~~~~~~~~~~~~ Virtual Visit Via Video Note:   This visit type was conducted due to national recommendations for restrictions regarding the COVID-19 pandemic. This format is felt to be most appropriate for this patient at this time.  All issues noted in this document were discussed and addressed.  No physical exam was performed (except for noted visual exam findings with Video Visits).    I connected with Jackie White via video at 9:00 hrs. and verified that I was speaking with the correct person using two identifiers. Location patient: home Location provider: [facility] Persons participating in the virtual visit: patient, physician   I discussed the limitations, risks, security and privacy concerns of performing an evaluation and management service by telephone and the availability of in person appointments. The patient expressed understanding and agreed to proceed. ~~~~~~~~~~~~~~~~~~~~~~~~~~~~~~~~~~~~~~~~~~~~~~~~~~~~~~~~~ HPI  Jackie White is a 66 year old lifelong never smoker who follows here for the issue of severe asthma with frequent exacerbations.  We last saw her on 28 December 2021.  This is a follow-up scheduled visit.  The visit had to be changed to video visit due to the patient currently having COVID-19.  She is currently on molnupiravir for that.  She states that despite the infection she is actually doing well and not with increased shortness of breath.  She is compliant with her Earlie Server and as needed albuterol.  She had requested a delay on starting Tezspire due to a trip.  She has now returned from the trip and feels that she can going ahead with the medication.  She does not  endorse any other symptomatology.  With COVID-19 she has had nasal congestion mostly some initial chest tightness but once she started taking molnupiravir she has been doing well.  She does not endorse any other symptomatology.  Still running a low-grade fever.   Review of Systems A 10 point review of systems was performed and it is as noted above otherwise negative.  Patient Active Problem List   Diagnosis Date Noted   History of COVID-19 03/09/2022   Asthma 11/17/2021   Eosinophilia 10/10/2021   History of colon polyps 03/10/2021   History of iron deficiency 03/10/2021   PUD (peptic ulcer disease) 03/10/2021   Aortic atherosclerosis (HCC) 01/24/2021   Skin lesion 09/19/2020   Cough variant asthma 11/19/2019   Abnormal EKG 02/21/2019   Family history of coronary artery disease 02/21/2019   Cough 01/05/2019   Shortness of breath 01/05/2019   Wheezing 07/21/2018   Impingement syndrome of left shoulder region 05/01/2017   Osteoarthritis of knee 05/01/2017   Trochanteric bursitis of right hip 05/01/2017   Left shoulder pain 04/24/2017   Right knee pain 04/24/2017   Hemorrhoids 08/02/2016   Low back pain 05/17/2016   Right hip pain 05/17/2016   Chest tightness 03/01/2016   Rash 01/31/2015   Health care maintenance 01/31/2015   Left knee pain 10/12/2013   Pain in joint involving lower leg 10/12/2013   GERD (gastroesophageal reflux disease) 12/29/2012   Abnormal Pap smear of cervix 12/29/2012   Essential hypertension 12/25/2012   Anemia 12/25/2012   Degeneration of lumbar or lumbosacral intervertebral disc 12/25/2012   Hypercholesterolemia 12/25/2012   Vitamin D deficiency 12/25/2012  Multiple sclerosis (HCC) 12/25/2012   Social History   Tobacco Use   Smoking status: Never   Smokeless tobacco: Never  Substance Use Topics   Alcohol use: Yes    Alcohol/week: 0.0 standard drinks   No Known Allergies  Medications were reviewed with the patient.    Objective:    Physical Exam  Due to the nature of the visit, no physical exam was performed.  Patient did not exhibit conversational dyspnea during the visit.     Assessment & Plan:     ICD-10-CM   1. Severe persistent asthma without complication  J45.50    Continue process to obtain Tezspire for the patient Continue Breo 200/25, 1 inhalation daily Continue as needed albuterol Continue Singulair    2. Eosinophilia, unspecified type  D72.10    Related to her asthma diagnosis    3. Multiple sclerosis (HCC)  G35    On Ocrevus This issue adds complexity to her management    4. COVID-19 virus infection  U07.1    Continue molnupiravir     We will see the patient in follow-up in 3 months time she is to contact us prior to that time should any new difficulties arise.  Gailen Shelter, MD [facility]    *This note was dictated using voice recognition software/Dragon.  Despite best efforts to proofread, errors can occur which can change the meaning. Any transcriptional errors that result from this process are unintentional and may not be fully corrected at the time of dictation.

## 2022-03-02 ENCOUNTER — Telehealth: Payer: Self-pay | Admitting: *Deleted

## 2022-03-02 DIAGNOSIS — E7800 Pure hypercholesterolemia, unspecified: Secondary | ICD-10-CM

## 2022-03-02 DIAGNOSIS — I10 Essential (primary) hypertension: Secondary | ICD-10-CM

## 2022-03-02 NOTE — Telephone Encounter (Signed)
Please place future orders for lab appt.  

## 2022-03-03 NOTE — Telephone Encounter (Signed)
Orders placed for labs

## 2022-03-06 ENCOUNTER — Other Ambulatory Visit (INDEPENDENT_AMBULATORY_CARE_PROVIDER_SITE_OTHER): Payer: BC Managed Care – PPO

## 2022-03-06 DIAGNOSIS — I10 Essential (primary) hypertension: Secondary | ICD-10-CM

## 2022-03-06 DIAGNOSIS — E7800 Pure hypercholesterolemia, unspecified: Secondary | ICD-10-CM | POA: Diagnosis not present

## 2022-03-06 LAB — BASIC METABOLIC PANEL
BUN: 9 mg/dL (ref 6–23)
CO2: 33 mEq/L — ABNORMAL HIGH (ref 19–32)
Calcium: 9.1 mg/dL (ref 8.4–10.5)
Chloride: 104 mEq/L (ref 96–112)
Creatinine, Ser: 0.81 mg/dL (ref 0.40–1.20)
GFR: 77.03 mL/min (ref >60.00)
Glucose, Bld: 91 mg/dL (ref 70–99)
Potassium: 4 mEq/L (ref 3.5–5.1)
Sodium: 142 mEq/L (ref 135–145)

## 2022-03-06 LAB — HEPATIC FUNCTION PANEL
ALT: 15 U/L (ref 0–35)
AST: 14 U/L (ref 0–37)
Albumin: 4 g/dL (ref 3.5–5.2)
Alkaline Phosphatase: 82 U/L (ref 39–117)
Bilirubin, Direct: 0.1 mg/dL (ref 0.0–0.3)
Total Bilirubin: 0.7 mg/dL (ref 0.2–1.2)
Total Protein: 6.1 g/dL (ref 6.0–8.3)

## 2022-03-06 LAB — LIPID PANEL
Cholesterol: 178 mg/dL (ref 0–200)
HDL: 72.7 mg/dL (ref >39.00)
LDL Cholesterol: 87 mg/dL (ref 0–99)
NonHDL: 104.88
Total CHOL/HDL Ratio: 2
Triglycerides: 87 mg/dL (ref 0.0–149.0)
VLDL: 17.4 mg/dL (ref 0.0–40.0)

## 2022-03-09 ENCOUNTER — Ambulatory Visit: Payer: BC Managed Care – PPO | Admitting: Internal Medicine

## 2022-03-09 ENCOUNTER — Ambulatory Visit (INDEPENDENT_AMBULATORY_CARE_PROVIDER_SITE_OTHER): Payer: BC Managed Care – PPO

## 2022-03-09 ENCOUNTER — Encounter: Payer: Self-pay | Admitting: Internal Medicine

## 2022-03-09 VITALS — BP 134/70 | HR 57 | Temp 98.2°F | Resp 12 | Ht 67.0 in | Wt 207.6 lb

## 2022-03-09 DIAGNOSIS — I10 Essential (primary) hypertension: Secondary | ICD-10-CM

## 2022-03-09 DIAGNOSIS — Z0000 Encounter for general adult medical examination without abnormal findings: Secondary | ICD-10-CM

## 2022-03-09 DIAGNOSIS — M25512 Pain in left shoulder: Secondary | ICD-10-CM

## 2022-03-09 DIAGNOSIS — D649 Anemia, unspecified: Secondary | ICD-10-CM

## 2022-03-09 DIAGNOSIS — Z8616 Personal history of COVID-19: Secondary | ICD-10-CM

## 2022-03-09 DIAGNOSIS — R059 Cough, unspecified: Secondary | ICD-10-CM

## 2022-03-09 DIAGNOSIS — E7800 Pure hypercholesterolemia, unspecified: Secondary | ICD-10-CM | POA: Diagnosis not present

## 2022-03-09 DIAGNOSIS — G35 Multiple sclerosis: Secondary | ICD-10-CM

## 2022-03-09 DIAGNOSIS — I700 Atherosclerosis of aorta: Secondary | ICD-10-CM

## 2022-03-09 DIAGNOSIS — K219 Gastro-esophageal reflux disease without esophagitis: Secondary | ICD-10-CM

## 2022-03-09 DIAGNOSIS — E876 Hypokalemia: Secondary | ICD-10-CM

## 2022-03-09 DIAGNOSIS — J4550 Severe persistent asthma, uncomplicated: Secondary | ICD-10-CM

## 2022-03-09 NOTE — Assessment & Plan Note (Addendum)
Physical today 03/09/22.  PAP 05/12/20 - negative with negative HPV.  Mammogram 11/02/21 - Briads I. Colonoscopy 03/16/21 -Hemorrhoids found on perianal exam. Diverticulosis in the sigmoid colon. The examination was otherwise normal.  ?

## 2022-03-09 NOTE — Progress Notes (Signed)
Patient ID: Jackie White, female   DOB: June 21, 1956, 66 y.o.   MRN: [id number] ? ? ?Subjective:  ? ? Patient ID: Jackie White, female    DOB: 02-13-1956, 66 y.o.   MRN: [id number] ? ?This visit occurred during the SARS-CoV-2 public health emergency.  Safety protocols were in place, including screening questions prior to the visit, additional usage of staff PPE, and extensive cleaning of exam room while observing appropriate contact time as indicated for disinfecting solutions.  ? ?Patient here for her physical exam.   ? ?Chief Complaint  ?Patient presents with  ? Annual Exam  ?  CPE  ? .  ? ?HPI ?Tested positive for covid 02/15/22 after a trip to [place].  Had doxycycline and prednisone to take - on the trip.  Breo. Saw Dr Shirlee Latch. Note reviewed.  She is better.  Off mucinex now.  Some congestion.  Took zyrtec last night.  Started back on singulair.  No increased sob.  No chest pain.  Is having increased left shoulder pain.  No known injury.  No acid reflux.  No abdominal apin.  Bowels moving.   ? ? ?Past Medical History:  ?Diagnosis Date  ? Abnormal Pap smear   ? Cervical disc disease   ? Cervical and lumbar disc disease  ? COVID-19   ? 02/15/22  ? GERD (gastroesophageal reflux disease)   ? History of iron deficiency   ? Hypertension   ? Multiple sclerosis (HCC)   ? Personal history of colonic polyps   ? Reactive airway disease   ? Vitamin D deficiency   ? ?Past Surgical History:  ?Procedure Laterality Date  ? BREAST REDUCTION SURGERY    ? COLONOSCOPY WITH PROPOFOL N/A 03/16/2021  ? Procedure: COLONOSCOPY WITH PROPOFOL;  Surgeon: Pasty Spillers, MD;  Location: [facility] END[facility];  Service: Endoscopy;  Laterality: N/A;  ? ESOPHAGOGASTRODUODENOSCOPY (EGD) WITH PROPOFOL N/A 03/16/2021  ? Procedure: ESOPHAGOGASTRODUODENOSCOPY (EGD) WITH PROPOFOL;  Surgeon: Pasty Spillers, MD;  Location: [facility] END[facility];  Service: Endoscopy;  Laterality: N/A;  ? REDUCTION MAMMAPLASTY Bilateral 2002  ? TOTAL ABDOMINAL HYSTERECTOMY   2003  ? Right Oophrectomy  ? ?Family History  ?Problem Relation Age of Onset  ? Stroke Mother   ? Hypertension Mother   ? Heart disease Maternal Grandmother   ? Breast cancer Neg Hx   ? ?Social History  ? ?Socioeconomic History  ? Marital status: Married  ?  Spouse name: Not on file  ? Number of children: Not on file  ? Years of education: Not on file  ? Highest education level: Not on file  ?Occupational History  ? Not on file  ?Tobacco Use  ? Smoking status: Never  ? Smokeless tobacco: Never  ?Vaping Use  ? Vaping Use: Never used  ?Substance and Sexual Activity  ? Alcohol use: Yes  ?  Alcohol/week: 0.0 standard drinks  ? Drug use: No  ? Sexual activity: Not on file  ?Other Topics Concern  ? Not on file  ?Social History Narrative  ? Not on file  ? ?Social Determinants of Health  ? ?Financial Resource Strain: Not on file  ?Food Insecurity: Not on file  ?Transportation Needs: Not on file  ?Physical Activity: Not on file  ?Stress: Not on file  ?Social Connections: Not on file  ? ? ? ?Review of Systems  ?Constitutional:  Negative for appetite change and unexpected weight change.  ?HENT:  Positive for congestion. Negative for sinus pressure.   ?Respiratory:  Negative for  chest tightness and shortness of breath.   ?     Some cough/congestion as outlined.   ?Cardiovascular:  Negative for chest pain, palpitations and leg swelling.  ?Gastrointestinal:  Negative for abdominal pain, diarrhea, nausea and vomiting.  ?Genitourinary:  Negative for difficulty urinating and dysuria.  ?Musculoskeletal:  Negative for joint swelling and myalgias.  ?Skin:  Negative for color change and rash.  ?Neurological:  Negative for dizziness, light-headedness and headaches.  ?Psychiatric/Behavioral:  Negative for agitation and dysphoric mood.   ? ?   ?Objective:  ?  ? ?BP 134/70 (BP Location: Left Arm, Patient Position: Sitting, Cuff Size: Small)   Pulse (!) 57   Temp 98.2 ?F (36.8 ?C) (Temporal)   Resp 12   Ht 5\' 7"  (1.702 m)   Wt 207 lb  9.6 oz (94.2 kg)   LMP 12/27/2000   SpO2 99%   BMI 32.51 kg/m?  ?Wt Readings from Last 3 Encounters:  ?03/09/22 207 lb 9.6 oz (94.2 kg)  ?02/16/22 207 lb (93.9 kg)  ?12/28/21 208 lb (94.3 kg)  ? ? ?Physical Exam ?Vitals reviewed.  ?Constitutional:   ?   General: She is not in acute distress. ?   Appearance: Normal appearance. She is well-developed.  ?HENT:  ?   Head: Normocephalic and atraumatic.  ?   Right Ear: External ear normal.  ?   Left Ear: External ear normal.  ?Eyes:  ?   General: No scleral icterus.    ?   Right eye: No discharge.     ?   Left eye: No discharge.  ?   Conjunctiva/sclera: Conjunctivae normal.  ?Neck:  ?   Thyroid: No thyromegaly.  ?Cardiovascular:  ?   Rate and Rhythm: Normal rate and regular rhythm.  ?Pulmonary:  ?   Effort: No tachypnea, accessory muscle usage or respiratory distress.  ?   Breath sounds: Normal breath sounds. No decreased breath sounds or wheezing.  ?Chest:  ?Breasts: ?   Right: No inverted nipple, mass, nipple discharge or tenderness (no axillary adenopathy).  ?   Left: No inverted nipple, mass, nipple discharge or tenderness (no axilarry adenopathy).  ?Abdominal:  ?   General: Bowel sounds are normal.  ?   Palpations: Abdomen is soft.  ?   Tenderness: There is no abdominal tenderness.  ?Musculoskeletal:     ?   General: No swelling or tenderness.  ?   Cervical back: Neck supple.  ?Lymphadenopathy:  ?   Cervical: No cervical adenopathy.  ?Skin: ?   Findings: No erythema or rash.  ?Neurological:  ?   Mental Status: She is alert and oriented to person, place, and time.  ?Psychiatric:     ?   Mood and Affect: Mood normal.     ?   Behavior: Behavior normal.  ? ? ? ?Outpatient Encounter Medications as of 03/09/2022  ?Medication Sig  ? albuterol (VENTOLIN HFA) 108 (90 Base) MCG/ACT inhaler Inhale 2 puffs into the lungs every 6 (six) hours as needed for wheezing or shortness of breath (cough).  ? cetirizine (ZYRTEC) 10 MG tablet Take by mouth.  ? esomeprazole (NEXIUM) 40 MG  capsule TAKE 1 CAPSULE(40 MG) BY MOUTH DAILY  ? fluticasone furoate-vilanterol (BREO ELLIPTA) 200-25 MCG/INH AEPB Inhale 1 puff into the lungs daily.  ? metoprolol succinate (TOPROL-XL) 25 MG 24 hr tablet TAKE 1 TABLET BY MOUTH DAILY  ? montelukast (SINGULAIR) 10 MG tablet Take 1 tablet (10 mg total) by mouth daily.  ? Ocrelizumab (OCREVUS IV) Inject  into the vein.  ? rosuvastatin (CRESTOR) 5 MG tablet TAKE 1 TABLET BY MOUTH EVERY MONDAY, WEDNESDAY, FRIDAY  ? valsartan-hydrochlorothiazide (DIOVAN-HCT) 320-25 MG tablet TAKE 1 TABLET BY MOUTH DAILY  ? [DISCONTINUED] albuterol (PROVENTIL) (2.5 MG/3ML) 0.083% nebulizer solution Take 3 mLs (2.5 mg total) by nebulization every 4 (four) hours as needed for wheezing or shortness of breath. (Patient not taking: Reported on 02/16/2022)  ? [DISCONTINUED] doxycycline (VIBRA-TABS) 100 MG tablet Take 100 mg by mouth 2 (two) times daily.  ? [DISCONTINUED] ergocalciferol (VITAMIN D2) 1.25 MG (50000 UT) capsule Take 1 tablet by mouth daily. (Patient not taking: Reported on 02/16/2022)  ? [DISCONTINUED] Oxcarbazepine (TRILEPTAL) 300 MG tablet Take 300 mg by mouth at bedtime as needed. (Patient not taking: Reported on 02/16/2022)  ? [DISCONTINUED] Oxcarbazepine (TRILEPTAL) 300 MG tablet Take by mouth. (Patient not taking: Reported on 02/16/2022)  ? [DISCONTINUED] predniSONE (STERAPRED UNI-PAK 21 TAB) 10 MG (21) TBPK tablet Take as directed in the package.  This is a taper pack.  ? [DISCONTINUED] SUMAtriptan (IMITREX) 100 MG tablet Take 1 tablet by mouth as needed. (Patient not taking: Reported on 02/16/2022)  ? ?No facility-administered encounter medications on file as of 03/09/2022.  ?  ? ?Lab Results  ?Component Value Date  ? WBC 6.4 11/10/2021  ? HGB 12.7 11/10/2021  ? HCT 39.1 11/10/2021  ? PLT 283.0 11/10/2021  ? GLUCOSE 91 03/06/2022  ? CHOL 178 03/06/2022  ? TRIG 87.0 03/06/2022  ? HDL 72.70 03/06/2022  ? LDLCALC 87 03/06/2022  ? ALT 15 03/06/2022  ? AST 14 03/06/2022  ? NA 142  03/06/2022  ? K 4.0 03/06/2022  ? CL 104 03/06/2022  ? CREATININE 0.81 03/06/2022  ? BUN 9 03/06/2022  ? CO2 33 (H) 03/06/2022  ? TSH 2.22 10/03/2021  ? ? ?MM 3D SCREEN BREAST BILATERAL ? ?Result Date: 11/02/2021 ?CLINI

## 2022-03-13 ENCOUNTER — Encounter: Payer: Self-pay | Admitting: Internal Medicine

## 2022-03-13 NOTE — Assessment & Plan Note (Signed)
Is followed by pulmonary.  Recent covid infection.  Also took doxycycline and prednisone.  She is better.  Given persistent cough and recent covid infection, will check cxr to confirm no pneumonia or residual changes from covid, etc.  ?

## 2022-03-13 NOTE — Assessment & Plan Note (Signed)
Receiving ocrevus.  Followed by neurology.  Has done well on this medication.  

## 2022-03-13 NOTE — Assessment & Plan Note (Signed)
Follow cbc.  

## 2022-03-13 NOTE — Assessment & Plan Note (Signed)
Blood pressure has been doing well.  Continue metoprolol and diovan/hctz.  Follow pressures.  Follow metabolic panel.  

## 2022-03-13 NOTE — Assessment & Plan Note (Signed)
Increased and persistent left shoulder pain.  Some limited rom.  Check xray.  ?

## 2022-03-13 NOTE — Assessment & Plan Note (Signed)
No acid reflux reported.  On nexium.  

## 2022-03-13 NOTE — Assessment & Plan Note (Signed)
On crestor.  Low cholesterol diet and exercise.  Follow lipid panel and liver function tests.   

## 2022-03-13 NOTE — Assessment & Plan Note (Signed)
Continue crestor 

## 2022-03-13 NOTE — Assessment & Plan Note (Signed)
Doing better.  Check cxr as outlined given persistent cough.  Just started back on zyrtec and singulair.  ?

## 2022-03-13 NOTE — Assessment & Plan Note (Signed)
Recent covid infection.  She is doing better.  Continue breo.  Just restarted zyrtec and singulair.  Check cxr given persistent symptoms.  Confirm no pneumonia.  Follow.   ?

## 2022-03-20 ENCOUNTER — Other Ambulatory Visit: Payer: Self-pay | Admitting: Pulmonary Disease

## 2022-03-20 DIAGNOSIS — J45991 Cough variant asthma: Secondary | ICD-10-CM

## 2022-03-30 ENCOUNTER — Telehealth: Payer: Self-pay | Admitting: Pharmacist

## 2022-03-30 NOTE — Telephone Encounter (Signed)
Submitted a Prior Authorization request to [facility] for TEZSPIRE autoinjector via CoverMyMeds. Will update once we receive a response. ? ?Key: [id number] ? ?Once approved, patient is eligible for Tezspire copay card ? ?Chesley Mires, PharmD, MPH, BCPS, CPP ?Clinical Pharmacist (Rheumatology and Pulmonology) ?

## 2022-03-30 NOTE — Telephone Encounter (Signed)
Will close encounter and start new Tezspire autoinjector BIV ? ?Chesley Mires, PharmD, MPH, BCPS, CPP ?Clinical Pharmacist (Rheumatology and Pulmonology) ?

## 2022-03-30 NOTE — Telephone Encounter (Signed)
Pharmacy team, is there an update on this PA . Can encounter be closed? ?

## 2022-04-19 ENCOUNTER — Other Ambulatory Visit (HOSPITAL_COMMUNITY): Payer: Self-pay

## 2022-04-19 NOTE — Telephone Encounter (Signed)
Called [facility] to check status of pending PA- they do not see where one was submitted. Rep provided a new CMM key to submit electronically- did not work. Rep will fax clinical questions.

## 2022-04-21 NOTE — Telephone Encounter (Signed)
Clinical prior auth form for Tezspire prepped. Will submit once chart note is signed. Routing to Dr. Patsey Berthold for this  Knox Saliva, PharmD, MPH, BCPS, CPP Clinical Pharmacist (Rheumatology and Pulmonology)

## 2022-04-24 ENCOUNTER — Encounter: Payer: Self-pay | Admitting: Pulmonary Disease

## 2022-04-24 NOTE — Patient Instructions (Addendum)
F/U 3 mos  Tezspire

## 2022-04-25 ENCOUNTER — Other Ambulatory Visit (HOSPITAL_COMMUNITY): Payer: Self-pay

## 2022-04-25 NOTE — Telephone Encounter (Signed)
Clinical prior authorization form for Tezspire faxed to [facility]  Phone: [phone] Fax: [phone] PA Case ID: [id number]  Chesley Mires, PharmD, MPH, BCPS, CPP Clinical Pharmacist (Rheumatology and Pulmonology)

## 2022-04-27 ENCOUNTER — Other Ambulatory Visit (HOSPITAL_COMMUNITY): Payer: Self-pay

## 2022-04-27 NOTE — Telephone Encounter (Signed)
Received notification from [facility] regarding a prior authorization for [place]. Authorization has been APPROVED from 04/25/2022 to 10/25/2022. Approval letter sent to scan center.  Patient must fill through [facility]: [phone]  Authorization # [id number]

## 2022-05-01 NOTE — Telephone Encounter (Signed)
Attempted to enroll patient into Tezspire copay card but unable to do so. Patient advised to complete enroll through email she received and reach back out once she has billing information so we can have her scheduled for Tezspire new start visit.  Chesley Mires, PharmD, MPH, BCPS, CPP Clinical Pharmacist (Rheumatology and Pulmonology)

## 2022-05-04 NOTE — Telephone Encounter (Signed)
ATC patient regarding Tezspire copay card. LVM asking patient to return call with copay card information.    Valeda Malm, Pharm.D. PGY-1 Pharmacy Resident 05/04/2022 4:12 PM

## 2022-05-08 NOTE — Telephone Encounter (Signed)
Patient did receive Tezspire copay card and will bring copay information to her new start appointment on 05/17/22 at 1:20.  Valeda Malm, Pharm.D. PGY-1 Pharmacy Resident 05/08/2022 2:06 PM

## 2022-05-16 NOTE — Progress Notes (Signed)
HPI Patient presents today to [facility] to see pharmacy team for [facility] new start.  Past medical history includes severe asthma, multiple sclerosis, GERD, PUD. She is starting Tezspire vs other asthma biologics due to minimal immunosuppressive effect as a confounding factor while also on q6 months Ocrevus  Number of hospitalizations in past year: none Number of asthma exacerbations in past year: 7  Respiratory Medications Current regimen: Breo Ellipta 1 puff daily , albuterol inhaler as needed, Singulair 10 mg daily Tried in past: none Patient reports no known adherence challenges  OBJECTIVE No Known Allergies  Outpatient Encounter Medications as of 05/17/2022  Medication Sig   albuterol (VENTOLIN HFA) 108 (90 Base) MCG/ACT inhaler Inhale 2 puffs into the lungs every 6 (six) hours as needed for wheezing or shortness of breath (cough).   BREO ELLIPTA 200-25 MCG/ACT AEPB INHALE 1 PUFF INTO THE LUNGS DAILY   cetirizine (ZYRTEC) 10 MG tablet Take by mouth.   esomeprazole (NEXIUM) 40 MG capsule TAKE 1 CAPSULE(40 MG) BY MOUTH DAILY   metoprolol succinate (TOPROL-XL) 25 MG 24 hr tablet TAKE 1 TABLET BY MOUTH DAILY   montelukast (SINGULAIR) 10 MG tablet Take 1 tablet (10 mg total) by mouth daily.   Ocrelizumab (OCREVUS IV) Inject into the vein.   rosuvastatin (CRESTOR) 5 MG tablet TAKE 1 TABLET BY MOUTH EVERY MONDAY, WEDNESDAY, FRIDAY   valsartan-hydrochlorothiazide (DIOVAN-HCT) 320-25 MG tablet TAKE 1 TABLET BY MOUTH DAILY   No facility-administered encounter medications on file as of 05/17/2022.     Immunization History  Administered Date(s) Administered   Influenza Split 09/26/2012, 09/24/2013   Influenza, High Dose Seasonal PF 09/08/2019   Influenza,inj,Quad PF,6+ Mos 09/14/2020, 11/10/2021   Influenza-Unspecified 08/27/2014, 09/21/2015, 09/20/2016, 09/10/2018   PFIZER(Purple Top)SARS-COV-2 Vaccination 02/10/2020, 03/02/2020, 11/16/2020, 03/18/2021   Pfizer Covid-19  Vaccine Bivalent Booster 16yrs & up 08/25/2021   Zoster Recombinat (Shingrix) 09/11/2019     PFTs    Latest Ref Rng & Units 01/20/2021    3:18 PM  PFT Results  FVC-Predicted Pre % 67  P  FVC-Post L 2.14  P  FVC-Predicted Post % 71  P  Pre FEV1/FVC % % 77  P  Post FEV1/FCV % % 81  P  FEV1-Pre L 1.56  P  FEV1-Predicted Pre % 66  P  FEV1-Post L 1.73  P  DLCO uncorrected ml/min/mmHg 17.52  P  DLCO UNC% % 77  P  DLVA Predicted % 98  P  TLC L 4.52  P  TLC % Predicted % 81  P  RV % Predicted % 99  P    P Preliminary result     Eosinophils Most recent blood eosinophil count was 0.3 cells/microL taken on 11/10/21.   IgE: 14 on 07/29/21   Assessment   Biologics training for tezepulumab [name])  Reviewed that other asthma biologics are only minimally immunosuppressive and if [name] is not effective for her asthma, Harrington Challenger and Nucala may be possible options.  Goals of therapy: Mechanism: human monoclonal IgG2? antibody that binds to TSLP. This blocks TSLP from its effect on inflammation including reduce eosinophils, IgE, FeNO, IL-5, and IL-13. Mechanism is not definitively established. Reviewed that [name] is add-on medication and patient must continue maintenance inhaler regimen. Response to therapy: may take 3-4 months to determine efficacy.  Side effects: injection site reaction (6-18%), antibody development (2%), arthralgia (4%), back pain (4%), pharyngitis (4%)  Dose: Tezspire 210 mg once every 4 weeks  Administration/Storage:  Reviewed administration sites of thigh or abdomen (  at least 2-3 inches away from abdomen). Reviewed the upper arm is only appropriate if caregiver is administering injection  Do not shake pen/syringe as this could lead to product foaming or precipitation. Do not shake syringe as this could lead to product foaming or precipitation.  Access: Approval of Tezspire through: insurance Patient enrolled into copay card program to help with copay  assistance.  Patient self-administered Tezspire 210mg /1.91 ml in stomach using sample Tezspire 210mg /1.91 ml Autoinjector pen NDC: Lot: A Expiration: 06/19/2024  Patient monitored for 30 minutes for adverse reaction.  Patient tolerated well. Injection site checked and no reaction noted.  Medication Reconciliation  A drug regimen assessment was performed, including review of allergies, interactions, disease-state management, dosing and immunization history. Medications were reviewed with the patient, including name, instructions, indication, goals of therapy, potential side effects, importance of adherence, and safe use.  Drug interaction(s): none noted  PLAN Continue Tezspire 210mg  SQ every 28 days.  [facility]: [phone].  Patient's significant other will administer future doses of Tezspire moving forward. Continue maintenance asthma regimen of: Breo Ellipta 1 puff daily, albuterol inhaler as needed, Singulair 10 mg daily  All questions encouraged and answered.  Instructed patient to reach out with any further questions or concerns.  Thank you for allowing pharmacy to participate in this patient's care.  This appointment required 60 minutes of patient care (this includes precharting, chart review, review of results, face-to-face care, etc.).  06/21/2024, PharmD, MPH, BCPS, CPP Clinical Pharmacist (Rheumatology and Pulmonology)

## 2022-05-17 ENCOUNTER — Ambulatory Visit: Payer: BC Managed Care – PPO | Admitting: Pharmacist

## 2022-05-17 DIAGNOSIS — Z7189 Other specified counseling: Secondary | ICD-10-CM

## 2022-05-17 DIAGNOSIS — J4550 Severe persistent asthma, uncomplicated: Secondary | ICD-10-CM

## 2022-05-17 MED ORDER — TEZSPIRE 210 MG/1.91ML ~~LOC~~ SOAJ: 210.0000 mg | SUBCUTANEOUS | 5 refills | Status: DC

## 2022-05-17 NOTE — Telephone Encounter (Signed)
Tezspire copay card billing information:  ID: [id number] BIN: [id number] PCN: [id number] Group Number: [id number]  Chesley Mires, PharmD, MPH, BCPS, CPP Clinical Pharmacist (Rheumatology and Pulmonology)

## 2022-05-17 NOTE — Patient Instructions (Signed)
Your next Tezspire dose is due on 7/26, 8/23, and every 28 days thereafter  CONTINUE Breo Ellipta one puff daily, albuterol inhaler as needed, Singulair 10 mg daily  Your prescription [facility] shipped from [facility]. Their phone number is [phone] Please call to schedule shipment and confirm address. They will mail your medication to your home.  Your copay should be affordable. If you call the pharmacy and it is not affordable, please double-check that they are billing through your copay card as secondary coverage. That copay card information is: ID: [id number] BIN: [id number] PCN: [id number] Group Number: [id number]  You will need to be seen by your provider in 3 to 4 months to assess how [name] is working for you. Please ensure you have a follow-up appointment scheduled in September-October. Call our clinic if you need to make this appointment.  How to manage an injection site reaction: Remember the 5 C's: COUNTER - leave on the counter at least 30 minutes but up to overnight to bring medication to room temperature. This may help prevent stinging COLD - place something cold (like an ice gel pack or cold water bottle) on the injection site just before cleansing with alcohol. This may help reduce pain CLARITIN - use Claritin (generic name is loratadine) for the first two weeks of treatment or the day of, the day before, and the day after injecting. This will help to minimize injection site reactions CORTISONE CREAM - apply if injection site is irritated and itching CALL ME - if injection site reaction is bigger than the size of your fist, looks infected, blisters, or if you develop hives

## 2022-06-11 ENCOUNTER — Other Ambulatory Visit: Payer: Self-pay | Admitting: Pulmonary Disease

## 2022-06-11 DIAGNOSIS — J45991 Cough variant asthma: Secondary | ICD-10-CM

## 2022-07-11 ENCOUNTER — Encounter: Payer: Self-pay | Admitting: Internal Medicine

## 2022-07-11 ENCOUNTER — Ambulatory Visit: Payer: BC Managed Care – PPO | Admitting: Internal Medicine

## 2022-07-11 DIAGNOSIS — D649 Anemia, unspecified: Secondary | ICD-10-CM

## 2022-07-11 DIAGNOSIS — I700 Atherosclerosis of aorta: Secondary | ICD-10-CM

## 2022-07-11 DIAGNOSIS — G35 Multiple sclerosis: Secondary | ICD-10-CM

## 2022-07-11 DIAGNOSIS — E7800 Pure hypercholesterolemia, unspecified: Secondary | ICD-10-CM | POA: Diagnosis not present

## 2022-07-11 DIAGNOSIS — K9289 Other specified diseases of the digestive system: Secondary | ICD-10-CM

## 2022-07-11 DIAGNOSIS — I10 Essential (primary) hypertension: Secondary | ICD-10-CM | POA: Diagnosis not present

## 2022-07-11 DIAGNOSIS — J8283 Eosinophilic asthma: Secondary | ICD-10-CM

## 2022-07-11 DIAGNOSIS — Z86010 Personal history of colonic polyps: Secondary | ICD-10-CM

## 2022-07-11 DIAGNOSIS — K219 Gastro-esophageal reflux disease without esophagitis: Secondary | ICD-10-CM

## 2022-07-11 LAB — CBC WITH DIFFERENTIAL/PLATELET
Basophils Absolute: 0.1 10*3/uL (ref 0.0–0.1)
Basophils Relative: 1.3 % (ref 0.0–3.0)
Eosinophils Absolute: 0.3 10*3/uL (ref 0.0–0.7)
Eosinophils Relative: 6.8 % — ABNORMAL HIGH (ref 0.0–5.0)
HCT: 40.6 % (ref 36.0–46.0)
Hemoglobin: 13.1 g/dL (ref 12.0–15.0)
Lymphocytes Relative: 35.8 % (ref 12.0–46.0)
Lymphs Abs: 1.7 10*3/uL (ref 0.7–4.0)
MCHC: 32.2 g/dL (ref 30.0–36.0)
MCV: 88.2 fl (ref 78.0–100.0)
Monocytes Absolute: 0.5 10*3/uL (ref 0.1–1.0)
Monocytes Relative: 11.2 % (ref 3.0–12.0)
Neutro Abs: 2.1 10*3/uL (ref 1.4–7.7)
Neutrophils Relative %: 44.9 % (ref 43.0–77.0)
Platelets: 252 10*3/uL (ref 150.0–400.0)
RBC: 4.61 Mil/uL (ref 3.87–5.11)
RDW: 14.4 % (ref 11.5–15.5)
WBC: 4.8 10*3/uL (ref 4.0–10.5)

## 2022-07-11 LAB — BASIC METABOLIC PANEL
BUN: 10 mg/dL (ref 6–23)
CO2: 31 mEq/L (ref 19–32)
Calcium: 9.7 mg/dL (ref 8.4–10.5)
Chloride: 102 mEq/L (ref 96–112)
Creatinine, Ser: 0.83 mg/dL (ref 0.40–1.20)
GFR: 74.62 mL/min (ref >60.00)
Glucose, Bld: 83 mg/dL (ref 70–99)
Potassium: 4.2 mEq/L (ref 3.5–5.1)
Sodium: 143 mEq/L (ref 135–145)

## 2022-07-11 LAB — LIPID PANEL
Cholesterol: 162 mg/dL (ref 0–200)
HDL: 89.1 mg/dL (ref >39.00)
LDL Cholesterol: 59 mg/dL (ref 0–99)
NonHDL: 73.34
Total CHOL/HDL Ratio: 2
Triglycerides: 70 mg/dL (ref 0.0–149.0)
VLDL: 14 mg/dL (ref 0.0–40.0)

## 2022-07-11 LAB — HEPATIC FUNCTION PANEL
ALT: 13 U/L (ref 0–35)
AST: 14 U/L (ref 0–37)
Albumin: 4.1 g/dL (ref 3.5–5.2)
Alkaline Phosphatase: 77 U/L (ref 39–117)
Bilirubin, Direct: 0.1 mg/dL (ref 0.0–0.3)
Total Bilirubin: 0.7 mg/dL (ref 0.2–1.2)
Total Protein: 6.1 g/dL (ref 6.0–8.3)

## 2022-07-11 NOTE — Progress Notes (Signed)
Patient ID: Jackie White, female   DOB: 30-Jul-1956, 66 y.o.   MRN: [id number]   Subjective:    Patient ID: Jackie White, female    DOB: 1956/09/07, 66 y.o.   MRN: [id number]   Patient here for scheduled follow up.   Chief Complaint  Patient presents with   Hypertension   Hyperlipidemia   .   HPI Follow up regarding MS - ocrelizumab infusions.  Followed by Dr Elwyn Reach.  Recommended f/u MRI brain and c-spine.  Last seen 06/22/22.  Stable.  Also evaluated by sports medicine - s/p knee injection. PT. Knee is doing better.  Started osteobiflex.  Also diagnosed with eosinophilic asthma.  Seeing Dr Jayme Cloud.  Prescribed Tezspire. Feels breathing - stable.  Overall feeling better.  Some increased gas over the last two weeks.  Eating.  No vomiting.  No chest pain.  Bowels overall stable.    Past Medical History:  Diagnosis Date   Abnormal Pap smear    Cervical disc disease    Cervical and lumbar disc disease   COVID-19    02/15/22   GERD (gastroesophageal reflux disease)    History of iron deficiency    Hypertension    Multiple sclerosis (HCC)    Personal history of colonic polyps    Reactive airway disease    Vitamin D deficiency    Past Surgical History:  Procedure Laterality Date   BREAST REDUCTION SURGERY     COLONOSCOPY WITH PROPOFOL N/A 03/16/2021   Procedure: COLONOSCOPY WITH PROPOFOL;  Surgeon: Pasty Spillers, MD;  Location: [facility] ENDOSCOPY;  Service: Endoscopy;  Laterality: N/A;   ESOPHAGOGASTRODUODENOSCOPY (EGD) WITH PROPOFOL N/A 03/16/2021   Procedure: ESOPHAGOGASTRODUODENOSCOPY (EGD) WITH PROPOFOL;  Surgeon: Pasty Spillers, MD;  Location: [facility] ENDOSCOPY;  Service: Endoscopy;  Laterality: N/A;   REDUCTION MAMMAPLASTY Bilateral 2002   TOTAL ABDOMINAL HYSTERECTOMY  2003   Right Oophrectomy   Family History  Problem Relation Age of Onset   Stroke Mother    Hypertension Mother    Heart disease Maternal Grandmother    Breast cancer Neg Hx    Social History    Socioeconomic History   Marital status: Married    Spouse name: Not on file   Number of children: Not on file   Years of education: Not on file   Highest education level: Not on file  Occupational History   Not on file  Tobacco Use   Smoking status: Never   Smokeless tobacco: Never  Vaping Use   Vaping Use: Never used  Substance and Sexual Activity   Alcohol use: Yes    Alcohol/week: 0.0 standard drinks of alcohol   Drug use: No   Sexual activity: Not on file  Other Topics Concern   Not on file  Social History Narrative   Not on file   Social Determinants of Health   Financial Resource Strain: Not on file  Food Insecurity: Not on file  Transportation Needs: Not on file  Physical Activity: Not on file  Stress: Not on file  Social Connections: Not on file     Review of Systems  Constitutional:  Negative for appetite change and unexpected weight change.  HENT:  Negative for congestion and sinus pressure.   Respiratory:  Negative for cough, chest tightness and shortness of breath.   Cardiovascular:  Negative for chest pain, palpitations and leg swelling.  Gastrointestinal:  Negative for diarrhea, nausea and vomiting.       Gas as outlined.   Genitourinary:  Negative for difficulty urinating and dysuria.  Musculoskeletal:  Negative for myalgias.  Skin:  Negative for color change and rash.  Neurological:  Negative for dizziness, light-headedness and headaches.  Psychiatric/Behavioral:  Negative for agitation and dysphoric mood.        Objective:     BP 136/70 (BP Location: Left Arm, Patient Position: Sitting, Cuff Size: Large)   Pulse 60   Temp 98.7 F (37.1 C) (Temporal)   Resp 15   Ht 5\' 7"  (1.702 m)   Wt 211 lb (95.7 kg)   LMP 12/27/2000   SpO2 99%   BMI 33.05 kg/m  Wt Readings from Last 3 Encounters:  07/11/22 211 lb (95.7 kg)  03/09/22 207 lb 9.6 oz (94.2 kg)  02/16/22 207 lb (93.9 kg)    Physical Exam Vitals reviewed.  Constitutional:       General: She is not in acute distress.    Appearance: Normal appearance.  HENT:     Head: Normocephalic and atraumatic.     Right Ear: External ear normal.     Left Ear: External ear normal.  Eyes:     General: No scleral icterus.       Right eye: No discharge.        Left eye: No discharge.     Conjunctiva/sclera: Conjunctivae normal.  Neck:     Thyroid: No thyromegaly.  Cardiovascular:     Rate and Rhythm: Normal rate and regular rhythm.  Pulmonary:     Effort: No respiratory distress.     Breath sounds: Normal breath sounds. No wheezing.  Abdominal:     General: Bowel sounds are normal.     Palpations: Abdomen is soft.     Tenderness: There is no abdominal tenderness.  Musculoskeletal:        General: No swelling or tenderness.     Cervical back: Neck supple. No tenderness.  Lymphadenopathy:     Cervical: No cervical adenopathy.  Skin:    Findings: No erythema or rash.  Neurological:     Mental Status: She is alert.  Psychiatric:        Mood and Affect: Mood normal.        Behavior: Behavior normal.      Outpatient Encounter Medications as of 07/11/2022  Medication Sig   albuterol (VENTOLIN HFA) 108 (90 Base) MCG/ACT inhaler Inhale 2 puffs into the lungs every 6 (six) hours as needed for wheezing or shortness of breath (cough).   BREO ELLIPTA 200-25 MCG/ACT AEPB INHALE 1 PUFF INTO THE LUNGS DAILY   cetirizine (ZYRTEC) 10 MG tablet Take by mouth.   esomeprazole (NEXIUM) 40 MG capsule TAKE 1 CAPSULE(40 MG) BY MOUTH DAILY   metoprolol succinate (TOPROL-XL) 25 MG 24 hr tablet TAKE 1 TABLET BY MOUTH DAILY   montelukast (SINGULAIR) 10 MG tablet Take 1 tablet (10 mg total) by mouth daily.   Ocrelizumab (OCREVUS IV) Inject into the vein.   rosuvastatin (CRESTOR) 5 MG tablet TAKE 1 TABLET BY MOUTH EVERY MONDAY, WEDNESDAY, FRIDAY   Tezepelumab-ekko (TEZSPIRE) 210 MG/1.07/13/2022 SOAJ Inject 210 mg into the skin every 28 (twenty-eight) days.   valsartan-hydrochlorothiazide  (DIOVAN-HCT) 320-25 MG tablet TAKE 1 TABLET BY MOUTH DAILY   No facility-administered encounter medications on file as of 07/11/2022.     Lab Results  Component Value Date   WBC 4.8 07/11/2022   HGB 13.1 07/11/2022   HCT 40.6 07/11/2022   PLT 252.0 07/11/2022   GLUCOSE 83 07/11/2022   CHOL 162 07/11/2022  TRIG 70.0 07/11/2022   HDL 89.10 07/11/2022   LDLCALC 59 07/11/2022   ALT 13 07/11/2022   AST 14 07/11/2022   NA 143 07/11/2022   K 4.2 07/11/2022   CL 102 07/11/2022   CREATININE 0.83 07/11/2022   BUN 10 07/11/2022   CO2 31 07/11/2022   TSH 2.22 10/03/2021    MM 3D SCREEN BREAST BILATERAL  Result Date: 11/02/2021 CLINICAL DATA:  Screening. EXAM: DIGITAL SCREENING BILATERAL MAMMOGRAM WITH TOMOSYNTHESIS AND CAD TECHNIQUE: Bilateral screening digital craniocaudal and mediolateral oblique mammograms were obtained. Bilateral screening digital breast tomosynthesis was performed. The images were evaluated with computer-aided detection. COMPARISON:  Previous exam(s). ACR Breast Density Category a: The breast tissue is almost entirely fatty. FINDINGS: There are no findings suspicious for malignancy. Stable post reduction changes. IMPRESSION: No mammographic evidence of malignancy. A result letter of this screening mammogram will be mailed directly to the patient. RECOMMENDATION: Screening mammogram in one year. (Code:SM-B-01Y) BI-RADS CATEGORY  2: Benign. Electronically Signed   By: Valentino Saxon M.D.   On: 11/02/2021 16:42      Assessment & Plan:   Problem List Items Addressed This Visit     Anemia   Aortic atherosclerosis ([place])    Continue crestor.       Eosinophilic asthma    Also diagnosed with eosinophilic asthma.  Seeing Dr Patsey Berthold.  Prescribed Tezspire. Feels breathing - stable.        Essential hypertension    Blood pressure has been doing well.  Continue metoprolol and diovan/hctz.  Follow pressures.  Follow metabolic panel.       Gas bloat syndrome     Reports some increased gas.  Some minimal lower abdominal pain.  Follow for triggers.  Consider benefiber.  Follow. Notify me if persistent.       GERD (gastroesophageal reflux disease)    No acid reflux reported.  On nexium.       History of colon polyps    Colonoscopy 03/16/21 - hemorrhoids and diverticulosis.       Hypercholesterolemia    On crestor.  Low cholesterol diet and exercise.  Follow lipid panel and liver function tests.        Multiple sclerosis ([place])    Receiving ocrevus.  Followed by neurology.  Has done well on this medication.         Einar Pheasant, MD

## 2022-07-20 ENCOUNTER — Encounter: Payer: Self-pay | Admitting: Internal Medicine

## 2022-07-20 NOTE — Telephone Encounter (Signed)
I messaged Jackie White about her blood pressures.  Please add her shingles vaccine to our record.

## 2022-07-23 ENCOUNTER — Encounter: Payer: Self-pay | Admitting: Internal Medicine

## 2022-07-23 DIAGNOSIS — K9289 Other specified diseases of the digestive system: Secondary | ICD-10-CM | POA: Insufficient documentation

## 2022-07-23 NOTE — Assessment & Plan Note (Signed)
No acid reflux reported.  On nexium.

## 2022-07-23 NOTE — Assessment & Plan Note (Signed)
Colonoscopy 03/16/21 - hemorrhoids and diverticulosis.  

## 2022-07-23 NOTE — Assessment & Plan Note (Signed)
Continue crestor 

## 2022-07-23 NOTE — Assessment & Plan Note (Signed)
Reports some increased gas.  Some minimal lower abdominal pain.  Follow for triggers.  Consider benefiber.  Follow. Notify me if persistent.

## 2022-07-23 NOTE — Assessment & Plan Note (Signed)
Also diagnosed with eosinophilic asthma.  Seeing Dr Jayme Cloud.  Prescribed Tezspire. Feels breathing - stable.

## 2022-07-23 NOTE — Assessment & Plan Note (Signed)
Blood pressure has been doing well.  Continue metoprolol and diovan/hctz.  Follow pressures.  Follow metabolic panel.  

## 2022-07-23 NOTE — Assessment & Plan Note (Signed)
Receiving ocrevus.  Followed by neurology.  Has done well on this medication.  

## 2022-07-23 NOTE — Assessment & Plan Note (Signed)
On crestor.  Low cholesterol diet and exercise.  Follow lipid panel and liver function tests.   

## 2022-08-15 ENCOUNTER — Ambulatory Visit: Payer: BC Managed Care – PPO | Admitting: Pulmonary Disease

## 2022-08-15 ENCOUNTER — Encounter: Payer: Self-pay | Admitting: Pulmonary Disease

## 2022-08-15 VITALS — BP 118/80 | HR 60 | Temp 98.0°F | Ht 67.5 in | Wt 208.0 lb

## 2022-08-15 DIAGNOSIS — G35 Multiple sclerosis: Secondary | ICD-10-CM | POA: Diagnosis not present

## 2022-08-15 DIAGNOSIS — J4551 Severe persistent asthma with (acute) exacerbation: Secondary | ICD-10-CM

## 2022-08-15 DIAGNOSIS — J4550 Severe persistent asthma, uncomplicated: Secondary | ICD-10-CM | POA: Diagnosis not present

## 2022-08-15 LAB — NITRIC OXIDE: Nitric Oxide: 181

## 2022-08-15 MED ORDER — METHYLPREDNISOLONE 4 MG PO TBPK: ORAL_TABLET | ORAL | 0 refills | Status: DC

## 2022-08-15 NOTE — Progress Notes (Signed)
Subjective:    Patient ID: Jackie White, female    DOB: 1956-11-20, 66 y.o.   MRN: [id number] Patient Care Team: Dale [place], MD as PCP - General (Internal Medicine)  Chief Complaint  Patient presents with   Follow-up    [name] is helping. Some congestion starting back. Cough in the morning and at night.   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  I last connected with the patient on 22 February 2022 during virtual visit due to her having COVID-19 at the time.  She actually did well after that episode.  Subsequently in June she started [facility] which had been prescribed for her.  Though it is early to tell if this will be helpful in controlling her severe asthma she notes that she has had some improvement since starting the medication.  Usually maximum effect of these medications is seen approximately at the 49-month mark.  She is doing self injection without difficulty.  This is a once a month preparation.  Tezspire was chosen due to the patient being on Ocrevus for multiple sclerosis and other injectables interacting with Ocrevus.  She actually notes that today is a "pretty good day" though she has noted some increased congestion starting a day or so ago mainly with some cough productive of clear sputum in the mornings and in the evenings.  She is compliant with her Singulair and Breo Ellipta 200.  Her prior allergen panel has shown that she has significant ragweed sensitivity.  She does not endorse any fevers, chills or sweats.  As noted the sputum has not been discolored.  She has not had any hemoptysis.  No lower extremity edema or calf tenderness.  No weight pains or swelling.   Review of Systems A 10 point review of systems was performed and it is as noted above otherwise negative.  Patient Active Problem List   Diagnosis Date Noted   Gas  bloat syndrome 07/23/2022   History of COVID-19 03/09/2022   Eosinophilic asthma 11/17/2021   Eosinophilia 10/10/2021   History of colon polyps 03/10/2021   History of iron deficiency 03/10/2021   PUD (peptic ulcer disease) 03/10/2021   Aortic atherosclerosis (HCC) 01/24/2021   Skin lesion 09/19/2020   Cough variant asthma 11/19/2019   Abnormal EKG 02/21/2019   Family history of coronary artery disease 02/21/2019   Cough 01/05/2019   Shortness of breath 01/05/2019   Wheezing 07/21/2018   Impingement syndrome of left shoulder region 05/01/2017   Osteoarthritis of knee 05/01/2017   Trochanteric bursitis of right hip 05/01/2017   Left shoulder pain 04/24/2017   Right knee pain 04/24/2017   Hemorrhoids 08/02/2016   Low back pain 05/17/2016   Right hip pain 05/17/2016   Chest tightness 03/01/2016   Rash 01/31/2015   Health care maintenance 01/31/2015   Left knee pain 10/12/2013   Pain in joint involving lower leg 10/12/2013   GERD (gastroesophageal reflux disease) 12/29/2012   Abnormal Pap smear of cervix 12/29/2012   Essential hypertension 12/25/2012   Anemia 12/25/2012   Degeneration of lumbar or lumbosacral intervertebral disc 12/25/2012   Hypercholesterolemia 12/25/2012   Vitamin D deficiency 12/25/2012   Multiple sclerosis (HCC) 12/25/2012       Objective:   Physical Exam BP 118/80 (BP Location: Left Arm, Cuff Size: Normal)   Pulse 60   Temp 98 F (36.7 C)   Ht  5' 7.5" (1.715 m)   Wt 208 lb (94.3 kg)   LMP 12/27/2000   SpO2 100%   BMI 32.10 kg/m  GENERAL: Overweight woman, no acute distress.  Fully ambulatory.  No conversational dyspnea.  No respiratory distress. HEAD: Normocephalic, atraumatic.   EYES: Pupils equal, round, reactive to light.  No scleral icterus. MOUTH: Nose/mouth/throat not examined due to masking requirements for COVID 19. NECK: Supple. No thyromegaly. Trachea midline. No JVD.  No adenopathy. PULMONARY: Good air entry bilaterally.  Few end  expiratory wheezes noted.   CARDIOVASCULAR: S1 and S2. Regular rate and rhythm.  No rubs, murmurs or gallops heard. ABDOMEN: Benign. MUSCULOSKELETAL: No joint deformity, no clubbing, no edema. NEUROLOGIC: No focal deficit, no gait disturbance, speech is fluent. SKIN: Intact,warm,dry.  No rashes. PSYCH: Mood and behavior normal  Lab Results  Component Value Date   NITRICOXIDE 181 08/15/2022      Assessment & Plan:     ICD-10-CM   1. Severe persistent asthma with acute exacerbation  J45.51 Nitric oxide   Suspect allergic triggered She is very sensitive to ragweed FeNO 181 Medrol Dosepak    2. Multiple sclerosis ([place])  G35    Issue adds complexity to her management Patient on Ocrevus This aggravates her respiratory status     Orders Placed This Encounter  Procedures   Nitric oxide   We will treat the patient with Medrol Dosepak 21 tablets, take as directed.  He is to continue her current inhaler medications as well as Tezspire.  We will see the patient in follow-up in 6 to 8 weeks time she is to contact us prior to that time should any new difficulties arise.  Renold Don, MD Advanced Bronchoscopy PCCM [facility]    *This note was dictated using voice recognition software/Dragon.  Despite best efforts to proofread, errors can occur which can change the meaning. Any transcriptional errors that result from this process are unintentional and may not be fully corrected at the time of dictation.

## 2022-08-15 NOTE — Patient Instructions (Signed)
Your airway inflammation was high today.  We are giving you a Medrol Dosepak.  We will see you back in 4 to 6 weeks to make sure you are doing well.  Please let us know if you have any issues before then.  Continue your Tezspire injections.

## 2022-08-25 ENCOUNTER — Encounter: Payer: Self-pay | Admitting: Pulmonary Disease

## 2022-08-27 ENCOUNTER — Other Ambulatory Visit: Payer: Self-pay | Admitting: Internal Medicine

## 2022-09-03 ENCOUNTER — Other Ambulatory Visit: Payer: Self-pay | Admitting: Internal Medicine

## 2022-09-03 ENCOUNTER — Other Ambulatory Visit: Payer: Self-pay | Admitting: Pulmonary Disease

## 2022-09-03 DIAGNOSIS — J45991 Cough variant asthma: Secondary | ICD-10-CM

## 2022-09-27 ENCOUNTER — Encounter: Payer: Self-pay | Admitting: Pulmonary Disease

## 2022-09-27 ENCOUNTER — Ambulatory Visit: Payer: BC Managed Care – PPO | Admitting: Pulmonary Disease

## 2022-09-27 VITALS — BP 122/82 | HR 62 | Temp 97.6°F | Ht 67.5 in | Wt 210.4 lb

## 2022-09-27 DIAGNOSIS — J4551 Severe persistent asthma with (acute) exacerbation: Secondary | ICD-10-CM | POA: Diagnosis not present

## 2022-09-27 DIAGNOSIS — G35 Multiple sclerosis: Secondary | ICD-10-CM

## 2022-09-27 DIAGNOSIS — J4550 Severe persistent asthma, uncomplicated: Secondary | ICD-10-CM | POA: Diagnosis not present

## 2022-09-27 LAB — NITRIC OXIDE: Nitric Oxide: 133

## 2022-09-27 NOTE — Patient Instructions (Signed)
Continue your medications as you are doing.  Your inflammation in the airway is coming down which is a good sign.  You in follow-up in 3 months time call sooner should any new problems arise.

## 2022-09-27 NOTE — Progress Notes (Signed)
Subjective:    Patient ID: Jackie White, female    DOB: February 23, 1956, 66 y.o.   MRN: [id number] Patient Care Team: Dale Ralls, MD as PCP - General (Internal Medicine)  Chief Complaint  Patient presents with   Follow-up    SOB in the morning and with exertion. Slight wheezing. Cough with yellowish sputum in the morning.   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  I last saw the patient on 15 August 2022 and at that time nitric oxide level was high at 181 despite the patient stating that she felt fairly well.  She started Tezspire in June which had been prescribed for her.  And she continues to note that her breathing has improved with the Tezspire.  She is doing self injection without difficulty.  This is a once a month preparation. Tezspire was chosen due to the patient being on Ocrevus for multiple sclerosis and other injectables interacting with Ocrevus.   She actually notes that today she feels well, the previously noted congestion she had at her prior visit has cleared.  She has not had cough recently.  She does note excessive mucus production in the mornings but this has been an issue since she started Ocrevus.  She is compliant with her Singulair and Breo Ellipta 200.  Her prior allergen panel has shown that she has significant ragweed sensitivity.  She does not endorse any fevers, chills or sweats.  Sputum in the mornings is usually clear to white.  She has not had any hemoptysis.  No lower extremity edema or calf tenderness.  No chest pain.  No shortness of breath.  Overall, she feels well and looks well.  Review of Systems A 10 point review of systems was performed and it is as noted above otherwise negative.    Objective:   Physical Exam BP 122/82 (BP Location: Left Arm, Cuff Size: Normal)   Pulse 62   Temp 97.6 F (36.4 C)   Ht  5' 7.5" (1.715 m)   Wt 210 lb 6.4 oz (95.4 kg)   LMP 12/27/2000   SpO2 100%   BMI 32.47 kg/m  GENERAL: Overweight woman, no acute distress.  Fully ambulatory.  No conversational dyspnea.  No respiratory distress. HEAD: Normocephalic, atraumatic.   EYES: Pupils equal, round, reactive to light.  No scleral icterus. MOUTH: Nose/mouth/throat not examined due to masking requirements for COVID 19. NECK: Supple. No thyromegaly. Trachea midline. No JVD.  No adenopathy. PULMONARY: Good air entry bilaterally.  No adventitious sounds noted.   CARDIOVASCULAR: S1 and S2. Regular rate and rhythm.  No rubs, murmurs or gallops heard. ABDOMEN: Benign. MUSCULOSKELETAL: No joint deformity, no clubbing, no edema. NEUROLOGIC: No focal deficit, no gait disturbance, speech is fluent. SKIN: Intact,warm,dry.  No rashes. PSYCH: Mood and behavior normal    Lab Results  Component Value Date   NITRICOXIDE 133 09/27/2022  Type II inflammation present, decreasing from prior.     Assessment & Plan:     ICD-10-CM   1. Severe persistent asthma without complication  J45.50 Nitric oxide   Continue Tezspire Continue Singulair and Breo Ellipta Continue as needed albuterol Follow-up in 3 months time    2. Multiple sclerosis (HCC)  G35    Patient is on Ocrevus which can aggravate respiratory symptoms This issue adds complexity to her management     Orders Placed This Encounter  Procedures   Nitric oxide   We will see the patient in follow-up in 3 months time she is to contact us prior to that time should any new difficulties arise.  Gailen Shelter, MD Advanced Bronchoscopy PCCM [facility]    *This note was dictated using voice recognition software/Dragon.  Despite best efforts to proofread, errors can occur which can change the meaning. Any transcriptional errors that result from this process are unintentional and may not be fully corrected at the time of dictation.

## 2022-09-28 ENCOUNTER — Encounter: Payer: Self-pay | Admitting: Internal Medicine

## 2022-09-28 DIAGNOSIS — J4550 Severe persistent asthma, uncomplicated: Secondary | ICD-10-CM | POA: Insufficient documentation

## 2022-09-29 ENCOUNTER — Telehealth: Payer: Self-pay | Admitting: Pharmacist

## 2022-09-29 NOTE — Telephone Encounter (Signed)
Submitted a Prior Authorization RENEWAL request to [facility] for TEZSPIRE via CoverMyMeds. Will update once we receive a response.  Key: [id number]  Per automated response: Please reach out to the Pharmacy Help Desk for further assistance [phone]. This is likely due to current authorization expiring on 10/25/22. Can attempt closer to expiration date or reach out to [facility] for fax of PA form to manually complete  Chesley Mires, PharmD, MPH, BCPS, CPP Clinical Pharmacist (Rheumatology and Pulmonology)

## 2022-10-04 NOTE — Telephone Encounter (Signed)
Received faxed PA renewal form from  [facility] Plan  for TEZSPIRE. Completed PA questions and submitted request via fax along with supporting chart notes. Will await response.  Phone # [phone] Fax # [phone]

## 2022-10-05 NOTE — Telephone Encounter (Signed)
Received notification from [facility] regarding a prior authorization for TEZSPIRE. Authorization has been APPROVED from 10/04/22 to 10/05/23.   Patient must continue to fill through [facility]: [phone]  Authorization # [id number]  Chesley Mires, PharmD, MPH, BCPS, CPP Clinical Pharmacist (Rheumatology and Pulmonology)

## 2022-10-18 ENCOUNTER — Encounter: Payer: Self-pay | Admitting: Internal Medicine

## 2022-10-18 ENCOUNTER — Ambulatory Visit: Payer: BC Managed Care – PPO | Admitting: Internal Medicine

## 2022-10-18 VITALS — BP 126/80 | HR 104 | Temp 98.0°F | Resp 17 | Ht 67.5 in | Wt 213.6 lb

## 2022-10-18 DIAGNOSIS — I10 Essential (primary) hypertension: Secondary | ICD-10-CM | POA: Diagnosis not present

## 2022-10-18 DIAGNOSIS — R0981 Nasal congestion: Secondary | ICD-10-CM

## 2022-10-18 DIAGNOSIS — E7800 Pure hypercholesterolemia, unspecified: Secondary | ICD-10-CM | POA: Diagnosis not present

## 2022-10-18 DIAGNOSIS — K219 Gastro-esophageal reflux disease without esophagitis: Secondary | ICD-10-CM

## 2022-10-18 DIAGNOSIS — J8283 Eosinophilic asthma: Secondary | ICD-10-CM | POA: Diagnosis not present

## 2022-10-18 DIAGNOSIS — Z86010 Personal history of colonic polyps: Secondary | ICD-10-CM

## 2022-10-18 DIAGNOSIS — D649 Anemia, unspecified: Secondary | ICD-10-CM

## 2022-10-18 DIAGNOSIS — G35 Multiple sclerosis: Secondary | ICD-10-CM

## 2022-10-18 DIAGNOSIS — I700 Atherosclerosis of aorta: Secondary | ICD-10-CM

## 2022-10-18 MED ORDER — DOXYCYCLINE HYCLATE 100 MG PO TABS: 100.0000 mg | ORAL_TABLET | Freq: Two times a day (BID) | ORAL | 0 refills | Status: DC

## 2022-10-18 NOTE — Progress Notes (Signed)
Patient ID: Jackie White, female   DOB: 06/03/1956, 66 y.o.   MRN: [id number]   Subjective:    Patient ID: Jackie White, female    DOB: [birth year], 66 y.o.   MRN: [id number]   Patient here for  Chief Complaint  Patient presents with   Sinusitis   .   HPI Here to follow up regarding hypercholesterolemia and hypertension.  Seeing Dr Jayme Cloud. Per report, breathing improved with Tezspire.  Last evaluated 09/27/22. Compliant with singulair and Breo.  Seeing sports medicine for knee.  Last evaluated 10/16/22 - recommended PT and ice. She reports she had been doing relatively well.  Breathing better.  Over the last few days has noticed - scratchy throat.  Slight headache.  Increased post nasal drainage.  Nasal stuffiness.  Minimal cough.  Discussed taking mucinex.  No fever.  No body aches.  No sob.  Eating.     Past Medical History:  Diagnosis Date   Abnormal Pap smear    Cervical disc disease    Cervical and lumbar disc disease   COVID-19    02/15/22   GERD (gastroesophageal reflux disease)    History of iron deficiency    Hypertension    Multiple sclerosis (HCC)    Personal history of colonic polyps    Reactive airway disease    Vitamin D deficiency    Past Surgical History:  Procedure Laterality Date   BREAST REDUCTION SURGERY     COLONOSCOPY WITH PROPOFOL N/A 03/16/2021   Procedure: COLONOSCOPY WITH PROPOFOL;  Surgeon: Pasty Spillers, MD;  Location: [facility] ENDOSCOPY;  Service: Endoscopy;  Laterality: N/A;   ESOPHAGOGASTRODUODENOSCOPY (EGD) WITH PROPOFOL N/A 03/16/2021   Procedure: ESOPHAGOGASTRODUODENOSCOPY (EGD) WITH PROPOFOL;  Surgeon: Pasty Spillers, MD;  Location: [facility] ENDOSCOPY;  Service: Endoscopy;  Laterality: N/A;   REDUCTION MAMMAPLASTY Bilateral 2002   TOTAL ABDOMINAL HYSTERECTOMY  2003   Right Oophrectomy   Family History  Problem Relation Age of Onset   Stroke Mother    Hypertension Mother    Heart disease Maternal Grandmother    Breast cancer Neg  Hx    Social History   Socioeconomic History   Marital status: Married    Spouse name: Not on file   Number of children: Not on file   Years of education: Not on file   Highest education level: Not on file  Occupational History   Not on file  Tobacco Use   Smoking status: Never   Smokeless tobacco: Never  Vaping Use   Vaping Use: Never used  Substance and Sexual Activity   Alcohol use: Yes    Alcohol/week: 0.0 standard drinks of alcohol   Drug use: No   Sexual activity: Not on file  Other Topics Concern   Not on file  Social History Narrative   Not on file   Social Determinants of Health   Financial Resource Strain: Not on file  Food Insecurity: Not on file  Transportation Needs: Not on file  Physical Activity: Not on file  Stress: Not on file  Social Connections: Not on file     Review of Systems  Constitutional:  Negative for appetite change and fever.  HENT:  Positive for congestion and postnasal drip.        Scratchy throat.   Respiratory:  Negative for chest tightness and shortness of breath.        Minimal cough.   Cardiovascular:  Negative for chest pain, palpitations and leg swelling.  Gastrointestinal:  Negative  for abdominal pain, diarrhea, nausea and vomiting.  Genitourinary:  Negative for difficulty urinating and dysuria.  Musculoskeletal:  Negative for joint swelling and myalgias.  Skin:  Negative for color change and rash.  Neurological:  Negative for dizziness, light-headedness and headaches.  Psychiatric/Behavioral:  Negative for agitation and dysphoric mood.        Objective:     BP 126/80 (BP Location: Left Arm, Patient Position: Sitting, Cuff Size: Large)   Pulse (!) 104   Temp 98 F (36.7 C) (Temporal)   Resp 17   Ht 5' 7.5" (1.715 m)   Wt 213 lb 9.6 oz (96.9 kg)   LMP 12/27/2000   SpO2 99%   BMI 32.96 kg/m  Wt Readings from Last 3 Encounters:  10/18/22 213 lb 9.6 oz (96.9 kg)  09/27/22 210 lb 6.4 oz (95.4 kg)  08/15/22 208 lb  (94.3 kg)    Physical Exam Vitals reviewed.  Constitutional:      General: She is not in acute distress.    Appearance: Normal appearance.  HENT:     Head: Normocephalic and atraumatic.     Right Ear: External ear normal.     Left Ear: External ear normal.  Eyes:     General: No scleral icterus.       Right eye: No discharge.        Left eye: No discharge.     Conjunctiva/sclera: Conjunctivae normal.  Neck:     Thyroid: No thyromegaly.  Cardiovascular:     Rate and Rhythm: Normal rate and regular rhythm.  Pulmonary:     Effort: No respiratory distress.     Breath sounds: Normal breath sounds. No wheezing.  Abdominal:     General: Bowel sounds are normal.     Palpations: Abdomen is soft.     Tenderness: There is no abdominal tenderness.  Musculoskeletal:        General: No swelling or tenderness.     Cervical back: Neck supple. No tenderness.  Lymphadenopathy:     Cervical: No cervical adenopathy.  Skin:    Findings: No erythema or rash.  Neurological:     Mental Status: She is alert.  Psychiatric:        Mood and Affect: Mood normal.        Behavior: Behavior normal.      Outpatient Encounter Medications as of 10/18/2022  Medication Sig   albuterol (VENTOLIN HFA) 108 (90 Base) MCG/ACT inhaler Inhale 2 puffs into the lungs every 6 (six) hours as needed for wheezing or shortness of breath (cough).   BREO ELLIPTA 200-25 MCG/ACT AEPB INHALE 1 PUFF INTO THE LUNGS DAILY   cetirizine (ZYRTEC) 10 MG tablet Take by mouth.   doxycycline (VIBRA-TABS) 100 MG tablet Take 1 tablet (100 mg total) by mouth 2 (two) times daily.   esomeprazole (NEXIUM) 40 MG capsule TAKE 1 CAPSULE(40 MG) BY MOUTH DAILY   Glucosamine-Chondroitin 250-200 MG TABS Take 2 tablets by mouth daily.   metoprolol succinate (TOPROL-XL) 25 MG 24 hr tablet TAKE 1 TABLET BY MOUTH DAILY   montelukast (SINGULAIR) 10 MG tablet Take 1 tablet (10 mg total) by mouth daily.   Ocrelizumab (OCREVUS IV) Inject into the  vein.   rosuvastatin (CRESTOR) 5 MG tablet TAKE 1 TABLET BY MOUTH EVERY MONDAY, WEDNESDAY, FRIDAY   Tezepelumab-ekko (TEZSPIRE) 210 MG/1. SOAJ Inject 210 mg into the skin every 28 (twenty-eight) days.   valsartan-hydrochlorothiazide (DIOVAN-HCT) 320-25 MG tablet TAKE 1 TABLET BY MOUTH DAILY   [DISCONTINUED]  methylPREDNISolone (MEDROL DOSEPAK) 4 MG TBPK tablet Use as directed (Patient not taking: Reported on 09/27/2022)   No facility-administered encounter medications on file as of 10/18/2022.     Lab Results  Component Value Date   WBC 4.8 07/11/2022   HGB 13.1 07/11/2022   HCT 40.6 07/11/2022   PLT 252.0 07/11/2022   GLUCOSE 83 07/11/2022   CHOL 162 07/11/2022   TRIG 70.0 07/11/2022   HDL 89.10 07/11/2022   LDLCALC 59 07/11/2022   ALT 13 07/11/2022   AST 14 07/11/2022   NA 143 07/11/2022   K 4.2 07/11/2022   CL 102 07/11/2022   CREATININE 0.83 07/11/2022   BUN 10 07/11/2022   CO2 31 07/11/2022   TSH 2.22 10/03/2021    MM 3D SCREEN BREAST BILATERAL  Result Date: 11/02/2021 CLINICAL DATA:  Screening. EXAM: DIGITAL SCREENING BILATERAL MAMMOGRAM WITH TOMOSYNTHESIS AND CAD TECHNIQUE: Bilateral screening digital craniocaudal and mediolateral oblique mammograms were obtained. Bilateral screening digital breast tomosynthesis was performed. The images were evaluated with computer-aided detection. COMPARISON:  Previous exam(s). ACR Breast Density Category a: The breast tissue is almost entirely fatty. FINDINGS: There are no findings suspicious for malignancy. Stable post reduction changes. IMPRESSION: No mammographic evidence of malignancy. A result letter of this screening mammogram will be mailed directly to the patient. RECOMMENDATION: Screening mammogram in one year. (Code:SM-B-01Y) BI-RADS CATEGORY  2: Benign. Electronically Signed   By: Meda Klinefelter M.D.   On: 11/02/2021 16:42      Assessment & Plan:   Problem List Items Addressed This Visit     Anemia    Follow cbc.        Aortic atherosclerosis (HCC)    Continue crestor.       Eosinophilic asthma    Seeing Dr Jayme Cloud.  Prescribed Tezspire. Feels breathing has been stable.        Relevant Orders   CBC with Differential/Platelet   Essential hypertension - Primary    Blood pressure has been doing well.  Continue metoprolol and diovan/hctz.  Follow pressures.  Follow metabolic panel.       Relevant Orders   Basic metabolic panel   GERD (gastroesophageal reflux disease)    No acid reflux reported.  On nexium. 03/16/21 - reactive gastritis.       History of colon polyps    Colonoscopy 03/16/21 - hemorrhoids and diverticulosis.       Hypercholesterolemia    On crestor.  Low cholesterol diet and exercise.  Follow lipid panel and liver function tests.        Relevant Orders   Lipid panel   Hepatic function panel   TSH   Multiple sclerosis (HCC)    Receiving ocrevus.  Followed by neurology.  Has done well on this medication.       Nasal congestion    Nasal congestion with minimal cough.  Discussed possible etiologies.  Nasal swab negative for covid.  Discussed treatment - saline nasal spray/steroid nasal spray.  Mucinex/robitussin DM.  Rx given for doxycycline - to use if symptoms progress.  Rest.  Fluids.  Follow.  Call with update.          Dale Northwood, MD

## 2022-10-19 ENCOUNTER — Encounter: Payer: Self-pay | Admitting: Internal Medicine

## 2022-10-19 NOTE — Telephone Encounter (Signed)
Pt called stating she left a message on mychart about a medication. Pt stated she found someone who can take the medication to the pharmacy so she dos not need the provider to send it over automatically

## 2022-10-19 NOTE — Telephone Encounter (Signed)
S/w pt - stated she did have someone take rx over to pharmacy for her, and they are going to pick it up this evening. Pt will start on antibiotics when picked up.   Pt advised to please keep [place] updated as to how she is doing. If anything gets worse, needs re-eval - needs to call us.

## 2022-10-19 NOTE — Telephone Encounter (Signed)
Please call her.  I had given her a prescription for an abx to hang on to and get filled if symptoms worsen.  Please let her know that I was not in the office this am.  It appears she has someone that can take the rx to the pharmacy. Please tell her to keep me posted on how she is doing.  I did not prescribe prednisone , etc and I want to follow how she is doing in case changes need to be made.

## 2022-10-23 ENCOUNTER — Encounter: Payer: Self-pay | Admitting: Internal Medicine

## 2022-10-23 ENCOUNTER — Other Ambulatory Visit: Payer: Self-pay | Admitting: Internal Medicine

## 2022-10-23 DIAGNOSIS — R0981 Nasal congestion: Secondary | ICD-10-CM | POA: Insufficient documentation

## 2022-10-23 DIAGNOSIS — Z1231 Encounter for screening mammogram for malignant neoplasm of breast: Secondary | ICD-10-CM

## 2022-10-23 NOTE — Assessment & Plan Note (Signed)
No acid reflux reported.  On nexium. 03/16/21 - reactive gastritis.  

## 2022-10-23 NOTE — Assessment & Plan Note (Signed)
Nasal congestion with minimal cough.  Discussed possible etiologies.  Nasal swab negative for covid.  Discussed treatment - saline nasal spray/steroid nasal spray.  Mucinex/robitussin DM.  Rx given for doxycycline - to use if symptoms progress.  Rest.  Fluids.  Follow.  Call with update.

## 2022-10-23 NOTE — Assessment & Plan Note (Signed)
Continue crestor 

## 2022-10-23 NOTE — Assessment & Plan Note (Signed)
Follow cbc.  

## 2022-10-23 NOTE — Assessment & Plan Note (Signed)
Seeing Dr Jayme Cloud.  Prescribed Tezspire. Feels breathing has been stable.

## 2022-10-23 NOTE — Assessment & Plan Note (Signed)
Receiving ocrevus.  Followed by neurology.  Has done well on this medication.

## 2022-10-23 NOTE — Assessment & Plan Note (Signed)
Colonoscopy 03/16/21 - hemorrhoids and diverticulosis.  

## 2022-10-23 NOTE — Assessment & Plan Note (Signed)
On crestor.  Low cholesterol diet and exercise.  Follow lipid panel and liver function tests.   

## 2022-10-23 NOTE — Assessment & Plan Note (Signed)
Blood pressure has been doing well.  Continue metoprolol and diovan/hctz.  Follow pressures.  Follow metabolic panel.  

## 2022-10-25 ENCOUNTER — Other Ambulatory Visit: Payer: Self-pay | Admitting: Pharmacist

## 2022-10-25 DIAGNOSIS — J4550 Severe persistent asthma, uncomplicated: Secondary | ICD-10-CM

## 2022-10-25 MED ORDER — TEZSPIRE 210 MG/1.91ML ~~LOC~~ SOAJ: 210.0000 mg | SUBCUTANEOUS | 2 refills | Status: DC

## 2022-10-25 NOTE — Telephone Encounter (Signed)
[facility]: [phone]  Dose: 210 mg SQ every 4 weeks  Last OV: 09/27/2022 Provider: Dr. Jayme Cloud   Next OV: 3 months (not yet scheduled)  Jackie White, PharmD, MPH, BCPS Clinical Pharmacist (Rheumatology and Pulmonology)

## 2022-11-09 ENCOUNTER — Ambulatory Visit
Admission: RE | Admit: 2022-11-09 | Discharge: 2022-11-09 | Disposition: A | Discharge status: Home / Self Care | Payer: BC Managed Care – PPO | Source: Ambulatory Visit | Attending: Internal Medicine | Admitting: Internal Medicine

## 2022-11-09 DIAGNOSIS — Z1231 Encounter for screening mammogram for malignant neoplasm of breast: Secondary | ICD-10-CM | POA: Insufficient documentation

## 2022-12-01 ENCOUNTER — Other Ambulatory Visit: Payer: Self-pay | Admitting: Internal Medicine

## 2022-12-01 ENCOUNTER — Other Ambulatory Visit: Payer: Self-pay | Admitting: Pulmonary Disease

## 2022-12-01 DIAGNOSIS — J45991 Cough variant asthma: Secondary | ICD-10-CM

## 2022-12-25 ENCOUNTER — Other Ambulatory Visit: Payer: Self-pay | Admitting: Internal Medicine

## 2023-01-04 ENCOUNTER — Encounter: Payer: Self-pay | Admitting: Pulmonary Disease

## 2023-01-04 ENCOUNTER — Ambulatory Visit: Payer: BC Managed Care – PPO | Admitting: Pulmonary Disease

## 2023-01-04 VITALS — BP 124/78 | HR 67 | Temp 97.7°F | Ht 67.5 in | Wt 213.0 lb

## 2023-01-04 DIAGNOSIS — J4550 Severe persistent asthma, uncomplicated: Secondary | ICD-10-CM

## 2023-01-04 DIAGNOSIS — G35 Multiple sclerosis: Secondary | ICD-10-CM | POA: Diagnosis not present

## 2023-01-04 DIAGNOSIS — R053 Chronic cough: Secondary | ICD-10-CM

## 2023-01-04 LAB — NITRIC OXIDE: Nitric Oxide: 117

## 2023-01-04 MED ORDER — AIRSUPRA 90-80 MCG/ACT IN AERO: 2.0000 | INHALATION_SPRAY | RESPIRATORY_TRACT | 6 refills | Status: DC | PRN

## 2023-01-04 NOTE — Progress Notes (Signed)
Subjective:    Patient ID: Jackie White, female    DOB: [birth year], 66 y.o.   MRN: [id number] Patient Care Team: Einar Pheasant, MD as PCP - General (Internal Medicine) Tyler Pita, MD as Consulting Physician (Pulmonary Disease)  Chief Complaint  Patient presents with   Follow-up    No SOB or wheezing. Chest congestion for a couple of weeks. Cough with yellow sputum.     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  I last saw the patient on 27 September 2022 previously during as September visit nitric oxide level was high at 181 despite the patient stating that she felt fairly well.  She was started on Tezspire in June.  She is tolerating this medication well.  At her prior visit in November nitric oxide level had decreased to 133. She continues to note that her breathing has improved with the Tezspire.  She is doing self injection without difficulty. This is a once a month preparation. Tezspire was chosen due to the patient being on Ocrevus for multiple sclerosis and other injectables interacting with Ocrevus.   She actually notes that today she feels well, she has minimal chest congestion that she attributes to the weather change.  She does tend to have seasonal variation with her symptoms.  She has had minimal to no cough.  She does note excessive mucus production in the mornings but this has been an issue since she started Ocrevus, no significant change.  She is compliant with her Singulair and Breo Ellipta 200.  Her prior allergen panel has shown that she has significant ragweed sensitivity.  She does not endorse any fevers, chills or sweats.  Sputum in the mornings is usually clear to white.  She has not had any hemoptysis.  No lower extremity edema or calf tenderness.  No chest pain.  No shortness of breath.  Overall, she feels well and looks  well.    Review of Systems A 10 point review of systems was performed and it is as noted above otherwise negative.  Patient Active Problem List   Diagnosis Date Noted   Nasal congestion 10/23/2022   Severe persistent asthma 09/28/2022   Gas bloat syndrome 07/23/2022   History of COVID-19 Q000111Q   Eosinophilic asthma 123XX123   Eosinophilia 10/10/2021   History of colon polyps 03/10/2021   History of iron deficiency 03/10/2021   PUD (peptic ulcer disease) 03/10/2021   Aortic atherosclerosis ([place]) 01/24/2021   Skin lesion 09/19/2020   Cough variant asthma 11/19/2019   Abnormal EKG 02/21/2019   Family history of coronary artery disease 02/21/2019   Cough 01/05/2019   Shortness of breath 01/05/2019   Wheezing 07/21/2018   Impingement syndrome of left shoulder region 05/01/2017   Osteoarthritis of knee 05/01/2017   Trochanteric bursitis of right hip 05/01/2017   Left shoulder pain 04/24/2017   Right knee pain 04/24/2017   Hemorrhoids 08/02/2016   Low back pain 05/17/2016   Right hip pain 05/17/2016   Chest tightness 03/01/2016   Rash 01/31/2015   Health care maintenance 01/31/2015   Left knee pain 10/12/2013   Pain in joint involving lower leg 10/12/2013   GERD (gastroesophageal reflux disease) 12/29/2012   Abnormal Pap smear of cervix 12/29/2012   Essential hypertension 12/25/2012   Anemia 12/25/2012   Degeneration of lumbar or lumbosacral intervertebral disc 12/25/2012   Hypercholesterolemia 12/25/2012  Vitamin D deficiency 12/25/2012   Multiple sclerosis ([place]) 12/25/2012   Social History   Tobacco Use   Smoking status: Never   Smokeless tobacco: Never  Substance Use Topics   Alcohol use: Yes    Alcohol/week: 0.0 standard drinks of alcohol   No Known Allergies  Current Meds  Medication Sig   albuterol (VENTOLIN HFA) 108 (90 Base) MCG/ACT inhaler Inhale 2 puffs into the lungs every 6 (six) hours as needed for wheezing or shortness of breath (cough).    BREO ELLIPTA 200-25 MCG/ACT AEPB INHALE 1 PUFF INTO THE LUNGS DAILY   cetirizine (ZYRTEC) 10 MG tablet Take by mouth.   esomeprazole (NEXIUM) 40 MG capsule TAKE 1 CAPSULE(40 MG) BY MOUTH DAILY   Glucosamine-Chondroitin 250-200 MG TABS Take 2 tablets by mouth daily.   metoprolol succinate (TOPROL-XL) 25 MG 24 hr tablet TAKE 1 TABLET BY MOUTH DAILY   montelukast (SINGULAIR) 10 MG tablet Take 1 tablet (10 mg total) by mouth daily.   Ocrelizumab (OCREVUS IV) Inject into the vein.   rosuvastatin (CRESTOR) 5 MG tablet TAKE 1 TABLET BY MOUTH EVERY MONDAY, WEDNESDAY, FRIDAY.   Tezepelumab-ekko (TEZSPIRE) 210 MG/1.91ML SOAJ Inject 210 mg into the skin every 28 (twenty-eight) days.   valsartan-hydrochlorothiazide (DIOVAN-HCT) 320-25 MG tablet TAKE 1 TABLET BY MOUTH DAILY    Immunization History  Administered Date(s) Administered   Influenza Split 09/26/2012, 09/24/2013   Influenza, High Dose Seasonal PF 09/08/2019   Influenza,inj,Quad PF,6+ Mos 09/14/2020, 11/10/2021   Influenza-Unspecified 08/27/2014, 09/21/2015, 09/20/2016, 09/10/2018, 09/20/2022   PFIZER(Purple Top)SARS-COV-2 Vaccination 02/10/2020, 03/02/2020, 11/16/2020, 03/18/2021   Pfizer Covid-19 Vaccine Bivalent Booster 21yr & up 08/25/2021   Zoster Recombinat (Shingrix) 09/11/2019, 11/12/2019       Objective:   Physical Exam BP 124/78 (BP Location: Left Arm, Cuff Size: Large)   Pulse 67   Temp 97.7 F (36.5 C)   Ht 5' 7.5" (1.715 m)   Wt 213 lb (96.6 kg)   LMP 12/27/2000   SpO2 100%   BMI 32.87 kg/m   SpO2: 100 % O2 Device: None (Room air)  GENERAL: Overweight woman, no acute distress.  Fully ambulatory.  No conversational dyspnea.  No respiratory distress. HEAD: Normocephalic, atraumatic.   EYES: Pupils equal, round, reactive to light.  No scleral icterus. MOUTH: Nose/mouth/throat not examined due to masking requirements for COVID 19. NECK: Supple. No thyromegaly. Trachea midline. No JVD.  No adenopathy. PULMONARY: Good  air entry bilaterally.  No adventitious sounds noted.   CARDIOVASCULAR: S1 and S2. Regular rate and rhythm.  No rubs, murmurs or gallops heard. ABDOMEN: Benign. MUSCULOSKELETAL: No joint deformity, no clubbing, no edema. NEUROLOGIC: No focal deficit, no gait disturbance, speech is fluent. SKIN: Intact,warm,dry.  No rashes. PSYCH: Mood and behavior normal.  Lab Results  Component Value Date   NITRICOXIDE 117 01/04/2023  Previous 181>>133     Assessment & Plan:     ICD-10-CM   1. Severe persistent asthma without complication  J123XX123Nitric oxide   On Tezspire, doing well Continue Breo 200 and Singulair Add AirSupra 2 puffs every 4 as needed This will replace albuterol    2. Multiple sclerosis ([place]  G35    Issue adds complexity to her management She is on OPilgrim's Pridefor 1 week after Ocrevus  infusion    3. Chronic cough  R05.3    Markedly improved on Tezspire     Meds ordered this encounter  Medications   Albuterol-Budesonide (AIRSUPRA) 90-80 MCG/ACT AERO    Sig:  Inhale 2 puffs into the lungs every 4 (four) hours as needed (Shortness of breath, wheezing).    Dispense:  10.7 g    Refill:  6   Will see the patient in follow-up in 3 to 4 months time she is to call sooner should any new problems arise.   Renold Don, MD Advanced Bronchoscopy PCCM [facility]    *This note was dictated using voice recognition software/Dragon.  Despite best efforts to proofread, errors can occur which can change the meaning. Any transcriptional errors that result from this process are unintentional and may not be fully corrected at the time of dictation.

## 2023-01-04 NOTE — Patient Instructions (Signed)
We are substituting albuterol for a medication called AirSupra which is albuterol with budesonide.  You can use this 2 puffs White 4 hours as needed for shortness of breath, chest tightness or wheezing.  Your level of inflammation in the airway continues to decline which is very good.  This means that the Jackie White is doing its job.  We will see you in follow-up in 3 to 4 months time call sooner should any new problems arise.

## 2023-01-18 ENCOUNTER — Other Ambulatory Visit (INDEPENDENT_AMBULATORY_CARE_PROVIDER_SITE_OTHER): Payer: BC Managed Care – PPO

## 2023-01-18 DIAGNOSIS — J8283 Eosinophilic asthma: Secondary | ICD-10-CM | POA: Diagnosis not present

## 2023-01-18 DIAGNOSIS — I10 Essential (primary) hypertension: Secondary | ICD-10-CM | POA: Diagnosis not present

## 2023-01-18 DIAGNOSIS — E7800 Pure hypercholesterolemia, unspecified: Secondary | ICD-10-CM | POA: Diagnosis not present

## 2023-01-18 LAB — HEPATIC FUNCTION PANEL
ALT: 12 U/L (ref 0–35)
AST: 12 U/L (ref 0–37)
Albumin: 3.7 g/dL (ref 3.5–5.2)
Alkaline Phosphatase: 68 U/L (ref 39–117)
Bilirubin, Direct: 0.1 mg/dL (ref 0.0–0.3)
Total Bilirubin: 0.5 mg/dL (ref 0.2–1.2)
Total Protein: 5.7 g/dL — ABNORMAL LOW (ref 6.0–8.3)

## 2023-01-18 LAB — BASIC METABOLIC PANEL
BUN: 13 mg/dL (ref 6–23)
CO2: 31 mEq/L (ref 19–32)
Calcium: 9.3 mg/dL (ref 8.4–10.5)
Chloride: 103 mEq/L (ref 96–112)
Creatinine, Ser: 0.85 mg/dL (ref 0.40–1.20)
GFR: 72.26 mL/min (ref >60.00)
Glucose, Bld: 83 mg/dL (ref 70–99)
Potassium: 3.7 mEq/L (ref 3.5–5.1)
Sodium: 142 mEq/L (ref 135–145)

## 2023-01-18 LAB — CBC WITH DIFFERENTIAL/PLATELET
Basophils Absolute: 0.1 10*3/uL (ref 0.0–0.1)
Basophils Relative: 1 % (ref 0.0–3.0)
Eosinophils Absolute: 0.3 10*3/uL (ref 0.0–0.7)
Eosinophils Relative: 5.3 % — ABNORMAL HIGH (ref 0.0–5.0)
HCT: 41.9 % (ref 36.0–46.0)
Hemoglobin: 13.5 g/dL (ref 12.0–15.0)
Lymphocytes Relative: 41.1 % (ref 12.0–46.0)
Lymphs Abs: 2.6 10*3/uL (ref 0.7–4.0)
MCHC: 32.3 g/dL (ref 30.0–36.0)
MCV: 88.6 fl (ref 78.0–100.0)
Monocytes Absolute: 0.7 10*3/uL (ref 0.1–1.0)
Monocytes Relative: 10.8 % (ref 3.0–12.0)
Neutro Abs: 2.6 10*3/uL (ref 1.4–7.7)
Neutrophils Relative %: 41.8 % — ABNORMAL LOW (ref 43.0–77.0)
Platelets: 301 10*3/uL (ref 150.0–400.0)
RBC: 4.73 Mil/uL (ref 3.87–5.11)
RDW: 14.1 % (ref 11.5–15.5)
WBC: 6.3 10*3/uL (ref 4.0–10.5)

## 2023-01-18 LAB — LIPID PANEL
Cholesterol: 154 mg/dL (ref 0–200)
HDL: 80.3 mg/dL (ref >39.00)
LDL Cholesterol: 64 mg/dL (ref 0–99)
NonHDL: 73.74
Total CHOL/HDL Ratio: 2
Triglycerides: 49 mg/dL (ref 0.0–149.0)
VLDL: 9.8 mg/dL (ref 0.0–40.0)

## 2023-01-18 LAB — TSH: TSH: 2.22 u[IU]/mL (ref 0.35–5.50)

## 2023-01-22 ENCOUNTER — Encounter: Payer: Self-pay | Admitting: Internal Medicine

## 2023-01-22 ENCOUNTER — Ambulatory Visit: Payer: BC Managed Care – PPO | Admitting: Internal Medicine

## 2023-01-22 VITALS — BP 126/74 | HR 66 | Temp 97.9°F | Resp 16 | Ht 67.0 in | Wt 212.6 lb

## 2023-01-22 DIAGNOSIS — E7800 Pure hypercholesterolemia, unspecified: Secondary | ICD-10-CM | POA: Diagnosis not present

## 2023-01-22 DIAGNOSIS — I700 Atherosclerosis of aorta: Secondary | ICD-10-CM | POA: Diagnosis not present

## 2023-01-22 DIAGNOSIS — Z860100 Personal history of colon polyps, unspecified: Secondary | ICD-10-CM

## 2023-01-22 DIAGNOSIS — J8283 Eosinophilic asthma: Secondary | ICD-10-CM

## 2023-01-22 DIAGNOSIS — K219 Gastro-esophageal reflux disease without esophagitis: Secondary | ICD-10-CM

## 2023-01-22 DIAGNOSIS — J45991 Cough variant asthma: Secondary | ICD-10-CM

## 2023-01-22 DIAGNOSIS — I10 Essential (primary) hypertension: Secondary | ICD-10-CM | POA: Diagnosis not present

## 2023-01-22 DIAGNOSIS — G35 Multiple sclerosis: Secondary | ICD-10-CM

## 2023-01-22 DIAGNOSIS — G35D Multiple sclerosis, unspecified: Secondary | ICD-10-CM

## 2023-01-22 DIAGNOSIS — D649 Anemia, unspecified: Secondary | ICD-10-CM | POA: Diagnosis not present

## 2023-01-22 MED ORDER — METOPROLOL SUCCINATE ER 25 MG PO TB24: 25.0000 mg | ORAL_TABLET | Freq: Every day | ORAL | 1 refills | Status: DC

## 2023-01-22 MED ORDER — VALSARTAN-HYDROCHLOROTHIAZIDE 320-25 MG PO TABS: 1.0000 | ORAL_TABLET | Freq: Every day | ORAL | 1 refills | Status: DC

## 2023-01-22 NOTE — Progress Notes (Signed)
Subjective:    Patient ID: Jackie White, female    DOB: 12-21-1955, 67 y.o.   MRN: [id number]  Patient here for  Chief Complaint  Patient presents with   Medical Management of Chronic Issues    HPI Here to follow up regarding her hypercholesterolemia, MS and asthma.  She is doing well. Seeing Dr Patsey Berthold.  Doing well on Tezspire.  Breathing better.  Using saline flushes - for post nasal drainage.  Compliant with singulair and Breo.  Has AirSupra to use prn. Followed by neurology for MS.  Appears to be stable on ocrevus.  Last evaluated 01/09/23.  Recommended f/u in 6 months with Dr Moshe Cipro with MRI.  Overall feels good.  No chest pain.  No abdominal pain or bowel change.     Past Medical History:  Diagnosis Date   Abnormal Pap smear    Cervical disc disease    Cervical and lumbar disc disease   COVID-19    02/15/22   GERD (gastroesophageal reflux disease)    History of iron deficiency    Hypertension    Multiple sclerosis ([place])    Personal history of colonic polyps    Reactive airway disease    Vitamin D deficiency    Past Surgical History:  Procedure Laterality Date   BREAST REDUCTION SURGERY     COLONOSCOPY WITH PROPOFOL N/A 03/16/2021   Procedure: COLONOSCOPY WITH PROPOFOL;  Surgeon: Virgel Manifold, MD;  Location: [facility] ENDOSCOPY;  Service: Endoscopy;  Laterality: N/A;   ESOPHAGOGASTRODUODENOSCOPY (EGD) WITH PROPOFOL N/A 03/16/2021   Procedure: ESOPHAGOGASTRODUODENOSCOPY (EGD) WITH PROPOFOL;  Surgeon: Virgel Manifold, MD;  Location: [facility] ENDOSCOPY;  Service: Endoscopy;  Laterality: N/A;   REDUCTION MAMMAPLASTY Bilateral 2002   TOTAL ABDOMINAL HYSTERECTOMY  2003   Right Oophrectomy   Family History  Problem Relation Age of Onset   Stroke Mother    Hypertension Mother    Heart disease Maternal Grandmother    Breast cancer Neg Hx    Social History   Socioeconomic History   Marital status: Married    Spouse name: Not on file   Number of children: Not on  file   Years of education: Not on file   Highest education level: Not on file  Occupational History   Not on file  Tobacco Use   Smoking status: Never   Smokeless tobacco: Never  Vaping Use   Vaping Use: Never used  Substance and Sexual Activity   Alcohol use: Yes    Alcohol/week: 0.0 standard drinks of alcohol   Drug use: No   Sexual activity: Not on file  Other Topics Concern   Not on file  Social History Narrative   Not on file   Social Determinants of Health   Financial Resource Strain: Not on file  Food Insecurity: Not on file  Transportation Needs: Not on file  Physical Activity: Not on file  Stress: Not on file  Social Connections: Not on file     Review of Systems  Constitutional:  Negative for appetite change and unexpected weight change.  HENT:  Positive for postnasal drip.        No increased congestion.   Respiratory:  Negative for chest tightness and shortness of breath.        No significant cough.  Cardiovascular:  Negative for chest pain, palpitations and leg swelling.  Gastrointestinal:  Negative for abdominal pain, diarrhea, nausea and vomiting.  Genitourinary:  Negative for difficulty urinating and dysuria.  Musculoskeletal:  Negative  for joint swelling and myalgias.  Skin:  Negative for color change and rash.  Neurological:  Negative for dizziness and headaches.  Psychiatric/Behavioral:  Negative for agitation and dysphoric mood.        Objective:     BP 126/74   Pulse 66   Temp 97.9 F (36.6 C)   Resp 16   Ht 5\' 7"  (1.702 m)   Wt 212 lb 9.6 oz (96.4 kg)   LMP 12/27/2000   SpO2 97%   BMI 33.30 kg/m  Wt Readings from Last 3 Encounters:  01/22/23 212 lb 9.6 oz (96.4 kg)  01/04/23 213 lb (96.6 kg)  10/18/22 213 lb 9.6 oz (96.9 kg)    Physical Exam Vitals reviewed.  Constitutional:      General: She is not in acute distress.    Appearance: Normal appearance.  HENT:     Head: Normocephalic and atraumatic.     Right Ear: External  ear normal.     Left Ear: External ear normal.  Eyes:     General: No scleral icterus.       Right eye: No discharge.        Left eye: No discharge.     Conjunctiva/sclera: Conjunctivae normal.  Neck:     Thyroid: No thyromegaly.  Cardiovascular:     Rate and Rhythm: Normal rate and regular rhythm.  Pulmonary:     Effort: No respiratory distress.     Breath sounds: Normal breath sounds. No wheezing.  Abdominal:     General: Bowel sounds are normal.     Palpations: Abdomen is soft.     Tenderness: There is no abdominal tenderness.  Musculoskeletal:        General: No swelling or tenderness.     Cervical back: Neck supple. No tenderness.  Lymphadenopathy:     Cervical: No cervical adenopathy.  Skin:    Findings: No erythema or rash.  Neurological:     Mental Status: She is alert.  Psychiatric:        Mood and Affect: Mood normal.        Behavior: Behavior normal.      Outpatient Encounter Medications as of 01/22/2023  Medication Sig   albuterol (VENTOLIN HFA) 108 (90 Base) MCG/ACT inhaler Inhale 2 puffs into the lungs every 6 (six) hours as needed for wheezing or shortness of breath (cough).   Albuterol-Budesonide (AIRSUPRA) 90-80 MCG/ACT AERO Inhale 2 puffs into the lungs every 4 (four) hours as needed (Shortness of breath, wheezing).   BREO ELLIPTA 200-25 MCG/ACT AEPB INHALE 1 PUFF INTO THE LUNGS DAILY   cetirizine (ZYRTEC) 10 MG tablet Take by mouth.   esomeprazole (NEXIUM) 40 MG capsule TAKE 1 CAPSULE(40 MG) BY MOUTH DAILY   Glucosamine-Chondroitin 250-200 MG TABS Take 2 tablets by mouth daily.   metoprolol succinate (TOPROL-XL) 25 MG 24 hr tablet Take 1 tablet (25 mg total) by mouth daily.   montelukast (SINGULAIR) 10 MG tablet Take 1 tablet (10 mg total) by mouth daily.   Ocrelizumab (OCREVUS IV) Inject into the vein.   rosuvastatin (CRESTOR) 5 MG tablet TAKE 1 TABLET BY MOUTH EVERY MONDAY, WEDNESDAY, FRIDAY.   Tezepelumab-ekko (TEZSPIRE) 210 MG/1.91ML SOAJ Inject 210  mg into the skin every 28 (twenty-eight) days.   valsartan-hydrochlorothiazide (DIOVAN-HCT) 320-25 MG tablet Take 1 tablet by mouth daily.   [DISCONTINUED] doxycycline (VIBRA-TABS) 100 MG tablet Take 1 tablet (100 mg total) by mouth 2 (two) times daily.   [DISCONTINUED] metoprolol succinate (TOPROL-XL) 25 MG 24  hr tablet TAKE 1 TABLET BY MOUTH DAILY   [DISCONTINUED] valsartan-hydrochlorothiazide (DIOVAN-HCT) 320-25 MG tablet TAKE 1 TABLET BY MOUTH DAILY   No facility-administered encounter medications on file as of 01/22/2023.     Lab Results  Component Value Date   WBC 6.3 01/18/2023   HGB 13.5 01/18/2023   HCT 41.9 01/18/2023   PLT 301.0 01/18/2023   GLUCOSE 83 01/18/2023   CHOL 154 01/18/2023   TRIG 49.0 01/18/2023   HDL 80.30 01/18/2023   LDLCALC 64 01/18/2023   ALT 12 01/18/2023   AST 12 01/18/2023   NA 142 01/18/2023   K 3.7 01/18/2023   CL 103 01/18/2023   CREATININE 0.85 01/18/2023   BUN 13 01/18/2023   CO2 31 01/18/2023   TSH 2.22 01/18/2023    MM 3D SCREEN BREAST BILATERAL  Result Date: 11/10/2022 CLINICAL DATA:  Screening. EXAM: DIGITAL SCREENING BILATERAL MAMMOGRAM WITH TOMOSYNTHESIS AND CAD TECHNIQUE: Bilateral screening digital craniocaudal and mediolateral oblique mammograms were obtained. Bilateral screening digital breast tomosynthesis was performed. The images were evaluated with computer-aided detection. COMPARISON:  Previous exam(s). ACR Breast Density Category a: The breast tissue is almost entirely fatty. FINDINGS: There are no findings suspicious for malignancy. IMPRESSION: No mammographic evidence of malignancy. A result letter of this screening mammogram will be mailed directly to the patient. RECOMMENDATION: Screening mammogram in one year. (Code:SM-B-01Y) BI-RADS CATEGORY  1: Negative. Electronically Signed   By: Marin Olp M.D.   On: 11/10/2022 10:18       Assessment & Plan:  Essential hypertension Assessment & Plan: Blood pressure has been doing  well.  Continue metoprolol and diovan/hctz.  Follow pressures.  Follow metabolic panel.   Orders: -     Basic metabolic panel; Future  Hypercholesterolemia Assessment & Plan: On crestor.  Low cholesterol diet and exercise.  Follow lipid panel and liver function tests.    Orders: -     Lipid panel; Future -     Hepatic function panel; Future  Aortic atherosclerosis ([place]) Assessment & Plan: Continue crestor.    Anemia, unspecified type Assessment & Plan: Follow cbc.    Cough variant asthma Assessment & Plan:  Seeing Dr Patsey Berthold.  Doing well on Tezspire.  Breathing better.  Using saline flushes - for post nasal drainage.  Compliant with singulair and Breo.  Has AirSupra to use prn.   Orders: -     CBC with Differential/Platelet; Future  Eosinophilic asthma Assessment & Plan: Seeing Dr Patsey Berthold.  Doing well on Tezspire.  Breathing better.  Using saline flushes - for post nasal drainage.  Compliant with singulair and Breo.  Has AirSupra to use prn.   Orders: -     CBC with Differential/Platelet; Future  Gastroesophageal reflux disease, unspecified whether esophagitis present Assessment & Plan: No acid reflux reported.  On nexium. 03/16/21 - reactive gastritis.    History of colon polyps Assessment & Plan: Colonoscopy 03/16/21 - hemorrhoids and diverticulosis.    Multiple sclerosis ([place]) Assessment & Plan:  Followed by neurology for MS.  Appears to be stable on ocrevus.  Last evaluated 01/09/23.  Recommended f/u in 6 months with Dr Moshe Cipro with MRI.    Other orders -     Metoprolol Succinate ER; Take 1 tablet (25 mg total) by mouth daily.  Dispense: 90 tablet; Refill: 1 -     Valsartan-hydroCHLOROthiazide; Take 1 tablet by mouth daily.  Dispense: 90 tablet; Refill: 1     Einar Pheasant, MD

## 2023-01-28 ENCOUNTER — Encounter: Payer: Self-pay | Admitting: Internal Medicine

## 2023-01-28 NOTE — Assessment & Plan Note (Signed)
Followed by neurology for MS.  Appears to be stable on ocrevus.  Last evaluated 01/09/23.  Recommended f/u in 6 months with Dr Moshe Cipro with MRI.

## 2023-01-28 NOTE — Assessment & Plan Note (Signed)
Follow cbc.  

## 2023-01-28 NOTE — Assessment & Plan Note (Signed)
Seeing Dr Patsey Berthold.  Doing well on Tezspire.  Breathing better.  Using saline flushes - for post nasal drainage.  Compliant with singulair and Breo.  Has AirSupra to use prn.

## 2023-01-28 NOTE — Assessment & Plan Note (Signed)
No acid reflux reported.  On nexium. 03/16/21 - reactive gastritis.

## 2023-01-28 NOTE — Assessment & Plan Note (Signed)
On crestor.  Low cholesterol diet and exercise.  Follow lipid panel and liver function tests.   

## 2023-01-28 NOTE — Assessment & Plan Note (Signed)
Colonoscopy 03/16/21 - hemorrhoids and diverticulosis.

## 2023-01-28 NOTE — Assessment & Plan Note (Signed)
Blood pressure has been doing well.  Continue metoprolol and diovan/hctz.  Follow pressures.  Follow metabolic panel.

## 2023-01-28 NOTE — Assessment & Plan Note (Signed)
Continue crestor 

## 2023-02-05 ENCOUNTER — Other Ambulatory Visit: Payer: Self-pay | Admitting: *Deleted

## 2023-02-05 DIAGNOSIS — J4550 Severe persistent asthma, uncomplicated: Secondary | ICD-10-CM

## 2023-02-05 MED ORDER — TEZSPIRE 210 MG/1.91ML ~~LOC~~ SOAJ: 210.0000 mg | SUBCUTANEOUS | 2 refills | Status: DC

## 2023-02-18 ENCOUNTER — Other Ambulatory Visit: Payer: Self-pay | Admitting: Internal Medicine

## 2023-02-22 ENCOUNTER — Encounter: Payer: Self-pay | Admitting: Internal Medicine

## 2023-03-07 ENCOUNTER — Encounter: Payer: Self-pay | Admitting: Internal Medicine

## 2023-03-07 ENCOUNTER — Other Ambulatory Visit: Payer: Self-pay | Admitting: Pulmonary Disease

## 2023-03-07 DIAGNOSIS — J45991 Cough variant asthma: Secondary | ICD-10-CM

## 2023-03-08 ENCOUNTER — Encounter: Payer: Self-pay | Admitting: Internal Medicine

## 2023-03-09 NOTE — Telephone Encounter (Signed)
Noted, will forward to Dr. Lorin Picket as a Lorain Childes

## 2023-03-10 NOTE — Telephone Encounter (Signed)
Noted.  In reviewing her chart, it appears she also called about ankle swelling.  I can work her in Tuesday for evaluation - ankle swelling.  See other result note.

## 2023-03-12 NOTE — Telephone Encounter (Signed)
Patient has been scheduled

## 2023-03-14 ENCOUNTER — Encounter: Payer: Self-pay | Admitting: Pulmonary Disease

## 2023-03-14 NOTE — Telephone Encounter (Signed)
There are no recommendations from the manufacturer on this.  Their recommendation is that if able plan the surgery between the injection cycle.  She could potentially skip  until 1 June if this makes the [facility] team more comfortable.

## 2023-03-15 ENCOUNTER — Ambulatory Visit: Payer: BC Managed Care – PPO | Admitting: Internal Medicine

## 2023-03-15 VITALS — BP 130/78 | HR 70 | Temp 98.0°F | Resp 16 | Ht 67.0 in | Wt 216.0 lb

## 2023-03-15 DIAGNOSIS — D649 Anemia, unspecified: Secondary | ICD-10-CM

## 2023-03-15 DIAGNOSIS — I10 Essential (primary) hypertension: Secondary | ICD-10-CM

## 2023-03-15 DIAGNOSIS — J8283 Eosinophilic asthma: Secondary | ICD-10-CM | POA: Diagnosis not present

## 2023-03-15 DIAGNOSIS — Z01818 Encounter for other preprocedural examination: Secondary | ICD-10-CM

## 2023-03-15 DIAGNOSIS — G35D Multiple sclerosis, unspecified: Secondary | ICD-10-CM

## 2023-03-15 DIAGNOSIS — I700 Atherosclerosis of aorta: Secondary | ICD-10-CM | POA: Diagnosis not present

## 2023-03-15 DIAGNOSIS — G35 Multiple sclerosis: Secondary | ICD-10-CM

## 2023-03-15 DIAGNOSIS — K219 Gastro-esophageal reflux disease without esophagitis: Secondary | ICD-10-CM

## 2023-03-15 DIAGNOSIS — J4550 Severe persistent asthma, uncomplicated: Secondary | ICD-10-CM

## 2023-03-15 DIAGNOSIS — J45991 Cough variant asthma: Secondary | ICD-10-CM

## 2023-03-15 DIAGNOSIS — E7800 Pure hypercholesterolemia, unspecified: Secondary | ICD-10-CM

## 2023-03-15 NOTE — Progress Notes (Signed)
Subjective:    Patient ID: Jackie White, female    DOB: [birth year], 67 y.o.   MRN: [id number]   HPI Here to discuss upcoming surgery.  Has already had pre op evaluation.  Surgery planned for 04/02/23 - knee surgery.  Recently went on a trip.  Noticed some ankle swelling after the trip.  This has resolved now.  No leg swelling.  No chest pain or sob.  Breathing is stable.  No increased cough or congestion.  Overall she feels she is doing well.  Blood pressure is doing well.  Had EKG at pre op.     Past Medical History:  Diagnosis Date   Abnormal Pap smear    Cervical disc disease    Cervical and lumbar disc disease   COVID-19    02/15/22   GERD (gastroesophageal reflux disease)    History of iron deficiency    Hypertension    Multiple sclerosis (HCC)    Personal history of colonic polyps    Reactive airway disease    Vitamin D deficiency    Past Surgical History:  Procedure Laterality Date   BREAST REDUCTION SURGERY     COLONOSCOPY WITH PROPOFOL N/A 03/16/2021   Procedure: COLONOSCOPY WITH PROPOFOL;  Surgeon: Pasty Spillers, MD;  Location: [facility] ENDOSCOPY;  Service: Endoscopy;  Laterality: N/A;   ESOPHAGOGASTRODUODENOSCOPY (EGD) WITH PROPOFOL N/A 03/16/2021   Procedure: ESOPHAGOGASTRODUODENOSCOPY (EGD) WITH PROPOFOL;  Surgeon: Pasty Spillers, MD;  Location: [facility] ENDOSCOPY;  Service: Endoscopy;  Laterality: N/A;   REDUCTION MAMMAPLASTY Bilateral 2002   TOTAL ABDOMINAL HYSTERECTOMY  2003   Right Oophrectomy   Family History  Problem Relation Age of Onset   Stroke Mother    Hypertension Mother    Heart disease Maternal Grandmother    Breast cancer Neg Hx    Social History   Socioeconomic History   Marital status: Married    Spouse name: Not on file   Number of children: Not on file   Years of education: Not on file   Highest education level: Bachelor's degree (e.g., BA, AB, BS)  Occupational History   Not on file  Tobacco Use   Smoking status: Never    Smokeless tobacco: Never  Vaping Use   Vaping Use: Never used  Substance and Sexual Activity   Alcohol use: Yes    Alcohol/week: 0.0 standard drinks of alcohol   Drug use: No   Sexual activity: Not on file  Other Topics Concern   Not on file  Social History Narrative   Not on file   Social Determinants of Health   Financial Resource Strain: Low Risk  (03/12/2023)   Overall Financial Resource Strain (CARDIA)    Difficulty of Paying Living Expenses: Not hard at all  Food Insecurity: No Food Insecurity (03/12/2023)   Hunger Vital Sign    Worried About Running Out of Food in the Last Year: Never true    Ran Out of Food in the Last Year: Never true  Transportation Needs: No Transportation Needs (03/12/2023)   PRAPARE - Administrator, Civil Service (Medical): No    Lack of Transportation (Non-Medical): No  Physical Activity: Insufficiently Active (03/12/2023)   Exercise Vital Sign    Days of Exercise per Week: 3 days    Minutes of Exercise per Session: 30 min  Stress: No Stress Concern Present (03/12/2023)   [employer] of Occupational Health - Occupational Stress Questionnaire    Feeling of Stress : Not at all  Social Connections: Socially Integrated (03/12/2023)   Social Connection and Isolation Panel [NHANES]    Frequency of Communication with Friends and Family: Three times a week    Frequency of Social Gatherings with Friends and Family: Once a week    Attends Religious Services: 1 to 4 times per year    Active Member of [employer] or Organizations: Yes    Attends Engineer, structural: More than 4 times per year    Marital Status: Married     Review of Systems  Constitutional:  Negative for appetite change and unexpected weight change.  HENT:  Negative for congestion and sinus pressure.   Respiratory:  Negative for cough, chest tightness and shortness of breath.   Cardiovascular:  Negative for chest pain and palpitations.  Gastrointestinal:  Negative  for abdominal pain, diarrhea, nausea and vomiting.  Genitourinary:  Negative for difficulty urinating and dysuria.  Musculoskeletal:  Negative for joint swelling and myalgias.  Skin:  Negative for color change and rash.  Neurological:  Negative for dizziness and headaches.  Psychiatric/Behavioral:  Negative for agitation and dysphoric mood.        Objective:     BP 130/78   Pulse 70   Temp 98 F (36.7 C)   Resp 16   Ht 5\' 7"  (1.702 m)   Wt 216 lb (98 kg)   LMP 12/27/2000   SpO2 98%   BMI 33.83 kg/m  Wt Readings from Last 3 Encounters:  03/15/23 216 lb (98 kg)  01/22/23 212 lb 9.6 oz (96.4 kg)  01/04/23 213 lb (96.6 kg)    Physical Exam Vitals reviewed.  Constitutional:      General: She is not in acute distress.    Appearance: Normal appearance.  HENT:     Head: Normocephalic and atraumatic.     Right Ear: External ear normal.     Left Ear: External ear normal.  Eyes:     General: No scleral icterus.       Right eye: No discharge.        Left eye: No discharge.     Conjunctiva/sclera: Conjunctivae normal.  Neck:     Thyroid: No thyromegaly.  Cardiovascular:     Rate and Rhythm: Normal rate and regular rhythm.  Pulmonary:     Effort: No respiratory distress.     Breath sounds: Normal breath sounds. No wheezing.  Abdominal:     General: Bowel sounds are normal.     Palpations: Abdomen is soft.     Tenderness: There is no abdominal tenderness.  Musculoskeletal:        General: No swelling or tenderness.     Cervical back: Neck supple. No tenderness.  Lymphadenopathy:     Cervical: No cervical adenopathy.  Skin:    Findings: No erythema or rash.  Neurological:     Mental Status: She is alert.  Psychiatric:        Mood and Affect: Mood normal.        Behavior: Behavior normal.      Outpatient Encounter Medications as of 03/15/2023  Medication Sig   albuterol (VENTOLIN HFA) 108 (90 Base) MCG/ACT inhaler Inhale 2 puffs into the lungs every 6 (six) hours  as needed for wheezing or shortness of breath (cough).   Albuterol-Budesonide (AIRSUPRA) 90-80 MCG/ACT AERO Inhale 2 puffs into the lungs every 4 (four) hours as needed (Shortness of breath, wheezing).   BREO ELLIPTA 200-25 MCG/ACT AEPB INHALE 1 PUFF INTO THE LUNGS DAILY  cetirizine (ZYRTEC) 10 MG tablet Take by mouth.   esomeprazole (NEXIUM) 40 MG capsule TAKE 1 CAPSULE(40 MG) BY MOUTH DAILY   Glucosamine-Chondroitin 250-200 MG TABS Take 2 tablets by mouth daily.   metoprolol succinate (TOPROL-XL) 25 MG 24 hr tablet Take 1 tablet (25 mg total) by mouth daily.   montelukast (SINGULAIR) 10 MG tablet Take 1 tablet (10 mg total) by mouth daily.   Ocrelizumab (OCREVUS IV) Inject into the vein.   rosuvastatin (CRESTOR) 5 MG tablet TAKE 1 TABLET BY MOUTH EVERY MONDAY, WEDNESDAY, FRIDAY.   Tezepelumab-ekko (TEZSPIRE) 210 MG/1. SOAJ Inject 210 mg into the skin every 28 (twenty-eight) days.   valsartan-hydrochlorothiazide (DIOVAN-HCT) 320-25 MG tablet Take 1 tablet by mouth daily.   No facility-administered encounter medications on file as of 03/15/2023.     Lab Results  Component Value Date   WBC 6.3 01/18/2023   HGB 13.5 01/18/2023   HCT 41.9 01/18/2023   PLT 301.0 01/18/2023   GLUCOSE 83 01/18/2023   CHOL 154 01/18/2023   TRIG 49.0 01/18/2023   HDL 80.30 01/18/2023   LDLCALC 64 01/18/2023   ALT 12 01/18/2023   AST 12 01/18/2023   NA 142 01/18/2023   K 3.7 01/18/2023   CL 103 01/18/2023   CREATININE 0.85 01/18/2023   BUN 13 01/18/2023   CO2 31 01/18/2023   TSH 2.22 01/18/2023    MM 3D SCREEN BREAST BILATERAL  Result Date: 11/10/2022 CLINICAL DATA:  Screening. EXAM: DIGITAL SCREENING BILATERAL MAMMOGRAM WITH TOMOSYNTHESIS AND CAD TECHNIQUE: Bilateral screening digital craniocaudal and mediolateral oblique mammograms were obtained. Bilateral screening digital breast tomosynthesis was performed. The images were evaluated with computer-aided detection. COMPARISON:  Previous exam(s).  ACR Breast Density Category a: The breast tissue is almost entirely fatty. FINDINGS: There are no findings suspicious for malignancy. IMPRESSION: No mammographic evidence of malignancy. A result letter of this screening mammogram will be mailed directly to the patient. RECOMMENDATION: Screening mammogram in one year. (Code:SM-B-01Y) BI-RADS CATEGORY  1: Negative. Electronically Signed   By: Elberta Fortis M.D.   On: 11/10/2022 10:18       Assessment & Plan:  Anemia, unspecified type Assessment & Plan: Hgb 02/14/23 - wnl.    Aortic atherosclerosis (HCC) Assessment & Plan: Continue crestor.    Cough variant asthma Assessment & Plan:  Seeing Dr Jayme Cloud.  Doing well on Tezspire.  Breathing better.  Using saline flushes - for post nasal drainage.  Compliant with singulair and Breo.  Has AirSupra to use prn. Has contacted Dr Jayme Cloud office regarding [name] - prior to surgery.  See note.    Eosinophilic asthma Assessment & Plan: Seeing Dr Jayme Cloud.  Doing well on Tezspire.  Breathing better.  Using saline flushes - for post nasal drainage.  Compliant with singulair and Breo.  Has AirSupra to use prn.    Essential hypertension Assessment & Plan: Blood pressure has been doing well.  Continue metoprolol and diovan/hctz.  Follow pressures.  Follow metabolic panel.    Gastroesophageal reflux disease, unspecified whether esophagitis present Assessment & Plan: No acid reflux reported.  On nexium. EGD -03/16/21 - reactive gastritis.    Hypercholesterolemia Assessment & Plan: On crestor.  Low cholesterol diet and exercise.  Follow lipid panel and liver function tests.     Multiple sclerosis (H[facility]) Assessment & Plan:  Followed by neurology for MS.  Appears to be stable on ocrevus.  Last evaluated 01/09/23.  Recommended f/u in 6 months with Dr Elwyn Reach with MRI. Will have her f/u with  neurology - any recs prior to surgery.     Severe persistent asthma, unspecified whether  complicated Assessment & Plan: Dr Jayme Cloud - Continue Tezspire. Continue Singulair and Breo Ellipta   Pre-op evaluation Assessment & Plan: Reports has already had her pre op evaluation at [facility]. Note reviewed.  They have assessed her to be at a low cardiac risk.  She is without chest pain or sob.  No ankle or leg swelling.  Breathing better.  Has discussed with Dr Jayme Cloud [name].  Per review of pre op note, they recommended her f/u with neurology regarding dosing.        Dale [place], MD

## 2023-03-18 ENCOUNTER — Encounter: Payer: Self-pay | Admitting: Internal Medicine

## 2023-03-18 ENCOUNTER — Telehealth: Payer: Self-pay | Admitting: Internal Medicine

## 2023-03-18 DIAGNOSIS — Z01818 Encounter for other preprocedural examination: Secondary | ICD-10-CM | POA: Insufficient documentation

## 2023-03-18 NOTE — Assessment & Plan Note (Signed)
Followed by neurology for MS.  Appears to be stable on ocrevus.  Last evaluated 01/09/23.  Recommended f/u in 6 months with Dr Elwyn Reach with MRI. Will have her f/u with neurology - any recs prior to surgery.

## 2023-03-18 NOTE — Assessment & Plan Note (Signed)
Hgb 02/14/23 - wnl.

## 2023-03-18 NOTE — Assessment & Plan Note (Signed)
Dr Jayme Cloud - Continue Tezspire. Continue Singulair and Breo Ellipta

## 2023-03-18 NOTE — Assessment & Plan Note (Signed)
Reports has already had her pre op evaluation at [facility]. Note reviewed.  They have assessed her to be at a low cardiac risk.  She is without chest pain or sob.  No ankle or leg swelling.  Breathing better.  Has discussed with Dr Jayme Cloud [name].  Per review of pre op note, they recommended her f/u with neurology regarding dosing.

## 2023-03-18 NOTE — Assessment & Plan Note (Signed)
Seeing Dr Gonzalez.  Doing well on Tezspire.  Breathing better.  Using saline flushes - for post nasal drainage.  Compliant with singulair and Breo.  Has AirSupra to use prn.  

## 2023-03-18 NOTE — Assessment & Plan Note (Signed)
Seeing Dr Jayme Cloud.  Doing well on Tezspire.  Breathing better.  Using saline flushes - for post nasal drainage.  Compliant with singulair and Breo.  Has AirSupra to use prn. Has contacted Dr Jayme Cloud office regarding [name] - prior to surgery.  See note.

## 2023-03-18 NOTE — Assessment & Plan Note (Signed)
Blood pressure has been doing well.  Continue metoprolol and diovan/hctz.  Follow pressures.  Follow metabolic panel.  

## 2023-03-18 NOTE — Assessment & Plan Note (Signed)
On crestor.  Low cholesterol diet and exercise.  Follow lipid panel and liver function tests.   

## 2023-03-18 NOTE — Assessment & Plan Note (Signed)
No acid reflux reported.  On nexium. EGD -03/16/21 - reactive gastritis.

## 2023-03-18 NOTE — Telephone Encounter (Signed)
She had an EKG at [facility] - for pre op.  Need copy faxed to [place] asap.  I can see EKG was done, cannot see report.  Surgery is planned for 04/02/23.  Also, notify Ms Sharrow that in reviewing the pre op note from [facility], they recommended her contact her neurologist to make them aware of her surgery and to give recommendations regarding her medication dosing, etc.  Let us know if a problem.

## 2023-03-18 NOTE — Assessment & Plan Note (Signed)
Continue crestor 

## 2023-03-19 NOTE — Telephone Encounter (Signed)
Called patient to let her know to call neurology. Called [facility] and requested EKG be faxed to [place].

## 2023-03-20 NOTE — Telephone Encounter (Signed)
EKG received and placed in folder for you

## 2023-03-21 HISTORY — PX: REPLACEMENT TOTAL KNEE: SUR1224

## 2023-03-21 NOTE — Telephone Encounter (Signed)
Reviewed and reviewed her pre op evaluation at [facility].

## 2023-04-09 ENCOUNTER — Telehealth: Payer: Self-pay

## 2023-04-09 ENCOUNTER — Encounter: Payer: Self-pay | Admitting: Internal Medicine

## 2023-04-09 NOTE — Telephone Encounter (Signed)
Patient has been scheduled with you for a virtual to discuss Friday at 4:00.

## 2023-04-09 NOTE — Telephone Encounter (Signed)
Please call her and touch base with her.  Let her know that I am not in the office today.

## 2023-04-09 NOTE — Transitions of Care (Post Inpatient/ED Visit) (Signed)
04/09/2023  Name: Jackie White MRN: [id number] DOB: 05/06/1956  Today's TOC FU Call Status: Today's TOC FU Call Status:: Successful TOC FU Call Competed TOC FU Call Complete Date: 04/09/23  Transition Care Management Follow-up Telephone Call Date of Discharge: 04/07/23 Discharge Facility: Other ([facility]) Name of Other ([facility]) Discharge Facility: [facility] Type of Discharge: Inpatient Admission Primary Inpatient Discharge Diagnosis:: palpitations How have you been since you were released from the hospital?: Better Any questions or concerns?: No  Items Reviewed: Did you receive and understand the discharge instructions provided?: Yes Medications obtained,verified, and reconciled?: Yes (Medications Reviewed) Any new allergies since your discharge?: No Dietary orders reviewed?: Yes Do you have support at home?: Yes People in Home: significant other  Medications Reviewed Today: Medications Reviewed Today     Reviewed by Karena Addison, LPN (Licensed Practical Nurse) on 04/09/23 at 1033  Med List Status: <None>   Medication Order Taking? Sig Documenting Provider Last Dose Status Informant  albuterol (VENTOLIN HFA) 108 (90 Base) MCG/ACT inhaler [id number] Yes Inhale 2 puffs into the lungs every 6 (six) hours as needed for wheezing or shortness of breath (cough). Salena Saner, MD Taking Active   Albuterol-Budesonide [facility]) 90-80 MCG/ACT Sandrea Matte [id number] Yes Inhale 2 puffs into the lungs every 4 (four) hours as needed (Shortness of breath, wheezing). Salena Saner, MD Taking Active   apixaban (ELIQUIS) 5 MG TABS tablet [id number] Yes Take 5 mg by mouth 2 (two) times daily. [provider]  Active   Earlie Server 200-25 MCG/ACT AEPB [id number] Yes INHALE 1 PUFF INTO THE LUNGS DAILY Salena Saner, MD Taking Active   cetirizine (ZYRTEC) 10 MG tablet [id number] Yes Take by mouth. [provider] Taking Active   esomeprazole (NEXIUM) 40 MG capsule  [id number] Yes TAKE 1 CAPSULE(40 MG) BY MOUTH DAILY Dale Cubero, MD Taking Active   Glucosamine-Chondroitin 250-200 MG TABS [id number] Yes Take 2 tablets by mouth daily. [provider] Taking Active   metoprolol succinate (TOPROL-XL) 25 MG 24 hr tablet [id number] Yes Take 1 tablet (25 mg total) by mouth daily. Dale [place], MD Taking Active   montelukast (SINGULAIR) 10 MG tablet [id number] Yes Take 1 tablet (10 mg total) by mouth daily. Salena Saner, MD Taking Active   Ocrelizumab (OCREVUS IV) [id number] Yes Inject into the vein. [provider] Taking Active   rosuvastatin (CRESTOR) 5 MG tablet [id number] Yes TAKE 1 TABLET BY MOUTH EVERY MONDAY, WEDNESDAY, FRIDAY. Dale [place], MD Taking Active   Tezepelumab-ekko (TEZSPIRE) 210 MG/1. SOAJ [id number] Yes Inject 210 mg into the skin every 28 (twenty-eight) days. Salena Saner, MD Taking Active   valsartan-hydrochlorothiazide (DIOVAN-HCT) 320-25 MG tablet [id number] Yes Take 1 tablet by mouth daily. Dale [place], MD Taking Active             Home Care and Equipment/Supplies: Were Home Health Services Ordered?: NA Any new equipment or medical supplies ordered?: NA  Functional Questionnaire: Do you need assistance with bathing/showering or dressing?: No Do you need assistance with meal preparation?: No Do you need assistance with eating?: No Do you have difficulty maintaining continence: No Do you need assistance with getting out of bed/getting out of a chair/moving?: No Do you have difficulty managing or taking your medications?: No  Follow up appointments reviewed: PCP Follow-up appointment confirmed?: No (patient is waiting for return call from office for appt time) MD Provider Line Number:[phone] Given: No Specialist Hospital Follow-up appointment confirmed?: NA Do you need transportation to your follow-up appointment?:  No Do you understand care options if your condition(s) worsen?:  Yes-patient verbalized understanding    SIGNATURE Karena Addison, LPN [facility] Nurse Health Advisor Direct Dial [phone]

## 2023-04-09 NOTE — Transitions of Care (Post Inpatient/ED Visit) (Deleted)
   04/09/2023  Name: Jackie White MRN: [id number] DOB: 1956-08-14  {[id number]}

## 2023-04-10 NOTE — Telephone Encounter (Signed)
See other note - regarding scheduling f/u appt - hospital follow up

## 2023-04-10 NOTE — Telephone Encounter (Signed)
I did let patient know that PCP was out of office yesterday. Pt was ok with this. Offered patient a couple different appt times to discuss and pt chose Friday at 4. Advised if any cancellations on tues/thurs (days she requested first) I would call her to move appt up.

## 2023-04-10 NOTE — Telephone Encounter (Signed)
Appt for Friday changed to virtual HFU

## 2023-04-13 ENCOUNTER — Telehealth: Payer: BC Managed Care – PPO | Admitting: Internal Medicine

## 2023-04-13 ENCOUNTER — Encounter: Payer: Self-pay | Admitting: Internal Medicine

## 2023-04-13 VITALS — BP 128/75 | HR 74 | Ht 67.0 in | Wt 210.0 lb

## 2023-04-13 DIAGNOSIS — J45991 Cough variant asthma: Secondary | ICD-10-CM | POA: Diagnosis not present

## 2023-04-13 DIAGNOSIS — G35 Multiple sclerosis: Secondary | ICD-10-CM

## 2023-04-13 DIAGNOSIS — I2699 Other pulmonary embolism without acute cor pulmonale: Secondary | ICD-10-CM

## 2023-04-13 DIAGNOSIS — K219 Gastro-esophageal reflux disease without esophagitis: Secondary | ICD-10-CM

## 2023-04-13 DIAGNOSIS — G35D Multiple sclerosis, unspecified: Secondary | ICD-10-CM

## 2023-04-13 DIAGNOSIS — Z9889 Other specified postprocedural states: Secondary | ICD-10-CM

## 2023-04-13 DIAGNOSIS — I10 Essential (primary) hypertension: Secondary | ICD-10-CM

## 2023-04-13 DIAGNOSIS — I4892 Unspecified atrial flutter: Secondary | ICD-10-CM | POA: Diagnosis not present

## 2023-04-13 DIAGNOSIS — I700 Atherosclerosis of aorta: Secondary | ICD-10-CM | POA: Diagnosis not present

## 2023-04-13 MED ORDER — ONDANSETRON HCL 4 MG PO TABS: 4.0000 mg | ORAL_TABLET | Freq: Two times a day (BID) | ORAL | 0 refills | Status: DC | PRN

## 2023-04-13 NOTE — Progress Notes (Unsigned)
Subjective:    Patient ID: Jackie White, female    DOB: 27-Feb-1956, 67 y.o.   MRN: [id number]  Patient here for  Chief Complaint  Patient presents with   Hospitalization Follow-up    HPI    Past Medical History:  Diagnosis Date   Abnormal Pap smear    Cervical disc disease    Cervical and lumbar disc disease   COVID-19    02/15/22   GERD (gastroesophageal reflux disease)    History of iron deficiency    Hypertension    Multiple sclerosis (HCC)    Personal history of colonic polyps    Reactive airway disease    Vitamin D deficiency    Past Surgical History:  Procedure Laterality Date   BREAST REDUCTION SURGERY     COLONOSCOPY WITH PROPOFOL N/A 03/16/2021   Procedure: COLONOSCOPY WITH PROPOFOL;  Surgeon: Pasty Spillers, MD;  Location: [facility] ENDOSCOPY;  Service: Endoscopy;  Laterality: N/A;   ESOPHAGOGASTRODUODENOSCOPY (EGD) WITH PROPOFOL N/A 03/16/2021   Procedure: ESOPHAGOGASTRODUODENOSCOPY (EGD) WITH PROPOFOL;  Surgeon: Pasty Spillers, MD;  Location: [facility] ENDOSCOPY;  Service: Endoscopy;  Laterality: N/A;   REDUCTION MAMMAPLASTY Bilateral 2002   TOTAL ABDOMINAL HYSTERECTOMY  2003   Right Oophrectomy   Family History  Problem Relation Age of Onset   Stroke Mother    Hypertension Mother    Heart disease Maternal Grandmother    Breast cancer Neg Hx    Social History   Socioeconomic History   Marital status: Married    Spouse name: Not on file   Number of children: Not on file   Years of education: Not on file   Highest education level: Bachelor's degree (e.g., BA, AB, BS)  Occupational History   Not on file  Tobacco Use   Smoking status: Never   Smokeless tobacco: Never  Vaping Use   Vaping Use: Never used  Substance and Sexual Activity   Alcohol use: Yes    Alcohol/week: 0.0 standard drinks of alcohol   Drug use: No   Sexual activity: Not on file  Other Topics Concern   Not on file  Social History Narrative   Not on file   Social  Determinants of Health   Financial Resource Strain: Low Risk  (03/12/2023)   Overall Financial Resource Strain (CARDIA)    Difficulty of Paying Living Expenses: Not hard at all  Food Insecurity: No Food Insecurity (03/12/2023)   Hunger Vital Sign    Worried About Running Out of Food in the Last Year: Never true    Ran Out of Food in the Last Year: Never true  Transportation Needs: No Transportation Needs (03/12/2023)   PRAPARE - Administrator, Civil Service (Medical): No    Lack of Transportation (Non-Medical): No  Physical Activity: Insufficiently Active (03/12/2023)   Exercise Vital Sign    Days of Exercise per Week: 3 days    Minutes of Exercise per Session: 30 min  Stress: No Stress Concern Present (03/12/2023)   [employer] of Occupational Health - Occupational Stress Questionnaire    Feeling of Stress : Not at all  Social Connections: Socially Integrated (03/12/2023)   Social Connection and Isolation Panel [NHANES]    Frequency of Communication with Friends and Family: Three times a week    Frequency of Social Gatherings with Friends and Family: Once a week    Attends Religious Services: 1 to 4 times per year    Active Member of [employer] or Organizations: Yes  Attends Banker Meetings: More than 4 times per year    Marital Status: Married     Review of Systems     Objective:     BP 128/75   Pulse 74   Ht 5\' 7"  (1.702 m)   Wt 210 lb (95.3 kg)   LMP 12/27/2000   SpO2 99%   BMI 32.89 kg/m  Wt Readings from Last 3 Encounters:  04/13/23 210 lb (95.3 kg)  03/15/23 216 lb (98 kg)  01/22/23 212 lb 9.6 oz (96.4 kg)    Physical Exam   Outpatient Encounter Medications as of 04/13/2023  Medication Sig   albuterol (VENTOLIN HFA) 108 (90 Base) MCG/ACT inhaler Inhale 2 puffs into the lungs every 6 (six) hours as needed for wheezing or shortness of breath (cough).   Albuterol-Budesonide (AIRSUPRA) 90-80 MCG/ACT AERO Inhale 2 puffs into the lungs  every 4 (four) hours as needed (Shortness of breath, wheezing).   apixaban (ELIQUIS) 5 MG TABS tablet Take 5 mg by mouth 2 (two) times daily.   BREO ELLIPTA 200-25 MCG/ACT AEPB INHALE 1 PUFF INTO THE LUNGS DAILY   cetirizine (ZYRTEC) 10 MG tablet Take by mouth.   esomeprazole (NEXIUM) 40 MG capsule TAKE 1 CAPSULE(40 MG) BY MOUTH DAILY   metoprolol succinate (TOPROL-XL) 25 MG 24 hr tablet Take 1 tablet (25 mg total) by mouth daily.   Ocrelizumab (OCREVUS IV) Inject into the vein.   rosuvastatin (CRESTOR) 5 MG tablet TAKE 1 TABLET BY MOUTH EVERY MONDAY, WEDNESDAY, FRIDAY.   Tezepelumab-ekko (TEZSPIRE) 210 MG/1. SOAJ Inject 210 mg into the skin every 28 (twenty-eight) days.   valsartan-hydrochlorothiazide (DIOVAN-HCT) 320-25 MG tablet Take 1 tablet by mouth daily.   [DISCONTINUED] Glucosamine-Chondroitin 250-200 MG TABS Take 2 tablets by mouth daily. (Patient not taking: Reported on 04/13/2023)   [DISCONTINUED] montelukast (SINGULAIR) 10 MG tablet Take 1 tablet (10 mg total) by mouth daily. (Patient not taking: Reported on 04/13/2023)   No facility-administered encounter medications on file as of 04/13/2023.     Lab Results  Component Value Date   WBC 6.3 01/18/2023   HGB 13.5 01/18/2023   HCT 41.9 01/18/2023   PLT 301.0 01/18/2023   GLUCOSE 83 01/18/2023   CHOL 154 01/18/2023   TRIG 49.0 01/18/2023   HDL 80.30 01/18/2023   LDLCALC 64 01/18/2023   ALT 12 01/18/2023   AST 12 01/18/2023   NA 142 01/18/2023   K 3.7 01/18/2023   CL 103 01/18/2023   CREATININE 0.85 01/18/2023   BUN 13 01/18/2023   CO2 31 01/18/2023   TSH 2.22 01/18/2023    MM 3D SCREEN BREAST BILATERAL  Result Date: 11/10/2022 CLINICAL DATA:  Screening. EXAM: DIGITAL SCREENING BILATERAL MAMMOGRAM WITH TOMOSYNTHESIS AND CAD TECHNIQUE: Bilateral screening digital craniocaudal and mediolateral oblique mammograms were obtained. Bilateral screening digital breast tomosynthesis was performed. The images were evaluated  with computer-aided detection. COMPARISON:  Previous exam(s). ACR Breast Density Category a: The breast tissue is almost entirely fatty. FINDINGS: There are no findings suspicious for malignancy. IMPRESSION: No mammographic evidence of malignancy. A result letter of this screening mammogram will be mailed directly to the patient. RECOMMENDATION: Screening mammogram in one year. (Code:SM-B-01Y) BI-RADS CATEGORY  1: Negative. Electronically Signed   By: Elberta Fortis M.D.   On: 11/10/2022 10:18       Assessment & Plan:  There are no diagnoses linked to this encounter.   Dale Campo, MD

## 2023-04-15 ENCOUNTER — Encounter: Payer: Self-pay | Admitting: Internal Medicine

## 2023-04-15 ENCOUNTER — Telehealth: Payer: Self-pay | Admitting: Internal Medicine

## 2023-04-15 DIAGNOSIS — Z9889 Other specified postprocedural states: Secondary | ICD-10-CM | POA: Insufficient documentation

## 2023-04-15 DIAGNOSIS — I4892 Unspecified atrial flutter: Secondary | ICD-10-CM | POA: Insufficient documentation

## 2023-04-15 DIAGNOSIS — I2699 Other pulmonary embolism without acute cor pulmonale: Secondary | ICD-10-CM | POA: Insufficient documentation

## 2023-04-15 NOTE — Assessment & Plan Note (Signed)
No acid reflux reported.  On nexium. EGD -03/16/21 - reactive gastritis. Some nausea reported.  Rx for zofran.

## 2023-04-15 NOTE — Progress Notes (Addendum)
Patient ID: Jackie White, female   DOB: Apr 08, 1956, 67 y.o.   MRN: [id number]   Virtual Visit via video Note   All issues noted in this document were discussed and addressed.  No physical exam was performed (except for noted visual exam findings with Video Visits).   I connected with Faigy Hibben by a video enabled telemedicine application and verified that I am speaking with the correct person using two identifiers. Location patient: home Location provider: work or Persons participating in the virtual visit: patient, provider  The limitations, risks, security and privacy concerns of performing an evaluation and management service by video and the availability of in person appointments have been discussed.  It has also been discussed with the patient that there may be a patient responsible charge related to this service. The patient expressed understanding and agreed to proceed.   Reason for visit: hospital follow up.   HPI: Is s/p right knee surgery 04/02/23. Discharged 04/03/23 with home health PT. Was readmitted 04/06/23 - 04/07/23 - after presenting with sob and palpitations (heart rate 120-140). Per record review, was found to have new atrial flutter and subsegmental PE.  Was started on eliquis.  Atrial flutter resolved spontaneously during her hospitalization.  Ruled out troponins. Potassium 3.4.  normal magnesium. TSH wnl. Lower extremity ultrasound - no clot. CT - positive for LLL subsegmental pulmonary embolus. She was discharged with heart monitor and plans for cardiology referral. Since her discharge, she has been doing relatively well.  Working with PT.  Walking.  Taking tylenol scheduled for pain. Bowels are ok now.  She is having some nausea and request something for this.  She is eating.    ROS: See pertinent positives and negatives per HPI.  Past Medical History:  Diagnosis Date   Abnormal Pap smear    Cervical disc disease    Cervical and lumbar disc disease   COVID-19     02/15/22   GERD (gastroesophageal reflux disease)    History of iron deficiency    Hypertension    Multiple sclerosis (HCC)    Personal history of colonic polyps    Reactive airway disease    Vitamin D deficiency     Past Surgical History:  Procedure Laterality Date   BREAST REDUCTION SURGERY     COLONOSCOPY WITH PROPOFOL N/A 03/16/2021   Procedure: COLONOSCOPY WITH PROPOFOL;  Surgeon: Pasty Spillers, MD;  Location: [facility] ENDOSCOPY;  Service: Endoscopy;  Laterality: N/A;   ESOPHAGOGASTRODUODENOSCOPY (EGD) WITH PROPOFOL N/A 03/16/2021   Procedure: ESOPHAGOGASTRODUODENOSCOPY (EGD) WITH PROPOFOL;  Surgeon: Pasty Spillers, MD;  Location: [facility] ENDOSCOPY;  Service: Endoscopy;  Laterality: N/A;   REDUCTION MAMMAPLASTY Bilateral 2002   TOTAL ABDOMINAL HYSTERECTOMY  2003   Right Oophrectomy    Family History  Problem Relation Age of Onset   Stroke Mother    Hypertension Mother    Heart disease Maternal Grandmother    Breast cancer Neg Hx     SOCIAL HX: reviewed.    Current Outpatient Medications:    albuterol (VENTOLIN HFA) 108 (90 Base) MCG/ACT inhaler, Inhale 2 puffs into the lungs every 6 (six) hours as needed for wheezing or shortness of breath (cough)., Disp: 18 g, Rfl: 6   Albuterol-Budesonide (AIRSUPRA) 90-80 MCG/ACT AERO, Inhale 2 puffs into the lungs every 4 (four) hours as needed (Shortness of breath, wheezing)., Disp: 10.7 g, Rfl: 6   apixaban (ELIQUIS) 5 MG TABS tablet, Take 5 mg by mouth 2 (two) times daily., Disp: , Rfl:  BREO ELLIPTA 200-25 MCG/ACT AEPB, INHALE 1 PUFF INTO THE LUNGS DAILY, Disp: 60 each, Rfl: 2   cetirizine (ZYRTEC) 10 MG tablet, Take by mouth., Disp: , Rfl:    esomeprazole (NEXIUM) 40 MG capsule, TAKE 1 CAPSULE(40 MG) BY MOUTH DAILY, Disp: 90 capsule, Rfl: 1   metoprolol succinate (TOPROL-XL) 25 MG 24 hr tablet, Take 1 tablet (25 mg total) by mouth daily., Disp: 90 tablet, Rfl: 1   Ocrelizumab (OCREVUS IV), Inject into the vein., Disp: ,  Rfl:    ondansetron (ZOFRAN) 4 MG tablet, Take 1 tablet (4 mg total) by mouth 2 (two) times daily as needed for nausea or vomiting., Disp: 20 tablet, Rfl: 0   rosuvastatin (CRESTOR) 5 MG tablet, TAKE 1 TABLET BY MOUTH EVERY MONDAY, WEDNESDAY, FRIDAY., Disp: 39 tablet, Rfl: 2   Tezepelumab-ekko (TEZSPIRE) 210 MG/1. SOAJ, Inject 210 mg into the skin every 28 (twenty-eight) days., Disp: 1.91 mL, Rfl: 2   valsartan-hydrochlorothiazide (DIOVAN-HCT) 320-25 MG tablet, Take 1 tablet by mouth daily., Disp: 90 tablet, Rfl: 1  EXAM:  VITALS per patient if applicable: 128/75, 99%, pulse 74  GENERAL: alert, oriented, appears well and in no acute distress  HEENT: atraumatic, conjunttiva clear, no obvious abnormalities on inspection of external nose and ears  NECK: normal movements of the head and neck  LUNGS: on inspection no signs of respiratory distress, breathing rate appears normal, no obvious gross SOB, gasping or wheezing  CV: no obvious cyanosis  PSYCH/NEURO: pleasant and cooperative, no obvious depression or anxiety, speech and thought processing grossly intact  ASSESSMENT AND PLAN:  Discussed the following assessment and plan:  Problem List Items Addressed This Visit     Pulmonary embolus (HCC)    Admitted 04/06/23 - after presenting with sob and palpitations (heart rate 120-140). Per record review, was found to have new atrial flutter and subsegmental PE.  Was started on eliquis.  Atrial flutter resolved spontaneously during her hospitalization.  Ruled out troponins. Potassium 3.4.  normal magnesium. TSH wnl. Lower extremity ultrasound - no clot. CT - positive for LLL subsegmental pulmonary embolus. She was discharged with heart monitor and plans for cardiology referral. Discussed eliquis.  PE occurred in the post op setting.  Discussed further hematology evaluation.  Since discharge - breathing stable.        Relevant Orders   Ambulatory referral to Hematology / Oncology   Multiple  sclerosis [facility])    Followed by neurology for MS.  Appears to be stable on ocrevus.  Last evaluated 01/09/23.  Recommended f/u in 6 months with Dr Elwyn Reach with MRI.      History of right knee surgery - Primary    S/p right knee surgery 04/02/23.  Working with home PT.  Followed by ortho.        GERD (gastroesophageal reflux disease)    No acid reflux reported.  On nexium. EGD -03/16/21 - reactive gastritis. Some nausea reported.  Rx for zofran.       Relevant Medications   ondansetron (ZOFRAN) 4 MG tablet   Essential hypertension    Continue metoprolol and diovan/hctz.  Follow pressures.  Follow metabolic panel.       Cough variant asthma    Has been doing well on Tezspire.  Breathing better. Compliant with singulair and Breo.  Has AirSupra to use prn.       Atrial flutter (HCC)    During recent hospitalization was found to be in aflutter.  Aflutter resolved spontaneously.  CT - positive for  LLL subsegmental pulmonary embolus. She was discharged with heart monitor (scheduled to wear until 04/22/23) and plans for cardiology referral. Discussed.  She is agreeable to cardiology referral.  Reports breathing stable.  Denies any increased heart rate or palpitations currently.        Relevant Orders   Ambulatory referral to Cardiology   Aortic atherosclerosis (HCC)    Continue crestor.        No follow-ups on file.   I discussed the assessment and treatment plan with the patient. The patient was provided an opportunity to ask questions and all were answered. The patient agreed with the plan and demonstrated an understanding of the instructions.   The patient was advised to call back or seek an in-person evaluation if the symptoms worsen or if the condition fails to improve as anticipated.   Dale [place], MD

## 2023-04-15 NOTE — Assessment & Plan Note (Signed)
Followed by neurology for MS.  Appears to be stable on ocrevus.  Last evaluated 01/09/23.  Recommended f/u in 6 months with Dr Skeen with MRI.  

## 2023-04-15 NOTE — Assessment & Plan Note (Signed)
Admitted 04/06/23 - after presenting with sob and palpitations (heart rate 120-140). Per record review, was found to have new atrial flutter and subsegmental PE.  Was started on eliquis.  Atrial flutter resolved spontaneously during her hospitalization.  Ruled out troponins. Potassium 3.4.  normal magnesium. TSH wnl. Lower extremity ultrasound - no clot. CT - positive for LLL subsegmental pulmonary embolus. She was discharged with heart monitor and plans for cardiology referral. Discussed eliquis.  PE occurred in the post op setting.  Discussed further hematology evaluation.  Since discharge - breathing stable.

## 2023-04-15 NOTE — Assessment & Plan Note (Signed)
During recent hospitalization was found to be in aflutter.  Aflutter resolved spontaneously.  CT - positive for LLL subsegmental pulmonary embolus. She was discharged with heart monitor (scheduled to wear until 04/22/23) and plans for cardiology referral. Discussed.  She is agreeable to cardiology referral.  Reports breathing stable.  Denies any increased heart rate or palpitations currently.

## 2023-04-15 NOTE — Assessment & Plan Note (Signed)
Continue crestor 

## 2023-04-15 NOTE — Assessment & Plan Note (Signed)
S/p right knee surgery 04/02/23.  Working with home PT.  Followed by ortho.

## 2023-04-15 NOTE — Telephone Encounter (Signed)
Please notify Jackie White that I have placed the referral to cardiology for f/u new onset aflutter during her recent hospitalization.  She previously saw Dr Mariah Milling, so I placed referral for f/u with him.  I also placed order for referral to hematology - to evaluate duration of eliquis. Someone should be contacting her with an appt date and time.

## 2023-04-15 NOTE — Assessment & Plan Note (Addendum)
Continue metoprolol and diovan/hctz.  Follow pressures.  Follow metabolic panel.

## 2023-04-15 NOTE — Assessment & Plan Note (Signed)
Has been doing well on Tezspire.  Breathing better. Compliant with singulair and Breo.  Has AirSupra to use prn.

## 2023-04-15 NOTE — Addendum Note (Signed)
Addended by: Charm Barges on: 04/15/2023 07:21 AM   Modules accepted: Orders

## 2023-04-17 NOTE — Telephone Encounter (Signed)
Patient aware of below.

## 2023-04-19 ENCOUNTER — Encounter: Payer: Self-pay | Admitting: Internal Medicine

## 2023-04-19 ENCOUNTER — Inpatient Hospital Stay: Payer: BC Managed Care – PPO

## 2023-04-19 ENCOUNTER — Inpatient Hospital Stay: Payer: BC Managed Care – PPO | Attending: Internal Medicine | Admitting: Internal Medicine

## 2023-04-19 VITALS — BP 121/58 | HR 60 | Temp 97.8°F | Wt 214.0 lb

## 2023-04-19 DIAGNOSIS — I2699 Other pulmonary embolism without acute cor pulmonale: Secondary | ICD-10-CM

## 2023-04-19 DIAGNOSIS — Z7901 Long term (current) use of anticoagulants: Secondary | ICD-10-CM | POA: Diagnosis not present

## 2023-04-19 DIAGNOSIS — G35 Multiple sclerosis: Secondary | ICD-10-CM | POA: Diagnosis not present

## 2023-04-19 DIAGNOSIS — Z823 Family history of stroke: Secondary | ICD-10-CM | POA: Diagnosis not present

## 2023-04-19 DIAGNOSIS — I2693 Single subsegmental pulmonary embolism without acute cor pulmonale: Secondary | ICD-10-CM | POA: Diagnosis present

## 2023-04-19 DIAGNOSIS — Z8249 Family history of ischemic heart disease and other diseases of the circulatory system: Secondary | ICD-10-CM | POA: Diagnosis not present

## 2023-04-19 DIAGNOSIS — I10 Essential (primary) hypertension: Secondary | ICD-10-CM | POA: Diagnosis not present

## 2023-04-19 NOTE — Progress Notes (Signed)
[facility]  [phone] [phone]  ID: Jackie White OB: [birth year]  MR#: [id number]  CSN#:[id number]  Patient Care Team: Dale [place], MD as PCP - General (Internal Medicine) Salena Saner, MD as Consulting Physician (Pulmonary Disease)  REFERRING PROVIDER: Dr. Dale   REASON FOR REFERRAL: Provoked acute PE  HPI: Jackie White is a 67 y.o. female with past medical history of multiple sclerosis, iron deficiency, hypertension was referred to hematology for anticoagulation management of acute provoked PE in setting of recent right knee surgery.  Patient had knee replacement done on 04/02/2023.  On 04/06/2023, she developed palpitations and went to [facility] ER.  She was found to be in atrial flutter. CTA with and without contrast showed filling defect within the left lower lobe subsegmental pulmonary artery compatible with pulmonary emboli.  Echo was normal.  Venous Doppler did not show any DVT.  She is on Eliquis and tolerating well.  Denies any prior personal history or family history of blood clots.  Wearing Holter monitor for heart rhythm monitoring.  She finished in-house PT today and will soon be starting outpatient PT.  Denies any shortness of breath, bleeding in urine or stools.  REVIEW OF SYSTEMS:   ROS  As per HPI. Otherwise, a complete review of systems is negative.  PAST MEDICAL HISTORY: Past Medical History:  Diagnosis Date   Abnormal Pap smear    Cervical disc disease    Cervical and lumbar disc disease   COVID-19    02/15/22   GERD (gastroesophageal reflux disease)    History of iron deficiency    Hypertension    Multiple sclerosis (HCC)    Personal history of colonic polyps    Reactive airway disease    Vitamin D deficiency     PAST SURGICAL HISTORY: Past Surgical History:  Procedure Laterality Date   BREAST REDUCTION SURGERY     COLONOSCOPY WITH PROPOFOL N/A 03/16/2021   Procedure: COLONOSCOPY  WITH PROPOFOL;  Surgeon: Pasty Spillers, MD;  Location: [facility] ENDOSCOPY;  Service: Endoscopy;  Laterality: N/A;   ESOPHAGOGASTRODUODENOSCOPY (EGD) WITH PROPOFOL N/A 03/16/2021   Procedure: ESOPHAGOGASTRODUODENOSCOPY (EGD) WITH PROPOFOL;  Surgeon: Pasty Spillers, MD;  Location: [facility] ENDOSCOPY;  Service: Endoscopy;  Laterality: N/A;   REDUCTION MAMMAPLASTY Bilateral 2002   TOTAL ABDOMINAL HYSTERECTOMY  2003   Right Oophrectomy    FAMILY HISTORY: Family History  Problem Relation Age of Onset   Stroke Mother    Hypertension Mother    Heart disease Maternal Grandmother    Breast cancer Neg Hx     HEALTH MAINTENANCE: Social History   Tobacco Use   Smoking status: Never   Smokeless tobacco: Never  Vaping Use   Vaping Use: Never used  Substance Use Topics   Alcohol use: Yes    Alcohol/week: 0.0 standard drinks of alcohol   Drug use: No     No Known Allergies  Current Outpatient Medications  Medication Sig Dispense Refill   albuterol (VENTOLIN HFA) 108 (90 Base) MCG/ACT inhaler Inhale 2 puffs into the lungs every 6 (six) hours as needed for wheezing or shortness of breath (cough). 18 g 6   Albuterol-Budesonide (AIRSUPRA) 90-80 MCG/ACT AERO Inhale 2 puffs into the lungs every 4 (four) hours as needed (Shortness of breath, wheezing). 10.7 g 6   apixaban (ELIQUIS) 5 MG TABS tablet Take 5 mg by mouth 2 (two) times daily.     BREO ELLIPTA 200-25 MCG/ACT AEPB INHALE 1 PUFF INTO THE LUNGS DAILY  60 each 2   cetirizine (ZYRTEC) 10 MG tablet Take by mouth.     esomeprazole (NEXIUM) 40 MG capsule TAKE 1 CAPSULE(40 MG) BY MOUTH DAILY 90 capsule 1   metoprolol succinate (TOPROL-XL) 25 MG 24 hr tablet Take 1 tablet (25 mg total) by mouth daily. 90 tablet 1   Ocrelizumab (OCREVUS IV) Inject into the vein.     ondansetron (ZOFRAN) 4 MG tablet Take 1 tablet (4 mg total) by mouth 2 (two) times daily as needed for nausea or vomiting. 20 tablet 0   rosuvastatin (CRESTOR) 5 MG tablet TAKE 1  TABLET BY MOUTH EVERY MONDAY, WEDNESDAY, FRIDAY. 39 tablet 2   Tezepelumab-ekko (TEZSPIRE) 210 MG/1. SOAJ Inject 210 mg into the skin every 28 (twenty-eight) days. 1.91 mL 2   valsartan-hydrochlorothiazide (DIOVAN-HCT) 320-25 MG tablet Take 1 tablet by mouth daily. 90 tablet 1   No current facility-administered medications for this visit.    OBJECTIVE: Vitals:   04/19/23 1417  BP: (!) 121/58  Pulse: 60  Temp: 97.8 F (36.6 C)  SpO2: 100%     Body mass index is 33.52 kg/m.      General: Well-developed, well-nourished, no acute distress. Eyes: Pink conjunctiva, anicteric sclera. HEENT: Normocephalic, moist mucous membranes, clear oropharnyx. Lungs: Clear to auscultation bilaterally. Heart: Regular rate and rhythm. No rubs, murmurs, or gallops. Abdomen: Soft, nontender, nondistended. No organomegaly noted, normoactive bowel sounds. Musculoskeletal: Right knee and leg swelling.  Recent surgery. Neuro: Alert, answering all questions appropriately. Cranial nerves grossly intact. Skin: No rashes or petechiae noted. Psych: Normal affect. Lymphatics: No cervical, calvicular, axillary or inguinal LAD.   LAB RESULTS:  Lab Results  Component Value Date   NA 142 01/18/2023   K 3.7 01/18/2023   CL 103 01/18/2023   CO2 31 01/18/2023   GLUCOSE 83 01/18/2023   BUN 13 01/18/2023   CREATININE 0.85 01/18/2023   CALCIUM 9.3 01/18/2023   PROT 5.7 (L) 01/18/2023   ALBUMIN 3.7 01/18/2023   AST 12 01/18/2023   ALT 12 01/18/2023   ALKPHOS 68 01/18/2023   BILITOT 0.5 01/18/2023   GFRNONAA >60 03/01/2016   GFRAA >60 03/01/2016    Lab Results  Component Value Date   WBC 6.3 01/18/2023   NEUTROABS 2.6 01/18/2023   HGB 13.5 01/18/2023   HCT 41.9 01/18/2023   MCV 88.6 01/18/2023   PLT 301.0 01/18/2023    Lab Results  Component Value Date   FERRITIN 34.7 12/05/2017     STUDIES: No results found.  ASSESSMENT AND PLAN:   Jackie White is a 67 y.o. female with pmh of past  medical history of multiple sclerosis, iron deficiency, hypertension was referred to hematology for anticoagulation management of acute provoked PE in setting of recent right knee surgery.  # Provoked acute left lobe subsegmental PE -In setting of recent right knee surgery.  She was diagnosed on 04/06/2023.  She is on Eliquis 5 mg twice daily and tolerating well.  -I discussed with the patient and her wife about the diagnosis of blood clot which can sometimes happen in setting of surgery.  Considering this was a provoked event, 3 months of anticoagulation is reasonable.  Completion date on 07/08/2023.   - She does not need any hypercoagulable workup.  There is no prior personal or family history of blood clot.  Patient was made aware about monitoring for any bleeding in urine or stool while on anticoagulation and should inform the provider.  Also if she has any falls  with hitting head she needs to go to ED.  Also made aware about symptoms such as leg swelling with calf pain and shortness of breath concerning for any recurrence of the blood clot although risk is low.  -Patient will continue to follow with her primary Dr. Lorin Picket.  She will follow-up with me as needed.  RTC as needed  Patient expressed understanding and was in agreement with this plan. She also understands that She can call clinic at any time with any questions, concerns, or complaints.   I spent a total of 45 minutes reviewing chart data, face-to-face evaluation with the patient, counseling and coordination of care as detailed above.  Michaelyn Barter, MD   04/19/2023 2:42 PM

## 2023-04-19 NOTE — Patient Instructions (Signed)
Take Eliquis 5 mg twice a day for total 3 months. Completion date is August 18th, 2024. You can completely stop it after that.

## 2023-04-19 NOTE — Progress Notes (Signed)
Thank you for seeing her.  I appreciate it.  Dale Marland

## 2023-05-01 ENCOUNTER — Telehealth: Payer: Self-pay

## 2023-05-01 NOTE — Telephone Encounter (Signed)
Holter monitor results received via fax. Placed in your medical records folder for your review/

## 2023-05-01 NOTE — Telephone Encounter (Signed)
Was recently admitted.  Monitor placed at discharge and recommended f/u with cardiology.  Please confirm with ms L[place]hal that she is doing ok.  Any increased heart rate or palpitations.  Has anyone called her regarding monitor results, cardiology f/u , etc.

## 2023-05-02 NOTE — Telephone Encounter (Signed)
Confirmed with pt that she is doing ok. No acute issues. She does have a cardiology appt 7/16 with Dr Ethelene Hal NP. Has not heard from them. Patient stated that when she was being discharged from hospital, she was advised that zio results would go to you. Advised that typically these monitors are read by a cardiologist

## 2023-05-05 ENCOUNTER — Encounter: Payer: Self-pay | Admitting: Internal Medicine

## 2023-05-07 NOTE — Telephone Encounter (Signed)
Ok to refill eliquis.  Make sure she has f/u appt scheduled with me.

## 2023-05-07 NOTE — Telephone Encounter (Signed)
This was filled by historical provider. Are you ok with filling her eliquis?

## 2023-05-07 NOTE — Telephone Encounter (Signed)
error 

## 2023-05-08 MED ORDER — APIXABAN 5 MG PO TABS: 5.0000 mg | ORAL_TABLET | Freq: Two times a day (BID) | ORAL | 2 refills | Status: DC

## 2023-05-08 NOTE — Telephone Encounter (Signed)
Eliquis sent to pharmacy. LM for patient. She does have f/u with Dr Lorin Picket in a couple of months and has f/u with cardiology per chart

## 2023-05-11 NOTE — Telephone Encounter (Signed)
Please call and confirm she is doing ok.  I did receive her monitor results.  Had some short runs of increased heart rate, but no afib noticed.  Please schedule her a follow up appt with me for f/u and recheck.

## 2023-05-11 NOTE — Telephone Encounter (Signed)
Patient is doing well & notified of results. She  has appt with cardioloy on July 16 & CPE with you in September.   Please advise if she needs to be seen sooner. If so, she would be okay to schedule.

## 2023-05-11 NOTE — Telephone Encounter (Signed)
If not having any increased heart rate or palpitations or any symptoms/problems, ok to hold on scheduling earlier appt.  Keep appt with cardiology. Please forward monitor results to cardiology.  If any problems, let us know.

## 2023-05-14 NOTE — Telephone Encounter (Signed)
Spoke to pt and she confirmed that she is not having any symptoms at present time and she will keep appt with cardiology will call if symptoms return before appt with cardiology

## 2023-05-28 ENCOUNTER — Other Ambulatory Visit: Payer: Self-pay | Admitting: Pulmonary Disease

## 2023-05-28 DIAGNOSIS — J45991 Cough variant asthma: Secondary | ICD-10-CM

## 2023-06-01 ENCOUNTER — Encounter: Payer: Self-pay | Admitting: Pulmonary Disease

## 2023-06-01 ENCOUNTER — Telehealth: Payer: Self-pay | Admitting: Pharmacist

## 2023-06-01 DIAGNOSIS — J4550 Severe persistent asthma, uncomplicated: Secondary | ICD-10-CM

## 2023-06-01 NOTE — Telephone Encounter (Signed)
Patient has recently enrolled into Medicare. Submitted a Prior Authorization request to [facility] for TEZSPIRE via CoverMyMeds. Will update once we receive a response.  Key: [id number]  Chesley Mires, PharmD, MPH, BCPS, CPP Clinical Pharmacist (Rheumatology and Pulmonology)

## 2023-06-05 ENCOUNTER — Other Ambulatory Visit
Admission: RE | Admit: 2023-06-05 | Discharge: 2023-06-05 | Disposition: A | Discharge status: Home / Self Care | Payer: Medicare PPO | Attending: Medical | Admitting: Medical

## 2023-06-05 ENCOUNTER — Encounter: Payer: Self-pay | Admitting: Medical

## 2023-06-05 ENCOUNTER — Ambulatory Visit: Payer: Medicare PPO | Attending: Medical | Admitting: Medical

## 2023-06-05 VITALS — BP 130/84 | HR 71 | Ht 67.0 in | Wt 216.0 lb

## 2023-06-05 DIAGNOSIS — I2699 Other pulmonary embolism without acute cor pulmonale: Secondary | ICD-10-CM

## 2023-06-05 DIAGNOSIS — I10 Essential (primary) hypertension: Secondary | ICD-10-CM | POA: Diagnosis present

## 2023-06-05 DIAGNOSIS — J8283 Eosinophilic asthma: Secondary | ICD-10-CM | POA: Diagnosis present

## 2023-06-05 DIAGNOSIS — Q2112 Patent foramen ovale: Secondary | ICD-10-CM | POA: Diagnosis not present

## 2023-06-05 DIAGNOSIS — J45991 Cough variant asthma: Secondary | ICD-10-CM | POA: Insufficient documentation

## 2023-06-05 DIAGNOSIS — I471 Supraventricular tachycardia, unspecified: Secondary | ICD-10-CM | POA: Diagnosis not present

## 2023-06-05 DIAGNOSIS — E7800 Pure hypercholesterolemia, unspecified: Secondary | ICD-10-CM | POA: Diagnosis present

## 2023-06-05 LAB — CBC WITH DIFFERENTIAL/PLATELET
Abs Immature Granulocytes: 0.02 10*3/uL (ref 0.00–0.07)
Basophils Absolute: 0 10*3/uL (ref 0.0–0.1)
Basophils Relative: 1 %
Eosinophils Absolute: 0.3 10*3/uL (ref 0.0–0.5)
Eosinophils Relative: 5 %
HCT: 39.2 % (ref 36.0–46.0)
Hemoglobin: 12.7 g/dL (ref 12.0–15.0)
Immature Granulocytes: 0 %
Lymphocytes Relative: 43 %
Lymphs Abs: 2.8 10*3/uL (ref 0.7–4.0)
MCH: 28.1 pg (ref 26.0–34.0)
MCHC: 32.4 g/dL (ref 30.0–36.0)
MCV: 86.7 fL (ref 80.0–100.0)
Monocytes Absolute: 0.7 10*3/uL (ref 0.1–1.0)
Monocytes Relative: 10 %
Neutro Abs: 2.7 10*3/uL (ref 1.7–7.7)
Neutrophils Relative %: 41 %
Platelets: 354 10*3/uL (ref 150–400)
RBC: 4.52 MIL/uL (ref 3.87–5.11)
RDW: 14.9 % (ref 11.5–15.5)
WBC: 6.5 10*3/uL (ref 4.0–10.5)
nRBC: 0 % (ref 0.0–0.2)

## 2023-06-05 LAB — BASIC METABOLIC PANEL
Anion gap: 10 (ref 5–15)
BUN: 12 mg/dL (ref 8–23)
CO2: 26 mmol/L (ref 22–32)
Calcium: 9.6 mg/dL (ref 8.9–10.3)
Chloride: 102 mmol/L (ref 98–111)
Creatinine, Ser: 0.73 mg/dL (ref 0.44–1.00)
GFR, Estimated: >60 mL/min (ref >60)
Glucose, Bld: 100 mg/dL — ABNORMAL HIGH (ref 70–99)
Potassium: 4.3 mmol/L (ref 3.5–5.1)
Sodium: 138 mmol/L (ref 135–145)

## 2023-06-05 LAB — HEPATIC FUNCTION PANEL
ALT: 17 U/L (ref 0–44)
AST: 18 U/L (ref 15–41)
Albumin: 4.3 g/dL (ref 3.5–5.0)
Alkaline Phosphatase: 86 U/L (ref 38–126)
Bilirubin, Direct: 0.1 mg/dL (ref 0.0–0.2)
Indirect Bilirubin: 0.7 mg/dL (ref 0.3–0.9)
Total Bilirubin: 0.8 mg/dL (ref 0.3–1.2)
Total Protein: 7.3 g/dL (ref 6.5–8.1)

## 2023-06-05 LAB — LIPID PANEL
Cholesterol: 177 mg/dL (ref 0–200)
HDL: 97 mg/dL (ref >40)
LDL Cholesterol: 69 mg/dL (ref 0–99)
Total CHOL/HDL Ratio: 1.8 RATIO
Triglycerides: 57 mg/dL (ref <150)
VLDL: 11 mg/dL (ref 0–40)

## 2023-06-05 NOTE — Progress Notes (Unsigned)
Cardiology Office Note:    Date:  06/05/2023   ID:  Jackie White, DOB 01-26-1956, MRN [id number]  PCP:  Dale [place], MD  [facility] Cardiologist:  None  [facility] Electrophysiologist:  None   Referring MD: Dale Gladwin, MD   Chief Complaint: Overdue follow-up  History of Present Illness:    Jackie White is a 67 y.o. female with a hx of MS, anemia, GERD, Coronary calcium score of 0, and Acute provoked PE 03/2023 (in the setting or knee replacement) who presents for follow-up.   Echo in 2020 showed normal LVEF, G2DD, mildly elevated right heart pressures, mildly dilated left atrium. Coronary calcium score was 0.   The patient was last seen in 2022 and was stable from a cardiac perspective.    The patient underwent right knee replacement May 2024 and had subsequent Acute PE, started on Eliquis. Echo May 2024 LVEF, normal diastolic function, normal EVSF, trivial valve disease, positive saline study after valsalva suggestive of patent foramen ovale.Heart monitor 03/2023 showed NSR, average HR 74bpm, 48 runs of SVT fastest 162bpm, longest 11.4 seconds, 5 patient triggers, <1% SVE. DVT study was negative. She was referred to oncology, who felt hypercoagulable work-up was not needed since it was provoked PE.  IT was recommended she follow-up with cardiologist.  Today, the patient reports she reported she had right knee replacement in May and shortly after was diagnosed with Acute PE. She was placed on Eliquis. She is done with Eliquis in August. She denies chest pain, shortness of breath. No lower leg edema. She is still doing PT. DVT study was negative. Echo and heart monitor were reviewed. EKG today shows NSR.   Past Medical History:  Diagnosis Date   Abnormal Pap smear    Cervical disc disease    Cervical and lumbar disc disease   COVID-19    02/15/22   GERD (gastroesophageal reflux disease)    History of iron deficiency    Hypertension    Multiple sclerosis (HCC)     Personal history of colonic polyps    Reactive airway disease    Vitamin D deficiency     Past Surgical History:  Procedure Laterality Date   BREAST REDUCTION SURGERY     COLONOSCOPY WITH PROPOFOL N/A 03/16/2021   Procedure: COLONOSCOPY WITH PROPOFOL;  Surgeon: Pasty Spillers, MD;  Location: [facility] ENDOSCOPY;  Service: Endoscopy;  Laterality: N/A;   ESOPHAGOGASTRODUODENOSCOPY (EGD) WITH PROPOFOL N/A 03/16/2021   Procedure: ESOPHAGOGASTRODUODENOSCOPY (EGD) WITH PROPOFOL;  Surgeon: Pasty Spillers, MD;  Location: [facility] ENDOSCOPY;  Service: Endoscopy;  Laterality: N/A;   REDUCTION MAMMAPLASTY Bilateral 2002   TOTAL ABDOMINAL HYSTERECTOMY  2003   Right Oophrectomy    Current Medications: Current Meds  Medication Sig   albuterol (VENTOLIN HFA) 108 (90 Base) MCG/ACT inhaler Inhale 2 puffs into the lungs every 6 (six) hours as needed for wheezing or shortness of breath (cough).   Albuterol-Budesonide (AIRSUPRA) 90-80 MCG/ACT AERO Inhale 2 puffs into the lungs every 4 (four) hours as needed (Shortness of breath, wheezing).   apixaban (ELIQUIS) 5 MG TABS tablet Take 1 tablet (5 mg total) by mouth 2 (two) times daily.   BREO ELLIPTA 200-25 MCG/ACT AEPB INHALE 1 PUFF INTO THE LUNGS DAILY   cetirizine (ZYRTEC) 10 MG tablet Take by mouth.   esomeprazole (NEXIUM) 40 MG capsule TAKE 1 CAPSULE(40 MG) BY MOUTH DAILY   metoprolol succinate (TOPROL-XL) 25 MG 24 hr tablet Take 1 tablet (25 mg total) by mouth daily.  Ocrelizumab (OCREVUS IV) Inject into the vein.   rosuvastatin (CRESTOR) 5 MG tablet TAKE 1 TABLET BY MOUTH EVERY MONDAY, WEDNESDAY, FRIDAY.   Tezepelumab-ekko (TEZSPIRE) 210 MG/1. SOAJ Inject 210 mg into the skin every 28 (twenty-eight) days.   valsartan-hydrochlorothiazide (DIOVAN-HCT) 320-25 MG tablet Take 1 tablet by mouth daily.     Allergies:   Patient has no known allergies.   Social History   Socioeconomic History   Marital status: Married    Spouse name: Not on  file   Number of children: Not on file   Years of education: Not on file   Highest education level: Bachelor's degree (e.g., BA, AB, BS)  Occupational History   Not on file  Tobacco Use   Smoking status: Never   Smokeless tobacco: Never  Vaping Use   Vaping status: Never Used  Substance and Sexual Activity   Alcohol use: Yes    Alcohol/week: 0.0 standard drinks of alcohol   Drug use: No   Sexual activity: Not on file  Other Topics Concern   Not on file  Social History Narrative   Not on file   Social Determinants of Health   Financial Resource Strain: Low Risk  (04/07/2023)   Received from [facility], [facility]   Overall Financial Resource Strain (CARDIA)    Difficulty of Paying Living Expenses: Not hard at all  Food Insecurity: No Food Insecurity (04/19/2023)   Hunger Vital Sign    Worried About Running Out of Food in the Last Year: Never true    Ran Out of Food in the Last Year: Never true  Transportation Needs: No Transportation Needs (04/19/2023)   PRAPARE - Administrator, Civil Service (Medical): No    Lack of Transportation (Non-Medical): No  Physical Activity: Insufficiently Active (03/12/2023)   Exercise Vital Sign    Days of Exercise per Week: 3 days    Minutes of Exercise per Session: 30 min  Stress: No Stress Concern Present (03/12/2023)   [employer] of Occupational Health - Occupational Stress Questionnaire    Feeling of Stress : Not at all  Social Connections: Socially Integrated (03/12/2023)   Social Connection and Isolation Panel [NHANES]    Frequency of Communication with Friends and Family: Three times a week    Frequency of Social Gatherings with Friends and Family: Once a week    Attends Religious Services: 1 to 4 times per year    Active Member of [employer] or Organizations: Yes    Attends Engineer, structural: More than 4 times per year    Marital Status: Married     Family History: The  patient's family history includes Heart disease in her maternal grandmother; Hypertension in her mother; Stroke in her mother. There is no history of Breast cancer.  ROS:   Please see the history of present illness.     All other systems reviewed and are negative.  EKGs/Labs/Other Studies Reviewed:    The following studies were reviewed today:  Heart monitor  *The predominant rhythm was sinus. *The Maximum Heart Rate recorded was 162 bpm, 05/18 21:17:03, the Minimum Heart Rate recorded was 54 bpm, 05/20 23:35:29, and the Average Heart Rate was 74 bpm. *There were 102 VE beats with a burden of <1 %. *There were 3,315 SVE beats with a burden of <1 %. There were 48 occurrences of Supraventricular Tachycardia with the Fastest episode 162 bpm, 05/18 21:17:03, and the Longest episode 11.4s, 05/30 22:02:25. *There  were 5 Patient Triggers. Narrative  This result has an attachment that is not available. *The predominant rhythm was sinus. *The Maximum Heart Rate recorded was 162 bpm, 05/18 21:17:03, the Minimum Heart Rate recorded was 54 bpm, 05/20 23:35:29, and the Average Heart Rate was 74 bpm. *There were 102 VE beats with a burden of <1 %. *There were 3,315 SVE beats with a burden of <1 %. There were 48 occurrences of Supraventricular Tachycardia with the Fastest episode 162 bpm, 05/18 21:17:03, and the Longest episode 11.4s, 05/30 22:02:25. *There were 5 Patient Triggers. Procedure Note  Wynne Dust, MD - 04/26/2023 Formatting of this note might be different from the original. *The predominant rhythm was sinus. *The Maximum Heart Rate recorded was 162 bpm, 05/18 21:17:03, the Minimum Heart Rate recorded was 54 bpm, 05/20 23:35:29, and the Average Heart Rate was 74 bpm. *There were 102 VE beats with a burden of <1 %. *There were 3,315 SVE beats with a burden of <1 %. There were 48 occurrences of Supraventricular Tachycardia with the Fastest episode 162 bpm, 05/18 21:17:03, and the  Longest episode 11.4s, 05/30 22:02:25. *There were 5 Patient Triggers. IMPRESSION: *The predominant rhythm was sinus. *The Maximum Heart Rate recorded was 162 bpm, 05/18 21:17:03, the Minimum Heart Rate recorded was 54 bpm, 05/20 23:35:29, and the Average Heart Rate was 74 bpm. *There were 102 VE beats with a burden of <1 %. *There were 3,315 SVE beats with a burden of <1 %. There were 48 occurrences of Supraventricular Tachycardia with the Fastest episode 162 bpm, 05/18 21:17:03, and the Longest episode 11.4s, 05/30 22:02:25. *There were 5 Patient Triggers.  Echo 03/2023  INTERPRETATION ---------------------------------------------------------------    NORMAL LEFT VENTRICULAR FUNCTION WITH MILD LVH    NORMAL LA PRESSURES WITH NORMAL DIASTOLIC FUNCTION    NORMAL RIGHT VENTRICULAR SYSTOLIC FUNCTION    VALVULAR REGURGITATION: TRIVIAL MR, TRIVIAL PR, MILD TR    NO VALVULAR STENOSIS    POSITIVE SALINE MICROCAVITATION STUDY AFTER VALSALVA, SUGGESTIVE OF PATENT    FORAMEN OVALE    LV MID-CAVITARY GRADIENT OF 2 mmHg AT REST AND 22 mmHg WITH VALSALVA.    NO PRIOR STUDY FOR COMPARISON   Echo 2020 1. The left ventricle has normal systolic function with an ejection  fraction of 60-65%. The cavity size was normal. Left ventricular diastolic  Doppler parameters are consistent with pseudonormalization.   2. The right ventricle has normal systolic function. The cavity was  normal. There is no increase in right ventricular wall thickness. Right  ventricular systolic pressure is mildly elevated with an estimated  pressure of 41.9 mmHg.   3. Left atrial size was mildly dilated.   4. Tricuspid valve regurgitation is moderate.    EKG:  EKG is ordered today.  The ekg ordered today demonstrates NSR 71bpm, TWI III  Recent Labs: 01/18/2023: ALT 12; BUN 13; Creatinine, Ser 0.85; Hemoglobin 13.5; Platelets 301.0; Potassium 3.7; Sodium 142; TSH 2.22  Recent Lipid Panel    Component Value Date/Time   CHOL 154  01/18/2023 0858   TRIG 49.0 01/18/2023 0858   HDL 80.30 01/18/2023 0858   CHOLHDL 2 01/18/2023 0858   VLDL 9.8 01/18/2023 0858   LDLCALC 64 01/18/2023 0858    Physical Exam:    VS:  BP 130/84 (BP Location: Left Arm, Patient Position: Sitting, Cuff Size: Normal)   Pulse 71   Ht 5\' 7"  (1.702 m)   Wt 216 lb (98 kg)   LMP 12/27/2000   SpO2 97%  BMI 33.83 kg/m     Wt Readings from Last 3 Encounters:  06/05/23 216 lb (98 kg)  04/19/23 214 lb (97.1 kg)  04/13/23 210 lb (95.3 kg)     GEN:  Well nourished, well developed in no acute distress HEENT: Normal NECK: No JVD; No carotid bruits LYMPHATICS: No lymphadenopathy CARDIAC: RRR, no murmurs, rubs, gallops RESPIRATORY:  Clear to auscultation without rales, wheezing or rhonchi  ABDOMEN: Soft, non-tender, non-distended MUSCULOSKELETAL:  No edema; No deformity  SKIN: Warm and dry NEUROLOGIC:  Alert and oriented x 3 PSYCHIATRIC:  Normal affect   ASSESSMENT:    1. Other acute pulmonary embolism, unspecified whether acute cor pulmonale present (HCC)   2. PSVT (paroxysmal supraventricular tachycardia)   3. Patent foramen ovale    PLAN:    In order of problems listed above:  Provoked Acute left lobe subsegmental PE Diagnosed with acute PE in May 2024 following right knee replacement surgery started on Eliquis. DVT study was negative. Heart monitor showed NSR with pSVT, no afib. Echo showed normal LVEF, normal diastolic function, normal EVSF, trivial valve disease, positive saline study after valsalva suggestive of patent foramen ovale. Continue Eliquis 5mg  BID, she will complete treatment in August.  Check a CBC today.   pSVT 48 runs, longest 11 seconds on recent heart monitor. She denies significant palpitations. Continue Toprol 25mg  daily.   Patent foramen ovale Echo at [facility] showed possible patent foram ovale as above.This was not seen on echo in 2020. Will discuss with  further work-up with MD. She is on Eliquis.    Disposition: Follow up in 3 month(s) with MD    Signed, Cynithia Hakimi David Stall, PA-C  06/05/2023 3:54 PM    [facility]

## 2023-06-05 NOTE — Patient Instructions (Addendum)
Medication Instructions:  Your physician recommends that you continue on your current medications as directed. Please refer to the Current Medication list given to you today.  *If you need a refill on your cardiac medications before your next appointment, please call your pharmacy*   Lab Work: Your provider would like for you to have following labs drawn: CBC.   Please go to the [facility] entrance and check in at the front desk.  You do not need an appointment.  They are open from 7am-6 pm.   If you have labs (blood work) drawn today and your tests are completely normal, you will receive your results only by: MyChart Message (if you have MyChart) OR A paper copy in the mail If you have any lab test that is abnormal or we need to change your treatment, we will call you to review the results.   Testing/Procedures: none   Follow-Up: At [facility], you and your health needs are our priority.  As part of our continuing mission to provide you with exceptional heart care, we have created designated Provider Care Teams.  These Care Teams include your primary Cardiologist (physician) and Advanced Practice Providers (APPs -  Physician Assistants and Nurse Practitioners) who all work together to provide you with the care you need, when you need it.  We recommend signing up for the patient portal called "MyChart".  Sign up information is provided on this After Visit Summary.  MyChart is used to connect with patients for Virtual Visits (Telemedicine).  Patients are able to view lab/test results, encounter notes, upcoming appointments, etc.  Non-urgent messages can be sent to your provider as well.   To learn more about what you can do with MyChart, go to [email or web address].    Your next appointment:   3 month(s)  Provider:   You may see Julien Nordmann, MD  or one of the following Advanced Practice Providers on your designated Care Team:   Cadence Brighton, [place]

## 2023-06-07 ENCOUNTER — Encounter: Payer: Self-pay | Admitting: Internal Medicine

## 2023-06-07 DIAGNOSIS — Q2112 Patent foramen ovale: Secondary | ICD-10-CM | POA: Insufficient documentation

## 2023-06-07 DIAGNOSIS — I471 Supraventricular tachycardia, unspecified: Secondary | ICD-10-CM | POA: Insufficient documentation

## 2023-06-08 ENCOUNTER — Other Ambulatory Visit (HOSPITAL_COMMUNITY): Payer: Self-pay

## 2023-06-08 NOTE — Telephone Encounter (Signed)
Received notification from [facility] regarding a prior authorization for TEZSPIRE. Authorization has been APPROVED from 06/01/23 to 11/20/23. Approval letter sent to scan center.  Unable to run test claim because: LAST FILLD 05-23-23 NEXT FILL 06-13-23  Per [facility] portal, rx was shipped to pt on 05/28/2023  Mychart message sent to patient to determine if copay for most recent shipment was affordable  Chesley Mires, PharmD, MPH, BCPS, CPP Clinical Pharmacist (Rheumatology and Pulmonology)

## 2023-06-14 NOTE — Telephone Encounter (Signed)
Per patient,, copay for Tezspire was $100. She is interested in applying for patient assistance program in case she qualifies. Forms have been emailed to her today.  Provider forms faxed to [facility]. Placed in PAP pending info folder.  Chesley Mires, PharmD, MPH, BCPS, CPP Clinical Pharmacist (Rheumatology and Pulmonology)

## 2023-06-18 ENCOUNTER — Telehealth: Payer: Self-pay

## 2023-06-18 NOTE — Telephone Encounter (Signed)
Received  signed provider forms for Tezspire PAP from Dr. Jayme Cloud. Submission is pending patient forms to be returned.  Chesley Mires, PharmD, MPH, BCPS, CPP Clinical Pharmacist (Rheumatology and Pulmonology)

## 2023-06-18 NOTE — Telephone Encounter (Signed)
Pt made aware and verbalized understanding  From: Antonieta Iba, MD  Sent: 06/17/2023  11:08 AM EDT  To: Cadence David Stall, PA-C   Sounds like finding of small PFO was an incidental finding  Do not think there is much to do given no history of TIA or stroke  I do not think aspirin following Eliquis is indicated given lack of symptoms  Thx  TG  ----- Message -----  From: Marianne Sofia, PA-C  Sent: 06/06/2023   9:21 AM EDT  To: Antonieta Iba, MD   Provoked PE in May on eliquis. Echo showed possible patent foramen ovale during saline study. I was wondering if you could look at echo and make further recommendations in regards to work-up. Thanks

## 2023-06-25 NOTE — Telephone Encounter (Signed)
Called patient to determine status of Tezspire forms. Unable to reach. Left VM requesting update and for her to return forms via email or fax if completed  Chesley Mires, PharmD, MPH, BCPS, CPP Clinical Pharmacist (Rheumatology and Pulmonology)

## 2023-06-28 NOTE — Telephone Encounter (Signed)
Patient sent completed forms via email but unable to open due to [employer] ink being blocked via firewall. I have asked pt to attach pdf, fax, or drop off at pulm clinic  Chesley Mires, PharmD, MPH, BCPS, CPP Clinical Pharmacist (Rheumatology and Pulmonology)

## 2023-06-28 NOTE — Telephone Encounter (Signed)
Received signed patient forms from pt via email. Submitted with provider forms, PA approval, and med list to [employer] via fax  Fax: [phone] Phone: [phone]

## 2023-06-29 ENCOUNTER — Telehealth: Payer: Self-pay

## 2023-06-29 NOTE — Telephone Encounter (Signed)
LM for patient. Received refill request from [facility] pharmacy. Need to confirm with patient that she is using this pharmacy prior to sending meds over.

## 2023-06-29 NOTE — Telephone Encounter (Signed)
Pt returned [place] LPN call. Note below was read to pt. Pt stated, yes, she will starting to use [facility] pharmacy from now on.

## 2023-07-02 ENCOUNTER — Other Ambulatory Visit: Payer: Self-pay

## 2023-07-02 MED ORDER — METOPROLOL SUCCINATE ER 25 MG PO TB24: 25.0000 mg | ORAL_TABLET | Freq: Every day | ORAL | 3 refills | Status: DC

## 2023-07-02 MED ORDER — ESOMEPRAZOLE MAGNESIUM 40 MG PO CPDR: DELAYED_RELEASE_CAPSULE | ORAL | 3 refills | Status: DC

## 2023-07-02 MED ORDER — VALSARTAN-HYDROCHLOROTHIAZIDE 320-25 MG PO TABS: 1.0000 | ORAL_TABLET | Freq: Every day | ORAL | 3 refills | Status: DC

## 2023-07-02 MED ORDER — ROSUVASTATIN CALCIUM 5 MG PO TABS: 5.0000 mg | ORAL_TABLET | ORAL | 3 refills | Status: DC

## 2023-07-02 MED ORDER — APIXABAN 5 MG PO TABS: 5.0000 mg | ORAL_TABLET | Freq: Two times a day (BID) | ORAL | 2 refills | Status: DC

## 2023-07-02 NOTE — Telephone Encounter (Signed)
All prescriptions prescribed by Dr Lorin Picket sent to [facility]

## 2023-07-02 NOTE — Telephone Encounter (Signed)
Received call from [facility] regarding BIV conducted on their end. Per rep, pt does not meet the criteria that would grant her eligibility through pharmacy benefits. Pt's copay has historically been $100 when dispensed from pharmacy, rep informed me that pt's copay would need to be $101 in order to qualify. I asked rep to state to me explicitly that pt was denied due to the difference of one dollar, which she did.  However, the rep informs me that pt's plan covers the medication at 100% when obtained through buy & bill. Will need to have discussion with pt and team regarding the preference of obtaining the medication and the logistics of switching pt to buy & bill respectively.

## 2023-07-02 NOTE — Telephone Encounter (Signed)
Called patient to discuss the Tezspire PAP denial. She states she will continue to self-administer but will let pharmacy team at clinic know if she is interested in transitioning to infusion center. I did advise that we would have to place referral to infusion center and then they would have to run benefits through medical side of insurance. She verbalized understanding.  Chesley Mires, PharmD, MPH, BCPS, CPP Clinical Pharmacist (Rheumatology and Pulmonology)

## 2023-07-05 ENCOUNTER — Encounter (INDEPENDENT_AMBULATORY_CARE_PROVIDER_SITE_OTHER): Payer: Self-pay

## 2023-07-11 ENCOUNTER — Telehealth: Payer: Self-pay | Admitting: Internal Medicine

## 2023-07-11 ENCOUNTER — Encounter: Payer: Self-pay | Admitting: Pulmonary Disease

## 2023-07-11 DIAGNOSIS — J45991 Cough variant asthma: Secondary | ICD-10-CM

## 2023-07-11 NOTE — Telephone Encounter (Signed)
Patient need lab orders.

## 2023-07-11 NOTE — Telephone Encounter (Signed)
Please call and notify pt that she does not need to come in for fasting lab appt 07/20/23.  She just had labs drawn 06/05/23.  Please also cancel lab appt

## 2023-07-12 ENCOUNTER — Encounter: Payer: Self-pay | Admitting: *Deleted

## 2023-07-12 MED ORDER — FLUTICASONE FUROATE-VILANTEROL 200-25 MCG/ACT IN AEPB
1.0000 | INHALATION_SPRAY | Freq: Every day | RESPIRATORY_TRACT | 2 refills | Status: DC
Start: 2023-07-12 — End: 2024-03-03

## 2023-07-20 ENCOUNTER — Other Ambulatory Visit: Payer: BC Managed Care – PPO

## 2023-07-25 ENCOUNTER — Encounter: Payer: Self-pay | Admitting: Internal Medicine

## 2023-07-25 ENCOUNTER — Ambulatory Visit (INDEPENDENT_AMBULATORY_CARE_PROVIDER_SITE_OTHER): Payer: Medicare PPO

## 2023-07-25 ENCOUNTER — Ambulatory Visit: Payer: Medicare PPO | Admitting: Internal Medicine

## 2023-07-25 ENCOUNTER — Other Ambulatory Visit (HOSPITAL_COMMUNITY)
Admission: RE | Admit: 2023-07-25 | Discharge: 2023-07-25 | Disposition: A | Discharge status: Home / Self Care | Payer: Medicare PPO | Source: Ambulatory Visit | Attending: Internal Medicine | Admitting: Internal Medicine

## 2023-07-25 VITALS — BP 120/86 | HR 70 | Temp 98.2°F | Ht 67.0 in | Wt 213.6 lb

## 2023-07-25 DIAGNOSIS — Z01419 Encounter for gynecological examination (general) (routine) without abnormal findings: Secondary | ICD-10-CM | POA: Insufficient documentation

## 2023-07-25 DIAGNOSIS — Z0000 Encounter for general adult medical examination without abnormal findings: Secondary | ICD-10-CM

## 2023-07-25 DIAGNOSIS — J4550 Severe persistent asthma, uncomplicated: Secondary | ICD-10-CM

## 2023-07-25 DIAGNOSIS — M25539 Pain in unspecified wrist: Secondary | ICD-10-CM | POA: Insufficient documentation

## 2023-07-25 DIAGNOSIS — J8283 Eosinophilic asthma: Secondary | ICD-10-CM

## 2023-07-25 DIAGNOSIS — G35 Multiple sclerosis: Secondary | ICD-10-CM

## 2023-07-25 DIAGNOSIS — Z124 Encounter for screening for malignant neoplasm of cervix: Secondary | ICD-10-CM | POA: Diagnosis not present

## 2023-07-25 DIAGNOSIS — I10 Essential (primary) hypertension: Secondary | ICD-10-CM | POA: Diagnosis not present

## 2023-07-25 DIAGNOSIS — Z1151 Encounter for screening for human papillomavirus (HPV): Secondary | ICD-10-CM | POA: Diagnosis not present

## 2023-07-25 DIAGNOSIS — J45991 Cough variant asthma: Secondary | ICD-10-CM

## 2023-07-25 DIAGNOSIS — E7800 Pure hypercholesterolemia, unspecified: Secondary | ICD-10-CM

## 2023-07-25 DIAGNOSIS — L602 Onychogryphosis: Secondary | ICD-10-CM | POA: Insufficient documentation

## 2023-07-25 DIAGNOSIS — Z86010 Personal history of colonic polyps: Secondary | ICD-10-CM

## 2023-07-25 DIAGNOSIS — M25532 Pain in left wrist: Secondary | ICD-10-CM

## 2023-07-25 DIAGNOSIS — I471 Supraventricular tachycardia, unspecified: Secondary | ICD-10-CM

## 2023-07-25 DIAGNOSIS — Z0001 Encounter for general adult medical examination with abnormal findings: Secondary | ICD-10-CM

## 2023-07-25 DIAGNOSIS — Z1231 Encounter for screening mammogram for malignant neoplasm of breast: Secondary | ICD-10-CM

## 2023-07-25 DIAGNOSIS — I4892 Unspecified atrial flutter: Secondary | ICD-10-CM

## 2023-07-25 DIAGNOSIS — I700 Atherosclerosis of aorta: Secondary | ICD-10-CM

## 2023-07-25 NOTE — Progress Notes (Unsigned)
Subjective:    Patient ID: Jackie White, female    DOB: [birth year], 67 y.o.   MRN: [id number]  Patient here for  Chief Complaint  Patient presents with   Medical Management of Chronic Issues    Yearly follow up     HPI Here for a physical exam. Is s/p right knee surgery 04/02/23. Discharged 04/03/23 with home health PT. Was readmitted 04/06/23 - 04/07/23 - after presenting with sob and palpitations (heart rate 120-140). Per record review, was found to have new atrial flutter and subsegmental PE. Was started on eliquis. Atrial flutter resolved spontaneously during her hospitalization. Completed outpatient rehab. Evaluated by oncology - PE. Was treated with 3 month eliquis.  Recommended completion 07/08/23. She is still taking eliquis. Is planing to travel to [place] in October.  Was questioning remaining on eliquis until after her trip.  Will d/w hematology. Saw cardiology 06/05/23. Echo showed normal LVEF, normal diastolic function, normal EVSF, trivial valve disease, positive saline study after valsalva suggestive of patent foramen ovale. Paroxysmal SVT noted on recent heart monitor.  Recommended continuing toprol.  Planning f/u with cardiology.  Patent foramen ovale - Dr Mariah Milling reviewed - felt like small PFO - incidental finding.  No further w/up warranted.  Did not feel aspirin warranted after eliquis. Had f/u with Dr Elwyn Reach 07/11/23 - f/u MS. MRI - stable. No new lesions. Continue ocrelizumab infusion.  She is exercising.  Breathing stable.  No chest pain.  Pain - left hand/left base of thumb.  Desires no further intervention.  Does have thickened toe nails.  Request referral to podiatry.    Past Medical History:  Diagnosis Date   Abnormal Pap smear    Cervical disc disease    Cervical and lumbar disc disease   COVID-19    02/15/22   GERD (gastroesophageal reflux disease)    History of iron deficiency    Hypertension    Multiple sclerosis (HCC)    Personal history of colonic polyps     Reactive airway disease    Vitamin D deficiency    Past Surgical History:  Procedure Laterality Date   BREAST REDUCTION SURGERY     COLONOSCOPY WITH PROPOFOL N/A 03/16/2021   Procedure: COLONOSCOPY WITH PROPOFOL;  Surgeon: Pasty Spillers, MD;  Location: [facility] ENDOSCOPY;  Service: Endoscopy;  Laterality: N/A;   ESOPHAGOGASTRODUODENOSCOPY (EGD) WITH PROPOFOL N/A 03/16/2021   Procedure: ESOPHAGOGASTRODUODENOSCOPY (EGD) WITH PROPOFOL;  Surgeon: Pasty Spillers, MD;  Location: [facility] ENDOSCOPY;  Service: Endoscopy;  Laterality: N/A;   REDUCTION MAMMAPLASTY Bilateral 2002   TOTAL ABDOMINAL HYSTERECTOMY  2003   Right Oophrectomy   Family History  Problem Relation Age of Onset   Stroke Mother    Hypertension Mother    Heart disease Maternal Grandmother    Breast cancer Neg Hx    Social History   Socioeconomic History   Marital status: Married    Spouse name: Not on file   Number of children: Not on file   Years of education: Not on file   Highest education level: Bachelor's degree (e.g., BA, AB, BS)  Occupational History   Not on file  Tobacco Use   Smoking status: Never   Smokeless tobacco: Never  Vaping Use   Vaping status: Never Used  Substance and Sexual Activity   Alcohol use: Yes    Alcohol/week: 0.0 standard drinks of alcohol   Drug use: No   Sexual activity: Not on file  Other Topics Concern   Not on file  Social History Narrative   Not on file   Social Determinants of Health   Financial Resource Strain: Low Risk  (04/07/2023)   Received from [facility], [facility]   Overall Financial Resource Strain (CARDIA)    Difficulty of Paying Living Expenses: Not hard at all  Food Insecurity: No Food Insecurity (04/19/2023)   Hunger Vital Sign    Worried About Running Out of Food in the Last Year: Never true    Ran Out of Food in the Last Year: Never true  Transportation Needs: No Transportation Needs (04/19/2023)   PRAPARE -  Administrator, Civil Service (Medical): No    Lack of Transportation (Non-Medical): No  Physical Activity: Insufficiently Active (03/12/2023)   Exercise Vital Sign    Days of Exercise per Week: 3 days    Minutes of Exercise per Session: 30 min  Stress: No Stress Concern Present (03/12/2023)   [employer] of Occupational Health - Occupational Stress Questionnaire    Feeling of Stress : Not at all  Social Connections: Socially Integrated (03/12/2023)   Social Connection and Isolation Panel [NHANES]    Frequency of Communication with Friends and Family: Three times a week    Frequency of Social Gatherings with Friends and Family: Once a week    Attends Religious Services: 1 to 4 times per year    Active Member of [employer] or Organizations: Yes    Attends Engineer, structural: More than 4 times per year    Marital Status: Married     Review of Systems  Constitutional:  Negative for appetite change and unexpected weight change.  HENT:  Negative for congestion and sinus pressure.   Respiratory:  Negative for cough, chest tightness and shortness of breath.   Cardiovascular:  Negative for chest pain, palpitations and leg swelling.  Gastrointestinal:  Negative for abdominal pain, diarrhea, nausea and vomiting.  Genitourinary:  Negative for difficulty urinating and dysuria.  Musculoskeletal:  Negative for myalgias.       Pain - left base of thumb.  Thickened nails - toe nails.   Skin:  Negative for color change and rash.  Neurological:  Negative for dizziness and headaches.  Psychiatric/Behavioral:  Negative for agitation and dysphoric mood.        Objective:     BP 120/86   Pulse 70   Temp 98.2 F (36.8 C) (Oral)   Ht 5\' 7"  (1.702 m)   Wt 213 lb 9.6 oz (96.9 kg)   LMP 12/27/2000   SpO2 97%   BMI 33.45 kg/m  Wt Readings from Last 3 Encounters:  07/25/23 213 lb 9.6 oz (96.9 kg)  06/05/23 216 lb (98 kg)  04/19/23 214 lb (97.1 kg)    Physical  Exam Vitals reviewed.  Constitutional:      General: She is not in acute distress.    Appearance: Normal appearance.  HENT:     Head: Normocephalic and atraumatic.     Right Ear: External ear normal.     Left Ear: External ear normal.  Eyes:     General: No scleral icterus.       Right eye: No discharge.        Left eye: No discharge.     Conjunctiva/sclera: Conjunctivae normal.  Neck:     Thyroid: No thyromegaly.  Cardiovascular:     Rate and Rhythm: Normal rate and regular rhythm.  Pulmonary:     Effort: No respiratory distress.  Breath sounds: Normal breath sounds. No wheezing.  Abdominal:     General: Bowel sounds are normal.     Palpations: Abdomen is soft.     Tenderness: There is no abdominal tenderness.  Genitourinary:    Comments: Normal external genitalia.  Vaginal vault without lesions.  Pap smear performed.  Could not appreciate any adnexal masses or tenderness.   Musculoskeletal:        General: No swelling or tenderness.     Cervical back: Neck supple. No tenderness.  Lymphadenopathy:     Cervical: No cervical adenopathy.  Skin:    Findings: No erythema or rash.  Neurological:     Mental Status: She is alert.  Psychiatric:        Mood and Affect: Mood normal.        Behavior: Behavior normal.      Outpatient Encounter Medications as of 07/25/2023  Medication Sig   albuterol (VENTOLIN HFA) 108 (90 Base) MCG/ACT inhaler Inhale 2 puffs into the lungs every 6 (six) hours as needed for wheezing or shortness of breath (cough).   Albuterol-Budesonide (AIRSUPRA) 90-80 MCG/ACT AERO Inhale 2 puffs into the lungs every 4 (four) hours as needed (Shortness of breath, wheezing).   apixaban (ELIQUIS) 5 MG TABS tablet Take 1 tablet (5 mg total) by mouth 2 (two) times daily.   cetirizine (ZYRTEC) 10 MG tablet Take by mouth.   esomeprazole (NEXIUM) 40 MG capsule TAKE 1 CAPSULE(40 MG) BY MOUTH DAILY   fluticasone furoate-vilanterol (BREO ELLIPTA) 200-25 MCG/ACT AEPB  Inhale 1 puff into the lungs daily.   metoprolol succinate (TOPROL-XL) 25 MG 24 hr tablet Take 1 tablet (25 mg total) by mouth daily.   Ocrelizumab (OCREVUS IV) Inject into the vein.   rosuvastatin (CRESTOR) 5 MG tablet Take 1 tablet (5 mg total) by mouth every Monday, Wednesday, and Friday.   Tezepelumab-ekko (TEZSPIRE) 210 MG/1. SOAJ Inject 210 mg into the skin every 28 (twenty-eight) days.   valsartan-hydrochlorothiazide (DIOVAN-HCT) 320-25 MG tablet Take 1 tablet by mouth daily.   No facility-administered encounter medications on file as of 07/25/2023.     Lab Results  Component Value Date   WBC 6.5 06/05/2023   HGB 12.7 06/05/2023   HCT 39.2 06/05/2023   PLT 354 06/05/2023   GLUCOSE 100 (H) 06/05/2023   CHOL 177 06/05/2023   TRIG 57 06/05/2023   HDL 97 06/05/2023   LDLCALC 69 06/05/2023   ALT 17 06/05/2023   AST 18 06/05/2023   NA 138 06/05/2023   K 4.3 06/05/2023   CL 102 06/05/2023   CREATININE 0.73 06/05/2023   BUN 12 06/05/2023   CO2 26 06/05/2023   TSH 2.22 01/18/2023       Assessment & Plan:  Routine general medical examination at a health care facility  Health care maintenance Assessment & Plan: Physical today 07/25/23.  PAP 05/12/20 - negative with negative HPV.  Mammogram 11/09/22 - Bri[facility] I. Colonoscopy 03/16/21 -Hemorrhoids found on perianal exam. Diverticulosis in the sigmoid colon. The examination was otherwise normal.    Essential hypertension Assessment & Plan: Continue metoprolol and diovan/hctz.  Follow pressures.  Follow metabolic panel.   Orders: -     Hepatic function panel; Future -     Basic metabolic panel; Future  Hypercholesterolemia Assessment & Plan: On crestor.  Low cholesterol diet and exercise.  Follow lipid panel and liver function tests.    Orders: -     Lipid panel; Future  Encounter for screening mammogram for malignant  neoplasm of breast -     3D Screening Mammogram, Left and Right; Future  Cervical cancer screening -      Cytology - PAP  Thickened nails Assessment & Plan: Request referral to podiatry.   Orders: -     Ambulatory referral to Podiatry  Left wrist pain -     DG Wrist 2 Views Left; Future  Aortic atherosclerosis (HCC) Assessment & Plan: Continue crestor.    Atrial flutter, unspecified type [facility]) Assessment & Plan: During recent hospitalization was found to be in aflutter.  Aflutter resolved spontaneously.  CT - positive for LLL subsegmental pulmonary embolus. She was discharged with heart monitor.   Denies any increased heart rate or palpitations currently.  Saw cardiology 06/05/23. Echo showed normal LVEF, normal diastolic function, normal EVSF, trivial valve disease, positive saline study after valsalva suggestive of patent foramen ovale. Paroxysmal SVT noted on recent heart monitor.  Recommended continuing toprol.  Planning f/u with cardiology.  Patent foramen ovale - Dr Mariah Milling reviewed - felt like small PFO - incidental finding.  No further w/up warranted.  Did not feel aspirin warranted after eliquis.   Cough variant asthma Assessment & Plan: Has been doing well on Tezspire.  Breathing better. Compliant with singulair and Breo.  Has AirSupra to use prn.    Eosinophilic asthma Assessment & Plan: Seeing Dr Jayme Cloud.  Doing well on Tezspire.  Breathing better.  Using saline flushes - for post nasal drainage.  Compliant with singulair and Breo.  Has AirSupra to use prn.    History of colon polyps Assessment & Plan: Colonoscopy 03/16/21 - hemorrhoids and diverticulosis.    Multiple sclerosis (HCC) Assessment & Plan: Followed by neurology for MS.  Appears to be stable on ocrevus.  . Had f/u with Dr Elwyn Reach 07/11/23 - f/u MS. MRI - stable. No new lesions. Continue ocrelizumab infusion.    PSVT (paroxysmal supraventricular tachycardia) Assessment & Plan: Saw cardiology 05/2023 - on metoprolol.    Severe persistent asthma, unspecified whether complicated Assessment & Plan: Dr  Jayme Cloud - Continue Tezspire. Continue Singulair and Breo Ellipta      Dale Preston, MD

## 2023-07-25 NOTE — Assessment & Plan Note (Signed)
Physical today 07/25/23.  PAP 05/12/20 - negative with negative HPV.  Mammogram 11/09/22 - Briads I. Colonoscopy 03/16/21 -Hemorrhoids found on perianal exam. Diverticulosis in the sigmoid colon. The examination was otherwise normal.

## 2023-07-26 ENCOUNTER — Encounter: Payer: Self-pay | Admitting: Internal Medicine

## 2023-07-26 LAB — CYTOLOGY - PAP
Comment: NEGATIVE
Diagnosis: NEGATIVE
High risk HPV: NEGATIVE

## 2023-07-26 NOTE — Assessment & Plan Note (Signed)
Colonoscopy 03/16/21 - hemorrhoids and diverticulosis.

## 2023-07-26 NOTE — Assessment & Plan Note (Signed)
Continue crestor 

## 2023-07-26 NOTE — Assessment & Plan Note (Signed)
Continue metoprolol and diovan/hctz.  Follow pressures.  Follow metabolic panel.

## 2023-07-26 NOTE — Assessment & Plan Note (Signed)
Request referral to podiatry.  

## 2023-07-26 NOTE — Assessment & Plan Note (Signed)
Followed by neurology for MS.  Appears to be stable on ocrevus.  . Had f/u with Dr Elwyn Reach 07/11/23 - f/u MS. MRI - stable. No new lesions. Continue ocrelizumab infusion.

## 2023-07-26 NOTE — Assessment & Plan Note (Signed)
During recent hospitalization was found to be in aflutter.  Aflutter resolved spontaneously.  CT - positive for LLL subsegmental pulmonary embolus. She was discharged with heart monitor.   Denies any increased heart rate or palpitations currently.  Saw cardiology 06/05/23. Echo showed normal LVEF, normal diastolic function, normal EVSF, trivial valve disease, positive saline study after valsalva suggestive of patent foramen ovale. Paroxysmal SVT noted on recent heart monitor.  Recommended continuing toprol.  Planning f/u with cardiology.  Patent foramen ovale - Dr Mariah Milling reviewed - felt like small PFO - incidental finding.  No further w/up warranted.  Did not feel aspirin warranted after eliquis.

## 2023-07-26 NOTE — Assessment & Plan Note (Signed)
On crestor.  Low cholesterol diet and exercise.  Follow lipid panel and liver function tests.

## 2023-07-26 NOTE — Assessment & Plan Note (Signed)
Saw cardiology 05/2023 - on metoprolol.

## 2023-07-26 NOTE — Assessment & Plan Note (Signed)
Dr Jayme Cloud - Continue Tezspire. Continue Singulair and Breo Ellipta

## 2023-07-26 NOTE — Assessment & Plan Note (Signed)
Seeing Dr Gonzalez.  Doing well on Tezspire.  Breathing better.  Using saline flushes - for post nasal drainage.  Compliant with singulair and Breo.  Has AirSupra to use prn.  

## 2023-07-26 NOTE — Assessment & Plan Note (Signed)
Has been doing well on Tezspire.  Breathing better. Compliant with singulair and Breo.  Has AirSupra to use prn.

## 2023-07-31 ENCOUNTER — Encounter: Payer: Self-pay | Admitting: Internal Medicine

## 2023-08-01 NOTE — Telephone Encounter (Signed)
Notify her that I have not heard yet from the xray results.  Please call and see if we can get xray results.  Also, let her know that it is ok to continue on eliquis until after her trip.  (Treatment recommendations say 3-6 months) and she still falls into this window.  Let me know if any questions or concerns.

## 2023-08-02 NOTE — Telephone Encounter (Signed)
Patient is aware of below. 

## 2023-08-03 ENCOUNTER — Ambulatory Visit: Payer: Medicare PPO | Admitting: Podiatry

## 2023-08-03 ENCOUNTER — Encounter: Payer: Self-pay | Admitting: Podiatry

## 2023-08-03 DIAGNOSIS — B351 Tinea unguium: Secondary | ICD-10-CM | POA: Diagnosis not present

## 2023-08-03 MED ORDER — TERBINAFINE HCL 250 MG PO TABS: 250.0000 mg | ORAL_TABLET | Freq: Every day | ORAL | 0 refills | Status: DC

## 2023-08-19 NOTE — Progress Notes (Signed)
   Chief Complaint  Patient presents with   Nail Problem    Patient is here for nail fungus    Subjective: 67 y.o. female presenting today for evaluation of thickening with discoloration of the bilateral toenails.  She is concerned for possible toenail fungus.  She has not anything for treatment  Past Medical History:  Diagnosis Date   Abnormal Pap smear    Cervical disc disease    Cervical and lumbar disc disease   COVID-19    02/15/22   GERD (gastroesophageal reflux disease)    History of iron deficiency    Hypertension    Multiple sclerosis (HCC)    Personal history of colonic polyps    Reactive airway disease    Vitamin D deficiency     Past Surgical History:  Procedure Laterality Date   BREAST REDUCTION SURGERY     COLONOSCOPY WITH PROPOFOL N/A 03/16/2021   Procedure: COLONOSCOPY WITH PROPOFOL;  Surgeon: Pasty Spillers, MD;  Location: [facility] ENDOSCOPY;  Service: Endoscopy;  Laterality: N/A;   ESOPHAGOGASTRODUODENOSCOPY (EGD) WITH PROPOFOL N/A 03/16/2021   Procedure: ESOPHAGOGASTRODUODENOSCOPY (EGD) WITH PROPOFOL;  Surgeon: Pasty Spillers, MD;  Location: [facility] ENDOSCOPY;  Service: Endoscopy;  Laterality: N/A;   REDUCTION MAMMAPLASTY Bilateral 2002   TOTAL ABDOMINAL HYSTERECTOMY  2003   Right Oophrectomy    No Known Allergies     08/03/2023  Objective: Physical Exam General: The patient is alert and oriented x3 in no acute distress.  Dermatology: Although there is nail polish on the nails there is clear indication of hyperkeratotic, discolored, thickened, onychodystrophy noted. Skin is warm, dry and supple bilateral lower extremities. Negative for open lesions or macerations.  Vascular: Palpable pedal pulses bilaterally. No edema or erythema noted. Capillary refill within normal limits.  Neurological: Grossly intact via light touch  Musculoskeletal Exam: No pedal deformity noted  Assessment: #1 Onychomycosis of toenails  Plan of Care:  #1 Patient  was evaluated. #2  Today we discussed different treatment options including oral, topical, and laser antifungal treatment modalities.  We discussed their efficacies and side effects.  Patient opts for oral antifungal treatment modality #3 prescription for Lamisil 250 mg #90 daily. Pt denies a history of liver pathology or symptoms.  Hepatic function panel 06/05/2023 WNL #4 return to clinic 6 months   Felecia Shelling, DPM [facility]  Dr. Felecia Shelling, DPM    [address] [place], [place] [address]                Office [phone]  Fax [phone]

## 2023-09-05 ENCOUNTER — Ambulatory Visit: Payer: Medicare PPO | Admitting: Pulmonary Disease

## 2023-09-05 ENCOUNTER — Encounter: Payer: Self-pay | Admitting: Pulmonary Disease

## 2023-09-05 VITALS — BP 120/80 | HR 56 | Temp 98.0°F | Ht 67.0 in | Wt 211.6 lb

## 2023-09-05 DIAGNOSIS — Z86711 Personal history of pulmonary embolism: Secondary | ICD-10-CM

## 2023-09-05 DIAGNOSIS — G35 Multiple sclerosis: Secondary | ICD-10-CM | POA: Diagnosis not present

## 2023-09-05 DIAGNOSIS — R053 Chronic cough: Secondary | ICD-10-CM | POA: Diagnosis not present

## 2023-09-05 DIAGNOSIS — J45991 Cough variant asthma: Secondary | ICD-10-CM

## 2023-09-05 DIAGNOSIS — G35D Multiple sclerosis, unspecified: Secondary | ICD-10-CM

## 2023-09-05 DIAGNOSIS — J4550 Severe persistent asthma, uncomplicated: Secondary | ICD-10-CM | POA: Diagnosis not present

## 2023-09-05 DIAGNOSIS — D721 Eosinophilia, unspecified: Secondary | ICD-10-CM | POA: Diagnosis not present

## 2023-09-05 LAB — NITRIC OXIDE: Nitric Oxide: 110

## 2023-09-05 MED ORDER — AZITHROMYCIN 250 MG PO TABS: ORAL_TABLET | ORAL | 0 refills | Status: AC

## 2023-09-05 MED ORDER — METHYLPREDNISOLONE 4 MG PO TBPK: ORAL_TABLET | ORAL | 0 refills | Status: DC

## 2023-09-05 NOTE — Patient Instructions (Signed)
VISIT SUMMARY:  During your visit, we discussed your recent symptoms of congestion and coughing, which are likely due to seasonal allergies. We also talked about your recent knee replacement surgery and the blood clot that followed. Your multiple sclerosis seems to be well-controlled with your current medication. We also prepared for your upcoming trip to [place] by providing a prescription for prednisone and an antibiotic to prevent potential health issues during your travel.  YOUR PLAN:  -ASTHMA: Your recent increase in congestion and coughing is likely due to seasonal allergies. Your current medications are working well overall, but we will increase the use of Airsupra over the next few days to manage inflammation.  -TRAVEL PREPARATION: To prepare for your trip to [place], we have provided a pack of prednisone and an antibiotic. These can be used as needed during your travel to prevent potential respiratory issues.  -MULTIPLE SCLEROSIS: Your multiple sclerosis is stable with your current treatment of Ocrevus. Continue taking this medication as prescribed.  -POST-OPERATIVE CARE (KNEE REPLACEMENT): After your recent knee replacement surgery, you developed a blood clot. This is currently being managed with Eliquis. It's important to continue taking this medication, especially due to your upcoming flight.  INSTRUCTIONS:  Please continue with your current medications as prescribed. Increase the use of Airsupra over the next few days to manage your asthma symptoms. For your trip to [place], take the prednisone and antibiotic as needed to prevent potential respiratory issues. We will follow up in 2-3 months to assess your asthma control post-travel.

## 2023-09-05 NOTE — Progress Notes (Signed)
Subjective:    Patient ID: Jackie White, female    DOB: 1955-12-13, 67 y.o.   MRN: [id number]  Patient Care Team: Jackie Oasis, MD as PCP - General (Internal Medicine) Jackie Saner, MD as Consulting Physician (Pulmonary Disease)  Chief Complaint  Patient presents with   Follow-up    Cough and wheezing. No shortness of breath.    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  I last saw the patient on 04 January 2023. She was started on Tezspire in June 2024.  In the interim since her prior visit right total knee replacement on 02 Apr 2023 and subsequently was admitted briefly to [facility] on 17 May due to DVT/PE post op.  She is on Eliquis.  HPI Discussed the use of AI scribe software for clinical note transcription with the patient, who gave verbal consent to proceed.  History of Present Illness   The patient, with a history of severe persistent asthma and multiple sclerosis, presents with recent onset of congestion and coughing. She attributes these symptoms to the current ragweed and goldenrod season. Despite these symptoms, she reports that her current regimen of Jackie White, Jackie White, and Jackie White is working well. She has also been using the Airsupra as needed, in addition to her daily dose of Breo.  The patient also reports a recent knee replacement surgery, which was complicated by a blood clot requiring hospital admission. She is currently on Eliquis due to this event and the upcoming long-haul flight. She is also on Ocrevus for her multiple sclerosis, which she reports is well-controlled.  In preparation for an upcoming trip to [place], the patient requests a prescription for prednisone and an antibiotic to prevent potential health issues during her travel. She has not reported any fevers or chills.     Review  of Systems A 10 point review of systems was performed and it is as noted above otherwise negative.   Patient Active Problem List   Diagnosis Date Noted   Thickened nails 07/25/2023   Wrist pain 07/25/2023   PSVT (paroxysmal supraventricular tachycardia) (HCC) 06/07/2023   Patent foramen ovale 06/07/2023   History of right knee surgery 04/15/2023   Pulmonary embolus (HCC) 04/15/2023   Atrial flutter (HCC) 04/15/2023   Pre-op evaluation 03/18/2023   Nasal congestion 10/23/2022   Severe persistent asthma 09/28/2022   Gas bloat syndrome 07/23/2022   History of COVID-19 03/09/2022   Eosinophilic asthma 11/17/2021   Eosinophilia 10/10/2021   History of colon polyps 03/10/2021   History of iron deficiency 03/10/2021   PUD (peptic ulcer disease) 03/10/2021   Aortic atherosclerosis (HCC) 01/24/2021   Skin lesion 09/19/2020   Cough variant asthma 11/19/2019   Abnormal EKG 02/21/2019   Family history of coronary artery disease 02/21/2019   Cough 01/05/2019   Shortness of breath 01/05/2019   Wheezing 07/21/2018   Impingement syndrome of left shoulder region 05/01/2017   Osteoarthritis of knee 05/01/2017   Trochanteric bursitis of right hip 05/01/2017   Left shoulder pain 04/24/2017   Right knee pain 04/24/2017   Hemorrhoids 08/02/2016   Low back pain 05/17/2016   Right hip pain 05/17/2016   Chest tightness 03/01/2016   Rash 01/31/2015   Health care maintenance 01/31/2015   Left knee pain 10/12/2013   Pain in joint involving lower leg 10/12/2013   GERD (gastroesophageal reflux disease) 12/29/2012   Abnormal  Pap smear of cervix 12/29/2012   Essential hypertension 12/25/2012   Anemia 12/25/2012   Degeneration of lumbar or lumbosacral intervertebral disc 12/25/2012   Hypercholesterolemia 12/25/2012   Vitamin D deficiency 12/25/2012   Multiple sclerosis (HCC) 12/25/2012    Social History   Tobacco Use   Smoking status: Never   Smokeless tobacco: Never  Substance Use Topics    Alcohol use: Yes    Alcohol/week: 0.0 standard drinks of alcohol    No Known Allergies  Current Meds  Medication Sig   Albuterol-Budesonide (AIRSUPRA) 90-80 MCG/ACT AERO Inhale 2 puffs into the lungs every 4 (four) hours as needed (Shortness of breath, wheezing).   apixaban (ELIQUIS) 5 MG TABS tablet Take 1 tablet (5 mg total) by mouth 2 (two) times daily.   cetirizine (ZYRTEC) 10 MG tablet Take by mouth.   esomeprazole (NEXIUM) 40 MG capsule TAKE 1 CAPSULE(40 MG) BY MOUTH DAILY   fluticasone furoate-vilanterol (BREO ELLIPTA) 200-25 MCG/ACT AEPB Inhale 1 puff into the lungs daily.   metoprolol succinate (TOPROL-XL) 25 MG 24 hr tablet Take 1 tablet (25 mg total) by mouth daily.   Ocrelizumab (OCREVUS IV) Inject into the vein.   rosuvastatin (CRESTOR) 5 MG tablet Take 1 tablet (5 mg total) by mouth every Monday, Wednesday, and Friday.   terbinafine (LAMISIL) 250 MG tablet Take 1 tablet (250 mg total) by mouth daily.   Tezepelumab-ekko (TEZSPIRE) 210 MG/1. SOAJ Inject 210 mg into the skin every 28 (twenty-eight) days.   valsartan-hydrochlorothiazide (DIOVAN-HCT) 320-25 MG tablet Take 1 tablet by mouth daily.    Immunization History  Administered Date(s) Administered   Influenza Split 09/26/2012, 09/24/2013   Influenza, High Dose Seasonal PF 09/08/2019   Influenza,inj,Quad PF,6+ Mos 09/14/2020, 11/10/2021   Influenza-Unspecified 08/27/2014, 09/21/2015, 09/20/2016, 09/10/2018, 09/20/2022   PFIZER(Purple Top)SARS-COV-2 Vaccination 02/10/2020, 03/02/2020, 11/16/2020, 03/18/2021   Pfizer Covid-19 Vaccine Bivalent Booster 33yrs & up 08/25/2021   Zoster Recombinant(Shingrix) 09/11/2019, 11/12/2019        Objective:     BP 120/80 (BP Location: Left Arm, Patient Position: Sitting, Cuff Size: Normal)   Pulse (!) 56   Temp 98 F (36.7 C) (Temporal)   Ht 5\' 7"  (1.702 m)   Wt 211 lb 9.6 oz (96 kg)   LMP 12/27/2000   SpO2 99%   BMI 33.14 kg/m   SpO2: 99 %  GENERAL: Overweight  woman, no acute distress.  Fully ambulatory.  No conversational dyspnea.  No respiratory distress. HEAD: Normocephalic, atraumatic.   EYES: Pupils equal, round, reactive to light.  No scleral icterus. MOUTH: Nose/mouth/throat not examined due to masking requirements for COVID 19. NECK: Supple. No thyromegaly. Trachea midline. No JVD.  No adenopathy. PULMONARY: Good air entry bilaterally.  No adventitious sounds noted.   CARDIOVASCULAR: S1 and S2. Regular rate and rhythm.  No rubs, murmurs or gallops heard. ABDOMEN: Benign. MUSCULOSKELETAL: No joint deformity, no clubbing, no edema. NEUROLOGIC: No focal deficit, no gait disturbance, speech is fluent. SKIN: Intact,warm,dry.  No rashes. PSYCH: Mood and behavior normal.   Lab Results  Component Value Date   NITRICOXIDE 110 09/05/2023  *Priors: 181>>133>>117   Assessment & Plan:     ICD-10-CM   1. Severe persistent asthma without complication  J45.50 Nitric oxide    2. Multiple sclerosis (HCC)  G35     3. Chronic cough - RESOLVED!  R05.3     4. Eosinophilia, unspecified type  D72.10      Orders Placed This Encounter  Procedures   Nitric oxide  Meds ordered this encounter  Medications   methylPREDNISolone (MEDROL DOSEPAK) 4 MG TBPK tablet    Sig: Take as directed in package    Dispense:  21 tablet    Refill:  0   azithromycin (ZITHROMAX) 250 MG tablet    Sig: Take 2 tablets (500 mg) on  Day 1,  followed by 1 tablet (250 mg) once daily on Days 2 through 5.    Dispense:  6 each    Refill:  0   Assessment and Plan    Asthma Recent increase in congestion and coughing likely due to seasonal allergies. Current regimen of Delia Chimes, and Jackie White appears to be effective overall. Mild exacerbation noted with airway inflammation level of 110. Overall on DOWNWARD trend. -Continue Tezspire and Breo daily. -Increase use of Airsupra over the next few days to manage inflammation.  Travel Preparation Patient traveling to [place]  at the end of the month and requested prednisone and an antibiotic for potential respiratory issues during the trip. -Provide a pack of prednisone and an antibiotic to be used as needed during travel.  Multiple Sclerosis Stable on Ocrevus. -Continue current treatment.  Post-operative care (Knee replacement) Recent knee replacement complicated by a blood clot (DVT/PE), currently managed with Eliquis. -Continue Eliquis, especially important due to upcoming flight.  Follow-up in 2-3 months to assess asthma control post-travel.      Gailen Shelter, MD Advanced Bronchoscopy PCCM [facility]    *This note was dictated using voice recognition software/Dragon.  Despite best efforts to proofread, errors can occur which can change the meaning. Any transcriptional errors that result from this process are unintentional and may not be fully corrected at the time of dictation.

## 2023-09-06 NOTE — Progress Notes (Signed)
Cardiology Office Note  Date:  09/07/2023   ID:  Jackie White, DOB 07-27-1956, MRN [id number]  PCP:  Jackie Ramona, MD   Chief Complaint  Patient presents with   3 month follow up    "Doing well." Medications reviewed by the patient verbally.     HPI:  [name] is a 67 year-old woman with past medical history of MS, stable on infusions every 6 months,  On ocrelizumab Anemia Chronic cough, better on singulair GERD CT coronary calcium scoring of 0, no other abnormalities on CT scan Acute DVT,  provoked PE 03/2023 seen at [facility] Who presents for follow-up of her SOB (seen on 12/31/2018), leg swelling  Last seen by myself in clinic 11/22 Seen by one of our providers July 2024  Knee replacement, right Completed rehab Developed acute provoked PE 03/2023 , still on eliquis 5 twice daily CTA chest at [facility]: Positive for left lower lobe subsegmental pulmonary emboli.  Reports that she has made full recovery, denies significant shortness of breath on exertion Completed rehab for knee  Last year traveled to [place] with [employer] Traveling to [place] oct 2024  Consistent with every 6 month treatment of MS  CT scan March 2022 reviewed Minimal aortic athero on CT, minimal in the arch  Nonsmoker Nondiabetic  EKG personally reviewed by myself on todays visit EKG Interpretation Date/Time:  Friday September 07 2023 11:22:00 EDT Ventricular Rate:  60 PR Interval:  186 QRS Duration:  72 QT Interval:  416 QTC Calculation: 416 R Axis:   -15  Text Interpretation: Normal sinus rhythm normall EKG When compared with ECG of 05-Jun-2023 15:27, No significant change was found Confirmed by Julien Nordmann [phone]) on 09/07/2023 11:57:17 AM    Other past medical history reviewed Last seen in clinic July 2020 She reported periodic leg swelling, was sitting for long periods of time at work, also on her feet all day at times, case manager [employer]  Echocardiogram June 2020 with normal  ejection fraction Grade 2 diastolic dysfunction, mildly elevated right heart pressures, mildly dilated left atrium  Infusion  Q6 months for MS Disease has been relatively well controlled past 2 years  Family hx Aunt has amyloid CAD  PMH:   has a past medical history of Abnormal Pap smear, Cervical disc disease, COVID-19, GERD (gastroesophageal reflux disease), History of iron deficiency, Hypertension, Multiple sclerosis (HCC), Personal history of colonic polyps, Reactive airway disease, and Vitamin D deficiency.  PSH:    Past Surgical History:  Procedure Laterality Date   BREAST REDUCTION SURGERY     COLONOSCOPY WITH PROPOFOL N/A 03/16/2021   Procedure: COLONOSCOPY WITH PROPOFOL;  Surgeon: Pasty Spillers, MD;  Location: [facility] ENDOSCOPY;  Service: Endoscopy;  Laterality: N/A;   ESOPHAGOGASTRODUODENOSCOPY (EGD) WITH PROPOFOL N/A 03/16/2021   Procedure: ESOPHAGOGASTRODUODENOSCOPY (EGD) WITH PROPOFOL;  Surgeon: Pasty Spillers, MD;  Location: [facility] ENDOSCOPY;  Service: Endoscopy;  Laterality: N/A;   REDUCTION MAMMAPLASTY Bilateral 2002   TOTAL ABDOMINAL HYSTERECTOMY  2003   Right Oophrectomy    Current Outpatient Medications  Medication Sig Dispense Refill   Albuterol-Budesonide (AIRSUPRA) 90-80 MCG/ACT AERO Inhale 2 puffs into the lungs every 4 (four) hours as needed (Shortness of breath, wheezing). 10.7 g 6   apixaban (ELIQUIS) 5 MG TABS tablet Take 1 tablet (5 mg total) by mouth 2 (two) times daily. 180 tablet 2   azithromycin (ZITHROMAX) 250 MG tablet Take 2 tablets (500 mg) on  Day 1,  followed by 1 tablet (250 mg)  once daily on Days 2 through 5. 6 each 0   cetirizine (ZYRTEC) 10 MG tablet Take by mouth.     esomeprazole (NEXIUM) 40 MG capsule TAKE 1 CAPSULE(40 MG) BY MOUTH DAILY 90 capsule 3   fluticasone furoate-vilanterol (BREO ELLIPTA) 200-25 MCG/ACT AEPB Inhale 1 puff into the lungs daily. 180 each 2   methylPREDNISolone (MEDROL DOSEPAK) 4 MG TBPK tablet Take as directed  in package 21 tablet 0   metoprolol succinate (TOPROL-XL) 25 MG 24 hr tablet Take 1 tablet (25 mg total) by mouth daily. 90 tablet 3   Ocrelizumab (OCREVUS IV) Inject into the vein.     rosuvastatin (CRESTOR) 5 MG tablet Take 1 tablet (5 mg total) by mouth every Monday, Wednesday, and Friday. 39 tablet 3   terbinafine (LAMISIL) 250 MG tablet Take 1 tablet (250 mg total) by mouth daily. 90 tablet 0   Tezepelumab-ekko (TEZSPIRE) 210 MG/1. SOAJ Inject 210 mg into the skin every 28 (twenty-eight) days. 1.91 mL 2   valsartan-hydrochlorothiazide (DIOVAN-HCT) 320-25 MG tablet Take 1 tablet by mouth daily. 90 tablet 3   No current facility-administered medications for this visit.     Allergies:   Patient has no known allergies.   Social History:  The patient  reports that she has never smoked. She has never used smokeless tobacco. She reports current alcohol use. She reports that she does not use drugs.   Family History:   family history includes Heart disease in her maternal grandmother; Hypertension in her mother; Stroke in her mother.    Review of Systems: Review of Systems  Constitutional: Negative.   HENT: Negative.    Respiratory: Negative.    Cardiovascular: Negative.   Gastrointestinal: Negative.   Musculoskeletal: Negative.   Neurological: Negative.   Psychiatric/Behavioral: Negative.    All other systems reviewed and are negative.   PHYSICAL EXAM: VS:  BP 120/76 (BP Location: Left Arm, Patient Position: Sitting, Cuff Size: Normal)   Pulse 60   Ht 5\' 7"  (1.702 m)   Wt 212 lb (96.2 kg)   LMP 12/27/2000   SpO2 97%   BMI 33.20 kg/m  , BMI Body mass index is 33.2 kg/m. Constitutional:  oriented to person, place, and time. No distress.  HENT:  Head: Grossly normal Eyes:  no discharge. No scleral icterus.  Neck: No JVD, no carotid bruits  Cardiovascular: Regular rate and rhythm, no murmurs appreciated Pulmonary/Chest: Clear to auscultation bilaterally, no wheezes or  rails Abdominal: Soft.  no distension.  no tenderness.  Musculoskeletal: Normal range of motion Neurological:  normal muscle tone. Coordination normal. No atrophy Skin: Skin warm and dry Psychiatric: normal affect, pleasant  Recent Labs: 01/18/2023: TSH 2.22 06/05/2023: ALT 17; BUN 12; Creatinine, Ser 0.73; Hemoglobin 12.7; Platelets 354; Potassium 4.3; Sodium 138    Lipid Panel Lab Results  Component Value Date   CHOL 177 06/05/2023   HDL 97 06/05/2023   LDLCALC 69 06/05/2023   TRIG 57 06/05/2023      Wt Readings from Last 3 Encounters:  09/07/23 212 lb (96.2 kg)  09/05/23 211 lb 9.6 oz (96 kg)  07/25/23 213 lb 9.6 oz (96.9 kg)     ASSESSMENT AND PLAN:  Problem List Items Addressed This Visit       Cardiology Problems   Essential hypertension   Relevant Orders   EKG 12-Lead (Completed)   Hypercholesterolemia   Aortic atherosclerosis (HCC)   Relevant Orders   EKG 12-Lead (Completed)   Atrial flutter (HCC)  Relevant Orders   EKG 12-Lead (Completed)   PSVT (paroxysmal supraventricular tachycardia) (HCC) - Primary   Relevant Orders   EKG 12-Lead (Completed)   Pulmonary embolus (HCC)     Other   Shortness of breath   Relevant Orders   EKG 12-Lead (Completed)   Shortness of breath Breathing is stable, appears euvolemic  Essential hypertension Blood pressure is well controlled on today's visit. No changes made to the medications.  MS Has worked with physical therapy, has chronic leg weakness with May 2024 knee replacement on the right Completed PT  DVT/PE Following knee replacement at [facility] on the right May 2024 Remains on Eliquis for now with plans to sto when she gets back from her trip to [place]  Aortic atherosclerosis Minimal athero, in the arch On crestor M/W/F Reasonable cholesterol numbers  Hyperlipidemia Continue reduced dose Crestor   Orders Placed This Encounter  Procedures   EKG 12-Lead      Signed, Dossie Arbour, M.D.,  Ph.D. 09/07/2023  [facility] [place], [place] [phone]

## 2023-09-07 ENCOUNTER — Encounter: Payer: Self-pay | Admitting: Cardiovascular Disease

## 2023-09-07 ENCOUNTER — Ambulatory Visit: Payer: Medicare PPO | Attending: Cardiovascular Disease | Admitting: Cardiovascular Disease

## 2023-09-07 VITALS — BP 120/76 | HR 60 | Ht 67.0 in | Wt 212.0 lb

## 2023-09-07 DIAGNOSIS — E7800 Pure hypercholesterolemia, unspecified: Secondary | ICD-10-CM

## 2023-09-07 DIAGNOSIS — I471 Supraventricular tachycardia, unspecified: Secondary | ICD-10-CM | POA: Diagnosis not present

## 2023-09-07 DIAGNOSIS — I2699 Other pulmonary embolism without acute cor pulmonale: Secondary | ICD-10-CM

## 2023-09-07 DIAGNOSIS — R0602 Shortness of breath: Secondary | ICD-10-CM

## 2023-09-07 DIAGNOSIS — I10 Essential (primary) hypertension: Secondary | ICD-10-CM | POA: Diagnosis not present

## 2023-09-07 DIAGNOSIS — I4892 Unspecified atrial flutter: Secondary | ICD-10-CM

## 2023-09-07 DIAGNOSIS — I700 Atherosclerosis of aorta: Secondary | ICD-10-CM

## 2023-09-07 NOTE — Patient Instructions (Signed)

## 2023-09-10 ENCOUNTER — Other Ambulatory Visit (HOSPITAL_COMMUNITY): Payer: Self-pay

## 2023-09-10 ENCOUNTER — Telehealth: Payer: Self-pay

## 2023-09-10 NOTE — Telephone Encounter (Signed)
*  Pulm  Pharmacy Patient Advocate Encounter   Received notification from CoverMyMeds that prior authorization for Airsupra 90-80MCG/ACT aerosol  is required/requested.   Insurance verification completed.   The patient is insured through [employer] .   Per test claim: PA required; PA submitted to [facility] via CoverMyMeds Key/confirmation #/EOC [id number] Status is pending

## 2023-09-11 ENCOUNTER — Other Ambulatory Visit (HOSPITAL_COMMUNITY): Payer: Self-pay

## 2023-09-11 NOTE — Telephone Encounter (Signed)
Pharmacy Patient Advocate Encounter  Received notification from [facility] that Prior Authorization for Jackie White has been APPROVED from 09/11/2023 to 11/19/2024. Ran test claim, Copay is $0.00. This test claim was processed through [facility]- copay amounts may vary at other pharmacies due to pharmacy/plan contracts, or as the patient moves through the different stages of their insurance plan.

## 2023-10-23 ENCOUNTER — Other Ambulatory Visit (INDEPENDENT_AMBULATORY_CARE_PROVIDER_SITE_OTHER): Payer: Medicare PPO

## 2023-10-23 DIAGNOSIS — E7800 Pure hypercholesterolemia, unspecified: Secondary | ICD-10-CM | POA: Diagnosis not present

## 2023-10-23 DIAGNOSIS — I10 Essential (primary) hypertension: Secondary | ICD-10-CM | POA: Diagnosis not present

## 2023-10-23 LAB — LIPID PANEL
Cholesterol: 153 mg/dL (ref 0–200)
HDL: 75.7 mg/dL (ref >39.00)
LDL Cholesterol: 65 mg/dL (ref 0–99)
NonHDL: 76.82
Total CHOL/HDL Ratio: 2
Triglycerides: 61 mg/dL (ref 0.0–149.0)
VLDL: 12.2 mg/dL (ref 0.0–40.0)

## 2023-10-23 LAB — BASIC METABOLIC PANEL
BUN: 11 mg/dL (ref 6–23)
CO2: 33 meq/L — ABNORMAL HIGH (ref 19–32)
Calcium: 9.2 mg/dL (ref 8.4–10.5)
Chloride: 106 meq/L (ref 96–112)
Creatinine, Ser: 0.78 mg/dL (ref 0.40–1.20)
GFR: 79.68 mL/min (ref >60.00)
Glucose, Bld: 92 mg/dL (ref 70–99)
Potassium: 3.9 meq/L (ref 3.5–5.1)
Sodium: 143 meq/L (ref 135–145)

## 2023-10-23 LAB — HEPATIC FUNCTION PANEL
ALT: 9 U/L (ref 0–35)
AST: 13 U/L (ref 0–37)
Albumin: 3.8 g/dL (ref 3.5–5.2)
Alkaline Phosphatase: 74 U/L (ref 39–117)
Bilirubin, Direct: 0.2 mg/dL (ref 0.0–0.3)
Total Bilirubin: 0.7 mg/dL (ref 0.2–1.2)
Total Protein: 6 g/dL (ref 6.0–8.3)

## 2023-10-25 ENCOUNTER — Encounter: Payer: Self-pay | Admitting: Internal Medicine

## 2023-10-25 ENCOUNTER — Ambulatory Visit: Payer: Medicare PPO | Admitting: Internal Medicine

## 2023-10-25 VITALS — BP 128/72 | HR 62 | Temp 98.0°F | Resp 16 | Ht 67.0 in | Wt 213.8 lb

## 2023-10-25 DIAGNOSIS — E7800 Pure hypercholesterolemia, unspecified: Secondary | ICD-10-CM

## 2023-10-25 DIAGNOSIS — K219 Gastro-esophageal reflux disease without esophagitis: Secondary | ICD-10-CM

## 2023-10-25 DIAGNOSIS — L602 Onychogryphosis: Secondary | ICD-10-CM

## 2023-10-25 DIAGNOSIS — M79645 Pain in left finger(s): Secondary | ICD-10-CM | POA: Insufficient documentation

## 2023-10-25 DIAGNOSIS — I4892 Unspecified atrial flutter: Secondary | ICD-10-CM

## 2023-10-25 DIAGNOSIS — J4550 Severe persistent asthma, uncomplicated: Secondary | ICD-10-CM

## 2023-10-25 DIAGNOSIS — I700 Atherosclerosis of aorta: Secondary | ICD-10-CM | POA: Diagnosis not present

## 2023-10-25 DIAGNOSIS — G35 Multiple sclerosis: Secondary | ICD-10-CM

## 2023-10-25 DIAGNOSIS — I471 Supraventricular tachycardia, unspecified: Secondary | ICD-10-CM

## 2023-10-25 DIAGNOSIS — I10 Essential (primary) hypertension: Secondary | ICD-10-CM | POA: Diagnosis not present

## 2023-10-25 DIAGNOSIS — D721 Eosinophilia, unspecified: Secondary | ICD-10-CM

## 2023-10-25 NOTE — Assessment & Plan Note (Signed)
On lamisil. Seeing podiatry.  Recent liver panel wnl.

## 2023-10-25 NOTE — Assessment & Plan Note (Signed)
Followed by neurology for MS.  Appears to be stable on ocrevus.  . Had f/u with Dr Elwyn Reach 07/11/23 - f/u MS. MRI - stable. No new lesions. Continue ocrelizumab infusion.

## 2023-10-25 NOTE — Assessment & Plan Note (Signed)
No acid reflux reported.  On nexium. EGD -03/16/21 - reactive gastritis. No upper symptoms reported.

## 2023-10-25 NOTE — Assessment & Plan Note (Signed)
On crestor.  Low cholesterol diet and exercise.  Follow lipid panel and liver function tests.   Lab Results  Component Value Date   CHOL 153 10/23/2023   HDL 75.70 10/23/2023   LDLCALC 65 10/23/2023   TRIG 61.0 10/23/2023   CHOLHDL 2 10/23/2023

## 2023-10-25 NOTE — Assessment & Plan Note (Signed)
Saw cardiology 05/2023 - on metoprolol. Stable.

## 2023-10-25 NOTE — Assessment & Plan Note (Signed)
Dr Jayme Cloud - Continue Tezspire. Continue Singulair and Breo Ellipta.  Breathing stable.

## 2023-10-25 NOTE — Assessment & Plan Note (Signed)
Follow cbc.  

## 2023-10-25 NOTE — Progress Notes (Signed)
Subjective:    Patient ID: Jackie White, female    DOB: January 09, 1956, 67 y.o.   MRN: [id number]  Patient here for  Chief Complaint  Patient presents with   Medical Management of Chronic Issues    HPI Here for a scheduled follow up. Is s/p right knee surgery 04/02/23. Discharged 04/03/23 with home health PT. Was readmitted 04/06/23 - 04/07/23 - after presenting with sob and palpitations (heart rate 120-140). Per record review, was found to have new atrial flutter and subsegmental PE. Was started on eliquis. Atrial flutter resolved spontaneously during her hospitalization. Completed outpatient rehab. Evaluated by oncology - PE. Was treated with 3 month eliquis.  Saw cardiology 06/05/23. Echo showed normal LVEF, normal diastolic function, normal EVSF, trivial valve disease, positive saline study after valsalva suggestive of patent foramen ovale. Paroxysmal SVT noted on recent heart monitor.  Recommended continuing toprol. Off eliquis now. Had f/u with Dr Elwyn Reach 07/11/23 - f/u MS. MRI - stable. No new lesions. Continue ocrelizumab infusion. Saw podiatry 08/03/23 - onychomycosis of toenails. Started on lamisil. Tolerating.  Recent liver panel wnl. Had f/u with Dr Jayme Cloud 09/05/23 - stable on current regimen of tezspire, breo and [place]. Had f/u with Dr Mariah Milling 09/07/23 - stable. No changes. Overall doing relatively well. Breathing stable. No acid reflux. Now bowel issues reported. Has had persistent pain - left - base of thumb.  Takes tylenol prn.    Past Medical History:  Diagnosis Date   Abnormal Pap smear    Cervical disc disease    Cervical and lumbar disc disease   COVID-19    02/15/22   GERD (gastroesophageal reflux disease)    History of iron deficiency    Hypertension    Multiple sclerosis (HCC)    Personal history of colonic polyps    Reactive airway disease    Vitamin D deficiency    Past Surgical History:  Procedure Laterality Date   BREAST REDUCTION SURGERY     COLONOSCOPY WITH  PROPOFOL N/A 03/16/2021   Procedure: COLONOSCOPY WITH PROPOFOL;  Surgeon: Pasty Spillers, MD;  Location: [facility] ENDOSCOPY;  Service: Endoscopy;  Laterality: N/A;   ESOPHAGOGASTRODUODENOSCOPY (EGD) WITH PROPOFOL N/A 03/16/2021   Procedure: ESOPHAGOGASTRODUODENOSCOPY (EGD) WITH PROPOFOL;  Surgeon: Pasty Spillers, MD;  Location: [facility] ENDOSCOPY;  Service: Endoscopy;  Laterality: N/A;   REDUCTION MAMMAPLASTY Bilateral 2002   TOTAL ABDOMINAL HYSTERECTOMY  2003   Right Oophrectomy   Family History  Problem Relation Age of Onset   Stroke Mother    Hypertension Mother    Heart disease Maternal Grandmother    Breast cancer Neg Hx    Social History   Socioeconomic History   Marital status: Married    Spouse name: Not on file   Number of children: Not on file   Years of education: Not on file   Highest education level: Bachelor's degree (e.g., BA, AB, BS)  Occupational History   Not on file  Tobacco Use   Smoking status: Never   Smokeless tobacco: Never  Vaping Use   Vaping status: Never Used  Substance and Sexual Activity   Alcohol use: Yes    Alcohol/week: 0.0 standard drinks of alcohol   Drug use: No   Sexual activity: Not on file  Other Topics Concern   Not on file  Social History Narrative   Not on file   Social Determinants of Health   Financial Resource Strain: Low Risk  (10/21/2023)   Overall Financial Resource Strain (CARDIA)  Difficulty of Paying Living Expenses: Not hard at all  Food Insecurity: No Food Insecurity (10/21/2023)   Hunger Vital Sign    Worried About Running Out of Food in the Last Year: Never true    Ran Out of Food in the Last Year: Never true  Transportation Needs: No Transportation Needs (10/21/2023)   PRAPARE - Administrator, Civil Service (Medical): No    Lack of Transportation (Non-Medical): No  Physical Activity: Insufficiently Active (10/21/2023)   Exercise Vital Sign    Days of Exercise per Week: 2 days    Minutes of  Exercise per Session: 30 min  Stress: No Stress Concern Present (10/21/2023)   [employer] of Occupational Health - Occupational Stress Questionnaire    Feeling of Stress : Not at all  Social Connections: Socially Integrated (10/21/2023)   Social Connection and Isolation Panel [NHANES]    Frequency of Communication with Friends and Family: More than three times a week    Frequency of Social Gatherings with Friends and Family: Once a week    Attends Religious Services: 1 to 4 times per year    Active Member of [employer] or Organizations: Yes    Attends Engineer, structural: More than 4 times per year    Marital Status: Married     Review of Systems  Constitutional:  Negative for appetite change and unexpected weight change.  HENT:  Negative for congestion and sinus pressure.   Respiratory:  Negative for chest tightness.        Breathing overall stable.  No increased cough reported.   Cardiovascular:  Negative for chest pain, palpitations and leg swelling.  Gastrointestinal:  Negative for abdominal pain, diarrhea, nausea and vomiting.  Genitourinary:  Negative for difficulty urinating and dysuria.  Musculoskeletal:  Negative for joint swelling and myalgias.  Skin:  Negative for color change and rash.  Neurological:  Negative for dizziness and headaches.  Psychiatric/Behavioral:  Negative for agitation and dysphoric mood.        Objective:     BP 128/72   Pulse 62   Temp 98 F (36.7 C)   Resp 16   Ht 5\' 7"  (1.702 m)   Wt 213 lb 12.8 oz (97 kg)   LMP 12/27/2000   SpO2 98%   BMI 33.49 kg/m  Wt Readings from Last 3 Encounters:  10/25/23 213 lb 12.8 oz (97 kg)  09/07/23 212 lb (96.2 kg)  09/05/23 211 lb 9.6 oz (96 kg)    Physical Exam Vitals reviewed.  Constitutional:      General: She is not in acute distress.    Appearance: Normal appearance.  HENT:     Head: Normocephalic and atraumatic.     Right Ear: External ear normal.     Left Ear: External ear  normal.     Mouth/Throat:     Pharynx: No oropharyngeal exudate or posterior oropharyngeal erythema.  Eyes:     General: No scleral icterus.       Right eye: No discharge.        Left eye: No discharge.     Conjunctiva/sclera: Conjunctivae normal.  Neck:     Thyroid: No thyromegaly.  Cardiovascular:     Rate and Rhythm: Normal rate and regular rhythm.  Pulmonary:     Effort: No respiratory distress.     Breath sounds: Normal breath sounds. No wheezing.  Abdominal:     General: Bowel sounds are normal.     Palpations: Abdomen is  soft.     Tenderness: There is no abdominal tenderness.  Musculoskeletal:        General: No swelling or tenderness.     Cervical back: Neck supple. No tenderness.  Lymphadenopathy:     Cervical: No cervical adenopathy.  Skin:    Findings: No erythema or rash.  Neurological:     Mental Status: She is alert.  Psychiatric:        Mood and Affect: Mood normal.        Behavior: Behavior normal.      Outpatient Encounter Medications as of 10/25/2023  Medication Sig   Albuterol-Budesonide (AIRSUPRA) 90-80 MCG/ACT AERO Inhale 2 puffs into the lungs every 4 (four) hours as needed (Shortness of breath, wheezing).   cetirizine (ZYRTEC) 10 MG tablet Take by mouth.   esomeprazole (NEXIUM) 40 MG capsule TAKE 1 CAPSULE(40 MG) BY MOUTH DAILY   fluticasone furoate-vilanterol (BREO ELLIPTA) 200-25 MCG/ACT AEPB Inhale 1 puff into the lungs daily.   methylPREDNISolone (MEDROL DOSEPAK) 4 MG TBPK tablet Take as directed in package   metoprolol succinate (TOPROL-XL) 25 MG 24 hr tablet Take 1 tablet (25 mg total) by mouth daily.   Ocrelizumab (OCREVUS IV) Inject into the vein.   rosuvastatin (CRESTOR) 5 MG tablet Take 1 tablet (5 mg total) by mouth every Monday, Wednesday, and Friday.   terbinafine (LAMISIL) 250 MG tablet Take 1 tablet (250 mg total) by mouth daily.   Tezepelumab-ekko (TEZSPIRE) 210 MG/1. SOAJ Inject 210 mg into the skin every 28 (twenty-eight) days.    valsartan-hydrochlorothiazide (DIOVAN-HCT) 320-25 MG tablet Take 1 tablet by mouth daily.   [DISCONTINUED] apixaban (ELIQUIS) 5 MG TABS tablet Take 1 tablet (5 mg total) by mouth 2 (two) times daily.   No facility-administered encounter medications on file as of 10/25/2023.     Lab Results  Component Value Date   WBC 6.5 06/05/2023   HGB 12.7 06/05/2023   HCT 39.2 06/05/2023   PLT 354 06/05/2023   GLUCOSE 92 10/23/2023   CHOL 153 10/23/2023   TRIG 61.0 10/23/2023   HDL 75.70 10/23/2023   LDLCALC 65 10/23/2023   ALT 9 10/23/2023   AST 13 10/23/2023   NA 143 10/23/2023   K 3.9 10/23/2023   CL 106 10/23/2023   CREATININE 0.78 10/23/2023   BUN 11 10/23/2023   CO2 33 (H) 10/23/2023   TSH 2.22 01/18/2023    DG Wrist 2 Views Left  Result Date: 08/03/2023 CLINICAL DATA:  Pain thumb and wrist for 2-3 months EXAM: LEFT WRIST - 2 VIEW COMPARISON:  None Available. FINDINGS: There is no evidence of fracture or dislocation. There is no evidence of arthropathy or other focal bone abnormality. Soft tissues are unremarkable. IMPRESSION: Negative. Electronically Signed   By: Layla Maw M.D.   On: 08/03/2023 16:03       Assessment & Plan:  Hypercholesterolemia Assessment & Plan: On crestor.  Low cholesterol diet and exercise.  Follow lipid panel and liver function tests.   Lab Results  Component Value Date   CHOL 153 10/23/2023   HDL 75.70 10/23/2023   LDLCALC 65 10/23/2023   TRIG 61.0 10/23/2023   CHOLHDL 2 10/23/2023     Orders: -     Lipid panel; Future -     Hepatic function panel; Future  Essential hypertension Assessment & Plan: Continue metoprolol and diovan/hctz.  Follow pressures.  Follow metabolic panel.   Orders: -     CBC with Differential/Platelet; Future -  Basic metabolic panel; Future -     TSH; Future  Aortic atherosclerosis (HCC) Assessment & Plan: Continue crestor.    Atrial flutter, unspecified type [facility]) Assessment & Plan: During recent  hospitalization was found to be in aflutter.  Aflutter resolved spontaneously.  CT - positive for LLL subsegmental pulmonary embolus. She was discharged with heart monitor.   Denies any increased heart rate or palpitations currently.  Saw cardiology 06/05/23. Echo showed normal LVEF, normal diastolic function, normal EVSF, trivial valve disease, positive saline study after valsalva suggestive of patent foramen ovale. Paroxysmal SVT noted on recent heart monitor.  Recommended continuing toprol.  Planning f/u with cardiology.  Patent foramen ovale - Dr Mariah Milling reviewed - felt like small PFO - incidental finding.  No further w/up warranted.  Did not feel aspirin warranted after eliquis. Off eliquis.  Doing well.  Appears to be in SR.    Eosinophilia, unspecified type Assessment & Plan: Follow cbc.    Gastroesophageal reflux disease, unspecified whether esophagitis present Assessment & Plan: No acid reflux reported.  On nexium. EGD -03/16/21 - reactive gastritis. No upper symptoms reported.    Multiple sclerosis (HCC) Assessment & Plan: Followed by neurology for MS.  Appears to be stable on ocrevus.  . Had f/u with Dr Elwyn Reach 07/11/23 - f/u MS. MRI - stable. No new lesions. Continue ocrelizumab infusion.    PSVT (paroxysmal supraventricular tachycardia) [facility]) Assessment & Plan: Saw cardiology 05/2023 - on metoprolol. Stable.    Severe persistent asthma, unspecified whether complicated Assessment & Plan: Dr Jayme Cloud - Continue Tezspire. Continue Singulair and Breo Ellipta.  Breathing stable.    Thickened nails Assessment & Plan: On lamisil. Seeing podiatry.  Recent liver panel wnl.    Thumb pain, left Assessment & Plan: Pain thumb - base of left thumb - trial of thumb spica.  Follow.  Notify me if persistent.       Dale Crafton, MD

## 2023-10-25 NOTE — Assessment & Plan Note (Signed)
During recent hospitalization was found to be in aflutter.  Aflutter resolved spontaneously.  CT - positive for LLL subsegmental pulmonary embolus. She was discharged with heart monitor.   Denies any increased heart rate or palpitations currently.  Saw cardiology 06/05/23. Echo showed normal LVEF, normal diastolic function, normal EVSF, trivial valve disease, positive saline study after valsalva suggestive of patent foramen ovale. Paroxysmal SVT noted on recent heart monitor.  Recommended continuing toprol.  Planning f/u with cardiology.  Patent foramen ovale - Dr Mariah Milling reviewed - felt like small PFO - incidental finding.  No further w/up warranted.  Did not feel aspirin warranted after eliquis. Off eliquis.  Doing well.  Appears to be in SR.

## 2023-10-25 NOTE — Assessment & Plan Note (Signed)
Continue crestor 

## 2023-10-25 NOTE — Assessment & Plan Note (Signed)
Pain thumb - base of left thumb - trial of thumb spica.  Follow.  Notify me if persistent.

## 2023-10-25 NOTE — Assessment & Plan Note (Signed)
Continue metoprolol and diovan/hctz.  Follow pressures.  Follow metabolic panel.

## 2023-11-12 ENCOUNTER — Ambulatory Visit
Admission: RE | Admit: 2023-11-12 | Discharge: 2023-11-12 | Disposition: A | Discharge status: Home / Self Care | Payer: Medicare PPO | Source: Ambulatory Visit | Attending: Internal Medicine | Admitting: Internal Medicine

## 2023-11-12 DIAGNOSIS — Z1231 Encounter for screening mammogram for malignant neoplasm of breast: Secondary | ICD-10-CM | POA: Diagnosis present

## 2023-11-13 ENCOUNTER — Telehealth: Payer: Self-pay | Admitting: Pharmacist

## 2023-11-13 NOTE — Telephone Encounter (Signed)
Submitted a Prior Authorization RENEWAL request to [facility] for TEZSPIRE via CoverMyMeds. Will update once we receive a response.  Key: [id number]  Per automated response: Authorization already on file for this request. Authorization starting on 08/21/2023 and ending on 11/19/2024.  Chesley Mires, PharmD, MPH, BCPS, CPP Clinical Pharmacist (Rheumatology and Pulmonology)

## 2023-12-12 ENCOUNTER — Encounter: Payer: Self-pay | Admitting: Pulmonary Disease

## 2023-12-12 ENCOUNTER — Other Ambulatory Visit (HOSPITAL_COMMUNITY): Payer: Self-pay

## 2023-12-12 ENCOUNTER — Other Ambulatory Visit: Payer: Self-pay | Admitting: Pharmacist

## 2023-12-12 DIAGNOSIS — J4550 Severe persistent asthma, uncomplicated: Secondary | ICD-10-CM

## 2023-12-12 MED ORDER — TEZSPIRE 210 MG/1.91ML ~~LOC~~ SOAJ: 210.0000 mg | SUBCUTANEOUS | 5 refills | Status: DC

## 2023-12-12 NOTE — Progress Notes (Signed)
Rx sent to [facility] today for Tezspire.  PA was active through 11/19/2024 per 11/13/2023 prior auth note.  Chesley Mires, PharmD, MPH, BCPS, CPP Clinical Pharmacist (Rheumatology and Pulmonology)

## 2023-12-14 NOTE — Telephone Encounter (Signed)
Spoke with [facility]. Per rep, patient placed order yesterday on 12/12/2022. Rx will be leaving pharmacy today to arrive to patient tomorrow, 12/15/2023. Nothing further needed  Chesley Mires, PharmD, MPH, BCPS, CPP Clinical Pharmacist (Rheumatology and Pulmonology)

## 2024-01-04 ENCOUNTER — Ambulatory Visit: Payer: Medicare PPO | Admitting: Podiatry

## 2024-01-04 ENCOUNTER — Encounter: Payer: Self-pay | Admitting: Podiatry

## 2024-01-04 DIAGNOSIS — B351 Tinea unguium: Secondary | ICD-10-CM | POA: Diagnosis not present

## 2024-01-04 MED ORDER — TERBINAFINE HCL 250 MG PO TABS: 250.0000 mg | ORAL_TABLET | Freq: Every day | ORAL | 0 refills | Status: AC

## 2024-01-04 MED ORDER — CICLOPIROX 8 % EX SOLN: Freq: Every day | CUTANEOUS | 1 refills | Status: AC

## 2024-01-04 NOTE — Progress Notes (Signed)
   Chief Complaint  Patient presents with   Nail Problem    "They alright, I told Dr. Logan Bores that I just want these toenails gone."    Subjective: 68 y.o. female presenting today for follow-up evaluation of thickening with discoloration of the bilateral toenails.  Patient has noticed some slight improvement of the toenails but no significant improvement currently.  Past Medical History:  Diagnosis Date   Abnormal Pap smear    Cervical disc disease    Cervical and lumbar disc disease   COVID-19    02/15/22   GERD (gastroesophageal reflux disease)    History of iron deficiency    Hypertension    Multiple sclerosis (HCC)    Personal history of colonic polyps    Reactive airway disease    Vitamin D deficiency     Past Surgical History:  Procedure Laterality Date   BREAST REDUCTION SURGERY     COLONOSCOPY WITH PROPOFOL N/A 03/16/2021   Procedure: COLONOSCOPY WITH PROPOFOL;  Surgeon: Pasty Spillers, MD;  Location: [facility] ENDOSCOPY;  Service: Endoscopy;  Laterality: N/A;   ESOPHAGOGASTRODUODENOSCOPY (EGD) WITH PROPOFOL N/A 03/16/2021   Procedure: ESOPHAGOGASTRODUODENOSCOPY (EGD) WITH PROPOFOL;  Surgeon: Pasty Spillers, MD;  Location: [facility] ENDOSCOPY;  Service: Endoscopy;  Laterality: N/A;   REDUCTION MAMMAPLASTY Bilateral 2002   TOTAL ABDOMINAL HYSTERECTOMY  2003   Right Oophrectomy    No Known Allergies   B/L toes 01/07/2024  Objective: Physical Exam General: The patient is alert and oriented x3 in no acute distress.  Dermatology: Hyperkeratotic dystrophic discolored nails noted specifically to the bilateral great toes.  Vascular: Palpable pedal pulses bilaterally. No edema or erythema noted. Capillary refill within normal limits.  Neurological: Grossly intact via light touch  Musculoskeletal Exam: No pedal deformity noted  Assessment: #1 Onychomycosis of toenails bilateral  Plan of Care:  -Patient evaluated -Refill prescription for Lamisil 2 and 50 mg #90  daily.  LFT on 10/23/2023 WNL.  Patient tolerated the previous round of oral Lamisil fine without any complications -Prescription for Penlac 8% topical solution apply daily -Return to clinic 6 months   Felecia Shelling, DPM [facility]  Dr. Felecia Shelling, DPM    [address] [place], [place] [address]                Office [phone]  Fax [phone]

## 2024-01-10 ENCOUNTER — Telehealth (INDEPENDENT_AMBULATORY_CARE_PROVIDER_SITE_OTHER): Payer: Medicare PPO | Admitting: Nurse Practitioner

## 2024-01-10 ENCOUNTER — Telehealth: Payer: Self-pay | Admitting: Internal Medicine

## 2024-01-10 ENCOUNTER — Ambulatory Visit: Payer: Self-pay | Admitting: Internal Medicine

## 2024-01-10 DIAGNOSIS — J069 Acute upper respiratory infection, unspecified: Secondary | ICD-10-CM | POA: Diagnosis not present

## 2024-01-10 MED ORDER — BENZONATATE 100 MG PO CAPS
100.0000 mg | ORAL_CAPSULE | Freq: Three times a day (TID) | ORAL | 0 refills | Status: DC | PRN
Start: 2024-01-10 — End: 2024-03-05

## 2024-01-10 MED ORDER — AMOXICILLIN-POT CLAVULANATE 875-125 MG PO TABS: 1.0000 | ORAL_TABLET | Freq: Two times a day (BID) | ORAL | 0 refills | Status: DC

## 2024-01-10 NOTE — Telephone Encounter (Signed)
 Appt scheduled

## 2024-01-10 NOTE — Telephone Encounter (Signed)
Chief Complaint: Cough Symptoms: Productive cough with yellow sputum Frequency: a week Pertinent Negatives: Patient denies CP Disposition: [] ED /[] Urgent Care (no appt availability in office) / [x] Appointment(In office/virtual)/ []  [facility]/ [] Home Care/ [] Refused Recommended Disposition /[] [facility]/ []  Follow-up with PCP Additional Notes: patient with history of asthma called in with concerns for productive cough with yellow sputum. Patient thought she had a head cold but today realized the cough had worsened and was in her chest. Patient is requesting medication to help with the mucus in her chest. Per protocol, recommendation is for an appointment today. A video visit appointment set up for patient for today at 2:40 pm with PCP office. Patient stated understanding about a video visit having done them before. Patient verbalized understanding of the plan and all questions answered.    Copied from CRM [id number]. Topic: Clinical - Red Word Triage >> Jan 10, 2024 10:06 AM Irine Seal wrote: [facility] that prompted transfer to Nurse Triage: patient has history of asthma, and she presents with a productive cough with yellow sputum, stated it started as a head cold and is travelling down into her chest. Patient denies fever, and shortness of breath. She is concerned about developing chest infections due to her asthma. Patient would like medication called in but triaging due to asthma, to see if an acute visit is available. Reason for Disposition  [1] MILD difficulty breathing (e.g., minimal/no SOB at rest, SOB with walking, pulse <100) AND [2] still present when not coughing  Answer Assessment - Initial Assessment Questions 1. ONSET: "When did the cough begin?"      Saturday 2. SEVERITY: "How bad is the cough today?"      8 out of 10 3. SPUTUM: "Describe the color of your sputum" (none, dry cough; clear, white, yellow, green)     Yellow sputum 4. HEMOPTYSIS: "Are you  coughing up any blood?" If so ask: "How much?" (flecks, streaks, tablespoons, etc.)     no 5. DIFFICULTY BREATHING: "Are you having difficulty breathing?" If Yes, ask: "How bad is it?" (e.g., mild, moderate, severe)    - MILD: No SOB at rest, mild SOB with walking, speaks normally in sentences, can lie down, no retractions, pulse < 100.    - MODERATE: SOB at rest, SOB with minimal exertion and prefers to sit, cannot lie down flat, speaks in phrases, mild retractions, audible wheezing, pulse 100-120.    - SEVERE: Very SOB at rest, speaks in single words, struggling to breathe, sitting hunched forward, retractions, pulse > 120      MILD 6. FEVER: "Do you have a fever?" If Yes, ask: "What is your temperature, how was it measured, and when did it start?"     Patient states she ran a low grade fever on Sunday-no fevers today 7. CARDIAC HISTORY: "Do you have any history of heart disease?" (e.g., heart attack, congestive heart failure)      No 8. LUNG HISTORY: "Do you have any history of lung disease?"  (e.g., pulmonary embolus, asthma, emphysema)     asthma 9. PE RISK FACTORS: "Do you have a history of blood clots?" (or: recent major surgery, recent prolonged travel, bedridden)     Yes 10. OTHER SYMPTOMS: "Do you have any other symptoms?" (e.g., runny nose, wheezing, chest pain)       Little bit of wheezing 12. TRAVEL: "Have you traveled out of the country in the last month?" (e.g., travel history, exposures)  No  Protocols used: Cough - Acute Productive-A-AH

## 2024-01-10 NOTE — Progress Notes (Signed)
 Virtual Visit via Video Note  I connected with Jackie White on 01/10/2024 at 2:31 PM by a video enabled telemedicine application and verified that I am speaking with the correct person using two identifiers.  Patient Location: Home Provider Location: Office/Clinic  I discussed the limitations, risks, security, and privacy concerns of performing an evaluation and management service by video and the availability of in person appointments. I also discussed with the patient that there may be a patient responsible charge related to this service. The patient expressed understanding and agreed to proceed.  Subjective: PCP: Dale [place], MD  Chief Complaint  Patient presents with   Cough    Patient says she has been had a chronic cough since Saturday. Patient says she has taken over the counter Robitussin and Tylenol. Patient says she thought it was getting better, but she has noticed a yellowish phlegm. Patient wants to avoid it going down into her chest.    Discussed the use of a AI scribe software for clinical note transcription with the patient, who gave verbal consent to proceed.   HPI  Jackie White is a 68 year old female with asthma who presents with worsening respiratory symptoms.  Symptoms began on Saturday with a scratchy throat. Despite using Robitussin and Tylenol, her condition worsened, progressing to chest involvement by this morning, with a productive cough producing yellow mucus. She experiences slight nasal congestion, some wheezing, and chest discomfort due to coughing. No nasal congestion, ear congestion, headaches, sinus pressure, chest tightness or shortness of breath.  She has a history of asthma and has not used her inhaler due to concerns about potential infection. A nurse advised her to use her nebulizer. She uses her nebulizer and inhaler as needed. She has a prescription for a Z-Pak from her pulmonologist for asthma-related issues, but she has not used it  for this episode. She also has a Medrol Dosepak of prednisone available for asthma exacerbations, but has not yet used it.  Her current medications include Airsupra inhaler and albuterol for the nebulizer. She has been using Robitussin DM and Theraflu for her cough. She has Lawyer at home but notes they may be expired. ROS: Per HPI  Current Outpatient Medications:    Albuterol-Budesonide (AIRSUPRA) 90-80 MCG/ACT AERO, Inhale 2 puffs into the lungs every 4 (four) hours as needed (Shortness of breath, wheezing)., Disp: 10.7 g, Rfl: 6   amoxicillin-clavulanate (AUGMENTIN) 875-125 MG tablet, Take 1 tablet by mouth 2 (two) times daily., Disp: 20 tablet, Rfl: 0   benzonatate (TESSALON PERLES) 100 MG capsule, Take 1 capsule (100 mg total) by mouth 3 (three) times daily as needed., Disp: 30 capsule, Rfl: 0   cetirizine (ZYRTEC) 10 MG tablet, Take by mouth., Disp: , Rfl:    ciclopirox (PENLAC) 8 % solution, Apply topically at bedtime. Apply over nail and surrounding skin. Apply daily over previous coat. After seven (7) days, may remove with alcohol and continue cycle., Disp: 6.6 mL, Rfl: 1   esomeprazole (NEXIUM) 40 MG capsule, TAKE 1 CAPSULE(40 MG) BY MOUTH DAILY, Disp: 90 capsule, Rfl: 3   fluticasone furoate-vilanterol (BREO ELLIPTA) 200-25 MCG/ACT AEPB, Inhale 1 puff into the lungs daily., Disp: 180 each, Rfl: 2   metoprolol succinate (TOPROL-XL) 25 MG 24 hr tablet, Take 1 tablet (25 mg total) by mouth daily., Disp: 90 tablet, Rfl: 3   Ocrelizumab (OCREVUS IV), Inject into the vein., Disp: , Rfl:    rosuvastatin (CRESTOR) 5 MG tablet, Take 1 tablet (5  mg total) by mouth every Monday, Wednesday, and Friday., Disp: 39 tablet, Rfl: 3   terbinafine (LAMISIL) 250 MG tablet, Take 1 tablet (250 mg total) by mouth daily., Disp: 90 tablet, Rfl: 0   Tezepelumab-ekko (TEZSPIRE) 210 MG/1. SOAJ, Inject 210 mg into the skin every 28 (twenty-eight) days., Disp: 1.91 mL, Rfl: 5    valsartan-hydrochlorothiazide (DIOVAN-HCT) 320-25 MG tablet, Take 1 tablet by mouth daily., Disp: 90 tablet, Rfl: 3  Observations/Objective: Today's Vitals   01/10/24 1429  PainSc: 0-No pain   Physical Exam Constitutional:      General: She is not in acute distress.    Appearance: Normal appearance. She is not ill-appearing.  Eyes:     Conjunctiva/sclera: Conjunctivae normal.  Pulmonary:     Effort: No respiratory distress.  Neurological:     Mental Status: She is alert.  Psychiatric:        Mood and Affect: Mood normal.        Behavior: Behavior normal.        Thought Content: Thought content normal.        Judgment: Judgment normal.     Assessment and Plan: URI with cough and congestion Assessment & Plan: Symptoms of cough, yellow phlegm, and wheezing. No sinus pressure, headache, or shortness of breath. -Start Augmentin. -Use Tessalon Perles as needed for cough suppression -Continue using nebulizer as needed. -Use Airsupra inhaler twice daily. -Take over-the-counter Mucinex tablets to help break up congestion. -If symptoms do not improve, contact the office for inperson evaluation.   Other orders -     Amoxicillin-Pot Clavulanate; Take 1 tablet by mouth 2 (two) times daily.  Dispense: 20 tablet; Refill: 0 -     Benzonatate; Take 1 capsule (100 mg total) by mouth 3 (three) times daily as needed.  Dispense: 30 capsule; Refill: 0    Follow Up Instructions: Return if symptoms worsen or fail to improve.   I discussed the assessment and treatment plan with the patient. The patient was provided an opportunity to ask questions, and all were answered. The patient agreed with the plan and demonstrated an understanding of the instructions.   The patient was advised to call back or seek an in-person evaluation if the symptoms worsen or if the condition fails to improve as anticipated.  The above assessment and management plan was discussed with the patient. The patient  verbalized understanding of and has agreed to the management plan.   Jackie Dies, NP

## 2024-01-10 NOTE — Telephone Encounter (Signed)
Left a message to call office before virtual appointment to make sure all info is up to date and to collect copay or she can set copay up through MyChart.

## 2024-01-20 DIAGNOSIS — J069 Acute upper respiratory infection, unspecified: Secondary | ICD-10-CM | POA: Insufficient documentation

## 2024-01-20 NOTE — Assessment & Plan Note (Addendum)
 Symptoms of cough, yellow phlegm, and wheezing. No sinus pressure, headache, or shortness of breath. -Start Augmentin. -Use Tessalon Perles as needed for cough suppression -Continue using nebulizer as needed. -Use Airsupra inhaler twice daily. -Take over-the-counter Mucinex tablets to help break up congestion. -If symptoms do not improve, contact the office for inperson evaluation.

## 2024-01-29 DIAGNOSIS — G35 Multiple sclerosis: Secondary | ICD-10-CM | POA: Diagnosis not present

## 2024-02-15 DIAGNOSIS — H43813 Vitreous degeneration, bilateral: Secondary | ICD-10-CM | POA: Diagnosis not present

## 2024-02-15 DIAGNOSIS — G35 Multiple sclerosis: Secondary | ICD-10-CM | POA: Diagnosis not present

## 2024-02-15 DIAGNOSIS — H2513 Age-related nuclear cataract, bilateral: Secondary | ICD-10-CM | POA: Diagnosis not present

## 2024-02-29 ENCOUNTER — Encounter: Payer: Self-pay | Admitting: Internal Medicine

## 2024-02-29 ENCOUNTER — Other Ambulatory Visit: Payer: Self-pay | Admitting: Pulmonary Disease

## 2024-02-29 DIAGNOSIS — J45991 Cough variant asthma: Secondary | ICD-10-CM

## 2024-02-29 NOTE — Telephone Encounter (Signed)
 It is ok for her to keep appts. We asked for her to wear a mask. With her underlying health issues, please confirm that she does not feel needs to be seen earlier. (Given her medical issues and her current treatments). Monitor symptoms. If any change or problems, she is to be evaluated.

## 2024-03-03 ENCOUNTER — Other Ambulatory Visit: Payer: Medicare PPO

## 2024-03-03 DIAGNOSIS — I10 Essential (primary) hypertension: Secondary | ICD-10-CM

## 2024-03-03 DIAGNOSIS — E7800 Pure hypercholesterolemia, unspecified: Secondary | ICD-10-CM

## 2024-03-03 LAB — BASIC METABOLIC PANEL WITH GFR
BUN: 7 mg/dL (ref 6–23)
CO2: 28 meq/L (ref 19–32)
Calcium: 9.1 mg/dL (ref 8.4–10.5)
Chloride: 103 meq/L (ref 96–112)
Creatinine, Ser: 0.7 mg/dL (ref 0.40–1.20)
GFR: 90.5 mL/min (ref >60.00)
Glucose, Bld: 89 mg/dL (ref 70–99)
Potassium: 4.4 meq/L (ref 3.5–5.1)
Sodium: 141 meq/L (ref 135–145)

## 2024-03-03 LAB — CBC WITH DIFFERENTIAL/PLATELET
Basophils Absolute: 0 10*3/uL (ref 0.0–0.1)
Basophils Relative: 0.5 % (ref 0.0–3.0)
Eosinophils Absolute: 0.2 10*3/uL (ref 0.0–0.7)
Eosinophils Relative: 4.8 % (ref 0.0–5.0)
HCT: 40.1 % (ref 36.0–46.0)
Hemoglobin: 13.2 g/dL (ref 12.0–15.0)
Lymphocytes Relative: 55.2 % — ABNORMAL HIGH (ref 12.0–46.0)
Lymphs Abs: 2.2 10*3/uL (ref 0.7–4.0)
MCHC: 32.8 g/dL (ref 30.0–36.0)
MCV: 86.6 fl (ref 78.0–100.0)
Monocytes Absolute: 0.5 10*3/uL (ref 0.1–1.0)
Monocytes Relative: 13 % — ABNORMAL HIGH (ref 3.0–12.0)
Neutro Abs: 1.1 10*3/uL — ABNORMAL LOW (ref 1.4–7.7)
Neutrophils Relative %: 26.5 % — ABNORMAL LOW (ref 43.0–77.0)
Platelets: 249 10*3/uL (ref 150.0–400.0)
RBC: 4.63 Mil/uL (ref 3.87–5.11)
RDW: 14.6 % (ref 11.5–15.5)
WBC: 4 10*3/uL (ref 4.0–10.5)

## 2024-03-03 LAB — LIPID PANEL
Cholesterol: 155 mg/dL (ref 0–200)
HDL: 77.3 mg/dL (ref >39.00)
LDL Cholesterol: 61 mg/dL (ref 0–99)
NonHDL: 77.77
Total CHOL/HDL Ratio: 2
Triglycerides: 86 mg/dL (ref 0.0–149.0)
VLDL: 17.2 mg/dL (ref 0.0–40.0)

## 2024-03-03 LAB — HEPATIC FUNCTION PANEL
ALT: 18 U/L (ref 0–35)
AST: 19 U/L (ref 0–37)
Albumin: 4.1 g/dL (ref 3.5–5.2)
Alkaline Phosphatase: 79 U/L (ref 39–117)
Bilirubin, Direct: 0.1 mg/dL (ref 0.0–0.3)
Total Bilirubin: 0.5 mg/dL (ref 0.2–1.2)
Total Protein: 6.2 g/dL (ref 6.0–8.3)

## 2024-03-03 LAB — TSH: TSH: 1.13 u[IU]/mL (ref 0.35–5.50)

## 2024-03-05 ENCOUNTER — Ambulatory Visit: Payer: Medicare PPO | Admitting: Internal Medicine

## 2024-03-05 VITALS — BP 128/70 | HR 70 | Temp 98.0°F | Resp 16 | Ht 67.0 in | Wt 210.6 lb

## 2024-03-05 DIAGNOSIS — I471 Supraventricular tachycardia, unspecified: Secondary | ICD-10-CM

## 2024-03-05 DIAGNOSIS — I700 Atherosclerosis of aorta: Secondary | ICD-10-CM

## 2024-03-05 DIAGNOSIS — K219 Gastro-esophageal reflux disease without esophagitis: Secondary | ICD-10-CM | POA: Diagnosis not present

## 2024-03-05 DIAGNOSIS — J4550 Severe persistent asthma, uncomplicated: Secondary | ICD-10-CM | POA: Diagnosis not present

## 2024-03-05 DIAGNOSIS — Z860100 Personal history of colon polyps, unspecified: Secondary | ICD-10-CM

## 2024-03-05 DIAGNOSIS — I4892 Unspecified atrial flutter: Secondary | ICD-10-CM | POA: Diagnosis not present

## 2024-03-05 DIAGNOSIS — E7800 Pure hypercholesterolemia, unspecified: Secondary | ICD-10-CM

## 2024-03-05 DIAGNOSIS — I10 Essential (primary) hypertension: Secondary | ICD-10-CM

## 2024-03-05 DIAGNOSIS — G35 Multiple sclerosis: Secondary | ICD-10-CM | POA: Diagnosis not present

## 2024-03-05 DIAGNOSIS — U071 COVID-19: Secondary | ICD-10-CM

## 2024-03-05 MED ORDER — PREDNISONE 10 MG PO TABS: ORAL_TABLET | ORAL | 0 refills | Status: DC

## 2024-03-05 MED ORDER — AZITHROMYCIN 250 MG PO TABS: ORAL_TABLET | ORAL | 0 refills | Status: AC

## 2024-03-05 NOTE — Progress Notes (Signed)
 Subjective:    Patient ID: Jackie White, female    DOB: 03/26/1956, 68 y.o.   MRN: [id number]  Patient here for  Chief Complaint  Patient presents with   Medical Management of Chronic Issues    HPI Here for a scheduled follow up.  Is s/p right knee surgery 04/02/23. Discharged 04/03/23 with home health PT. Was readmitted 04/06/23 - 04/07/23 - after presenting with sob and palpitations (heart rate 120-140). Per record review, was found to have new atrial flutter and subsegmental PE. Was started on eliquis . Atrial flutter resolved spontaneously during her hospitalization. Completed outpatient rehab. Evaluated by oncology - PE. Was treated with 3 month eliquis .  Saw cardiology 06/05/23. Echo showed normal LVEF, normal diastolic function, normal EVSF, trivial valve disease, positive saline study after valsalva suggestive of patent foramen ovale. Paroxysmal SVT noted on recent heart monitor.  Recommended continuing toprol . Off eliquis  now. Had f/u with Dr Lafe Pies 07/11/23 - f/u MS. MRI - stable. No new lesions. Continue ocrelizumab infusion. Had f/u with Dr Viva Grise 09/05/23 - stable on current regimen of tezspire , breo and airsupra . Had f/u with Dr Gollan 09/07/23 - stable. Recently returned from a [employer] and tested positive for covid. Symptoms started 02/27/24 - runny nose and cough. Symptoms have improved. No fever the last several days. Increased nasal and head congestion. Some chest congestion. Denies any sob. No vomiting or diarrhea. Eating. She had been given zpak for her trip. Did take the abx.    Past Medical History:  Diagnosis Date   Abnormal Pap smear    Cervical disc disease    Cervical and lumbar disc disease   COVID-19    02/15/22   GERD (gastroesophageal reflux disease)    History of iron deficiency    Hypertension    Multiple sclerosis (HCC)    Personal history of colonic polyps    Reactive airway disease    Vitamin D  deficiency    Past Surgical History:  Procedure  Laterality Date   BREAST REDUCTION SURGERY     COLONOSCOPY WITH PROPOFOL  N/A 03/16/2021   Procedure: COLONOSCOPY WITH PROPOFOL ;  Surgeon: Irby Mannan, MD;  Location: [facility] ENDOSCOPY;  Service: Endoscopy;  Laterality: N/A;   ESOPHAGOGASTRODUODENOSCOPY (EGD) WITH PROPOFOL  N/A 03/16/2021   Procedure: ESOPHAGOGASTRODUODENOSCOPY (EGD) WITH PROPOFOL ;  Surgeon: Irby Mannan, MD;  Location: [facility] ENDOSCOPY;  Service: Endoscopy;  Laterality: N/A;   REDUCTION MAMMAPLASTY Bilateral 2002   TOTAL ABDOMINAL HYSTERECTOMY  2003   Right Oophrectomy   Family History  Problem Relation Age of Onset   Stroke Mother    Hypertension Mother    Heart disease Maternal Grandmother    Breast cancer Neg Hx    Social History   Socioeconomic History   Marital status: Married    Spouse name: Not on file   Number of children: Not on file   Years of education: Not on file   Highest education level: Bachelor's degree (e.g., BA, AB, BS)  Occupational History   Not on file  Tobacco Use   Smoking status: Never   Smokeless tobacco: Never  Vaping Use   Vaping status: Never Used  Substance and Sexual Activity   Alcohol use: Yes    Alcohol/week: 0.0 standard drinks of alcohol   Drug use: No   Sexual activity: Not on file  Other Topics Concern   Not on file  Social History Narrative   Not on file   Social Drivers of Health   Financial Resource Strain: Low  Risk  (10/21/2023)   Overall Financial Resource Strain (CARDIA)    Difficulty of Paying Living Expenses: Not hard at all  Food Insecurity: No Food Insecurity (10/21/2023)   Hunger Vital Sign    Worried About Running Out of Food in the Last Year: Never true    Ran Out of Food in the Last Year: Never true  Transportation Needs: No Transportation Needs (01/10/2024)   PRAPARE - Administrator, Civil Service (Medical): No    Lack of Transportation (Non-Medical): No  Physical Activity: Insufficiently Active (01/10/2024)   Exercise Vital  Sign    Days of Exercise per Week: 2 days    Minutes of Exercise per Session: 30 min  Stress: No Stress Concern Present (01/10/2024)   [employer] of Occupational Health - Occupational Stress Questionnaire    Feeling of Stress : Not at all  Social Connections: Socially Integrated (01/10/2024)   Social Connection and Isolation Panel [NHANES]    Frequency of Communication with Friends and Family: Twice a week    Frequency of Social Gatherings with Friends and Family: Once a week    Attends Religious Services: 1 to 4 times per year    Active Member of [employer] or Organizations: Yes    Attends Engineer, structural: More than 4 times per year    Marital Status: Married     Review of Systems  Constitutional:  Negative for appetite change, fever and unexpected weight change.  HENT:  Positive for congestion and postnasal drip.   Respiratory:  Positive for cough. Negative for chest tightness and shortness of breath.   Cardiovascular:  Negative for chest pain, palpitations and leg swelling.  Gastrointestinal:  Negative for abdominal pain, diarrhea, nausea and vomiting.  Genitourinary:  Negative for difficulty urinating and dysuria.  Musculoskeletal:  Negative for joint swelling and myalgias.  Skin:  Negative for color change and rash.  Neurological:  Negative for dizziness and headaches.  Psychiatric/Behavioral:  Negative for agitation and dysphoric mood.        Objective:     BP 128/70   Pulse 70   Temp 98 F (36.7 C)   Resp 16   Ht 5\' 7"  (1.702 m)   Wt 210 lb 9.6 oz (95.5 kg)   LMP 12/27/2000   SpO2 99%   BMI 32.98 kg/m  Wt Readings from Last 3 Encounters:  03/05/24 210 lb 9.6 oz (95.5 kg)  10/25/23 213 lb 12.8 oz (97 kg)  09/07/23 212 lb (96.2 kg)    Physical Exam Vitals reviewed.  Constitutional:      General: She is not in acute distress.    Appearance: Normal appearance.  HENT:     Head: Normocephalic and atraumatic.     Right Ear: External ear normal.      Left Ear: External ear normal.     Mouth/Throat:     Pharynx: No oropharyngeal exudate or posterior oropharyngeal erythema.  Eyes:     General: No scleral icterus.       Right eye: No discharge.        Left eye: No discharge.     Conjunctiva/sclera: Conjunctivae normal.  Neck:     Thyroid : No thyromegaly.  Cardiovascular:     Rate and Rhythm: Normal rate and regular rhythm.  Pulmonary:     Effort: No respiratory distress.     Breath sounds: Normal breath sounds. No wheezing.  Abdominal:     General: Bowel sounds are normal.  Palpations: Abdomen is soft.     Tenderness: There is no abdominal tenderness.  Musculoskeletal:        General: No swelling or tenderness.     Cervical back: Neck supple. No tenderness.  Lymphadenopathy:     Cervical: No cervical adenopathy.  Skin:    Findings: No erythema or rash.  Neurological:     Mental Status: She is alert.  Psychiatric:        Mood and Affect: Mood normal.        Behavior: Behavior normal.         Outpatient Encounter Medications as of 03/05/2024  Medication Sig   azithromycin  (ZITHROMAX ) 250 MG tablet Take 2 tablets on day 1, then 1 tablet daily on days 2 through 5   predniSONE  (DELTASONE ) 10 MG tablet Take 4 tablets x 1 day and then decrease by 1/2 tablet per day until down to zero mg.   Albuterol -Budesonide (AIRSUPRA ) 90-80 MCG/ACT AERO Inhale 2 puffs into the lungs every 4 (four) hours as needed (Shortness of breath, wheezing).   BREO ELLIPTA  200-25 MCG/ACT AEPB INHALE 1 PUFF EVERY DAY   cetirizine (ZYRTEC) 10 MG tablet Take by mouth.   ciclopirox  (PENLAC ) 8 % solution Apply topically at bedtime. Apply over nail and surrounding skin. Apply daily over previous coat. After seven (7) days, may remove with alcohol and continue cycle.   esomeprazole  (NEXIUM ) 40 MG capsule TAKE 1 CAPSULE(40 MG) BY MOUTH DAILY   metoprolol  succinate (TOPROL -XL) 25 MG 24 hr tablet Take 1 tablet (25 mg total) by mouth daily.   Ocrelizumab  (OCREVUS IV) Inject into the vein.   rosuvastatin  (CRESTOR ) 5 MG tablet Take 1 tablet (5 mg total) by mouth every Monday, Wednesday, and Friday.   terbinafine  (LAMISIL ) 250 MG tablet Take 1 tablet (250 mg total) by mouth daily.   Tezepelumab -ekko (TEZSPIRE ) 210 MG/1. SOAJ Inject 210 mg into the skin every 28 (twenty-eight) days.   valsartan -hydrochlorothiazide  (DIOVAN -HCT) 320-25 MG tablet Take 1 tablet by mouth daily.   [DISCONTINUED] amoxicillin -clavulanate (AUGMENTIN ) 875-125 MG tablet Take 1 tablet by mouth 2 (two) times daily.   [DISCONTINUED] benzonatate  (TESSALON  PERLES) 100 MG capsule Take 1 capsule (100 mg total) by mouth 3 (three) times daily as needed.   No facility-administered encounter medications on file as of 03/05/2024.     Lab Results  Component Value Date   WBC 4.0 03/03/2024   HGB 13.2 03/03/2024   HCT 40.1 03/03/2024   PLT 249.0 03/03/2024   GLUCOSE 89 03/03/2024   CHOL 155 03/03/2024   TRIG 86.0 03/03/2024   HDL 77.30 03/03/2024   LDLCALC 61 03/03/2024   ALT 18 03/03/2024   AST 19 03/03/2024   NA 141 03/03/2024   K 4.4 03/03/2024   CL 103 03/03/2024   CREATININE 0.70 03/03/2024   BUN 7 03/03/2024   CO2 28 03/03/2024   TSH 1.13 03/03/2024    MM 3D SCREENING MAMMOGRAM BILATERAL BREAST Result Date: 11/16/2023 CLINICAL DATA:  Screening. EXAM: DIGITAL SCREENING BILATERAL MAMMOGRAM WITH TOMOSYNTHESIS AND CAD TECHNIQUE: Bilateral screening digital craniocaudal and mediolateral oblique mammograms were obtained. Bilateral screening digital breast tomosynthesis was performed. The images were evaluated with computer-aided detection. COMPARISON:  Previous exam(s). ACR Breast Density Category a: The breasts are almost entirely fatty. FINDINGS: There are no findings suspicious for malignancy. IMPRESSION: No mammographic evidence of malignancy. A result letter of this screening mammogram will be mailed directly to the patient. RECOMMENDATION: Screening mammogram in one  year. (Code:SM-B-01Y) BI-RADS CATEGORY  1: Negative. Electronically Signed   By: Alinda Apley M.D.   On: 11/16/2023 16:54       Assessment & Plan:  Aortic atherosclerosis (HCC) Assessment & Plan: Continue crestor .    Hypercholesterolemia Assessment & Plan: Continue crestor . Low cholesterol diet and exercise. Follow lipid panel and liver function tests.   Lab Results  Component Value Date   CHOL 155 03/03/2024   HDL 77.30 03/03/2024   LDLCALC 61 03/03/2024   TRIG 86.0 03/03/2024   CHOLHDL 2 03/03/2024     Orders: -     Lipid panel; Future -     Hepatic function panel; Future  Essential hypertension Assessment & Plan: Continue metoprolol  and diovan /hctz.  Follow pressures.  Follow metabolic panel.   Orders: -     Basic metabolic panel with GFR; Future  Atrial flutter, unspecified type [facility]) Assessment & Plan: During recent hospitalization was found to be in aflutter.  Aflutter resolved spontaneously.  CT - positive for LLL subsegmental pulmonary embolus. She was discharged with heart monitor.   Denies any increased heart rate or palpitations currently.  Saw cardiology 06/05/23. Echo showed normal LVEF, normal diastolic function, normal EVSF, trivial valve disease, positive saline study after valsalva suggestive of patent foramen ovale. Paroxysmal SVT noted on recent heart monitor.  Recommended continuing toprol .  Planning f/u with cardiology.  Patent foramen ovale - Dr Jerelene Monday reviewed - felt like small PFO - incidental finding.  No further w/up warranted.  Did not feel aspirin warranted after eliquis . Off eliquis .  Doing well. Appears to be in SR today.    Gastroesophageal reflux disease, unspecified whether esophagitis present Assessment & Plan: No acid reflux reported.  On nexium . EGD -03/16/21 - reactive gastritis. No upper symptoms reported. Continue nexium .    History of colon polyps Assessment & Plan: Colonoscopy 03/16/21 - hemorrhoids and diverticulosis.     Multiple sclerosis (HCC) Assessment & Plan: Followed by neurology for MS.  Appears to be stable on ocrevus.  . Had f/u with Dr Lafe Pies 07/11/23 - f/u MS. MRI - stable. No new lesions. Continue ocrelizumab infusion. Currently stable. Follow.    PSVT (paroxysmal supraventricular tachycardia) [facility]) Assessment & Plan: Saw cardiology 05/2023 - on metoprolol . Stable.    Severe persistent asthma, unspecified whether complicated Assessment & Plan: Dr Viva Grise - Continue Tezspire . Continue Singulair  and Breo Ellipta .  Treat current infection as outlined. No increased sob.    COVID-19 virus infection Assessment & Plan: Recent diagnosis of covid. Continue inhalers as she is doing. Increased cough. Continue mucinex . Start prednisone  taper as directed. Follow. Call with update.    Other orders -     predniSONE ; Take 4 tablets x 1 day and then decrease by 1/2 tablet per day until down to zero mg.  Dispense: 18 tablet; Refill: 0 -     Azithromycin ; Take 2 tablets on day 1, then 1 tablet daily on days 2 through 5  Dispense: 6 tablet; Refill: 0     Dellar Fenton, MD

## 2024-03-05 NOTE — Assessment & Plan Note (Signed)
Continue metoprolol and diovan/hctz.  Follow pressures.  Follow metabolic panel.

## 2024-03-09 ENCOUNTER — Encounter: Payer: Self-pay | Admitting: Internal Medicine

## 2024-03-09 DIAGNOSIS — U071 COVID-19: Secondary | ICD-10-CM | POA: Insufficient documentation

## 2024-03-09 NOTE — Assessment & Plan Note (Signed)
 Followed by neurology for MS.  Appears to be stable on ocrevus.  . Had f/u with Dr Lafe Pies 07/11/23 - f/u MS. MRI - stable. No new lesions. Continue ocrelizumab infusion. Currently stable. Follow.

## 2024-03-09 NOTE — Assessment & Plan Note (Signed)
 Continue crestor . Low cholesterol diet and exercise. Follow lipid panel and liver function tests.   Lab Results  Component Value Date   CHOL 155 03/03/2024   HDL 77.30 03/03/2024   LDLCALC 61 03/03/2024   TRIG 86.0 03/03/2024   CHOLHDL 2 03/03/2024

## 2024-03-09 NOTE — Assessment & Plan Note (Signed)
 Continue crestor

## 2024-03-09 NOTE — Assessment & Plan Note (Signed)
 Recent diagnosis of covid. Continue inhalers as she is doing. Increased cough. Continue mucinex . Start prednisone  taper as directed. Follow. Call with update.

## 2024-03-09 NOTE — Assessment & Plan Note (Signed)
 During recent hospitalization was found to be in aflutter.  Aflutter resolved spontaneously.  CT - positive for LLL subsegmental pulmonary embolus. She was discharged with heart monitor.   Denies any increased heart rate or palpitations currently.  Saw cardiology 06/05/23. Echo showed normal LVEF, normal diastolic function, normal EVSF, trivial valve disease, positive saline study after valsalva suggestive of patent foramen ovale. Paroxysmal SVT noted on recent heart monitor.  Recommended continuing toprol .  Planning f/u with cardiology.  Patent foramen ovale - Dr Jerelene Monday reviewed - felt like small PFO - incidental finding.  No further w/up warranted.  Did not feel aspirin warranted after eliquis . Off eliquis .  Doing well. Appears to be in SR today.

## 2024-03-09 NOTE — Assessment & Plan Note (Signed)
 No acid reflux reported.  On nexium . EGD -03/16/21 - reactive gastritis. No upper symptoms reported. Continue nexium .

## 2024-03-09 NOTE — Assessment & Plan Note (Signed)
 Saw cardiology 05/2023 - on metoprolol. Stable.

## 2024-03-09 NOTE — Assessment & Plan Note (Signed)
 Dr Viva Grise - Continue Tezspire . Continue Singulair  and Breo Ellipta .  Treat current infection as outlined. No increased sob.

## 2024-03-09 NOTE — Assessment & Plan Note (Signed)
Colonoscopy 03/16/21 - hemorrhoids and diverticulosis.

## 2024-04-16 DIAGNOSIS — Z96651 Presence of right artificial knee joint: Secondary | ICD-10-CM | POA: Diagnosis not present

## 2024-04-16 DIAGNOSIS — Z471 Aftercare following joint replacement surgery: Secondary | ICD-10-CM | POA: Diagnosis not present

## 2024-05-09 ENCOUNTER — Ambulatory Visit: Admitting: Pulmonary Disease

## 2024-05-09 ENCOUNTER — Encounter: Payer: Self-pay | Admitting: Pulmonary Disease

## 2024-05-09 VITALS — BP 124/80 | HR 57 | Temp 97.9°F | Ht 67.0 in | Wt 209.4 lb

## 2024-05-09 DIAGNOSIS — J4551 Severe persistent asthma with (acute) exacerbation: Secondary | ICD-10-CM

## 2024-05-09 DIAGNOSIS — G35 Multiple sclerosis: Secondary | ICD-10-CM | POA: Diagnosis not present

## 2024-05-09 LAB — NITRIC OXIDE: Nitric Oxide: 154

## 2024-05-09 MED ORDER — METHYLPREDNISOLONE 4 MG PO TBPK: ORAL_TABLET | ORAL | 0 refills | Status: DC

## 2024-05-09 NOTE — Patient Instructions (Signed)
 VISIT SUMMARY:  Today, we discussed your asthma management and the recent increase in your symptoms, including wheezing and coughing. We also reviewed your knee replacement from last year and your plans for future knee surgery.  YOUR PLAN:  -ASTHMA WITH INCREASED INFLAMMATION: Your asthma symptoms have worsened recently, likely due to environmental factors like particulates and mold. This has caused an increase in airway inflammation. We will start you on a Medrol  Dose Pack to help control the inflammation.  While you are on the Medrol  Dosepak please hold your regular prednisone  dose.  You may resume it once your Medrol  Dosepak is completed.  Continue using your Tezspire  medication as it is working well with your current treatment plan. Please monitor your symptoms closely and use MyChart to communicate any concerns directly with our office. We will reassess your symptoms in 4-6 weeks.  -KNEE REPLACEMENT: You have recovered well from your knee replacement surgery last year. You mentioned that you need the other knee replaced but plan to wait for now. We will continue to monitor your condition and discuss future plans as needed.  INSTRUCTIONS:  Please follow up in 4-6 weeks to reassess your asthma symptoms. Use MyChart to communicate any concerns or changes in your condition directly with our office.

## 2024-05-09 NOTE — Progress Notes (Signed)
 Subjective:    Patient ID: Jackie White, female    DOB: 21-Dec-1955, 68 y.o.   MRN: [id number]  Patient Care Team: Glendia Shad, MD as PCP - General (Internal Medicine) Tamea Dedra CROME, MD as Consulting Physician (Pulmonary Disease)  Chief Complaint  Patient presents with   Follow-up    Occasional cough.     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  I last saw the patient on 05 September 2023. She was started on Tezspire  in June 2024.  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  She is on Eliquis .   HPI Discussed the use of AI scribe software for clinical note transcription with the patient, who gave verbal consent to proceed.  History of Present Illness   Jackie White is a 68 year old female with asthma who presents for follow-up on asthma management.  She has experienced increased wheezing and coughing over the past few days, attributing these symptoms to weather changes and environmental factors such as particulates in the air. The sputum is described as 'a little yellowish'. No fever or other systemic symptoms are present.  She has not had any fevers, chills or sweats.  No chest pain.  No hemoptysis.  Her inflammation levels have been recorded as 181, 117, and today 154, indicating a rise in airway inflammation. She continues to use her Tezspire  medication monthly, which has helped keep her asthma controlled. She is also on Ocrevus  for her multiple sclerosis, and the Tezspire  does not interfere with this medication.  She underwent a knee replacement a year ago and had a yearly follow-up, which was positive. She mentions needing the other knee replaced but plans to wait on that.     Review of Systems A 10  point review of systems was performed and it is as noted above otherwise negative.   Patient Active Problem List   Diagnosis Date Noted   COVID-19 virus infection 03/09/2024   URI with cough and congestion 01/20/2024   Thumb pain, left 10/25/2023   Thickened nails 07/25/2023   Wrist pain 07/25/2023   PSVT (paroxysmal supraventricular tachycardia) (HCC) 06/07/2023   Patent foramen ovale 06/07/2023   History of right knee surgery 04/15/2023   Pulmonary embolus (HCC) 04/15/2023   Atrial flutter (HCC) 04/15/2023   Pre-op evaluation 03/18/2023   Nasal congestion 10/23/2022   Severe persistent asthma 09/28/2022   Gas bloat syndrome 07/23/2022   History of COVID-19 03/09/2022   Eosinophilic asthma 11/17/2021   Eosinophilia 10/10/2021   History of colon polyps 03/10/2021   History of iron deficiency 03/10/2021   PUD (peptic ulcer disease) 03/10/2021   Aortic atherosclerosis (HCC) 01/24/2021   Skin lesion 09/19/2020   Cough variant asthma 11/19/2019   Abnormal EKG 02/21/2019   Family history of coronary artery disease 02/21/2019   Cough 01/05/2019   Shortness of breath 01/05/2019   Wheezing 07/21/2018   Impingement syndrome of left shoulder region 05/01/2017   Osteoarthritis of knee 05/01/2017   Trochanteric bursitis of right hip 05/01/2017   Left shoulder pain 04/24/2017   Right knee pain 04/24/2017   Hemorrhoids 08/02/2016   Low back pain 05/17/2016   Right hip pain 05/17/2016   Chest tightness 03/01/2016   Rash 01/31/2015   Health care maintenance 01/31/2015  Left knee pain 10/12/2013   Pain in joint involving lower leg 10/12/2013   GERD (gastroesophageal reflux disease) 12/29/2012   Abnormal Pap smear of cervix 12/29/2012   Essential hypertension 12/25/2012   Anemia 12/25/2012   Degeneration of lumbar or lumbosacral intervertebral disc 12/25/2012   Hypercholesterolemia 12/25/2012   Vitamin D  deficiency 12/25/2012   Multiple sclerosis (HCC) 12/25/2012    Social  History   Tobacco Use   Smoking status: Never   Smokeless tobacco: Never  Substance Use Topics   Alcohol use: Yes    Alcohol/week: 0.0 standard drinks of alcohol    No Known Allergies  Current Meds  Medication Sig   Albuterol -Budesonide (AIRSUPRA ) 90-80 MCG/ACT AERO Inhale 2 puffs into the lungs every 4 (four) hours as needed (Shortness of breath, wheezing).   cetirizine (ZYRTEC) 10 MG tablet Take by mouth.   ciclopirox  (PENLAC ) 8 % solution Apply topically at bedtime. Apply over nail and surrounding skin. Apply daily over previous coat. After seven (7) days, may remove with alcohol and continue cycle.   esomeprazole  (NEXIUM ) 40 MG capsule TAKE 1 CAPSULE(40 MG) BY MOUTH DAILY   methylPREDNISolone  (MEDROL  DOSEPAK) 4 MG TBPK tablet Take as directed on the package.  This is a taper pack.   metoprolol  succinate (TOPROL -XL) 25 MG 24 hr tablet Take 1 tablet (25 mg total) by mouth daily.   Ocrelizumab  (OCREVUS  IV) Inject into the vein.   predniSONE  (DELTASONE ) 10 MG tablet Take 4 tablets x 1 day and then decrease by 1/2 tablet per day until down to zero mg. (Patient taking differently: as needed. Take 4 tablets x 1 day and then decrease by 1/2 tablet per day until down to zero mg.)   rosuvastatin  (CRESTOR ) 5 MG tablet Take 1 tablet (5 mg total) by mouth every Monday, Wednesday, and Friday.   terbinafine  (LAMISIL ) 250 MG tablet Take 1 tablet (250 mg total) by mouth daily.   Tezepelumab -ekko (TEZSPIRE ) 210 MG/1. SOAJ Inject 210 mg into the skin every 28 (twenty-eight) days.   valsartan -hydrochlorothiazide  (DIOVAN -HCT) 320-25 MG tablet Take 1 tablet by mouth daily.    Immunization History  Administered Date(s) Administered   Influenza Split 09/26/2012, 09/24/2013   Influenza, High Dose Seasonal PF 09/08/2019, 08/30/2023   Influenza,inj,Quad PF,6+ Mos 09/14/2020, 11/10/2021   Influenza-Unspecified 08/27/2014, 09/21/2015, 09/20/2016, 09/10/2018, 09/20/2022   PFIZER(Purple Top)SARS-COV-2  Vaccination 02/10/2020, 03/02/2020, 11/16/2020, 03/18/2021   Pfizer Covid-19 Vaccine Bivalent Booster 68yrs & up 08/25/2021   Zoster Recombinant(Shingrix) 09/11/2019, 11/12/2019        Objective:     BP 124/80 (BP Location: Left Arm, Patient Position: Sitting, Cuff Size: Normal)   Pulse (!) 57   Temp 97.9 F (36.6 C) (Oral)   Ht 5' 7 (1.702 m)   Wt 209 lb 6.4 oz (95 kg)   LMP 12/27/2000   SpO2 100%   BMI 32.80 kg/m   SpO2: 100 %  GENERAL: Overweight woman, no acute distress.  Fully ambulatory.  No conversational dyspnea.  No respiratory distress. HEAD: Normocephalic, atraumatic.   EYES: Pupils equal, round, reactive to light.  No scleral icterus. MOUTH: Dentition intact, oral mucosa moist.  No thrush. NECK: Supple. No thyromegaly. Trachea midline. No JVD.  No adenopathy. PULMONARY: Good air entry bilaterally.  No adventitious sounds noted.   CARDIOVASCULAR: S1 and S2. Regular rate and rhythm.  No rubs, murmurs or gallops heard. ABDOMEN: Benign. MUSCULOSKELETAL: No joint deformity, no clubbing, no edema. NEUROLOGIC: No focal deficit, no gait disturbance, speech is fluent. SKIN: Intact,warm,dry.  No  rashes. PSYCH: Mood and behavior normal.  Lab Results  Component Value Date   NITRICOXIDE 154 05/09/2024  *There is evidence of type II inflammation *Trend: 181>>133>>117>>154 ppb       Assessment & Plan:     ICD-10-CM   1. Severe persistent asthma with acute exacerbation  J45.51 Nitric oxide     2. Multiple sclerosis (HCC)  G35       Orders Placed This Encounter  Procedures   Nitric oxide     Meds ordered this encounter  Medications   methylPREDNISolone  (MEDROL  DOSEPAK) 4 MG TBPK tablet    Sig: Take as directed on the package.  This is a taper pack.    Dispense:  21 tablet    Refill:  0   Discussion:    Severe persistent asthma with increased inflammation/exacerbation Asthma exacerbation with increased inflammation, likely triggered by environmental  factors such as particulates and mold. Inflammation level has increased from 117 to 154. Eosinophils are well controlled, indicating effective asthma management. Wheezing and productive cough with yellowish sputum present, but no bacterial infection indicated, so antibiotics are not required. Current asthma management with prior is maintained due to its compatibility with Ocrevus .  Jackie White may also be compatible, may need to consider switch if inflammatory markers continue to be elevated - Prescribe Medrol  Dose Pack for inflammation control - Hold regular prednisone  dose while on Medrol  Dosepak - Continue current asthma management with Tezspire  - Advise her to monitor symptoms and use MyChart for direct communication with the office - Follow-up in 4-6 weeks to reassess symptoms  Knee replacement Status post knee replacement one year ago with good recovery. She reports needing the other knee replaced but plans to wait.    Advised if symptoms do not improve or worsen, to please contact office for sooner follow up or seek emergency care.    I spent 33 minutes of dedicated to the care of this patient on the date of this encounter to include pre-visit review of records, face-to-face time with the patient discussing conditions above, post visit ordering of testing, clinical documentation with the electronic health record, making appropriate referrals as documented, and communicating necessary findings to members of the patients care team.     C. Leita Sanders, MD Advanced Bronchoscopy PCCM [facility]    *This note was generated using voice recognition software/Dragon and/or AI transcription program.  Despite best efforts to proofread, errors can occur which can change the meaning. Any transcriptional errors that result from this process are unintentional and may not be fully corrected at the time of dictation.

## 2024-05-10 ENCOUNTER — Encounter: Payer: Self-pay | Admitting: Pulmonary Disease

## 2024-06-04 ENCOUNTER — Other Ambulatory Visit: Payer: Self-pay | Admitting: Pulmonary Disease

## 2024-06-04 DIAGNOSIS — J4550 Severe persistent asthma, uncomplicated: Secondary | ICD-10-CM

## 2024-06-04 NOTE — Telephone Encounter (Signed)
 Refill sent for TEZSPIRE  to [facility]: [phone]  Dose: 210mg  subcut every 4 weeks  Last OV: 05/09/2024 Provider: Dr. Tamea  Next OV: 06/12/24  Sherry Pennant, PharmD, MPH, BCPS Clinical Pharmacist (Rheumatology and Pulmonology)

## 2024-06-12 ENCOUNTER — Ambulatory Visit: Admitting: Pulmonary Disease

## 2024-06-12 ENCOUNTER — Encounter: Payer: Self-pay | Admitting: Pulmonary Disease

## 2024-06-12 VITALS — BP 110/68 | HR 55 | Temp 97.5°F | Ht 67.0 in | Wt 207.4 lb

## 2024-06-12 DIAGNOSIS — G35 Multiple sclerosis: Secondary | ICD-10-CM

## 2024-06-12 DIAGNOSIS — J4551 Severe persistent asthma with (acute) exacerbation: Secondary | ICD-10-CM

## 2024-06-12 DIAGNOSIS — D721 Eosinophilia, unspecified: Secondary | ICD-10-CM | POA: Diagnosis not present

## 2024-06-12 LAB — NITRIC OXIDE: Nitric Oxide: 97

## 2024-06-12 MED ORDER — METHYLPREDNISOLONE 4 MG PO TBPK: ORAL_TABLET | ORAL | 0 refills | Status: DC

## 2024-06-12 NOTE — Progress Notes (Signed)
 Subjective:    Patient ID: Jackie White, female    DOB: 1956-09-19, 68 y.o.   MRN: [id number]  Patient Care Team: Glendia Shad, MD as PCP - General (Internal Medicine) Tamea Dedra CROME, MD as Consulting Physician (Pulmonary Disease)  Chief Complaint  Patient presents with   Follow-up    Cough with yellow sputum in the mornings. DOE. Wheezing.     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  I last saw the patient on 09 May 2024. She was started on Tezspire  in June 2024.  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  No other admissions since then.  She has completed Eliquis  course.  HPI Discussed the use of AI scribe software for clinical note transcription with the patient, who gave verbal consent to proceed.  History of Present Illness   Jackie White is a 68 year old female with severe persistent asthma who presents with increased wheezing and chest tightness.  She experiences increased chest tightness and difficulty breathing when she is exposed to high heat and humidity, describing the sensation of 'something laying on my chest.'  This is usually relieved by going indoors.  Her asthma management includes [employer], used once or twice a week, and Breo. She has experienced fewer exacerbations since starting this regimen.  She has a history of using prednisone  during exacerbations. She has not been using prednisone  recently as she has been feeling markedly better.  Nitric oxide  determination today showed 97 ppb compared to 154 previously.     Review of Systems A 10 point review of systems was performed and it is as noted above otherwise negative.   Patient Active Problem List   Diagnosis Date  Noted   COVID-19 virus infection 03/09/2024   URI with cough and congestion 01/20/2024   Thumb pain, left 10/25/2023   Thickened nails 07/25/2023   Wrist pain 07/25/2023   PSVT (paroxysmal supraventricular tachycardia) (HCC) 06/07/2023   Patent foramen ovale 06/07/2023   History of right knee surgery 04/15/2023   Pulmonary embolus (HCC) 04/15/2023   Atrial flutter (HCC) 04/15/2023   Pre-op evaluation 03/18/2023   Nasal congestion 10/23/2022   Severe persistent asthma 09/28/2022   Gas bloat syndrome 07/23/2022   History of COVID-19 03/09/2022   Eosinophilic asthma 11/17/2021   Eosinophilia 10/10/2021   History of colon polyps 03/10/2021   History of iron deficiency 03/10/2021   PUD (peptic ulcer disease) 03/10/2021   Aortic atherosclerosis (HCC) 01/24/2021   Skin lesion 09/19/2020   Cough variant asthma 11/19/2019   Abnormal EKG 02/21/2019   Family history of coronary artery disease 02/21/2019   Cough 01/05/2019   Shortness of breath 01/05/2019   Wheezing 07/21/2018   Impingement syndrome of left shoulder region 05/01/2017   Osteoarthritis of knee 05/01/2017   Trochanteric bursitis of right hip 05/01/2017   Left shoulder pain 04/24/2017   Right knee pain 04/24/2017   Hemorrhoids 08/02/2016   Low back pain 05/17/2016   Right hip pain 05/17/2016   Chest tightness 03/01/2016   Rash 01/31/2015   Health care maintenance 01/31/2015   Left knee pain 10/12/2013   Pain in joint involving lower leg 10/12/2013   GERD (gastroesophageal reflux disease) 12/29/2012   Abnormal Pap smear of cervix 12/29/2012  Essential hypertension 12/25/2012   Anemia 12/25/2012   Degeneration of lumbar or lumbosacral intervertebral disc 12/25/2012   Hypercholesterolemia 12/25/2012   Vitamin D  deficiency 12/25/2012   Multiple sclerosis (HCC) 12/25/2012    Social History   Tobacco Use   Smoking status: Never   Smokeless tobacco: Never  Substance Use Topics   Alcohol use: Yes    Alcohol/week:  0.0 standard drinks of alcohol    No Known Allergies  Current Meds  Medication Sig   Albuterol -Budesonide (AIRSUPRA ) 90-80 MCG/ACT AERO Inhale 2 puffs into the lungs every 4 (four) hours as needed (Shortness of breath, wheezing).   BREO ELLIPTA  200-25 MCG/ACT AEPB INHALE 1 PUFF EVERY DAY   cetirizine (ZYRTEC) 10 MG tablet Take by mouth.   ciclopirox  (PENLAC ) 8 % solution Apply topically at bedtime. Apply over nail and surrounding skin. Apply daily over previous coat. After seven (7) days, may remove with alcohol and continue cycle.   esomeprazole  (NEXIUM ) 40 MG capsule TAKE 1 CAPSULE(40 MG) BY MOUTH DAILY   methylPREDNISolone  (MEDROL  DOSEPAK) 4 MG TBPK tablet Take as directed on the package.  This is a taper pack.   metoprolol  succinate (TOPROL -XL) 25 MG 24 hr tablet Take 1 tablet (25 mg total) by mouth daily.   Ocrelizumab  (OCREVUS  IV) Inject into the vein.   predniSONE  (DELTASONE ) 10 MG tablet Take 4 tablets x 1 day and then decrease by 1/2 tablet per day until down to zero mg. (Patient taking differently: as needed. Take 4 tablets x 1 day and then decrease by 1/2 tablet per day until down to zero mg.)   rosuvastatin  (CRESTOR ) 5 MG tablet Take 1 tablet (5 mg total) by mouth every Monday, Wednesday, and Friday.   terbinafine  (LAMISIL ) 250 MG tablet Take 1 tablet (250 mg total) by mouth daily.   TEZSPIRE  210 MG/1. SOAJ INJECT 1 PEN UNDER THE SKIN EVERY 28 DAYS   valsartan -hydrochlorothiazide  (DIOVAN -HCT) 320-25 MG tablet Take 1 tablet by mouth daily.    Immunization History  Administered Date(s) Administered   Influenza Split 09/26/2012, 09/24/2013   Influenza, High Dose Seasonal PF 09/08/2019, 08/30/2023   Influenza,inj,Quad PF,6+ Mos 09/14/2020, 11/10/2021   Influenza-Unspecified 08/27/2014, 09/21/2015, 09/20/2016, 09/10/2018, 09/20/2022   PFIZER(Purple Top)SARS-COV-2 Vaccination 02/10/2020, 03/02/2020, 11/16/2020, 03/18/2021   Pfizer Covid-19 Vaccine Bivalent Booster 88yrs & up  08/25/2021   Zoster Recombinant(Shingrix) 09/11/2019, 11/12/2019      Objective:     BP 110/68 (BP Location: Right Arm, Cuff Size: Large)   Pulse (!) 55   Temp (!) 97.5 F (36.4 C)   Ht 5' 7 (1.702 m)   Wt 207 lb 6.4 oz (94.1 kg)   LMP 12/27/2000   SpO2 97%   BMI 32.48 kg/m   SpO2: 97 % O2 Device: None (Room air)  GENERAL: Overweight woman, no acute distress.  Fully ambulatory.  No conversational dyspnea.  No respiratory distress. HEAD: Normocephalic, atraumatic.   EYES: Pupils equal, round, reactive to light.  No scleral icterus. MOUTH: Dentition intact, oral mucosa moist.  No thrush. NECK: Supple. No thyromegaly. Trachea midline. No JVD.  No adenopathy. PULMONARY: Good air entry bilaterally.  No adventitious sounds noted.   CARDIOVASCULAR: S1 and S2. Regular rate and rhythm.  No rubs, murmurs or gallops heard. ABDOMEN: Benign. MUSCULOSKELETAL: No joint deformity, no clubbing, no edema. NEUROLOGIC: No focal deficit, no gait disturbance, speech is fluent. SKIN: Intact,warm,dry.  No rashes. PSYCH: Mood and behavior normal.  Lab Results  Component Value Date   NITRICOXIDE 97 06/12/2024  *Type  II inflammation present, decreasing trend *Trend: 181>>133>>117>>154>>97 ppb     Assessment & Plan:     ICD-10-CM   1. Severe persistent asthma with acute exacerbation  J45.51 Nitric oxide     2. Multiple sclerosis (HCC)  G35     3. Eosinophilia, unspecified type  D72.10       Orders Placed This Encounter  Procedures   Nitric oxide     Meds ordered this encounter  Medications   methylPREDNISolone  (MEDROL  DOSEPAK) 4 MG TBPK tablet    Sig: Take as directed on the package.  This is a taper pack.    Dispense:  21 tablet    Refill:  0   Discussion:    Severe persistent asthma Severe persistent asthma exacerbated by current weather conditions with increased wheezing, though inflammation is decreasing. Fewer exacerbations since starting current treatment regimen. Air  Supra effective, used once or twice a week. Goal is to manage symptoms without frequent prednisone  use due to side effects. - Continue current asthma management regimen. - Use Air Supra inhaler twice daily for a few weeks during current weather conditions. - Continue Breo as prescribed. - Prescribe Medrol  taper pack for if symptoms worsen. - Instruct to notify if Medrol  taper pack is initiated. - Schedule follow-up appointment in 4 to 6 weeks. - [employer] is covered by insurance; provide coupon if needed.    Advised if symptoms do not improve or worsen, to please contact office for sooner follow up or seek emergency care.    I spent 41 minutes of dedicated to the care of this patient on the date of this encounter to include pre-visit review of records, face-to-face time with the patient discussing conditions above, post visit ordering of testing, clinical documentation with the electronic health record, making appropriate referrals as documented, and communicating necessary findings to members of the patients care team.     C. Leita Sanders, MD Advanced Bronchoscopy PCCM [facility]    *This note was generated using voice recognition software/Dragon and/or AI transcription program.  Despite best efforts to proofread, errors can occur which can change the meaning. Any transcriptional errors that result from this process are unintentional and may not be fully corrected at the time of dictation.

## 2024-06-12 NOTE — Patient Instructions (Addendum)
 VISIT SUMMARY:  Today, we discussed your severe persistent asthma, which has been exacerbated by the current weather conditions. You reported increased wheezing and chest tightness, but noted that your inflammation is decreasing. We reviewed your current asthma management plan and made some adjustments to help manage your symptoms more effectively.  YOUR PLAN:  -SEVERE PERSISTENT ASTHMA: Severe persistent asthma is a long-term condition where your airways are always inflamed and can become more swollen and narrow when something triggers your symptoms. Your current symptoms are being worsened by the weather. Continue using your Air Supra inhaler twice daily for a few weeks during the current weather conditions and continue taking Breo as prescribed. A medrol  taper pack has been prescribed for you to use if your symptoms worsen, but please notify us  if you need to start using it. We will schedule a follow-up appointment in 4 to 6 weeks to monitor your progress.  INSTRUCTIONS:  Please schedule a follow-up appointment in 4 to 6 weeks. If you need to start using the prednisone  taper pack, notify us  immediately. Ensure that your [employer] inhaler is covered by your insurance, and let us  know if you need a coupon.

## 2024-06-13 ENCOUNTER — Other Ambulatory Visit: Payer: Self-pay | Admitting: Pulmonary Disease

## 2024-06-30 ENCOUNTER — Ambulatory Visit (INDEPENDENT_AMBULATORY_CARE_PROVIDER_SITE_OTHER): Admitting: *Deleted

## 2024-06-30 VITALS — Ht 67.0 in | Wt 205.0 lb

## 2024-06-30 DIAGNOSIS — Z0000 Encounter for general adult medical examination without abnormal findings: Secondary | ICD-10-CM

## 2024-06-30 DIAGNOSIS — Z1159 Encounter for screening for other viral diseases: Secondary | ICD-10-CM | POA: Diagnosis not present

## 2024-06-30 NOTE — Patient Instructions (Addendum)
 Jackie White , Thank you for taking time out of your busy schedule to complete your Annual Wellness Visit with me. I enjoyed our conversation and look forward to speaking with you again next year. I, as well as your care team,  appreciate your ongoing commitment to your health goals. Please review the following plan we discussed and let me know if I can assist you in the future. Your Game plan/ To Do List    Referrals: If you haven't heard from the office you've been referred to, please reach out to them at the phone provided.  A lab order has been placed for a Hepatitis C screening. Remember to check and find out if you have had Tetanus (Tdap) and pneumonia vaccines. Remember to get a flu and covid vaccine annually.   Follow up Visits: We will see or speak with you next year for your Next Medicare AWV with our clinical staff 07/03/25 @ 11:30 Have you seen your provider in the last 6 months (3 months if uncontrolled diabetes)? Yes  Clinician Recommendations:  Aim for 30 minutes of exercise or brisk walking, 6-8 glasses of water, and 5 servings of fruits and vegetables each day.       This is a list of the screenings recommended for you:  Health Maintenance  Topic Date Due   Hepatitis C Screening  Never done   DTaP/Tdap/Td vaccine (1 - Tdap) Never done   DEXA scan (bone density measurement)  05/04/2023   COVID-19 Vaccine (7 - Pfizer risk 2024-25 season) 02/28/2024   Flu Shot  06/20/2024   Pneumococcal Vaccine for age over 79 (1 of 2 - PCV) 03/05/2025*   Medicare Annual Wellness Visit  06/30/2025   Mammogram  11/11/2025   Colon Cancer Screening  03/17/2031   Zoster (Shingles) Vaccine  Completed   Hepatitis B Vaccine  Aged Out   HPV Vaccine  Aged Out   Meningitis B Vaccine  Aged Out  *Topic was postponed. The date shown is not the original due date.    Advanced directives: (ACP Link)Information on Advanced Care Planning can be found at [place]  Secretary of [facility] Advance Health Care  Directives Advance Health Care Directives. [email or web address]  Advance Care Planning is important because it:  [x]  Makes sure you receive the medical care that is consistent with your values, goals, and preferences  [x]  It provides guidance to your family and loved ones and reduces their decisional burden about whether or not they are making the right decisions based on your wishes.  Follow the link provided in your after visit summary or read over the paperwork we have mailed to you to help you started getting your Advance Directives in place. If you need assistance in completing these, please reach out to us  so that we can help you!

## 2024-06-30 NOTE — Progress Notes (Signed)
 Subjective:   Jackie White is a 68 y.o. who presents for a Medicare Wellness preventive visit.  As a reminder, Annual Wellness Visits don't include a physical exam, and some assessments may be limited, especially if this visit is performed virtually. We may recommend an in-person follow-up visit with your provider if needed.  Visit Complete: Virtual I connected with  Jackie White on 06/30/24 by a audio enabled telemedicine application and verified that I am speaking with the correct person using two identifiers.  Patient Location: Home  Provider Location: Home Office  I discussed the limitations of evaluation and management by telemedicine. The patient expressed understanding and agreed to proceed.  Vital Signs: Because this visit was a virtual/telehealth visit, some criteria may be missing or patient reported. Any vitals not documented were not able to be obtained and vitals that have been documented are patient reported.  VideoDeclined- This patient declined Librarian, academic. Therefore the visit was completed with audio only.  Persons Participating in Visit: Patient.  AWV Questionnaire: Yes: Patient Medicare AWV questionnaire was completed by the patient on 06/26/24; I have confirmed that all information answered by patient is correct and no changes since this date.  Cardiac Risk Factors include: advanced age (>31men, >106 women);dyslipidemia;hypertension;obesity (BMI >30kg/m2)     Objective:    Today's Vitals   06/30/24 1133  Weight: 205 lb (93 kg)  Height: 5' 7 (1.702 m)   Body mass index is 32.11 kg/m.     06/30/2024   11:45 AM 04/19/2023    2:15 PM 03/16/2021   10:02 AM 12/24/2018    6:22 PM 05/21/2017    9:09 AM 01/16/2017    2:15 PM 06/08/2016    8:57 AM  Advanced Directives  Does Patient Have a Medical Advance Directive? No No No No  No  Yes  No   Would patient like information on creating a medical advance directive? No - Patient  declined  No - Patient declined  No - Patient declined        Data saved with a previous flowsheet row definition    Current Medications (verified) Outpatient Encounter Medications as of 06/30/2024  Medication Sig   AIRSUPRA  90-80 MCG/ACT AERO INHALE 2 PUFFS INTO THE LUNGS EVERY 4 HOURS AS NEEDED FOR SHORTNESS OF BREATH OR WHEEZING   BREO ELLIPTA  200-25 MCG/ACT AEPB INHALE 1 PUFF EVERY DAY   cetirizine (ZYRTEC) 10 MG tablet Take by mouth.   ciclopirox  (PENLAC ) 8 % solution Apply topically at bedtime. Apply over nail and surrounding skin. Apply daily over previous coat. After seven (7) days, may remove with alcohol and continue cycle.   esomeprazole  (NEXIUM ) 40 MG capsule TAKE 1 CAPSULE(40 MG) BY MOUTH DAILY   methylPREDNISolone  (MEDROL  DOSEPAK) 4 MG TBPK tablet Take as directed on the package.  This is a taper pack.   metoprolol  succinate (TOPROL -XL) 25 MG 24 hr tablet Take 1 tablet (25 mg total) by mouth daily.   Ocrelizumab  (OCREVUS  IV) Inject into the vein.   predniSONE  (DELTASONE ) 10 MG tablet Take 4 tablets x 1 day and then decrease by 1/2 tablet per day until down to zero mg.   rosuvastatin  (CRESTOR ) 5 MG tablet Take 1 tablet (5 mg total) by mouth every Monday, Wednesday, and Friday.   terbinafine  (LAMISIL ) 250 MG tablet Take 1 tablet (250 mg total) by mouth daily.   TEZSPIRE  210 MG/1. SOAJ INJECT 1 PEN UNDER THE SKIN EVERY 28 DAYS   valsartan -hydrochlorothiazide  (DIOVAN -HCT) 320-25  MG tablet Take 1 tablet by mouth daily.   No facility-administered encounter medications on file as of 06/30/2024.    Allergies (verified) Patient has no known allergies.   History: Past Medical History:  Diagnosis Date   Abnormal Pap smear    Cervical disc disease    Cervical and lumbar disc disease   COVID-19    02/15/22   GERD (gastroesophageal reflux disease)    History of iron deficiency    Hypertension    Multiple sclerosis (HCC)    Personal history of colonic polyps    Reactive  airway disease    Vitamin D  deficiency    Past Surgical History:  Procedure Laterality Date   BREAST REDUCTION SURGERY     COLONOSCOPY WITH PROPOFOL  N/A 03/16/2021   Procedure: COLONOSCOPY WITH PROPOFOL ;  Surgeon: Janalyn Keene NOVAK, MD;  Location: [facility] END[facility]COPY;  Service: Endoscopy;  Laterality: N/A;   ESOPHAGOGASTRODUODENOSCOPY (EGD) WITH PROPOFOL  N/A 03/16/2021   Procedure: ESOPHAGOGASTRODUODENOSCOPY (EGD) WITH PROPOFOL ;  Surgeon: Janalyn Keene NOVAK, MD;  Location: [facility] ENDOSCOPY;  Service: Endoscopy;  Laterality: N/A;   REDUCTION MAMMAPLASTY Bilateral 2002   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   TOTAL ABDOMINAL HYSTERECTOMY  2003   Right Oophrectomy   Family History  Problem Relation Age of Onset   Stroke Mother    Hypertension Mother    Heart disease Maternal Grandmother    Breast cancer Neg Hx    Social History   Socioeconomic History   Marital status: Married    Spouse name: Not on file   Number of children: Not on file   Years of education: Not on file   Highest education level: Bachelor's degree (e.g., BA, AB, BS)  Occupational History   Not on file  Tobacco Use   Smoking status: Never   Smokeless tobacco: Never  Vaping Use   Vaping status: Never Used  Substance and Sexual Activity   Alcohol use: Yes    Alcohol/week: 0.0 standard drinks of alcohol   Drug use: No   Sexual activity: Not on file  Other Topics Concern   Not on file  Social History Narrative   married   Social Drivers of Health   Financial Resource Strain: Low Risk  (06/26/2024)   Overall Financial Resource Strain (CARDIA)    Difficulty of Paying Living Expenses: Not hard at all  Food Insecurity: No Food Insecurity (06/26/2024)   Hunger Vital Sign    Worried About Running Out of Food in the Last Year: Never true    Ran Out of Food in the Last Year: Never true  Transportation Needs: No Transportation Needs (06/26/2024)   PRAPARE - Administrator, Civil Service (Medical): No     Lack of Transportation (Non-Medical): No  Physical Activity: Insufficiently Active (06/26/2024)   Exercise Vital Sign    Days of Exercise per Week: 2 days    Minutes of Exercise per Session: 20 min  Stress: No Stress Concern Present (06/26/2024)   [employer] of Occupational Health - Occupational Stress Questionnaire    Feeling of Stress: Not at all  Social Connections: Socially Integrated (06/26/2024)   Social Connection and Isolation Panel    Frequency of Communication with Friends and Family: Three times a week    Frequency of Social Gatherings with Friends and Family: Once a week    Attends Religious Services: 1 to 4 times per year    Active Member of [employer] or Organizations: Yes    Attends Banker Meetings:  More than 4 times per year    Marital Status: Married    Tobacco Counseling Counseling given: Not Answered    Clinical Intake:  Pre-visit preparation completed: Yes  Pain : No/denies pain     BMI - recorded: 32.11 Nutritional Status: BMI > 30  Obese Nutritional Risks: None Diabetes: No  No results found for: HGBA1C   How often do you need to have someone help you when you read instructions, pamphlets, or other written materials from your doctor or pharmacy?: 1 - Never  Interpreter Needed?: No  Information entered by :: R. Yana Schorr LPN   Activities of Daily Living     06/26/2024    9:46 AM  In your present state of health, do you have any difficulty performing the following activities:  Hearing? 0  Vision? 0  Difficulty concentrating or making decisions? 0  Walking or climbing stairs? 1  Dressing or bathing? 0  Doing errands, shopping? 0  Preparing Food and eating ? N  Using the Toilet? N  In the past six months, have you accidently leaked urine? N  Do you have problems with loss of bowel control? N  Managing your Medications? N  Managing your Finances? N  Housekeeping or managing your Housekeeping? N    Patient Care Team: Glendia Shad, MD as PCP - General (Internal Medicine) Tamea Dedra CROME, MD as Consulting Physician (Pulmonary Disease)  I have updated your Care Teams any recent Medical Services you may have received from other providers in the past year.     Assessment:   This is a routine wellness examination for Jackie White.  Hearing/Vision screen Hearing Screening - Comments:: No issues Vision Screening - Comments:: glasses   Goals Addressed             This Visit's Progress    Patient Stated       Wants to lose some weight and stay active        Depression Screen     06/30/2024   11:40 AM 03/05/2024    9:38 AM 07/25/2023   11:33 AM 04/19/2023    2:12 PM 04/13/2023    4:18 PM 10/18/2022   10:26 AM 10/05/2021   10:03 AM  PHQ 2/9 Scores  PHQ - 2 Score 0 0 0 0 0 0 0  PHQ- 9 Score 0          Fall Risk     06/26/2024    9:46 AM 03/05/2024    9:38 AM 07/25/2023   11:33 AM 04/13/2023    4:18 PM 01/22/2023   10:21 AM  Fall Risk   Falls in the past year? 0 0 0 0 0  Number falls in past yr: 0 0 0 0 0  Injury with Fall? 0 0 0 0 0  Risk for fall due to : No Fall Risks No Fall Risks No Fall Risks No Fall Risks No Fall Risks  Follow up Falls evaluation completed;Falls prevention discussed Falls evaluation completed Falls evaluation completed Falls evaluation completed Falls evaluation completed    MEDICARE RISK AT HOME:  Medicare Risk at Home Any stairs in or around the home?: (Patient-Rptd) Yes If so, are there any without handrails?: (Patient-Rptd) No Home free of loose throw rugs in walkways, pet beds, electrical cords, etc?: (Patient-Rptd) Yes Adequate lighting in your home to reduce risk of falls?: (Patient-Rptd) Yes Life alert?: (Patient-Rptd) No Use of a cane, walker or w/c?: (Patient-Rptd) No Grab bars in the bathroom?: (Patient-Rptd) No Shower  chair or bench in shower?: (Patient-Rptd) No Elevated toilet seat or a handicapped toilet?: (Patient-Rptd) Yes  TIMED UP AND GO:  Was the test  performed?  No  Cognitive Function: 6CIT completed        06/30/2024   11:45 AM  6CIT Screen  What Year? 0 points  What month? 0 points  What time? 0 points  Count back from 20 0 points  Months in reverse 0 points  Repeat phrase 0 points  Total Score 0 points    Immunizations Immunization History  Administered Date(s) Administered   Influenza Split 09/26/2012, 09/24/2013   Influenza, High Dose Seasonal PF 09/08/2019, 08/30/2023   Influenza,inj,Quad PF,6+ Mos 09/14/2020, 11/10/2021   Influenza-Unspecified 08/27/2014, 09/21/2015, 09/20/2016, 09/10/2018, 09/20/2022   PFIZER(Purple Top)SARS-COV-2 Vaccination 02/10/2020, 03/02/2020, 11/16/2020, 03/18/2021   Pfizer Covid-19 Vaccine Bivalent Booster 9yrs & up 08/25/2021   Zoster Recombinant(Shingrix) 09/11/2019, 11/12/2019    Screening Tests Health Maintenance  Topic Date Due   Medicare Annual Wellness (AWV)  Never done   Hepatitis C Screening  Never done   DTaP/Tdap/Td (1 - Tdap) Never done   DEXA SCAN  05/04/2023   COVID-19 Vaccine (6 - 2024-25 season) 07/22/2023   INFLUENZA VACCINE  06/20/2024   Pneumococcal Vaccine: 50+ Years (1 of 2 - PCV) 03/05/2025 (Originally 05/03/1975)   MAMMOGRAM  11/11/2025   Colonoscopy  03/17/2031   Zoster Vaccines- Shingrix  Completed   Hepatitis B Vaccines  Aged Out   HPV VACCINES  Aged Out   Meningococcal B Vaccine  Aged Out    Health Maintenance  Health Maintenance Due  Topic Date Due   Medicare Annual Wellness (AWV)  Never done   Hepatitis C Screening  Never done   DTaP/Tdap/Td (1 - Tdap) Never done   DEXA SCAN  05/04/2023   COVID-19 Vaccine (6 - 2024-25 season) 07/22/2023   INFLUENZA VACCINE  06/20/2024   Health Maintenance Items Addressed: Labs Ordered: Hepatitis C, Discussed the need to update Tetanus (Tdap) and pneumonia vaccines which patient thinks that she has had and will get dates.. Patient declines Dexa at this time. Patient reminded to get a flu and covid vaccine  annually  Additional Screening:  Vision Screening: Recommended annual ophthalmology exams for early detection of glaucoma and other disorders of the eye. Up to date [facility] Would you like a referral to an eye doctor? No    Dental Screening: Recommended annual dental exams for proper oral hygiene  Community Resource Referral / Chronic Care Management: CRR required this visit?  No   CCM required this visit?  No   Plan:    I have personally reviewed and noted the following in the patient's chart:   Medical and social history Use of alcohol, tobacco or illicit drugs  Current medications and supplements including opioid prescriptions. Patient is not currently taking opioid prescriptions. Functional ability and status Nutritional status Physical activity Advanced directives List of other physicians Hospitalizations, surgeries, and ER visits in previous 12 months Vitals Screenings to include cognitive, depression, and falls Referrals and appointments  In addition, I have reviewed and discussed with patient certain preventive protocols, quality metrics, and best practice recommendations. A written personalized care plan for preventive services as well as general preventive health recommendations were provided to patient.   Angeline Fredericks, LPN   [id number]   After Visit Summary: (MyChart) Due to this being a telephonic visit, the after visit summary with patients personalized plan was offered to patient via MyChart   Notes: Nothing significant  to report at this time.

## 2024-07-03 ENCOUNTER — Other Ambulatory Visit

## 2024-07-04 ENCOUNTER — Ambulatory Visit: Payer: Medicare PPO | Admitting: Podiatry

## 2024-07-07 ENCOUNTER — Other Ambulatory Visit (INDEPENDENT_AMBULATORY_CARE_PROVIDER_SITE_OTHER)

## 2024-07-07 ENCOUNTER — Ambulatory Visit: Payer: Self-pay | Admitting: Internal Medicine

## 2024-07-07 DIAGNOSIS — I10 Essential (primary) hypertension: Secondary | ICD-10-CM

## 2024-07-07 DIAGNOSIS — E7800 Pure hypercholesterolemia, unspecified: Secondary | ICD-10-CM | POA: Diagnosis not present

## 2024-07-07 LAB — BASIC METABOLIC PANEL WITH GFR
BUN: 10 mg/dL (ref 6–23)
CO2: 33 meq/L — ABNORMAL HIGH (ref 19–32)
Calcium: 9.2 mg/dL (ref 8.4–10.5)
Chloride: 102 meq/L (ref 96–112)
Creatinine, Ser: 0.78 mg/dL (ref 0.40–1.20)
GFR: 79.28 mL/min (ref >60.00)
Glucose, Bld: 88 mg/dL (ref 70–99)
Potassium: 4.4 meq/L (ref 3.5–5.1)
Sodium: 143 meq/L (ref 135–145)

## 2024-07-07 LAB — HEPATIC FUNCTION PANEL
ALT: 11 U/L (ref 0–35)
AST: 14 U/L (ref 0–37)
Albumin: 4.2 g/dL (ref 3.5–5.2)
Alkaline Phosphatase: 78 U/L (ref 39–117)
Bilirubin, Direct: 0.1 mg/dL (ref 0.0–0.3)
Total Bilirubin: 0.5 mg/dL (ref 0.2–1.2)
Total Protein: 6.2 g/dL (ref 6.0–8.3)

## 2024-07-07 LAB — LIPID PANEL
Cholesterol: 163 mg/dL (ref 0–200)
HDL: 86.3 mg/dL (ref >39.00)
LDL Cholesterol: 65 mg/dL (ref 0–99)
NonHDL: 76.81
Total CHOL/HDL Ratio: 2
Triglycerides: 57 mg/dL (ref 0.0–149.0)
VLDL: 11.4 mg/dL (ref 0.0–40.0)

## 2024-07-08 ENCOUNTER — Ambulatory Visit: Admitting: Internal Medicine

## 2024-07-08 VITALS — BP 126/72 | HR 60 | Resp 16 | Ht 67.0 in | Wt 208.0 lb

## 2024-07-08 DIAGNOSIS — K219 Gastro-esophageal reflux disease without esophagitis: Secondary | ICD-10-CM | POA: Diagnosis not present

## 2024-07-08 DIAGNOSIS — J4550 Severe persistent asthma, uncomplicated: Secondary | ICD-10-CM

## 2024-07-08 DIAGNOSIS — Z1159 Encounter for screening for other viral diseases: Secondary | ICD-10-CM | POA: Diagnosis not present

## 2024-07-08 DIAGNOSIS — I4892 Unspecified atrial flutter: Secondary | ICD-10-CM | POA: Diagnosis not present

## 2024-07-08 DIAGNOSIS — E7800 Pure hypercholesterolemia, unspecified: Secondary | ICD-10-CM

## 2024-07-08 DIAGNOSIS — E559 Vitamin D deficiency, unspecified: Secondary | ICD-10-CM | POA: Diagnosis not present

## 2024-07-08 DIAGNOSIS — I10 Essential (primary) hypertension: Secondary | ICD-10-CM | POA: Diagnosis not present

## 2024-07-08 DIAGNOSIS — G35 Multiple sclerosis: Secondary | ICD-10-CM

## 2024-07-08 DIAGNOSIS — I700 Atherosclerosis of aorta: Secondary | ICD-10-CM

## 2024-07-08 DIAGNOSIS — I471 Supraventricular tachycardia, unspecified: Secondary | ICD-10-CM

## 2024-07-08 MED ORDER — ESOMEPRAZOLE MAGNESIUM 40 MG PO CPDR: DELAYED_RELEASE_CAPSULE | ORAL | 3 refills | Status: AC

## 2024-07-08 MED ORDER — VALSARTAN-HYDROCHLOROTHIAZIDE 320-25 MG PO TABS: 1.0000 | ORAL_TABLET | Freq: Every day | ORAL | 3 refills | Status: AC

## 2024-07-08 MED ORDER — ROSUVASTATIN CALCIUM 5 MG PO TABS: 5.0000 mg | ORAL_TABLET | ORAL | 3 refills | Status: AC

## 2024-07-08 MED ORDER — METOPROLOL SUCCINATE ER 25 MG PO TB24: 25.0000 mg | ORAL_TABLET | Freq: Every day | ORAL | 3 refills | Status: AC

## 2024-07-08 NOTE — Progress Notes (Signed)
 Subjective:    Patient ID: Jackie White, female    DOB: 02/13/1956, 68 y.o.   MRN: [id number]  Patient here for  Chief Complaint  Patient presents with   Medical Management of Chronic Issues    HPI Here for a scheduled follow up. Is s/p right knee surgery 04/02/23. Was readmitted 04/06/23 - 04/07/23 - after presenting with sob and palpitations (heart rate 120-140). Per record review, was found to have new atrial flutter and subsegmental PE. Was started on eliquis . Atrial flutter resolved spontaneously during her hospitalization. Completed outpatient rehab. Evaluated by oncology - PE. Was treated with 3 month eliquis . Saw cardiology 06/05/23. Echo showed normal LVEF, normal diastolic function, normal EVSF, trivial valve disease, positive saline study after valsalva suggestive of patent foramen ovale. Paroxysmal SVT noted on recent heart monitor. Recommended continuing toprol . Off eliquis  now. Had f/u with Dr Jasmine 07/11/23 - f/u MS. MRI - stable. No new lesions. Continue ocrelizumab  infusion. Had f/u with pulmonary 06/12/24 - f/u severe persistent asthma - continue air supra, breo. Given medrol  dose pack to take prn. Takes Breo in the am and air supra q day to every other day. Over the last few weeks, has noticed some increased sob. Used her nebulizer - did open her up. Increased productive phlegm. No increased acid reflux. Runny nose. Discussed bone density. Notify when agreeable to schedule.    Past Medical History:  Diagnosis Date   Abnormal Pap smear    Cervical disc disease    Cervical and lumbar disc disease   COVID-19    02/15/22   GERD (gastroesophageal reflux disease)    History of iron deficiency    Hypertension    Multiple sclerosis (HCC)    Personal history of colonic polyps    Reactive airway disease    Vitamin D  deficiency    Past Surgical History:  Procedure Laterality Date   BREAST REDUCTION SURGERY     COLONOSCOPY WITH PROPOFOL  N/A 03/16/2021   Procedure: COLONOSCOPY  WITH PROPOFOL ;  Surgeon: Janalyn Keene NOVAK, MD;  Location: [facility]COPY;  Service: Endoscopy;  Laterality: N/A;   ESOPHAGOGASTRODUODENOSCOPY (EGD) WITH PROPOFOL  N/A 03/16/2021   Procedure: ESOPHAGOGASTRODUODENOSCOPY (EGD) WITH PROPOFOL ;  Surgeon: Janalyn Keene NOVAK, MD;  Location: [facility] ENDOSCOPY;  Service: Endoscopy;  Laterality: N/A;   REDUCTION MAMMAPLASTY Bilateral 2002   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   TOTAL ABDOMINAL HYSTERECTOMY  2003   Right Oophrectomy   Family History  Problem Relation Age of Onset   Stroke Mother    Hypertension Mother    Heart disease Maternal Grandmother    Breast cancer Neg Hx    Social History   Socioeconomic History   Marital status: Married    Spouse name: Not on file   Number of children: Not on file   Years of education: Not on file   Highest education level: Bachelor's degree (e.g., BA, AB, BS)  Occupational History   Not on file  Tobacco Use   Smoking status: Never   Smokeless tobacco: Never  Vaping Use   Vaping status: Never Used  Substance and Sexual Activity   Alcohol use: Yes    Alcohol/week: 0.0 standard drinks of alcohol   Drug use: No   Sexual activity: Not on file  Other Topics Concern   Not on file  Social History Narrative   married   Social Drivers of Health   Financial Resource Strain: Low Risk  (06/26/2024)   Overall Financial Resource Strain (CARDIA)  Difficulty of Paying Living Expenses: Not hard at all  Food Insecurity: No Food Insecurity (06/26/2024)   Hunger Vital Sign    Worried About Running Out of Food in the Last Year: Never true    Ran Out of Food in the Last Year: Never true  Transportation Needs: No Transportation Needs (06/26/2024)   PRAPARE - Administrator, Civil Service (Medical): No    Lack of Transportation (Non-Medical): No  Physical Activity: Insufficiently Active (06/26/2024)   Exercise Vital Sign    Days of Exercise per Week: 2 days    Minutes of Exercise per Session: 20  min  Stress: No Stress Concern Present (06/26/2024)   [employer] of Occupational Health - Occupational Stress Questionnaire    Feeling of Stress: Not at all  Social Connections: Socially Integrated (06/26/2024)   Social Connection and Isolation Panel    Frequency of Communication with Friends and Family: Three times a week    Frequency of Social Gatherings with Friends and Family: Once a week    Attends Religious Services: 1 to 4 times per year    Active Member of [employer] or Organizations: Yes    Attends Engineer, structural: More than 4 times per year    Marital Status: Married     Review of Systems  Constitutional:  Negative for appetite change, fever and unexpected weight change.  HENT:  Positive for congestion and postnasal drip.   Respiratory:  Negative for chest tightness.        Some increased sob noticed recently. Productive phlegm.   Cardiovascular:  Negative for chest pain, palpitations and leg swelling.  Gastrointestinal:  Negative for abdominal pain, diarrhea, nausea and vomiting.  Genitourinary:  Negative for difficulty urinating and dysuria.  Musculoskeletal:  Negative for joint swelling and myalgias.  Skin:  Negative for color change and rash.  Neurological:  Negative for dizziness and headaches.  Psychiatric/Behavioral:  Negative for agitation and dysphoric mood.        Objective:     BP 126/72   Pulse 60   Resp 16   Ht 5' 7 (1.702 m)   Wt 208 lb (94.3 kg)   LMP 12/27/2000   SpO2 99%   BMI 32.58 kg/m  Wt Readings from Last 3 Encounters:  07/11/24 208 lb (94.3 kg)  07/08/24 208 lb (94.3 kg)  06/30/24 205 lb (93 kg)    Physical Exam Vitals reviewed.  Constitutional:      General: She is not in acute distress.    Appearance: Normal appearance.  HENT:     Head: Normocephalic and atraumatic.     Right Ear: External ear normal.     Left Ear: External ear normal.  Eyes:     General: No scleral icterus.       Right eye: No discharge.         Left eye: No discharge.     Conjunctiva/sclera: Conjunctivae normal.  Neck:     Thyroid : No thyromegaly.  Cardiovascular:     Rate and Rhythm: Normal rate and regular rhythm.  Pulmonary:     Effort: No respiratory distress.     Comments: Increased air movement. No increased wheezing. Some minimal cough with forced expiration.  Abdominal:     General: Bowel sounds are normal.     Palpations: Abdomen is soft.     Tenderness: There is no abdominal tenderness.  Musculoskeletal:        General: No swelling or tenderness.  Cervical back: Neck supple. No tenderness.  Lymphadenopathy:     Cervical: No cervical adenopathy.  Skin:    Findings: No erythema or rash.  Neurological:     Mental Status: She is alert.  Psychiatric:        Mood and Affect: Mood normal.        Behavior: Behavior normal.         Outpatient Encounter Medications as of 07/08/2024  Medication Sig   AIRSUPRA  90-80 MCG/ACT AERO INHALE 2 PUFFS INTO THE LUNGS EVERY 4 HOURS AS NEEDED FOR SHORTNESS OF BREATH OR WHEEZING   BREO ELLIPTA  200-25 MCG/ACT AEPB INHALE 1 PUFF EVERY DAY   cetirizine (ZYRTEC) 10 MG tablet Take by mouth.   ciclopirox  (PENLAC ) 8 % solution Apply topically at bedtime. Apply over nail and surrounding skin. Apply daily over previous coat. After seven (7) days, may remove with alcohol and continue cycle.   esomeprazole  (NEXIUM ) 40 MG capsule TAKE 1 CAPSULE(40 MG) BY MOUTH DAILY   methylPREDNISolone  (MEDROL  DOSEPAK) 4 MG TBPK tablet Take as directed on the package.  This is a taper pack.   metoprolol  succinate (TOPROL -XL) 25 MG 24 hr tablet Take 1 tablet (25 mg total) by mouth daily.   Ocrelizumab  (OCREVUS  IV) Inject into the vein.   predniSONE  (DELTASONE ) 10 MG tablet Take 4 tablets x 1 day and then decrease by 1/2 tablet per day until down to zero mg.   rosuvastatin  (CRESTOR ) 5 MG tablet Take 1 tablet (5 mg total) by mouth every Monday, Wednesday, and Friday.   terbinafine  (LAMISIL ) 250 MG tablet  Take 1 tablet (250 mg total) by mouth daily.   TEZSPIRE  210 MG/1. SOAJ INJECT 1 PEN UNDER THE SKIN EVERY 28 DAYS   valsartan -hydrochlorothiazide  (DIOVAN -HCT) 320-25 MG tablet Take 1 tablet by mouth daily.   [DISCONTINUED] esomeprazole  (NEXIUM ) 40 MG capsule TAKE 1 CAPSULE(40 MG) BY MOUTH DAILY   [DISCONTINUED] metoprolol  succinate (TOPROL -XL) 25 MG 24 hr tablet Take 1 tablet (25 mg total) by mouth daily.   [DISCONTINUED] rosuvastatin  (CRESTOR ) 5 MG tablet Take 1 tablet (5 mg total) by mouth every Monday, Wednesday, and Friday.   [DISCONTINUED] valsartan -hydrochlorothiazide  (DIOVAN -HCT) 320-25 MG tablet Take 1 tablet by mouth daily.   No facility-administered encounter medications on file as of 07/08/2024.     Lab Results  Component Value Date   WBC 4.0 03/03/2024   HGB 13.2 03/03/2024   HCT 40.1 03/03/2024   PLT 249.0 03/03/2024   GLUCOSE 88 07/07/2024   CHOL 163 07/07/2024   TRIG 57.0 07/07/2024   HDL 86.30 07/07/2024   LDLCALC 65 07/07/2024   ALT 11 07/07/2024   AST 14 07/07/2024   NA 143 07/07/2024   K 4.4 07/07/2024   CL 102 07/07/2024   CREATININE 0.78 07/07/2024   BUN 10 07/07/2024   CO2 33 (H) 07/07/2024   TSH 1.13 03/03/2024    MM 3D SCREENING MAMMOGRAM BILATERAL BREAST Result Date: 11/16/2023 CLINICAL DATA:  Screening. EXAM: DIGITAL SCREENING BILATERAL MAMMOGRAM WITH TOMOSYNTHESIS AND CAD TECHNIQUE: Bilateral screening digital craniocaudal and mediolateral oblique mammograms were obtained. Bilateral screening digital breast tomosynthesis was performed. The images were evaluated with computer-aided detection. COMPARISON:  Previous exam(s). ACR Breast Density Category a: The breasts are almost entirely fatty. FINDINGS: There are no findings suspicious for malignancy. IMPRESSION: No mammographic evidence of malignancy. A result letter of this screening mammogram will be mailed directly to the patient. RECOMMENDATION: Screening mammogram in one year. (Code:SM-B-01Y) BI-RADS  CATEGORY  1: Negative. Electronically Signed  By: Rosaline Collet M.D.   On: 11/16/2023 16:54       Assessment & Plan:  Need for hepatitis C screening test -     Hepatitis C antibody; Future  Hypercholesterolemia Assessment & Plan: Continue crestor . Low cholesterol diet and exercise. Follow lipid panel.  Lab Results  Component Value Date   CHOL 163 07/07/2024   HDL 86.30 07/07/2024   LDLCALC 65 07/07/2024   TRIG 57.0 07/07/2024   CHOLHDL 2 07/07/2024     Orders: -     Lipid panel; Future -     Hepatic function panel; Future -     CBC with Differential/Platelet; Future  Essential hypertension Assessment & Plan: Continue metoprolol  and diovan /hctz.  Follow pressures.  Follow metabolic panel.  No changes in medication today.   Orders: -     Basic metabolic panel with GFR; Future  Vitamin D  deficiency Assessment & Plan: Check vitamin D  level with next labs.   Orders: -     VITAMIN D  25 Hydroxy (Vit-D Deficiency, Fractures); Future  Severe persistent asthma, unspecified whether complicated Assessment & Plan: Has been seeing Dr Tamea - continue tezspire . Has noticed some increased sob the last few weeks. Continue breo scheduled. Use air supra - scheduled during the day. No increased acid reflux. Does report some runny nose and increased phlegm. Steroid nasal spray and saline nasal spray as directed. Has rx for prednisone . Low threshold to start. Try above measures and if persistent - start prednisone . F/u with Dr Tamea.    PSVT (paroxysmal supraventricular tachycardia) [facility]) Assessment & Plan: Saw cardiology 05/2023 - on metoprolol . Stable.    Multiple sclerosis (HCC) Assessment & Plan: Followed by neurology for MS.  Appears to be stable on ocrevus .  . Had f/u with Dr Jasmine 07/11/23 - f/u MS. MRI - stable. No new lesions. Continue ocrelizumab  infusion. Currently stable. Keep f/u appt with neurology.    Gastroesophageal reflux disease, unspecified whether  esophagitis present Assessment & Plan: Acid reflux controlled. Continue nexium .    Atrial flutter, unspecified type [facility]) Assessment & Plan: During recent hospitalization was found to be in aflutter.  Aflutter resolved spontaneously.  CT - positive for LLL subsegmental pulmonary embolus. She was discharged with heart monitor.   Denies any increased heart rate or palpitations currently.  Saw cardiology 06/05/23. Echo showed normal LVEF, normal diastolic function, normal EVSF, trivial valve disease, positive saline study after valsalva suggestive of patent foramen ovale. Paroxysmal SVT noted on recent heart monitor.  Recommended continuing toprol .  Planning f/u with cardiology.  Patent foramen ovale - Dr Perla reviewed - felt like small PFO - incidental finding.  No further w/up warranted.  Did not feel aspirin warranted after eliquis . Off eliquis .  Doing well. Appears to be in SR today.    Aortic atherosclerosis (HCC) Assessment & Plan: Continue crestor .    Other orders -     Esomeprazole  Magnesium ; TAKE 1 CAPSULE(40 MG) BY MOUTH DAILY  Dispense: 90 capsule; Refill: 3 -     Metoprolol  Succinate ER; Take 1 tablet (25 mg total) by mouth daily.  Dispense: 90 tablet; Refill: 3 -     Rosuvastatin  Calcium ; Take 1 tablet (5 mg total) by mouth every Monday, Wednesday, and Friday.  Dispense: 39 tablet; Refill: 3 -     Valsartan -hydroCHLOROthiazide ; Take 1 tablet by mouth daily.  Dispense: 90 tablet; Refill: 3     Allena Hamilton, MD

## 2024-07-10 DIAGNOSIS — G35 Multiple sclerosis: Secondary | ICD-10-CM | POA: Diagnosis not present

## 2024-07-10 DIAGNOSIS — Z79899 Other long term (current) drug therapy: Secondary | ICD-10-CM | POA: Diagnosis not present

## 2024-07-11 ENCOUNTER — Ambulatory Visit: Admitting: Podiatry

## 2024-07-11 ENCOUNTER — Encounter: Payer: Self-pay | Admitting: Podiatry

## 2024-07-11 VITALS — Ht 67.0 in | Wt 208.0 lb

## 2024-07-11 DIAGNOSIS — B351 Tinea unguium: Secondary | ICD-10-CM

## 2024-07-11 NOTE — Progress Notes (Signed)
   Chief Complaint  Patient presents with   Nail Problem    Pt is here to f/u on bilateral toenails due to fungus, she states some change to discoloration of toenails, stop taking medication prescribe and using cream only as of now.    Subjective: 68 y.o. female presenting today for follow-up evaluation of thickening with discoloration of the bilateral toenails.  Overall there is some improvement.  Past Medical History:  Diagnosis Date   Abnormal Pap smear    Cervical disc disease    Cervical and lumbar disc disease   COVID-19    02/15/22   GERD (gastroesophageal reflux disease)    History of iron deficiency    Hypertension    Multiple sclerosis (HCC)    Personal history of colonic polyps    Reactive airway disease    Vitamin D  deficiency     Past Surgical History:  Procedure Laterality Date   BREAST REDUCTION SURGERY     COLONOSCOPY WITH PROPOFOL  N/A 03/16/2021   Procedure: COLONOSCOPY WITH PROPOFOL ;  Surgeon: Janalyn Keene NOVAK, MD;  Location: [facility] ENDOSCOPY;  Service: Endoscopy;  Laterality: N/A;   ESOPHAGOGASTRODUODENOSCOPY (EGD) WITH PROPOFOL  N/A 03/16/2021   Procedure: ESOPHAGOGASTRODUODENOSCOPY (EGD) WITH PROPOFOL ;  Surgeon: Janalyn Keene NOVAK, MD;  Location: [facility] ENDOSCOPY;  Service: Endoscopy;  Laterality: N/A;   REDUCTION MAMMAPLASTY Bilateral 2002   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   TOTAL ABDOMINAL HYSTERECTOMY  2003   Right Oophrectomy    No Known Allergies   B/L toes 01/07/2024  Objective: Physical Exam General: The patient is alert and oriented x3 in no acute distress.  Dermatology: Hyperkeratotic dystrophic discolored nails noted specifically to the bilateral great toes.  Vascular: Palpable pedal pulses bilaterally. No edema or erythema noted. Capillary refill within normal limits.  Neurological: Grossly intact via light touch  Musculoskeletal Exam: No pedal deformity noted  Assessment: #1 Onychomycosis of toenails bilateral;  improved  Plan of Care:  -Patient evaluated - Continue oral Lamisil  until completed.  She states that for period of time she did not take the oral Lamisil  -Continue Penlac  8% topical solution apply daily - Return to clinic PRN   Thresa EMERSON Sar, DPM [facility]  Dr. Thresa EMERSON Sar, DPM    [address] [place], [place] [address]                Office [phone]  Fax [phone]

## 2024-07-13 ENCOUNTER — Encounter: Payer: Self-pay | Admitting: Internal Medicine

## 2024-07-13 NOTE — Assessment & Plan Note (Signed)
 Continue crestor . Low cholesterol diet and exercise. Follow lipid panel.  Lab Results  Component Value Date   CHOL 163 07/07/2024   HDL 86.30 07/07/2024   LDLCALC 65 07/07/2024   TRIG 57.0 07/07/2024   CHOLHDL 2 07/07/2024

## 2024-07-13 NOTE — Assessment & Plan Note (Signed)
 Acid reflux controlled. Continue nexium .

## 2024-07-13 NOTE — Assessment & Plan Note (Signed)
 During recent hospitalization was found to be in aflutter.  Aflutter resolved spontaneously.  CT - positive for LLL subsegmental pulmonary embolus. She was discharged with heart monitor.   Denies any increased heart rate or palpitations currently.  Saw cardiology 06/05/23. Echo showed normal LVEF, normal diastolic function, normal EVSF, trivial valve disease, positive saline study after valsalva suggestive of patent foramen ovale. Paroxysmal SVT noted on recent heart monitor.  Recommended continuing toprol .  Planning f/u with cardiology.  Patent foramen ovale - Dr Jerelene Monday reviewed - felt like small PFO - incidental finding.  No further w/up warranted.  Did not feel aspirin warranted after eliquis . Off eliquis .  Doing well. Appears to be in SR today.

## 2024-07-13 NOTE — Assessment & Plan Note (Signed)
 Has been seeing Dr Tamea - continue tezspire . Has noticed some increased sob the last few weeks. Continue breo scheduled. Use air supra - scheduled during the day. No increased acid reflux. Does report some runny nose and increased phlegm. Steroid nasal spray and saline nasal spray as directed. Has rx for prednisone . Low threshold to start. Try above measures and if persistent - start prednisone . F/u with Dr Tamea.

## 2024-07-13 NOTE — Assessment & Plan Note (Signed)
 Saw cardiology 05/2023 - on metoprolol. Stable.

## 2024-07-13 NOTE — Assessment & Plan Note (Signed)
 Check vitamin D level with next labs.  ?

## 2024-07-13 NOTE — Assessment & Plan Note (Signed)
 Continue crestor

## 2024-07-13 NOTE — Assessment & Plan Note (Signed)
 Continue metoprolol  and diovan /hctz.  Follow pressures.  Follow metabolic panel.  No changes in medication today.

## 2024-07-13 NOTE — Assessment & Plan Note (Signed)
 Followed by neurology for MS.  Appears to be stable on ocrevus .  . Had f/u with Dr Jasmine 07/11/23 - f/u MS. MRI - stable. No new lesions. Continue ocrelizumab  infusion. Currently stable. Keep f/u appt with neurology.

## 2024-07-24 ENCOUNTER — Encounter: Payer: Self-pay | Admitting: Pulmonary Disease

## 2024-07-24 ENCOUNTER — Ambulatory Visit: Admitting: Pulmonary Disease

## 2024-07-24 VITALS — BP 136/92 | HR 58 | Temp 97.6°F | Ht 67.0 in | Wt 208.6 lb

## 2024-07-24 DIAGNOSIS — D721 Eosinophilia, unspecified: Secondary | ICD-10-CM

## 2024-07-24 DIAGNOSIS — J4551 Severe persistent asthma with (acute) exacerbation: Secondary | ICD-10-CM | POA: Diagnosis not present

## 2024-07-24 DIAGNOSIS — J849 Interstitial pulmonary disease, unspecified: Secondary | ICD-10-CM

## 2024-07-24 DIAGNOSIS — J8283 Eosinophilic asthma: Secondary | ICD-10-CM

## 2024-07-24 DIAGNOSIS — J45991 Cough variant asthma: Secondary | ICD-10-CM

## 2024-07-24 LAB — NITRIC OXIDE: Nitric Oxide: 99

## 2024-07-24 NOTE — Progress Notes (Signed)
 Subjective:    Patient ID: Jackie White, female    DOB: 10-Mar-1956, 68 y.o.   MRN: [id number]  Patient Care Team: Glendia Shad, MD as PCP - General (Internal Medicine) Tamea Dedra CROME, MD as Consulting Physician (Pulmonary Disease)  Chief Complaint  Patient presents with   Asthma    Cough, shortness of breath and wheezing.     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  I last saw the patient on 12 June 2024. She was started on Tezspire  in June 2024.  She had right total knee replacement on 02 Apr 2023 and subsequently was admitted briefly to [facility] on 17 May due to DVT/PE post op.  No other admissions since then.  She has completed Eliquis  course.   HPI Discussed the use of AI scribe software for clinical note transcription with the patient, who gave verbal consent to proceed.  History of Present Illness   Jackie White is a 68 year old female with severe persistent asthma who presents for follow-up.  Her neurologist, Dr. Jasmine is concerned of possible hypersensitivity pneumonitis related to Ocrevus  use.  Note that I have been following this patient since February 2020 and on multiple occasions have mentioned the fact that Ocrevus  likely adds to her respiratory symptoms (first discussed with the patient in July 2020).  She does have severe persistent asthma as well.  She experiences persistent asthma symptoms, including significant phlegm production and occasional yellow sputum, particularly in the morning after using Breo. Breo and [employer] are helpful in alleviating her symptoms. She has not used prednisone  in the last three months but keeps it available.  To evaluate the potential concern for hypersensitivity pneumonitis related to Ocrevus , I have recommended to the patient to proceed  with high-resolution chest CT. Previous CT scans and chest x-rays were clear, but she has not undergone a high-resolution CT scan before.   Chest CT performed 18 January 2021 showed clear lungs however the patient did have potential esophageal dysmotility suggested by air-fluid level in the esophagus, at that time no other explanation was noted for chronic cough, subsequently the patient was diagnosed with asthma.    Review of Systems A 10 point review of systems was performed and it is as noted above otherwise negative.   Patient Active Problem List   Diagnosis Date Noted   COVID-19 virus infection 03/09/2024   URI with cough and congestion 01/20/2024   Thumb pain, left 10/25/2023   Thickened nails 07/25/2023   Wrist pain 07/25/2023   PSVT (paroxysmal supraventricular tachycardia) (HCC) 06/07/2023   Patent foramen ovale 06/07/2023   History of right knee surgery 04/15/2023   Pulmonary embolus (HCC) 04/15/2023   Atrial flutter (HCC) 04/15/2023   Pre-op evaluation 03/18/2023   Nasal congestion 10/23/2022   Severe persistent asthma 09/28/2022   Gas bloat syndrome 07/23/2022   History of COVID-19 03/09/2022   Eosinophilic asthma 11/17/2021   Eosinophilia 10/10/2021   History of colon polyps 03/10/2021   History of iron deficiency 03/10/2021   PUD (peptic ulcer disease) 03/10/2021   Aortic atherosclerosis (HCC) 01/24/2021   Skin lesion 09/19/2020   Cough variant asthma 11/19/2019   Abnormal EKG 02/21/2019   Family history of coronary artery disease 02/21/2019   Cough 01/05/2019   Shortness of breath 01/05/2019   Wheezing 07/21/2018   Impingement syndrome of left shoulder  region 05/01/2017   Osteoarthritis of knee 05/01/2017   Trochanteric bursitis of right hip 05/01/2017   Left shoulder pain 04/24/2017   Right knee pain 04/24/2017   Hemorrhoids 08/02/2016   Low back pain 05/17/2016   Right hip pain 05/17/2016   Chest tightness 03/01/2016   Rash 01/31/2015   Health care  maintenance 01/31/2015   Left knee pain 10/12/2013   Pain in joint involving lower leg 10/12/2013   GERD (gastroesophageal reflux disease) 12/29/2012   Abnormal Pap smear of cervix 12/29/2012   Essential hypertension 12/25/2012   Anemia 12/25/2012   Degeneration of lumbar or lumbosacral intervertebral disc 12/25/2012   Hypercholesterolemia 12/25/2012   Vitamin D  deficiency 12/25/2012   Multiple sclerosis (HCC) 12/25/2012    Social History   Tobacco Use   Smoking status: Never   Smokeless tobacco: Never  Substance Use Topics   Alcohol use: Yes    Alcohol/week: 0.0 standard drinks of alcohol    No Known Allergies  Current Meds  Medication Sig   AIRSUPRA  90-80 MCG/ACT AERO INHALE 2 PUFFS INTO THE LUNGS EVERY 4 HOURS AS NEEDED FOR SHORTNESS OF BREATH OR WHEEZING   BREO ELLIPTA  200-25 MCG/ACT AEPB INHALE 1 PUFF EVERY DAY   cetirizine (ZYRTEC) 10 MG tablet Take by mouth.   ciclopirox  (PENLAC ) 8 % solution Apply topically at bedtime. Apply over nail and surrounding skin. Apply daily over previous coat. After seven (7) days, may remove with alcohol and continue cycle.   esomeprazole  (NEXIUM ) 40 MG capsule TAKE 1 CAPSULE(40 MG) BY MOUTH DAILY   metoprolol  succinate (TOPROL -XL) 25 MG 24 hr tablet Take 1 tablet (25 mg total) by mouth daily.   Ocrelizumab  (OCREVUS  IV) Inject into the vein.   rosuvastatin  (CRESTOR ) 5 MG tablet Take 1 tablet (5 mg total) by mouth every Monday, Wednesday, and Friday.   terbinafine  (LAMISIL ) 250 MG tablet Take 1 tablet (250 mg total) by mouth daily.   TEZSPIRE  210 MG/1. SOAJ INJECT 1 PEN UNDER THE SKIN EVERY 28 DAYS   valsartan -hydrochlorothiazide  (DIOVAN -HCT) 320-25 MG tablet Take 1 tablet by mouth daily.    Immunization History  Administered Date(s) Administered   INFLUENZA, HIGH DOSE SEASONAL PF 09/08/2019, 08/30/2023   Influenza Split 09/26/2012, 09/24/2013   Influenza,inj,Quad PF,6+ Mos 09/14/2020, 11/10/2021   Influenza-Unspecified 08/27/2014,  09/21/2015, 09/20/2016, 09/10/2018, 09/20/2022   MMR 06/08/2004, 07/15/2004   PFIZER(Purple Top)SARS-COV-2 Vaccination 02/10/2020, 03/02/2020, 11/16/2020, 03/18/2021   Pfizer Covid-19 Vaccine Bivalent Booster 43yrs & up 08/25/2021   Pfizer(Comirnaty)Fall Seasonal Vaccine 12 years and older 08/30/2023   Zoster Recombinant(Shingrix) 09/11/2019, 11/12/2019        Objective:     BP (!) 136/92   Pulse (!) 58   Temp 97.6 F (36.4 C) (Temporal)   Ht 5' 7 (1.702 m)   Wt 208 lb 9.6 oz (94.6 kg)   LMP 12/27/2000   SpO2 98%   BMI 32.67 kg/m   SpO2: 98 %  GENERAL: Overweight woman, no acute distress.  Fully ambulatory.  No conversational dyspnea.  No respiratory distress. HEAD: Normocephalic, atraumatic.   EYES: Pupils equal, round, reactive to light.  No scleral icterus. MOUTH: Dentition intact, oral mucosa moist.  No thrush. NECK: Supple. No thyromegaly. Trachea midline. No JVD.  No adenopathy. PULMONARY: Good air entry bilaterally.  Rare end expiratory wheeze.   CARDIOVASCULAR: S1 and S2. Regular rate and rhythm.  No rubs, murmurs or gallops heard. ABDOMEN: Benign. MUSCULOSKELETAL: No joint deformity, no clubbing, no edema. NEUROLOGIC: No focal deficit, no gait disturbance, speech  is fluent. SKIN: Intact,warm,dry.  No rashes. PSYCH: Mood and behavior normal.   Lab Results  Component Value Date   NITRICOXIDE 99 07/24/2024  *Elevated nitric oxide  consistent with type II inflammation present *Trend: 181>>133>>117>>154 >> 97>>99 ppb   Assessment & Plan:     ICD-10-CM   1. Severe persistent asthma with acute exacerbation  J45.51 Nitric oxide     2. Cough variant asthma  J45.991 Nitric oxide     3. Eosinophilia, unspecified type  D72.10 CT Chest High Resolution    4. ILD (interstitial lung disease) (HCC) - suspected  J84.9 CT Chest High Resolution      Orders Placed This Encounter  Procedures   CT Chest High Resolution    Standing Status:   Future    Expiration Date:    07/24/2025    Preferred imaging location?:   [facility]   Nitric oxide     Discussion:    Severe persistent asthma with acute exacerbation and cough variant features Severe persistent asthma with acute exacerbation, characterized by increased airway inflammation and cough variant features. Current inflammation levels are in the nineties, similar to previous levels. She experiences phlegm production, particularly yellow sputum in the morning after using Breo. Air Supra is reported to help. Wheezing noted on examination. No recent use of prednisone , last taken three months ago. - Continue Breo and Air Supra as prescribed. - Start prednisone  due to current exacerbation and poor air quality (patient has on hand).  Possible hypersensitivity pneumonitis due to Ocrevus  Possible hypersensitivity pneumonitis secondary to Ocrevus  use. Previous CT scan was clear, but high resolution CT is planned to further evaluate. Discussion of alternative treatment options for underlying condition, including potential switch to Mavenclad, which also depletes B and T cells. Consideration of changing asthma management if Ocrevus  is discontinued due to drug interactions. - Order high resolution chest CT to evaluate for hypersensitivity pneumonitis. - Consider alternative treatment options such as Mavenclad if hypersensitivity pneumonitis is confirmed. - Evaluate potential changes in asthma management if Ocrevus  is discontinued.     Advised if symptoms do not improve or worsen, to please contact office for sooner follow up or seek emergency care.    I spent 40 minutes of dedicated to the care of this patient on the date of this encounter to include pre-visit review of records, face-to-face time with the patient discussing conditions above, post visit ordering of testing, clinical documentation with the electronic health record, making appropriate referrals as documented, and communicating necessary findings to members  of the patients care team.     C. Leita Sanders, MD Advanced Bronchoscopy PCCM [facility]    *This note was generated using voice recognition software/Dragon and/or AI transcription program.  Despite best efforts to proofread, errors can occur which can change the meaning. Any transcriptional errors that result from this process are unintentional and may not be fully corrected at the time of dictation.

## 2024-07-24 NOTE — Patient Instructions (Signed)
 VISIT SUMMARY:  Jackie White, a 68 year old female with severe persistent asthma, came in for a follow-up visit. She was referred for evaluation of possible hypersensitivity pneumonitis related to her Ocrevus  medication. She experiences persistent asthma symptoms, including significant phlegm production and occasional yellow sputum, particularly in the morning after using Breo. Her current medications, Breo and [employer], help alleviate her symptoms. There is a concern for hypersensitivity pneumonitis potentially related to Ocrevus , as suggested by Dr. Jasmine, who noted elevated IgG levels. Previous CT scans and chest x-rays were clear, but she has not undergone a high-resolution CT scan before.  YOUR PLAN:  -SEVERE PERSISTENT ASTHMA WITH ACUTE EXACERBATION AND COUGH VARIANT FEATURES: Severe persistent asthma is a long-term condition where your airways are always inflamed and can become even more swollen during an exacerbation, making it hard to breathe. You are experiencing increased phlegm production and occasional yellow sputum, particularly in the morning after using Breo. Continue using Breo and Air Supra as prescribed. Due to the current exacerbation and poor air quality, you will start taking prednisone .  -POSSIBLE HYPERSENSITIVITY PNEUMONITIS DUE TO OCREVUS : Hypersensitivity pneumonitis is an inflammation of the lungs caused by an allergic reaction to inhaled substances, which may be related to your Ocrevus  medication. We will order a high-resolution chest CT to evaluate this further. If hypersensitivity pneumonitis is confirmed, we may consider switching your treatment to Mavenclad, which also targets B and T cells. We will also evaluate potential changes in your asthma management if Ocrevus  is discontinued.  INSTRUCTIONS:  Please schedule a high-resolution chest CT scan as soon as possible. Continue taking Breo and [employer] as prescribed, and start the prednisone  as directed. Follow up with  us  after the CT scan to discuss the results and any necessary changes to your treatment plan.

## 2024-08-02 ENCOUNTER — Encounter: Payer: Self-pay | Admitting: Pulmonary Disease

## 2024-08-08 ENCOUNTER — Ambulatory Visit
Admission: RE | Admit: 2024-08-08 | Discharge: 2024-08-08 | Disposition: A | Discharge status: Home / Self Care | Source: Ambulatory Visit | Attending: Pulmonary Disease | Admitting: Pulmonary Disease

## 2024-08-08 DIAGNOSIS — D721 Eosinophilia, unspecified: Secondary | ICD-10-CM | POA: Insufficient documentation

## 2024-08-08 DIAGNOSIS — I700 Atherosclerosis of aorta: Secondary | ICD-10-CM | POA: Diagnosis not present

## 2024-08-08 DIAGNOSIS — J849 Interstitial pulmonary disease, unspecified: Secondary | ICD-10-CM | POA: Insufficient documentation

## 2024-08-08 DIAGNOSIS — R918 Other nonspecific abnormal finding of lung field: Secondary | ICD-10-CM | POA: Diagnosis not present

## 2024-08-14 ENCOUNTER — Ambulatory Visit: Payer: Self-pay | Admitting: Pulmonary Disease

## 2024-08-14 NOTE — Progress Notes (Unsigned)
 Cardiology Office Note  Date:  08/15/2024   ID:  Jackie White, Jackie White 07-16-1956, MRN [id number]  PCP:  Jackie Shad, MD   Chief Complaint  Patient presents with   12 month follow up     Patient c/o shortness of breath with over exertion and heart rate is running lower than usual with the rate between 55 to 58 bpm.     HPI:  Jackie White is a 68 year-old woman with past medical history of Jackie, stable on infusions every 6 months,  On ocrelizumab  Anemia Chronic cough, better on singulair  GERD CT coronary calcium  scoring of 0, no other abnormalities on CT scan Acute DVT,  provoked PE 03/2023 seen at [facility] Who presents for follow-up of her SOB (seen on 12/31/2018), leg swelling  Last seen by myself in clinic 10/24 Reports her Jackie has been relatively well-controlled States that she is doing a trial off her Jackie medication given some underlying lung disorder, inflammation/infection  Good summer, was able to travel [place] 10/24 Did river cruise Went to [place] on a cruise  Some rare episodes of tachycardia, rate 104, woke her up, for 1 hour Watch said afib: 5 Am Telemetry strips reviewed, concerning for atrial fibrillation  Concerned about low heart rate on metoprolol  succinate 25 daily  High-resolution CT scan chest images pulled up and reviewed  with minimal aortic atherosclerosis in the arch, no significant coronary calcification  Currently off Eliquis  following prior treatment for PE May 2024, provoked following knee replacement on right  EKG personally reviewed by myself on todays visit EKG Interpretation Date/Time:  Friday August 15 2024 09:55:15 EDT Ventricular Rate:  56 PR Interval:  178 QRS Duration:  70 QT Interval:  422 QTC Calculation: 407 R Axis:   -12  Text Interpretation: Sinus bradycardia Nonspecific ST abnormality When compared with ECG of 07-Sep-2023 11:22, No significant change was found Confirmed by Jackie White [phone]) on 08/15/2024  10:07:26 AM   Past medical history reviewed Following knee surgery  provoked PE 03/2023 , still on eliquis  5 twice daily CTA chest at [facility]: Positive for left lower lobe subsegmental pulmonary emboli.   CT scan March 2022  Minimal aortic athero on CT, minimal in the arch  Nonsmoker Nondiabetic  EKG personally reviewed by myself on todays visit EKG Interpretation Date/Time:  Friday August 15 2024 09:55:15 EDT Ventricular Rate:  56 PR Interval:  178 QRS Duration:  70 QT Interval:  422 QTC Calculation: 407 R Axis:   -12  Text Interpretation: Sinus bradycardia Nonspecific ST abnormality When compared with ECG of 07-Sep-2023 11:22, No significant change was found Confirmed by Jackie White [phone]) on 08/15/2024 10:07:26 AM    Other past medical history reviewed Last seen in clinic July 2020 She reported periodic leg swelling, was sitting for long periods of time at work, also on her feet all day at times, case manager Jackie White  Echocardiogram June 2020 with normal ejection fraction Grade 2 diastolic dysfunction, mildly elevated right heart pressures, mildly dilated left atrium  Infusion  Q6 months for Jackie Disease has been relatively well controlled past 2 years  Family hx Aunt has amyloid CAD  PMH:   has a past medical history of Abnormal Pap smear, Cervical disc disease, COVID-19, GERD (gastroesophageal reflux disease), History of iron deficiency, Hypertension, Multiple sclerosis, Personal history of colonic polyps, Reactive airway disease, and Vitamin D  deficiency.  PSH:    Past Surgical History:  Procedure Laterality Date   BREAST REDUCTION SURGERY  COLONOSCOPY WITH PROPOFOL  N/A 03/16/2021   Procedure: COLONOSCOPY WITH PROPOFOL ;  Surgeon: Jackie Keene NOVAK, MD;  Location: [facility] ENDOSCOPY;  Service: Endoscopy;  Laterality: N/A;   ESOPHAGOGASTRODUODENOSCOPY (EGD) WITH PROPOFOL  N/A 03/16/2021   Procedure: ESOPHAGOGASTRODUODENOSCOPY (EGD) WITH PROPOFOL ;  Surgeon:  Jackie Keene NOVAK, MD;  Location: [facility] ENDOSCOPY;  Service: Endoscopy;  Laterality: N/A;   REDUCTION MAMMAPLASTY Bilateral 2002   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   TOTAL ABDOMINAL HYSTERECTOMY  2003   Right Oophrectomy    Current Outpatient Medications  Medication Sig Dispense Refill   AIRSUPRA  90-80 MCG/ACT AERO INHALE 2 PUFFS INTO THE LUNGS EVERY 4 HOURS AS NEEDED FOR SHORTNESS OF BREATH OR WHEEZING 10.7 g 6   BREO ELLIPTA  200-25 MCG/ACT AEPB INHALE 1 PUFF EVERY DAY 180 each 3   cetirizine (ZYRTEC) 10 MG tablet Take by mouth.     ciclopirox  (PENLAC ) 8 % solution Apply topically at bedtime. Apply over nail and surrounding skin. Apply daily over previous coat. After seven (7) days, may remove with alcohol and continue cycle. 6.6 mL 1   esomeprazole  (NEXIUM ) 40 MG capsule TAKE 1 CAPSULE(40 MG) BY MOUTH DAILY 90 capsule 3   metoprolol  succinate (TOPROL -XL) 25 MG 24 hr tablet Take 1 tablet (25 mg total) by mouth daily. 90 tablet 3   Ocrelizumab  (OCREVUS  IV) Inject into the vein.     rosuvastatin  (CRESTOR ) 5 MG tablet Take 1 tablet (5 mg total) by mouth every Monday, Wednesday, and Friday. 39 tablet 3   terbinafine  (LAMISIL ) 250 MG tablet Take 1 tablet (250 mg total) by mouth daily. 90 tablet 0   TEZSPIRE  210 MG/1. SOAJ INJECT 1 PEN UNDER THE SKIN EVERY 28 DAYS 1.91 mL 5   valsartan -hydrochlorothiazide  (DIOVAN -HCT) 320-25 MG tablet Take 1 tablet by mouth daily. 90 tablet 3   No current facility-administered medications for this visit.    Allergies:   Patient has no known allergies.   Social History:  The patient  reports that she has never smoked. She has never used smokeless tobacco. She reports current alcohol use. She reports that she does not use drugs.   Family History:   family history includes Heart disease in her maternal grandmother; Hypertension in her mother; Stroke in her mother.    Review of Systems: Review of Systems  Constitutional: Negative.   HENT: Negative.     Respiratory: Negative.    Cardiovascular: Negative.   Gastrointestinal: Negative.   Musculoskeletal: Negative.   Neurological: Negative.   Psychiatric/Behavioral: Negative.    All other systems reviewed and are negative.   PHYSICAL EXAM: VS:  BP 110/70 (BP Location: Left Arm, Patient Position: Sitting, Cuff Size: Normal)   Pulse (!) 56   Ht 5' 7 (1.702 m)   Wt 207 lb (93.9 kg)   LMP 12/27/2000   SpO2 96%   BMI 32.42 kg/m  , BMI Body mass index is 32.42 kg/m. Constitutional:  oriented to person, place, and time. No distress.  HENT:  Head: Grossly normal Eyes:  no discharge. No scleral icterus.  Neck: No JVD, no carotid bruits  Cardiovascular: Regular rate and rhythm, no murmurs appreciated Pulmonary/Chest: Clear to auscultation bilaterally, no wheezes or rails Abdominal: Soft.  no distension.  no tenderness.  Musculoskeletal: Normal range of motion Neurological:  normal muscle tone. Coordination normal. No atrophy Skin: Skin warm and dry Psychiatric: normal affect, pleasant  Recent Labs: 03/03/2024: Hemoglobin 13.2; Platelets 249.0; TSH 1.13 07/07/2024: ALT 11; BUN 10; Creatinine, Ser 0.78; Potassium 4.4; Sodium 143  Lipid Panel Lab Results  Component Value Date   CHOL 163 07/07/2024   HDL 86.30 07/07/2024   LDLCALC 65 07/07/2024   TRIG 57.0 07/07/2024    Wt Readings from Last 3 Encounters:  08/15/24 207 lb (93.9 kg)  07/24/24 208 lb 9.6 oz (94.6 kg)  07/11/24 208 lb (94.3 kg)     ASSESSMENT AND PLAN:  Problem List Items Addressed This Visit       Cardiology Problems   Essential hypertension   Relevant Orders   EKG 12-Lead (Completed)   Hypercholesterolemia   Aortic atherosclerosis   Relevant Orders   EKG 12-Lead (Completed)   Atrial flutter (HCC)   Relevant Orders   EKG 12-Lead (Completed)   PSVT (paroxysmal supraventricular tachycardia) - Primary   Relevant Orders   EKG 12-Lead (Completed)   Pulmonary embolus (HCC)     Other   Shortness of  breath   Paroxysmal atrial fibrillation Has watched tracking her rhythm, has rhythm strips available Paroxysmal tachycardic episode September 13 at 5 AM, EKG reviewed from her phone looking concerning for atrial fibrillation, no further episodes - Will hold off on starting Eliquis  - Recommend she monitor watch rhythms a weekly basis and communicate with our office for additional episodes of atrial fibrillation - For frequent episodes of atrial fibrillation, Zio monitor could be ordered, antiarrhythmic medication such as flecainide could be started with metoprolol  succinate  Shortness of breath Working with pulmonary, underlying inflammation/infectious process  Essential hypertension Blood pressure is well controlled on today's visit. No changes made to the medications.  Jackie Reports she is doing a trial hold of Jackie medication in the setting of lung issues  DVT/PE Following knee replacement at [facility] on the right May 2024 Off Eliquis   Aortic atherosclerosis Minimal athero, in the arch, images pulled up and reviewed On crestor  M/W/F Reasonable cholesterol numbers  Hyperlipidemia Continue reduced dose Crestor    Orders Placed This Encounter  Procedures   EKG 12-Lead      Signed, Velinda Lunger, M.D., Ph.D. 08/15/2024  [facility] [place], [place] [phone]

## 2024-08-15 ENCOUNTER — Encounter: Payer: Self-pay | Admitting: Cardiovascular Disease

## 2024-08-15 ENCOUNTER — Ambulatory Visit: Attending: Cardiovascular Disease | Admitting: Cardiovascular Disease

## 2024-08-15 VITALS — BP 110/70 | HR 56 | Ht 67.0 in | Wt 207.0 lb

## 2024-08-15 DIAGNOSIS — I2699 Other pulmonary embolism without acute cor pulmonale: Secondary | ICD-10-CM

## 2024-08-15 DIAGNOSIS — I10 Essential (primary) hypertension: Secondary | ICD-10-CM

## 2024-08-15 DIAGNOSIS — I480 Paroxysmal atrial fibrillation: Secondary | ICD-10-CM

## 2024-08-15 DIAGNOSIS — I471 Supraventricular tachycardia, unspecified: Secondary | ICD-10-CM

## 2024-08-15 DIAGNOSIS — E7800 Pure hypercholesterolemia, unspecified: Secondary | ICD-10-CM | POA: Diagnosis not present

## 2024-08-15 DIAGNOSIS — R0602 Shortness of breath: Secondary | ICD-10-CM | POA: Diagnosis not present

## 2024-08-15 DIAGNOSIS — I4892 Unspecified atrial flutter: Secondary | ICD-10-CM | POA: Diagnosis not present

## 2024-08-15 DIAGNOSIS — I700 Atherosclerosis of aorta: Secondary | ICD-10-CM

## 2024-08-15 NOTE — Patient Instructions (Addendum)
 Monitor watch for afib  Medication Instructions:  No changes  If you need a refill on your cardiac medications before your next appointment, please call your pharmacy.   Lab work: No new labs needed  Testing/Procedures: No new testing needed  Follow-Up: At [facility], you and your health needs are our priority.  As part of our continuing mission to provide you with exceptional heart care, we have created designated Provider Care Teams.  These Care Teams include your primary Cardiologist (physician) and Advanced Practice Providers (APPs -  Physician Assistants and Nurse Practitioners) who all work together to provide you with the care you need, when you need it.  You will need a follow up appointment in 6 months  Providers on your designated Care Team:   Lonni Meager, NP Bernardino Bring, PA-C Cadence [place], [place]  COVID-19 Vaccine Information can be found at: [email or web address] For questions related to vaccine distribution or appointments, please email [email or web address] or call 6280812752.

## 2024-08-16 NOTE — Progress Notes (Signed)
Thank you for seeing her and thank you for the update.   Jackie White

## 2024-09-12 ENCOUNTER — Ambulatory Visit: Admitting: Pulmonary Disease

## 2024-09-12 ENCOUNTER — Encounter: Payer: Self-pay | Admitting: Pulmonary Disease

## 2024-09-12 VITALS — BP 132/80 | HR 65 | Temp 97.7°F | Ht 67.0 in | Wt 210.0 lb

## 2024-09-12 DIAGNOSIS — J4551 Severe persistent asthma with (acute) exacerbation: Secondary | ICD-10-CM

## 2024-09-12 DIAGNOSIS — G35D Multiple sclerosis, unspecified: Secondary | ICD-10-CM | POA: Diagnosis not present

## 2024-09-12 DIAGNOSIS — D721 Eosinophilia, unspecified: Secondary | ICD-10-CM | POA: Diagnosis not present

## 2024-09-12 DIAGNOSIS — Z23 Encounter for immunization: Secondary | ICD-10-CM

## 2024-09-12 LAB — NITRIC OXIDE: Nitric Oxide: 90

## 2024-09-12 MED ORDER — BREZTRI AEROSPHERE 160-9-4.8 MCG/ACT IN AERO: 2.0000 | INHALATION_SPRAY | Freq: Two times a day (BID) | RESPIRATORY_TRACT | Status: DC

## 2024-09-12 NOTE — Patient Instructions (Signed)
 VISIT SUMMARY:  Today, we discussed the management of your severe persistent asthma. We reviewed your current medications and symptoms, and made some adjustments to your treatment plan to help improve your condition.  YOUR PLAN:  -SEVERE PERSISTENT ASTHMA: Severe persistent asthma is a long-term condition where your airways become inflamed and produce a lot of mucus, making it hard to breathe. We will provide you with a sample of Breztri to try instead of Breo. Use albuterol  before using the flutter valve in the morning to help clear mucus. Remember to rinse your mouth after using Breztri. Please report back on how Breztri works for you so we can decide on future prescriptions.  INSTRUCTIONS:  Please schedule a follow-up appointment in three months. Report back on the effectiveness of Breztri to determine future prescription.  You received your flu vaccine today.

## 2024-09-12 NOTE — Progress Notes (Signed)
 Subjective:    Patient ID: Jackie White, female    DOB: 31-Aug-1956, 68 y.o.   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  I last saw the patient on 24 July 2024. She was started on Tezspire  in June 2024.  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  No other admissions since then.  She has completed Eliquis  course.   HPI Discussed the use of AI scribe software for clinical note transcription with the patient, who gave verbal consent to proceed.  History of Present Illness   Jackie White is a 68 year old female with severe persistent asthma who presents for management of her condition.  She is on Ocrevus  every 6 months for MS.  The possibility of hypersensitivity pneumonitis secondary to the Ocrevus  was considered however high-resolution CT chest did not show evidence of this.  Patient does have severe persistent asthma. She is on Tezspire , which she received last week and feels has been beneficial.  She is very compliant with the Tezspire .  Her symptoms are worse in the morning, with mucus accumulation that she clears using a flutter valve and albuterol . She experiences relief after coughing up the mucus, which is described as yellow, and feels better for the rest of the day until symptoms recur at night.  She has previously tried Trelegy but did not tolerate it well. She is currently on Breo but has not tried Breztri, which is a twice-daily  inhaler.     Independently reviewed CT chest high-res on 08 August 2024, there is no evidence of fibrotic interstitial lung disease.  There is mild diffuse bilateral bronchial wall thickening consistent with nonspecific infectious or inflammatory bronchitis and could also be consistent with the patient's asthma.  Patient desires to have flu vaccine today.  Review of Systems A 10 point review of systems was performed and it is as noted above otherwise negative.   Patient Active Problem List   Diagnosis Date Noted   COVID-19 virus infection 03/09/2024   URI with cough and congestion 01/20/2024   Thumb pain, left 10/25/2023   Thickened nails 07/25/2023   Wrist pain 07/25/2023   PSVT (paroxysmal supraventricular tachycardia) 06/07/2023   Patent foramen ovale 06/07/2023   History of right knee surgery 04/15/2023   Pulmonary embolus (HCC) 04/15/2023   Atrial flutter (HCC) 04/15/2023   Pre-op evaluation 03/18/2023   Nasal congestion 10/23/2022   Severe persistent asthma (HCC) 09/28/2022   Gas bloat syndrome 07/23/2022   History of COVID-19 03/09/2022   Eosinophilic asthma 11/17/2021   Eosinophilia 10/10/2021   History of colon polyps 03/10/2021   History of iron deficiency 03/10/2021   PUD (peptic ulcer disease) 03/10/2021   Aortic atherosclerosis 01/24/2021   Skin lesion 09/19/2020   Cough variant asthma 11/19/2019   Abnormal EKG 02/21/2019   Family history of coronary artery disease 02/21/2019   Cough 01/05/2019   Shortness of breath 01/05/2019   Wheezing 07/21/2018   Impingement syndrome of left shoulder region 05/01/2017  Osteoarthritis of knee 05/01/2017   Trochanteric bursitis of right hip 05/01/2017   Left shoulder pain 04/24/2017   Right knee pain 04/24/2017   Hemorrhoids 08/02/2016   Low back pain 05/17/2016   Right hip pain 05/17/2016   Chest tightness 03/01/2016   Rash 01/31/2015   Health care maintenance 01/31/2015   Left knee pain 10/12/2013   Pain in  joint involving lower leg 10/12/2013   GERD (gastroesophageal reflux disease) 12/29/2012   Abnormal Pap smear of cervix 12/29/2012   Essential hypertension 12/25/2012   Anemia 12/25/2012   Degeneration of lumbar or lumbosacral intervertebral disc 12/25/2012   Hypercholesterolemia 12/25/2012   Vitamin D  deficiency 12/25/2012   Multiple sclerosis 12/25/2012    Social History   Tobacco Use   Smoking status: Never   Smokeless tobacco: Never  Substance Use Topics   Alcohol use: Yes    Alcohol/week: 0.0 standard drinks of alcohol    No Known Allergies  Current Meds  Medication Sig   AIRSUPRA  90-80 MCG/ACT AERO INHALE 2 PUFFS INTO THE LUNGS EVERY 4 HOURS AS NEEDED FOR SHORTNESS OF BREATH OR WHEEZING   BREO ELLIPTA  200-25 MCG/ACT AEPB INHALE 1 PUFF EVERY DAY   budesonide-glycopyrrolate-formoterol (BREZTRI AEROSPHERE) 160-9-4.8 MCG/ACT AERO inhaler Inhale 2 puffs into the lungs in the morning and at bedtime.   cetirizine (ZYRTEC) 10 MG tablet Take by mouth.   ciclopirox  (PENLAC ) 8 % solution Apply topically at bedtime. Apply over nail and surrounding skin. Apply daily over previous coat. After seven (7) days, may remove with alcohol and continue cycle.   esomeprazole  (NEXIUM ) 40 MG capsule TAKE 1 CAPSULE(40 MG) BY MOUTH DAILY   metoprolol  succinate (TOPROL -XL) 25 MG 24 hr tablet Take 1 tablet (25 mg total) by mouth daily.   Ocrelizumab  (OCREVUS  IV) Inject into the vein.   rosuvastatin  (CRESTOR ) 5 MG tablet Take 1 tablet (5 mg total) by mouth every Monday, Wednesday, and Friday.   terbinafine  (LAMISIL ) 250 MG tablet Take 1 tablet (250 mg total) by mouth daily.   TEZSPIRE  210 MG/1. SOAJ INJECT 1 PEN UNDER THE SKIN EVERY 28 DAYS   valsartan -hydrochlorothiazide  (DIOVAN -HCT) 320-25 MG tablet Take 1 tablet by mouth daily.    Immunization History  Administered Date(s) Administered   INFLUENZA, HIGH DOSE SEASONAL PF 09/08/2019, 08/30/2023, 09/12/2024   Influenza Split 09/26/2012,  09/24/2013   Influenza,inj,Quad PF,6+ Mos 09/14/2020, 11/10/2021   Influenza-Unspecified 08/27/2014, 09/21/2015, 09/20/2016, 09/10/2018, 09/20/2022   MMR 06/08/2004, 07/15/2004   PFIZER(Purple Top)SARS-COV-2 Vaccination 02/10/2020, 03/02/2020, 11/16/2020, 03/18/2021   Pfizer Covid-19 Vaccine Bivalent Booster 24yrs & up 08/25/2021   Pfizer(Comirnaty)Fall Seasonal Vaccine 12 years and older 08/30/2023   Zoster Recombinant(Shingrix) 09/11/2019, 11/12/2019        Objective:     BP 132/80   Pulse 65   Temp 97.7 F (36.5 C) (Temporal)   Ht 5' 7 (1.702 m)   Wt 210 lb (95.3 kg)   LMP 12/27/2000   SpO2 97%   BMI 32.89 kg/m   SpO2: 97 %  GENERAL: Overweight woman, no acute distress.  Fully ambulatory.  No conversational dyspnea.  No respiratory distress. HEAD: Normocephalic, atraumatic.   EYES: Pupils equal, round, reactive to light.  No scleral icterus. MOUTH: Dentition intact, oral mucosa moist.  No thrush. NECK: Supple. No thyromegaly. Trachea midline. No JVD.  No adenopathy. PULMONARY: Good air entry bilaterally.  No wheezes noted.   CARDIOVASCULAR: S1 and S2. Regular rate and rhythm.  No rubs, murmurs or gallops heard. ABDOMEN: Benign. MUSCULOSKELETAL: No joint deformity,  no clubbing, no edema. NEUROLOGIC: No focal deficit, no gait disturbance, speech is fluent. SKIN: Intact,warm,dry.  No rashes. PSYCH: Mood and behavior normal.  Lab Results  Component Value Date   NITRICOXIDE 90 09/12/2024  *Elevated nitric oxide  consistent with type II inflammation present *Trend: 181>>133>>117>>154 >> 97>>99 >>90 ppb       Assessment & Plan:     ICD-10-CM   1. Severe persistent asthma with acute exacerbation (HCC)  J45.51 Nitric oxide     2. Eosinophilia, unspecified type  D72.10     3. Multiple sclerosis  G35.D     4. Immunization due  Z23 Flu vaccine HIGH DOSE PF(Fluzone Trivalent)      Orders Placed This Encounter  Procedures   Flu vaccine HIGH DOSE PF(Fluzone  Trivalent)   Nitric oxide     Meds ordered this encounter  Medications   budesonide-glycopyrrolate-formoterol (BREZTRI AEROSPHERE) 160-9-4.8 MCG/ACT AERO inhaler    Sig: Inhale 2 puffs into the lungs in the morning and at bedtime.    Lot Number?:   [id number]    Expiration Date?:   10/20/2026    Manufacturer?:   [employer] [[id number]]    NDC:   9689-5383-71 [[id number]]    Quantity:   2   Discussion:    Severe persistent asthma Severe persistent asthma with significant mucus production, particularly in the morning due to overnight accumulation. Current treatment includes Breo, with intolerance to Trelegy. She is using Tezspire  and a flutter valve for mucus clearance. - Provide sample of Breztri for trial use, instruct to hold off on Breo during this trial. - Instruct to use albuterol  before using the flutter valve in the morning to aid mucus clearance. - Advise to rinse mouth after using Breztri. - Schedule follow-up appointment in three months. - Instruct to report back on the effectiveness of Breztri to determine future prescription.     Currently no evidence of hypersensitivity pneumonitis due to Ocrevus  however, Ocrevus  can cause issues with airway inflammation and chronic cough which has been noted to the patient to be a challenge previously.  Continue management as above for now.   Advised if symptoms do not improve or worsen, to please contact office for sooner follow up or seek emergency care.    I spent 32 minutes of dedicated to the care of this patient on the date of this encounter to include pre-visit review of records, face-to-face time with the patient discussing conditions above, post visit ordering of testing, clinical documentation with the electronic health record, making appropriate referrals as documented, and communicating necessary findings to members of the patients care team.     C. Leita Sanders, MD Advanced Bronchoscopy PCCM [facility]    *This  note was generated using voice recognition software/Dragon and/or AI transcription program.  Despite best efforts to proofread, errors can occur which can change the meaning. Any transcriptional errors that result from this process are unintentional and may not be fully corrected at the time of dictation.

## 2024-09-15 ENCOUNTER — Encounter: Payer: Self-pay | Admitting: Pulmonary Disease

## 2024-09-16 ENCOUNTER — Encounter: Payer: Self-pay | Admitting: Pulmonary Disease

## 2024-09-17 NOTE — Telephone Encounter (Signed)
 On the patient's AVS under care team it does have Dr. Jasmine (Psychiatry). But in her chart it show him as a Insurance Account Manager. This is not something  you can change, correct?

## 2024-09-17 NOTE — Telephone Encounter (Signed)
 I will see if I can correct it.  Those are usually put in there before my note populates may have been entered in error by someone building her care team list.

## 2024-09-17 NOTE — Telephone Encounter (Signed)
 For some reason, Epic populates it as psychiatry.  I was able to double check and cross-reference with the address.  I think I have been able to correct the issue.

## 2024-09-30 DIAGNOSIS — M62838 Other muscle spasm: Secondary | ICD-10-CM | POA: Diagnosis not present

## 2024-09-30 DIAGNOSIS — G35D Multiple sclerosis, unspecified: Secondary | ICD-10-CM | POA: Diagnosis not present

## 2024-11-03 ENCOUNTER — Other Ambulatory Visit: Payer: Self-pay | Admitting: Physician Assistant

## 2024-11-03 DIAGNOSIS — G35D Multiple sclerosis, unspecified: Secondary | ICD-10-CM

## 2024-11-07 ENCOUNTER — Other Ambulatory Visit

## 2024-11-07 DIAGNOSIS — I10 Essential (primary) hypertension: Secondary | ICD-10-CM

## 2024-11-07 DIAGNOSIS — Z1159 Encounter for screening for other viral diseases: Secondary | ICD-10-CM

## 2024-11-07 DIAGNOSIS — E7800 Pure hypercholesterolemia, unspecified: Secondary | ICD-10-CM

## 2024-11-07 DIAGNOSIS — E559 Vitamin D deficiency, unspecified: Secondary | ICD-10-CM | POA: Diagnosis not present

## 2024-11-07 LAB — BASIC METABOLIC PANEL WITH GFR
BUN: 11 mg/dL (ref 6–23)
CO2: 33 meq/L — ABNORMAL HIGH (ref 19–32)
Calcium: 9.7 mg/dL (ref 8.4–10.5)
Chloride: 102 meq/L (ref 96–112)
Creatinine, Ser: 0.79 mg/dL (ref 0.40–1.20)
GFR: 77.9 mL/min
Glucose, Bld: 88 mg/dL (ref 70–99)
Potassium: 4.4 meq/L (ref 3.5–5.1)
Sodium: 143 meq/L (ref 135–145)

## 2024-11-07 LAB — CBC WITH DIFFERENTIAL/PLATELET
Basophils Absolute: 0 K/uL (ref 0.0–0.1)
Basophils Relative: 0.2 % (ref 0.0–3.0)
Eosinophils Absolute: 0.3 K/uL (ref 0.0–0.7)
Eosinophils Relative: 4.7 % (ref 0.0–5.0)
HCT: 39.4 % (ref 36.0–46.0)
Hemoglobin: 12.8 g/dL (ref 12.0–15.0)
Lymphocytes Relative: 39.7 % (ref 12.0–46.0)
Lymphs Abs: 2.3 K/uL (ref 0.7–4.0)
MCHC: 32.5 g/dL (ref 30.0–36.0)
MCV: 86.7 fl (ref 78.0–100.0)
Monocytes Absolute: 0.6 K/uL (ref 0.1–1.0)
Monocytes Relative: 11.1 % (ref 3.0–12.0)
Neutro Abs: 2.5 K/uL (ref 1.4–7.7)
Neutrophils Relative %: 44.3 % (ref 43.0–77.0)
Platelets: 275 K/uL (ref 150.0–400.0)
RBC: 4.55 Mil/uL (ref 3.87–5.11)
RDW: 14.9 % (ref 11.5–15.5)
WBC: 5.8 K/uL (ref 4.0–10.5)

## 2024-11-07 LAB — HEPATIC FUNCTION PANEL
ALT: 13 U/L (ref 3–35)
AST: 15 U/L (ref 5–37)
Albumin: 4 g/dL (ref 3.5–5.2)
Alkaline Phosphatase: 72 U/L (ref 39–117)
Bilirubin, Direct: 0.1 mg/dL (ref 0.1–0.3)
Total Bilirubin: 0.6 mg/dL (ref 0.2–1.2)
Total Protein: 6.1 g/dL (ref 6.0–8.3)

## 2024-11-07 LAB — LIPID PANEL
Cholesterol: 151 mg/dL (ref 28–200)
HDL: 88.1 mg/dL
LDL Cholesterol: 54 mg/dL (ref 10–99)
NonHDL: 62.96
Total CHOL/HDL Ratio: 2
Triglycerides: 43 mg/dL (ref 10.0–149.0)
VLDL: 8.6 mg/dL (ref 0.0–40.0)

## 2024-11-07 LAB — VITAMIN D 25 HYDROXY (VIT D DEFICIENCY, FRACTURES): VITD: 18.4 ng/mL — ABNORMAL LOW (ref 30.00–100.00)

## 2024-11-08 LAB — HEPATITIS C ANTIBODY: Hepatitis C Ab: NONREACTIVE

## 2024-11-10 ENCOUNTER — Ambulatory Visit: Payer: Self-pay | Admitting: Internal Medicine

## 2024-11-10 MED ORDER — VITAMIN D (ERGOCALCIFEROL) 1.25 MG (50000 UNIT) PO CAPS: 50000.0000 [IU] | ORAL_CAPSULE | ORAL | 1 refills | Status: AC

## 2024-11-10 NOTE — Telephone Encounter (Signed)
 Rx ok'd for prescription vitamin D  #12 with 1 refill

## 2024-11-11 ENCOUNTER — Ambulatory Visit (INDEPENDENT_AMBULATORY_CARE_PROVIDER_SITE_OTHER): Admitting: Internal Medicine

## 2024-11-11 ENCOUNTER — Encounter: Payer: Self-pay | Admitting: Internal Medicine

## 2024-11-11 VITALS — BP 120/76 | HR 60 | Temp 97.6°F | Ht 67.0 in | Wt 211.4 lb

## 2024-11-11 DIAGNOSIS — G35D Multiple sclerosis, unspecified: Secondary | ICD-10-CM

## 2024-11-11 DIAGNOSIS — J4550 Severe persistent asthma, uncomplicated: Secondary | ICD-10-CM | POA: Diagnosis not present

## 2024-11-11 DIAGNOSIS — I471 Supraventricular tachycardia, unspecified: Secondary | ICD-10-CM | POA: Diagnosis not present

## 2024-11-11 DIAGNOSIS — Z1231 Encounter for screening mammogram for malignant neoplasm of breast: Secondary | ICD-10-CM

## 2024-11-11 DIAGNOSIS — R1030 Lower abdominal pain, unspecified: Secondary | ICD-10-CM | POA: Diagnosis not present

## 2024-11-11 DIAGNOSIS — E7800 Pure hypercholesterolemia, unspecified: Secondary | ICD-10-CM

## 2024-11-11 DIAGNOSIS — E559 Vitamin D deficiency, unspecified: Secondary | ICD-10-CM

## 2024-11-11 DIAGNOSIS — Z0000 Encounter for general adult medical examination without abnormal findings: Secondary | ICD-10-CM

## 2024-11-11 DIAGNOSIS — E2839 Other primary ovarian failure: Secondary | ICD-10-CM

## 2024-11-11 DIAGNOSIS — I4892 Unspecified atrial flutter: Secondary | ICD-10-CM

## 2024-11-11 DIAGNOSIS — K219 Gastro-esophageal reflux disease without esophagitis: Secondary | ICD-10-CM | POA: Diagnosis not present

## 2024-11-11 DIAGNOSIS — I10 Essential (primary) hypertension: Secondary | ICD-10-CM

## 2024-11-11 NOTE — Assessment & Plan Note (Addendum)
 Physical today 11/11/24.  PAP 05/12/20 - negative with negative HPV.  Mammogram 11/12/23 - Briads I. Schedule for f/u mammogram 12/11/24. Colonoscopy 03/16/21 -Hemorrhoids found on perianal exam. Diverticulosis in the sigmoid colon. The examination was otherwise normal.  Schedule for bone density.

## 2024-11-11 NOTE — Progress Notes (Signed)
 "  Subjective:    Patient ID: Jackie White, female    DOB: 09-Aug-1956, 68 y.o.   MRN: [id number]  Patient here for  Chief Complaint  Patient presents with   Medical Management of Chronic Issues   Annual Exam    HPI Here for a physical exam. Saw neurology 09/30/24 - acute visit. Reported spasms in her distal RLE and tingling sensation right arm. Off ocrevus . Recommended updated MRI. Also treaed with medrol  dosepak and flexeril . Had f/u with Dr Tamea 09/12/24 - recommended trial breztri . Also to use albuterol  before using the flutter valve in the morning. Had f/u with Dr Gollan 08/15/24 - monitor heart rhythm. She is noticing some intermittent episodes  of tachycardia - watch said afib. Cardiology had recommended monitoring as outlined. Hold on eliquis . No chest pain. Fullness/ abdominal discomfort - lower abdomen. Bowels moving.    Past Medical History:  Diagnosis Date   Abnormal Pap smear    Asthma    Cervical disc disease    Cervical and lumbar disc disease   COVID-19    02/15/22   GERD (gastroesophageal reflux disease)    History of iron deficiency    Hypertension    Multiple sclerosis    Personal history of colonic polyps    Reactive airway disease    Vitamin D  deficiency    Past Surgical History:  Procedure Laterality Date   BREAST REDUCTION SURGERY     COLONOSCOPY WITH PROPOFOL  N/A 03/16/2021   Procedure: COLONOSCOPY WITH PROPOFOL ;  Surgeon: Janalyn Keene NOVAK, MD;  Location: [facility];  Service: Endoscopy;  Laterality: N/A;   ESOPHAGOGASTRODUODENOSCOPY (EGD) WITH PROPOFOL  N/A 03/16/2021   Procedure: ESOPHAGOGASTRODUODENOSCOPY (EGD) WITH PROPOFOL ;  Surgeon: Janalyn Keene NOVAK, MD;  Location: [facility] ENDOSCOPY;  Service: Endoscopy;  Laterality: N/A;   JOINT REPLACEMENT  04/02/2023   REDUCTION MAMMAPLASTY Bilateral 2002   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   TOTAL ABDOMINAL HYSTERECTOMY  2003   Right Oophrectomy   Family History  Problem Relation Age of  Onset   Stroke Mother    Hypertension Mother    Cancer Mother    Asthma Mother    Heart disease Maternal Grandmother    Breast cancer Neg Hx    Social History   Socioeconomic History   Marital status: Married    Spouse name: Not on file   Number of children: Not on file   Years of education: Not on file   Highest education level: Bachelor's degree (e.g., BA, AB, BS)  Occupational History   Not on file  Tobacco Use   Smoking status: Never   Smokeless tobacco: Never  Vaping Use   Vaping status: Never Used  Substance and Sexual Activity   Alcohol use: Not Currently   Drug use: Never   Sexual activity: Not Currently    Birth control/protection: None  Other Topics Concern   Not on file  Social History Narrative   married   Social Drivers of Health   Tobacco Use: Low Risk (11/17/2024)   Patient History    Smoking Tobacco Use: Never    Smokeless Tobacco Use: Never    Passive Exposure: Not on file  Financial Resource Strain: Low Risk (06/26/2024)   Overall Financial Resource Strain (CARDIA)    Difficulty of Paying Living Expenses: Not hard at all  Food Insecurity: No Food Insecurity (06/26/2024)   Epic    Worried About Radiation Protection Practitioner of Food in the Last Year: Never true    [employer]  in the Last Year: Never true  Transportation Needs: No Transportation Needs (06/26/2024)   Epic    Lack of Transportation (Medical): No    Lack of Transportation (Non-Medical): No  Physical Activity: Insufficiently Active (06/26/2024)   Exercise Vital Sign    Days of Exercise per Week: 2 days    Minutes of Exercise per Session: 20 min  Stress: No Stress Concern Present (06/26/2024)   [employer] of Occupational Health - Occupational Stress Questionnaire    Feeling of Stress: Not at all  Social Connections: Socially Integrated (06/26/2024)   Social Connection and Isolation Panel    Frequency of Communication with Friends and Family: Three times a week    Frequency of Social Gatherings with  Friends and Family: Once a week    Attends Religious Services: 1 to 4 times per year    Active Member of Clubs or Organizations: Yes    Attends Banker Meetings: More than 4 times per year    Marital Status: Married  Depression (PHQ2-9): Low Risk (06/30/2024)   Depression (PHQ2-9)    PHQ-2 Score: 0  Alcohol Screen: Low Risk (06/26/2024)   Alcohol Screen    Last Alcohol Screening Score (AUDIT): 2  Housing: Low Risk (06/26/2024)   Epic    Unable to Pay for Housing in the Last Year: No    Number of Times Moved in the Last Year: 0    Homeless in the Last Year: No  Utilities: Not At Risk (06/30/2024)   Epic    Threatened with loss of utilities: No  Health Literacy: Adequate Health Literacy (06/30/2024)   B1300 Health Literacy    Frequency of need for help with medical instructions: Never     Review of Systems  Constitutional:  Negative for appetite change and unexpected weight change.  HENT:  Negative for congestion, sinus pressure and sore throat.   Eyes:  Negative for pain and visual disturbance.  Respiratory:  Negative for chest tightness.        Breathing stable.   Cardiovascular:  Negative for chest pain, palpitations and leg swelling.  Gastrointestinal:  Negative for diarrhea, nausea and vomiting.       Lower abdominal/pelvic discomfort.   Genitourinary:  Negative for difficulty urinating and dysuria.  Musculoskeletal:  Negative for joint swelling and myalgias.  Skin:  Negative for color change and rash.  Neurological:  Negative for dizziness and headaches.  Hematological:  Negative for adenopathy. Does not bruise/bleed easily.  Psychiatric/Behavioral:  Negative for agitation and dysphoric mood.        Objective:     BP 120/76   Pulse 60   Temp 97.6 F (36.4 C) (Oral)   Ht 5' 7 (1.702 m)   Wt 211 lb 6.4 oz (95.9 kg)   LMP 12/27/2000   SpO2 99%   BMI 33.11 kg/m  Wt Readings from Last 3 Encounters:  11/11/24 211 lb 6.4 oz (95.9 kg)  09/12/24 210 lb  (95.3 kg)  08/15/24 207 lb (93.9 kg)    Physical Exam Vitals reviewed.  Constitutional:      General: She is not in acute distress.    Appearance: Normal appearance. She is well-developed.  HENT:     Head: Normocephalic and atraumatic.     Right Ear: External ear normal.     Left Ear: External ear normal.     Mouth/Throat:     Pharynx: No oropharyngeal exudate or posterior oropharyngeal erythema.  Eyes:     General: No scleral  icterus.       Right eye: No discharge.        Left eye: No discharge.     Conjunctiva/sclera: Conjunctivae normal.  Neck:     Thyroid : No thyromegaly.  Cardiovascular:     Rate and Rhythm: Normal rate and regular rhythm.  Pulmonary:     Effort: No tachypnea, accessory muscle usage or respiratory distress.     Breath sounds: Normal breath sounds. No decreased breath sounds or wheezing.  Chest:  Breasts:    Right: No inverted nipple, mass, nipple discharge or tenderness (no axillary adenopathy).     Left: No inverted nipple, mass, nipple discharge or tenderness (no axilarry adenopathy).  Abdominal:     General: Bowel sounds are normal.     Palpations: Abdomen is soft.     Comments: Tenderness to palpation - lower abdomen/pelvic region.   Musculoskeletal:        General: No swelling or tenderness.     Cervical back: Neck supple.  Lymphadenopathy:     Cervical: No cervical adenopathy.  Skin:    Findings: No erythema or rash.  Neurological:     Mental Status: She is alert and oriented to person, place, and time.  Psychiatric:        Mood and Affect: Mood normal.        Behavior: Behavior normal.         Outpatient Encounter Medications as of 11/11/2024  Medication Sig   AIRSUPRA  90-80 MCG/ACT AERO INHALE 2 PUFFS INTO THE LUNGS EVERY 4 HOURS AS NEEDED FOR SHORTNESS OF BREATH OR WHEEZING   BREO ELLIPTA  200-25 MCG/ACT AEPB INHALE 1 PUFF EVERY DAY   budesonide-glycopyrrolate-formoterol (BREZTRI  AEROSPHERE) 160-9-4.8 MCG/ACT AERO inhaler Inhale 2  puffs into the lungs in the morning and at bedtime.   cetirizine (ZYRTEC) 10 MG tablet Take by mouth.   ciclopirox  (PENLAC ) 8 % solution Apply topically at bedtime. Apply over nail and surrounding skin. Apply daily over previous coat. After seven (7) days, may remove with alcohol and continue cycle.   esomeprazole  (NEXIUM ) 40 MG capsule TAKE 1 CAPSULE(40 MG) BY MOUTH DAILY   metoprolol  succinate (TOPROL -XL) 25 MG 24 hr tablet Take 1 tablet (25 mg total) by mouth daily.   Ocrelizumab  (OCREVUS  IV) Inject into the vein.   rosuvastatin  (CRESTOR ) 5 MG tablet Take 1 tablet (5 mg total) by mouth every Monday, Wednesday, and Friday.   terbinafine  (LAMISIL ) 250 MG tablet Take 1 tablet (250 mg total) by mouth daily.   valsartan -hydrochlorothiazide  (DIOVAN -HCT) 320-25 MG tablet Take 1 tablet by mouth daily.   Vitamin D , Ergocalciferol , (DRISDOL ) 1.25 MG (50000 UNIT) CAPS capsule Take 1 capsule (50,000 Units total) by mouth every 7 (seven) days.   [DISCONTINUED] TEZSPIRE  210 MG/1. SOAJ INJECT 1 PEN UNDER THE SKIN EVERY 28 DAYS   No facility-administered encounter medications on file as of 11/11/2024.     Lab Results  Component Value Date   WBC 5.8 11/07/2024   HGB 12.8 11/07/2024   HCT 39.4 11/07/2024   PLT 275.0 11/07/2024   GLUCOSE 88 11/07/2024   CHOL 151 11/07/2024   TRIG 43.0 11/07/2024   HDL 88.10 11/07/2024   LDLCALC 54 11/07/2024   ALT 13 11/07/2024   AST 15 11/07/2024   NA 143 11/07/2024   K 4.4 11/07/2024   CL 102 11/07/2024   CREATININE 0.79 11/07/2024   BUN 11 11/07/2024   CO2 33 (H) 11/07/2024   TSH 1.13 03/03/2024    CT Chest High Resolution  Result Date: 08/12/2024 CLINICAL DATA:  Interstitial lung disease suspected EXAM: CT CHEST WITHOUT CONTRAST TECHNIQUE: Multidetector CT imaging of the chest was performed following the standard protocol without intravenous contrast. High resolution imaging of the lungs, as well as inspiratory and expiratory imaging, was performed.  RADIATION DOSE REDUCTION: This exam was performed according to the departmental dose-optimization program which includes automated exposure control, adjustment of the mA and/or kV according to patient size and/or use of iterative reconstruction technique. COMPARISON:  01/18/2021 FINDINGS: Cardiovascular: Scattered aortic atherosclerosis. Normal heart size. No pericardial effusion. Mediastinum/Nodes: No enlarged mediastinal, hilar, or axillary lymph nodes. Thyroid  gland, trachea, and esophagus demonstrate no significant findings. Lungs/Pleura: Mild diffuse bilateral bronchial wall thickening. No evidence of fibrotic interstitial lung disease. No significant air trapping on expiratory phase imaging. No pleural effusion or pneumothorax. Upper Abdomen: No acute abnormality. Musculoskeletal: No chest wall abnormality. No acute osseous findings. IMPRESSION: 1. No evidence of fibrotic interstitial lung disease. 2. Mild diffuse bilateral bronchial wall thickening, consistent with nonspecific infectious or inflammatory bronchitis. Aortic Atherosclerosis (ICD10-I70.0). Electronically Signed   By: Marolyn JONETTA Jaksch M.D.   On: 08/12/2024 21:15       Assessment & Plan:  Health care maintenance Assessment & Plan: Physical today 11/11/24.  PAP 05/12/20 - negative with negative HPV.  Mammogram 11/12/23 - Briads I. Schedule for f/u mammogram 12/11/24. Colonoscopy 03/16/21 -Hemorrhoids found on perianal exam. Diverticulosis in the sigmoid colon. The examination was otherwise normal.  Schedule for bone density.    Hypercholesterolemia Assessment & Plan: Continue crestor . Low cholesterol diet and exercise. Follow lipid panel.  Lab Results  Component Value Date   CHOL 151 11/07/2024   HDL 88.10 11/07/2024   LDLCALC 54 11/07/2024   TRIG 43.0 11/07/2024   CHOLHDL 2 11/07/2024     Orders: -     Lipid panel; Future -     Hepatic function panel; Future -     TSH; Future  Essential hypertension Assessment &  Plan: Continue metoprolol  and diovan /hctz.  Follow pressures.  Follow metabolic panel.   Orders: -     Basic metabolic panel with GFR; Future  Vitamin D  deficiency Assessment & Plan: Vitamin D  level low on recent check. Start prescription vitamin D  weekly as directed. Follow.   Orders: -     VITAMIN D  25 Hydroxy (Vit-D Deficiency, Fractures); Future  Estrogen deficiency -     DG Bone Density; Future  Encounter for screening mammogram for malignant neoplasm of breast -     3D Screening Mammogram, Left and Right; Future  Severe persistent asthma, unspecified whether complicated [facility]) Assessment & Plan:  Had f/u with Dr Tamea 09/12/24 - recommended trial breztri . (Using breo). Also to use albuterol  before using the flutter valve in the morning.    PSVT (paroxysmal supraventricular tachycardia) Assessment & Plan: Seeing cardiology. Had f/u with Dr Gollan 08/15/24 - monitor heart rhythm. She is noticing some intermittent episodes  of tachycardia - watch said afib. Cardiology had recommended monitoring as outlined. Hold on eliquis . No chest pain. Contact Dr Gollan regarding intermittent episodes. Discussed possible monitor and further medication/testing.    Multiple sclerosis Assessment & Plan: Saw neurology 09/30/24 - acute visit. Reported spasms in her distal RLE and tingling sensation right arm. Off ocrevus . Recommended updated MRI. Also treaed with medrol  dosepak and flexeril .    Gastroesophageal reflux disease, unspecified whether esophagitis present Assessment & Plan: Continue nexium .    Atrial flutter, unspecified type [facility]) Assessment & Plan: During recent hospitalization was found to  be in aflutter.  Aflutter resolved spontaneously.  CT - positive for LLL subsegmental pulmonary embolus. She was discharged with heart monitor.   Denies any increased heart rate or palpitations currently.  Saw cardiology 06/05/23. Echo showed normal LVEF, normal diastolic function, normal  EVSF, trivial valve disease, positive saline study after valsalva suggestive of patent foramen ovale. Paroxysmal SVT noted on recent heart monitor.  Recommended continuing toprol .  Planning f/u with cardiology.  Patent foramen ovale - Dr Perla reviewed - felt like small PFO - incidental finding.  No further w/up warranted.  Did not feel aspirin warranted after eliquis . Off eliquis .  Had f/u with Dr Gollan 08/15/24 - monitor heart rhythm. She is noticing some intermittent episodes  of tachycardia - watch said afib. Cardiology had recommended monitoring as outlined. Hold on eliquis  and continue to monitor. Discussed with her today. No chest pain.  Appears to be in SR today. D/w cardiology regarding further w/up and treatment.    Lower abdominal pain Assessment & Plan: Low abdominal/pelvic pain/pressure. Persistent. Will check CT scan (abdomen and pelvis) for further evaluation.   Orders: -     CT ABDOMEN PELVIS W CONTRAST; Future     Allena Hamilton, MD "

## 2024-11-14 ENCOUNTER — Other Ambulatory Visit: Payer: Self-pay | Admitting: Pulmonary Disease

## 2024-11-14 DIAGNOSIS — J4550 Severe persistent asthma, uncomplicated: Secondary | ICD-10-CM

## 2024-11-17 ENCOUNTER — Encounter: Payer: Self-pay | Admitting: Internal Medicine

## 2024-11-17 DIAGNOSIS — R109 Unspecified abdominal pain: Secondary | ICD-10-CM | POA: Insufficient documentation

## 2024-11-17 NOTE — Assessment & Plan Note (Signed)
 Vitamin D  level low on recent check. Start prescription vitamin D  weekly as directed. Follow.

## 2024-11-17 NOTE — Assessment & Plan Note (Signed)
"   Had f/u with Dr Tamea 09/12/24 - recommended trial breztri . (Using breo). Also to use albuterol  before using the flutter valve in the morning.  "

## 2024-11-17 NOTE — Assessment & Plan Note (Signed)
Continue metoprolol and diovan/hctz.  Follow pressures.  Follow metabolic panel.

## 2024-11-17 NOTE — Assessment & Plan Note (Signed)
 Continue nexium

## 2024-11-17 NOTE — Telephone Encounter (Signed)
 Refill sent for TEZSPIRE  to [facility]: [phone]  Dose: 210mg  Vamo every 28 days   Last OV: 09/12/24 Provider: Dr. Tamea  Next OV: 12/11/24  Aleck Puls, PharmD, BCPS Clinical Pharmacist  [facility]

## 2024-11-17 NOTE — Assessment & Plan Note (Signed)
 Saw neurology 09/30/24 - acute visit. Reported spasms in her distal RLE and tingling sensation right arm. Off ocrevus . Recommended updated MRI. Also treaed with medrol  dosepak and flexeril .

## 2024-11-17 NOTE — Assessment & Plan Note (Addendum)
 Low abdominal/pelvic pain/pressure. Persistent. Will check CT scan (abdomen and pelvis) for further evaluation.

## 2024-11-17 NOTE — Assessment & Plan Note (Signed)
 Continue crestor . Low cholesterol diet and exercise. Follow lipid panel.  Lab Results  Component Value Date   CHOL 151 11/07/2024   HDL 88.10 11/07/2024   LDLCALC 54 11/07/2024   TRIG 43.0 11/07/2024   CHOLHDL 2 11/07/2024

## 2024-11-17 NOTE — Assessment & Plan Note (Signed)
 During recent hospitalization was found to be in aflutter.  Aflutter resolved spontaneously.  CT - positive for LLL subsegmental pulmonary embolus. She was discharged with heart monitor.   Denies any increased heart rate or palpitations currently.  Saw cardiology 06/05/23. Echo showed normal LVEF, normal diastolic function, normal EVSF, trivial valve disease, positive saline study after valsalva suggestive of patent foramen ovale. Paroxysmal SVT noted on recent heart monitor.  Recommended continuing toprol .  Planning f/u with cardiology.  Patent foramen ovale - Dr Perla reviewed - felt like small PFO - incidental finding.  No further w/up warranted.  Did not feel aspirin warranted after eliquis . Off eliquis .  Had f/u with Dr Gollan 08/15/24 - monitor heart rhythm. She is noticing some intermittent episodes  of tachycardia - watch said afib. Cardiology had recommended monitoring as outlined. Hold on eliquis  and continue to monitor. Discussed with her today. No chest pain.  Appears to be in SR today. D/w cardiology regarding further w/up and treatment.

## 2024-11-17 NOTE — Assessment & Plan Note (Signed)
 Seeing cardiology. Had f/u with Dr Gollan 08/15/24 - monitor heart rhythm. She is noticing some intermittent episodes  of tachycardia - watch said afib. Cardiology had recommended monitoring as outlined. Hold on eliquis . No chest pain. Contact Dr Gollan regarding intermittent episodes. Discussed possible monitor and further medication/testing.

## 2024-11-19 ENCOUNTER — Other Ambulatory Visit

## 2024-11-19 DIAGNOSIS — G35D Multiple sclerosis, unspecified: Secondary | ICD-10-CM

## 2024-11-19 MED ORDER — GADOPICLENOL 0.5 MMOL/ML IV SOLN
10.0000 mL | Freq: Once | INTRAVENOUS | Status: AC | PRN
  Administered 2024-11-19: 10 mL via INTRAVENOUS

## 2024-11-27 ENCOUNTER — Ambulatory Visit
Admission: RE | Admit: 2024-11-27 | Discharge: 2024-11-27 | Disposition: A | Discharge status: Home / Self Care | Source: Ambulatory Visit | Attending: Internal Medicine | Admitting: Internal Medicine

## 2024-11-27 DIAGNOSIS — R1030 Lower abdominal pain, unspecified: Secondary | ICD-10-CM | POA: Diagnosis present

## 2024-11-27 MED ORDER — IOHEXOL 300 MG/ML  SOLN
100.0000 mL | Freq: Once | INTRAMUSCULAR | Status: AC | PRN
  Administered 2024-11-27: 100 mL via INTRAVENOUS

## 2024-12-01 ENCOUNTER — Ambulatory Visit: Payer: Self-pay | Admitting: Internal Medicine

## 2024-12-11 ENCOUNTER — Ambulatory Visit
Admission: RE | Admit: 2024-12-11 | Discharge: 2024-12-11 | Disposition: A | Discharge status: Home / Self Care | Source: Ambulatory Visit | Attending: Internal Medicine | Admitting: Internal Medicine

## 2024-12-11 ENCOUNTER — Ambulatory Visit: Admitting: Pulmonary Disease

## 2024-12-11 ENCOUNTER — Encounter: Payer: Self-pay | Admitting: Pulmonary Disease

## 2024-12-11 VITALS — BP 126/86 | HR 54 | Temp 97.7°F | Ht 67.0 in | Wt 206.0 lb

## 2024-12-11 DIAGNOSIS — D721 Eosinophilia, unspecified: Secondary | ICD-10-CM

## 2024-12-11 DIAGNOSIS — Z1231 Encounter for screening mammogram for malignant neoplasm of breast: Secondary | ICD-10-CM | POA: Diagnosis present

## 2024-12-11 DIAGNOSIS — J4550 Severe persistent asthma, uncomplicated: Secondary | ICD-10-CM | POA: Diagnosis not present

## 2024-12-11 DIAGNOSIS — E2839 Other primary ovarian failure: Secondary | ICD-10-CM | POA: Insufficient documentation

## 2024-12-11 DIAGNOSIS — Z86711 Personal history of pulmonary embolism: Secondary | ICD-10-CM

## 2024-12-11 DIAGNOSIS — G35D Multiple sclerosis, unspecified: Secondary | ICD-10-CM

## 2024-12-11 LAB — NITRIC OXIDE: Nitric Oxide: 65

## 2024-12-11 NOTE — Progress Notes (Signed)
 "  Subjective:    Patient ID: Jackie White, female    DOB: 04-Sep-1956, 69 y.o.   MRN: [id number]  Patient Care Team: Glendia Shad, MD as PCP - General (Internal Medicine) Tamea Dedra CROME, MD as Consulting Physician (Pulmonary Disease) Jasmine Oneil NOVAK, MD as Consulting Physician (Neurology)  Chief Complaint  Patient presents with   Asthma    No breathing problems. Using Breztri  twice daily. And Airsupra  as needed.     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  She also has multiple sclerosis and is on Ocrevus .  I last saw the patient on 12 September 2024. She was started on Tezspire  in June 2024.  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  No other admissions since then.  She has completed Eliquis  course.   HPI Discussed the use of AI scribe software for clinical note transcription with the patient, who gave verbal consent to proceed.  History of Present Illness   Jackie White is a 69 year old female with severe persistent asthma who presents for follow-up.  She experienced need for her skin inhaler yesterday, which she attributes to the weather overall however she has not had to use her rescue inhaler as much. She is planning to go on a cruise in late April and requests a prednisone  pack to manage potential asthma symptoms during the trip.  She is currently on Ocrevus , which she plans to continue every nine months to manage her multiple sclerosis. She considered an injection form but decided against it due to the administration method and duration, preferring the current infusion method despite the longer time commitment.     She does not endorse any fevers, chills or sweats.  No cough or sputum  production.  As noted her only need for rescue inhaler was yesterday related to the weather.  Review of Systems A 10 point review of systems was performed and it is as noted above otherwise negative.   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   History of COVID-19 03/09/2022   Eosinophilic asthma 11/17/2021   Eosinophilia 10/10/2021   History of colon polyps 03/10/2021   History of iron deficiency 03/10/2021   PUD (peptic ulcer disease) 03/10/2021   Aortic atherosclerosis 01/24/2021   Skin lesion 09/19/2020   Cough variant asthma 11/19/2019   Abnormal EKG 02/21/2019   Family history of coronary artery disease 02/21/2019   Cough 01/05/2019   Shortness of breath 01/05/2019   Wheezing 07/21/2018   Impingement syndrome of left shoulder region 05/01/2017   Osteoarthritis of knee 05/01/2017   Trochanteric bursitis of right hip 05/01/2017   Left shoulder pain 04/24/2017   Right knee pain 04/24/2017   Hemorrhoids 08/02/2016  Low back pain 05/17/2016   Right hip pain 05/17/2016   Chest tightness 03/01/2016   Rash 01/31/2015   Health care maintenance 01/31/2015   Left knee pain 10/12/2013   Pain in joint involving lower leg 10/12/2013   GERD (gastroesophageal reflux disease) 12/29/2012   Abnormal Pap smear of cervix 12/29/2012   Essential hypertension 12/25/2012   Anemia 12/25/2012   Degeneration of lumbar or lumbosacral intervertebral  disc 12/25/2012   Hypercholesterolemia 12/25/2012   Vitamin D  deficiency 12/25/2012   Multiple sclerosis 12/25/2012    Social History   Tobacco Use   Smoking status: Never   Smokeless tobacco: Never  Substance Use Topics   Alcohol use: Not Currently    Allergies[1]  Active Medications[2]  Immunization History  Administered Date(s) Administered   INFLUENZA, HIGH DOSE SEASONAL PF 09/08/2019, 08/30/2023, 09/12/2024   Influenza Split 09/26/2012, 09/24/2013   Influenza,inj,Quad PF,6+ Mos 09/14/2020, 11/10/2021   Influenza-Unspecified 08/27/2014, 09/21/2015, 09/20/2016, 09/10/2018, 09/20/2022   MMR 06/08/2004, 07/15/2004   PFIZER(Purple Top)SARS-COV-2 Vaccination 02/10/2020, 03/02/2020, 11/16/2020, 03/18/2021   Pfizer Covid-19 Vaccine Bivalent Booster 32yrs & up 08/25/2021   Pfizer(Comirnaty)Fall Seasonal Vaccine 12 years and older 08/30/2023   Zoster Recombinant(Shingrix) 09/11/2019, 11/12/2019        Objective:     Vitals:   12/11/24 1112  BP: 126/86  Pulse: (!) 54  Temp: 97.7 F (36.5 C)  Height: 5' 7 (1.702 m)  Weight: 206 lb (93.4 kg)  SpO2: 99%  TempSrc: Temporal  BMI (Calculated): 32.26   SpO2: 99 % GENERAL: Overweight woman, no acute distress.  Fully ambulatory.  No conversational dyspnea.  No respiratory distress. HEAD: Normocephalic, atraumatic.   EYES: Pupils equal, round, reactive to light.  No scleral icterus. MOUTH: Dentition intact, oral mucosa moist.  No thrush. NECK: Supple. No thyromegaly. Trachea midline. No JVD.  No adenopathy. PULMONARY: Good air entry bilaterally.  No wheezes noted.   CARDIOVASCULAR: S1 and S2. Regular rate and rhythm.  No rubs, murmurs or gallops heard. ABDOMEN: Benign. MUSCULOSKELETAL: No joint deformity, no clubbing, no edema. NEUROLOGIC: No focal deficit, no gait disturbance, speech is fluent. SKIN: Intact,warm,dry.  No rashes. PSYCH: Mood and behavior normal.  Lab Results  Component Value Date   NITRICOXIDE 65  12/11/2024  This result suggests high (>/= 50) Type 2 (T2) airway inflammation. This supports inhaled corticosteroid responsiveness and potential eligibility for biologic therapies targeting Type 2 inflammation. *Trend: 181>>133>>117>>154 >> 97>>99 >>90  >> 65 ppb       Assessment & Plan:     ICD-10-CM   1. Severe persistent asthma without complication (HCC)  J45.50 Nitric oxide     2. Multiple sclerosis  G35.D     3. Eosinophilia, unspecified type  D72.10     4. Hx of pulmonary embolus  Z86.711       Orders Placed This Encounter  Procedures   Nitric oxide    Discussion:    Severe persistent asthma Recent need for rescue inhaler likely triggered by weather changes. No wheezing on examination, indicating well-controlled asthma. Discussed alternative Ocrevus  administration methods, but current regimen preferred due to familiarity and effectiveness. - Continue current asthma management plan. - Overall nitric oxide  continues to downward trend though still elevated. - Continue Tezspire , Breztri  and as needed Airsupra  - Scheduled follow-up appointment in two months to assess asthma control before upcoming cruise.      Advised if symptoms do not improve or worsen, to please contact office for sooner follow up or seek emergency care.    I  spent 30 minutes of dedicated to the care of this patient on the date of this encounter to include pre-visit review of records, face-to-face time with the patient discussing conditions above, post visit ordering of testing, clinical documentation with the electronic health record, making appropriate referrals as documented, and communicating necessary findings to members of the patients care team.     C. Leita Sanders, MD Advanced Bronchoscopy PCCM [facility]    *This note was generated using voice recognition software/Dragon and/or AI transcription program.  Despite best efforts to proofread, errors can occur which can change  the meaning. Any transcriptional errors that result from this process are unintentional and may not be fully corrected at the time of dictation.     [1] No Known Allergies [2]  Current Meds  Medication Sig   AIRSUPRA  90-80 MCG/ACT AERO INHALE 2 PUFFS INTO THE LUNGS EVERY 4 HOURS AS NEEDED FOR SHORTNESS OF BREATH OR WHEEZING (Patient taking differently: Inhale 2 puffs into the lungs 2 (two) times daily.)   BREO ELLIPTA  200-25 MCG/ACT AEPB INHALE 1 PUFF EVERY DAY   cetirizine (ZYRTEC) 10 MG tablet Take by mouth. (Patient taking differently: Take by mouth daily as needed for allergies.)   cyclobenzaprine  (FLEXERIL ) 5 MG tablet Take 5 mg by mouth daily as needed for muscle spasms.   esomeprazole  (NEXIUM ) 40 MG capsule TAKE 1 CAPSULE(40 MG) BY MOUTH DAILY   metoprolol  succinate (TOPROL -XL) 25 MG 24 hr tablet Take 1 tablet (25 mg total) by mouth daily.   Ocrelizumab  (OCREVUS  IV) Inject into the vein.   rosuvastatin  (CRESTOR ) 5 MG tablet Take 1 tablet (5 mg total) by mouth every Monday, Wednesday, and Friday.   terbinafine  (LAMISIL ) 250 MG tablet Take 1 tablet (250 mg total) by mouth daily.   TEZSPIRE  210 MG/1. SOAJ INJECT 1 PEN UNDER THE SKIN EVERY 28 DAYS   valsartan -hydrochlorothiazide  (DIOVAN -HCT) 320-25 MG tablet Take 1 tablet by mouth daily.   Vitamin D , Ergocalciferol , (DRISDOL ) 1.25 MG (50000 UNIT) CAPS capsule Take 1 capsule (50,000 Units total) by mouth every 7 (seven) days.   "

## 2024-12-11 NOTE — Patient Instructions (Signed)
 VISIT SUMMARY:  Today, we discussed your recent asthma exacerbation and your ongoing treatment for multiple sclerosis. We reviewed your current medications and made plans to ensure you stay healthy during your upcoming cruise.  YOUR PLAN:  -SEVERE PERSISTENT ASTHMA: Severe persistent asthma is a chronic condition where the airways in your lungs are inflamed and narrowed, making it hard to breathe. Your recent exacerbation was likely triggered by weather changes. We will continue with your current asthma management plan and have provided you with a prednisone  pack to use if you experience any asthma symptoms during your cruise. We will reassess your asthma control in two months before your trip.  -MULTIPLE SCLEROSIS: Multiple sclerosis is a disease where the immune system attacks the protective covering of nerves, causing communication problems between your brain and the rest of your body. You are currently on Ocrevus , which you will continue every nine months. We discussed alternative administration methods, but you prefer the current infusion method due to its familiarity and effectiveness.  INSTRUCTIONS:  Please follow up in two months to assess your asthma control before your upcoming cruise.

## 2024-12-12 ENCOUNTER — Encounter: Payer: Self-pay | Admitting: Pulmonary Disease

## 2024-12-25 ENCOUNTER — Other Ambulatory Visit: Payer: Self-pay

## 2024-12-25 DIAGNOSIS — J4550 Severe persistent asthma, uncomplicated: Secondary | ICD-10-CM

## 2024-12-26 MED ORDER — TEZSPIRE 210 MG/1.91ML ~~LOC~~ SOAJ: 1.9100 mL | SUBCUTANEOUS | 1 refills | Status: AC

## 2024-12-26 MED ORDER — TEZSPIRE 210 MG/1.91ML ~~LOC~~ SOAJ: 1.9100 mL | SUBCUTANEOUS | 1 refills | Status: DC

## 2024-12-26 NOTE — Telephone Encounter (Signed)
 Refill sent for TEZSPIRE  to [facility]: [phone]  Dose: 210mg  [place] every 28 days   Last OV: 12/11/24 Provider: Dr. Tamea  Next OV: 02/09/25  **Pharmacy requested 90 days supply **  Jackie White, PharmD, BCPS Clinical Pharmacist  [facility]

## 2024-12-26 NOTE — Addendum Note (Signed)
 Addended by: Erdem Naas L on: 12/26/2024 12:42 PM   Modules accepted: Orders

## 2024-12-26 NOTE — Telephone Encounter (Signed)
 Edit - Rx routed to [facility]

## 2025-02-09 ENCOUNTER — Ambulatory Visit: Admitting: Pulmonary Disease

## 2025-02-27 ENCOUNTER — Other Ambulatory Visit

## 2025-03-03 ENCOUNTER — Ambulatory Visit: Admitting: Cardiovascular Disease

## 2025-03-03 ENCOUNTER — Ambulatory Visit: Admitting: Internal Medicine

## 2025-07-03 ENCOUNTER — Ambulatory Visit
